# Patient Record
Sex: Female | Born: 1955 | Race: White | Hispanic: No | Smoking: Current some day smoker
Health system: Southern US, Community
[De-identification: ages and names within clinical notes are randomized; demographics above are authoritative.]

## PROBLEM LIST (undated history)

## (undated) DIAGNOSIS — D649 Anemia, unspecified: Secondary | ICD-10-CM

## (undated) DIAGNOSIS — M1990 Unspecified osteoarthritis, unspecified site: Secondary | ICD-10-CM

## (undated) DIAGNOSIS — K219 Gastro-esophageal reflux disease without esophagitis: Secondary | ICD-10-CM

## (undated) DIAGNOSIS — E039 Hypothyroidism, unspecified: Secondary | ICD-10-CM

## (undated) DIAGNOSIS — F418 Other specified anxiety disorders: Secondary | ICD-10-CM

## (undated) DIAGNOSIS — I10 Essential (primary) hypertension: Secondary | ICD-10-CM

## (undated) DIAGNOSIS — E785 Hyperlipidemia, unspecified: Secondary | ICD-10-CM

## (undated) DIAGNOSIS — D126 Benign neoplasm of colon, unspecified: Secondary | ICD-10-CM

## (undated) DIAGNOSIS — F32A Depression, unspecified: Secondary | ICD-10-CM

## (undated) DIAGNOSIS — I739 Peripheral vascular disease, unspecified: Secondary | ICD-10-CM

## (undated) DIAGNOSIS — N189 Chronic kidney disease, unspecified: Secondary | ICD-10-CM

## (undated) DIAGNOSIS — E079 Disorder of thyroid, unspecified: Secondary | ICD-10-CM

## (undated) DIAGNOSIS — G5600 Carpal tunnel syndrome, unspecified upper limb: Secondary | ICD-10-CM

## (undated) DIAGNOSIS — E669 Obesity, unspecified: Secondary | ICD-10-CM

## (undated) DIAGNOSIS — I2510 Atherosclerotic heart disease of native coronary artery without angina pectoris: Secondary | ICD-10-CM

## (undated) DIAGNOSIS — F329 Major depressive disorder, single episode, unspecified: Secondary | ICD-10-CM

## (undated) DIAGNOSIS — R011 Cardiac murmur, unspecified: Secondary | ICD-10-CM

## (undated) DIAGNOSIS — Z8719 Personal history of other diseases of the digestive system: Secondary | ICD-10-CM

## (undated) DIAGNOSIS — K5730 Diverticulosis of large intestine without perforation or abscess without bleeding: Secondary | ICD-10-CM

## (undated) DIAGNOSIS — I779 Disorder of arteries and arterioles, unspecified: Secondary | ICD-10-CM

## (undated) HISTORY — DX: Other specified anxiety disorders: F41.8

## (undated) HISTORY — PX: CHOLECYSTECTOMY: SHX55

## (undated) HISTORY — PX: APPENDECTOMY: SHX54

## (undated) HISTORY — DX: Carpal tunnel syndrome, unspecified upper limb: G56.00

## (undated) HISTORY — DX: Depression, unspecified: F32.A

## (undated) HISTORY — DX: Diverticulosis of large intestine without perforation or abscess without bleeding: K57.30

## (undated) HISTORY — DX: Obesity, unspecified: E66.9

## (undated) HISTORY — DX: Essential (primary) hypertension: I10

## (undated) HISTORY — DX: Benign neoplasm of colon, unspecified: D12.6

## (undated) HISTORY — DX: Atherosclerotic heart disease of native coronary artery without angina pectoris: I25.10

## (undated) HISTORY — PX: ABDOMINAL HYSTERECTOMY: SHX81

## (undated) HISTORY — DX: Peripheral vascular disease, unspecified: I73.9

## (undated) HISTORY — DX: Hyperlipidemia, unspecified: E78.5

## (undated) HISTORY — DX: Gastro-esophageal reflux disease without esophagitis: K21.9

## (undated) HISTORY — DX: Anemia, unspecified: D64.9

## (undated) HISTORY — DX: Disorder of thyroid, unspecified: E07.9

## (undated) HISTORY — DX: Chronic kidney disease, unspecified: N18.9

## (undated) HISTORY — DX: Major depressive disorder, single episode, unspecified: F32.9

---

## 1998-10-12 ENCOUNTER — Other Ambulatory Visit: Admission: RE | Admit: 1998-10-12 | Discharge: 1998-10-12 | Payer: Self-pay | Admitting: *Deleted

## 1999-10-25 ENCOUNTER — Encounter: Admission: RE | Admit: 1999-10-25 | Discharge: 1999-10-25 | Payer: Self-pay | Admitting: *Deleted

## 1999-10-25 ENCOUNTER — Encounter: Payer: Self-pay | Admitting: *Deleted

## 2000-10-26 ENCOUNTER — Encounter: Admission: RE | Admit: 2000-10-26 | Discharge: 2000-10-26 | Payer: Self-pay | Admitting: *Deleted

## 2000-10-26 ENCOUNTER — Encounter: Payer: Self-pay | Admitting: *Deleted

## 2001-01-15 ENCOUNTER — Ambulatory Visit (HOSPITAL_COMMUNITY): Admission: RE | Admit: 2001-01-15 | Discharge: 2001-01-15 | Payer: Self-pay | Admitting: Gynecology

## 2001-01-31 ENCOUNTER — Encounter: Admission: RE | Admit: 2001-01-31 | Discharge: 2001-01-31 | Payer: Self-pay | Admitting: *Deleted

## 2001-01-31 ENCOUNTER — Encounter: Payer: Self-pay | Admitting: *Deleted

## 2001-09-10 ENCOUNTER — Other Ambulatory Visit: Admission: RE | Admit: 2001-09-10 | Discharge: 2001-09-10 | Payer: Self-pay | Admitting: *Deleted

## 2001-12-21 ENCOUNTER — Encounter: Admission: RE | Admit: 2001-12-21 | Discharge: 2001-12-21 | Payer: Self-pay | Admitting: *Deleted

## 2001-12-21 ENCOUNTER — Encounter: Payer: Self-pay | Admitting: *Deleted

## 2001-12-28 ENCOUNTER — Encounter: Admission: RE | Admit: 2001-12-28 | Discharge: 2001-12-28 | Payer: Self-pay | Admitting: *Deleted

## 2001-12-28 ENCOUNTER — Encounter: Payer: Self-pay | Admitting: *Deleted

## 2002-01-08 ENCOUNTER — Encounter: Payer: Self-pay | Admitting: Orthopedic Surgery

## 2002-01-15 ENCOUNTER — Ambulatory Visit (HOSPITAL_COMMUNITY): Admission: RE | Admit: 2002-01-15 | Discharge: 2002-01-15 | Payer: Self-pay | Admitting: Orthopedic Surgery

## 2002-06-11 ENCOUNTER — Ambulatory Visit (HOSPITAL_COMMUNITY): Admission: RE | Admit: 2002-06-11 | Discharge: 2002-06-12 | Payer: Self-pay | Admitting: Cardiovascular Disease

## 2002-07-02 ENCOUNTER — Ambulatory Visit (HOSPITAL_COMMUNITY): Admission: RE | Admit: 2002-07-02 | Discharge: 2002-07-02 | Payer: Self-pay | Admitting: Cardiovascular Disease

## 2002-07-05 ENCOUNTER — Ambulatory Visit (HOSPITAL_COMMUNITY): Admission: RE | Admit: 2002-07-05 | Discharge: 2002-07-05 | Payer: Self-pay | Admitting: Cardiovascular Disease

## 2002-07-11 HISTORY — PX: CORONARY ARTERY BYPASS GRAFT: SHX141

## 2002-08-12 ENCOUNTER — Encounter: Payer: Self-pay | Admitting: *Deleted

## 2002-08-12 ENCOUNTER — Inpatient Hospital Stay (HOSPITAL_COMMUNITY): Admission: EM | Admit: 2002-08-12 | Discharge: 2002-08-26 | Payer: Self-pay | Admitting: Podiatry

## 2002-08-14 ENCOUNTER — Encounter: Payer: Self-pay | Admitting: Cardiology

## 2002-08-20 ENCOUNTER — Encounter: Payer: Self-pay | Admitting: Cardiothoracic Surgery

## 2002-08-21 ENCOUNTER — Encounter: Payer: Self-pay | Admitting: Cardiothoracic Surgery

## 2002-08-22 ENCOUNTER — Encounter: Payer: Self-pay | Admitting: Cardiothoracic Surgery

## 2002-08-23 ENCOUNTER — Encounter: Payer: Self-pay | Admitting: Cardiothoracic Surgery

## 2003-10-02 ENCOUNTER — Other Ambulatory Visit: Admission: RE | Admit: 2003-10-02 | Discharge: 2003-10-02 | Payer: Self-pay | Admitting: Family Medicine

## 2004-05-07 ENCOUNTER — Ambulatory Visit (HOSPITAL_COMMUNITY): Admission: RE | Admit: 2004-05-07 | Discharge: 2004-05-07 | Payer: Self-pay | Admitting: Cardiology

## 2004-12-17 ENCOUNTER — Other Ambulatory Visit: Admission: RE | Admit: 2004-12-17 | Discharge: 2004-12-17 | Payer: Self-pay | Admitting: Family Medicine

## 2004-12-23 ENCOUNTER — Ambulatory Visit: Payer: Self-pay | Admitting: Internal Medicine

## 2005-01-03 ENCOUNTER — Encounter: Admission: RE | Admit: 2005-01-03 | Discharge: 2005-01-03 | Payer: Self-pay | Admitting: Family Medicine

## 2005-01-05 ENCOUNTER — Ambulatory Visit: Payer: Self-pay | Admitting: Cardiology

## 2005-01-12 ENCOUNTER — Ambulatory Visit: Payer: Self-pay | Admitting: Cardiology

## 2005-02-08 ENCOUNTER — Encounter: Payer: Self-pay | Admitting: Internal Medicine

## 2005-02-08 ENCOUNTER — Encounter (INDEPENDENT_AMBULATORY_CARE_PROVIDER_SITE_OTHER): Payer: Self-pay | Admitting: Specialist

## 2005-02-08 ENCOUNTER — Ambulatory Visit: Payer: Self-pay | Admitting: Internal Medicine

## 2005-02-08 ENCOUNTER — Ambulatory Visit: Payer: Self-pay | Admitting: Cardiology

## 2005-02-14 ENCOUNTER — Ambulatory Visit: Payer: Self-pay | Admitting: Cardiology

## 2005-03-07 ENCOUNTER — Ambulatory Visit: Payer: Self-pay | Admitting: Cardiology

## 2005-04-12 ENCOUNTER — Ambulatory Visit: Payer: Self-pay | Admitting: Internal Medicine

## 2005-06-16 ENCOUNTER — Ambulatory Visit: Payer: Self-pay | Admitting: Cardiology

## 2005-10-26 ENCOUNTER — Ambulatory Visit: Payer: Self-pay | Admitting: Cardiology

## 2006-01-27 ENCOUNTER — Encounter: Admission: RE | Admit: 2006-01-27 | Discharge: 2006-01-27 | Payer: Self-pay | Admitting: Family Medicine

## 2007-02-16 ENCOUNTER — Encounter: Admission: RE | Admit: 2007-02-16 | Discharge: 2007-02-16 | Payer: Self-pay | Admitting: Family Medicine

## 2007-05-07 ENCOUNTER — Encounter: Admission: RE | Admit: 2007-05-07 | Discharge: 2007-05-07 | Payer: Self-pay | Admitting: Family Medicine

## 2007-06-26 ENCOUNTER — Ambulatory Visit: Payer: Self-pay | Admitting: Cardiology

## 2007-06-28 ENCOUNTER — Ambulatory Visit: Payer: Self-pay

## 2008-02-18 ENCOUNTER — Encounter: Admission: RE | Admit: 2008-02-18 | Discharge: 2008-02-18 | Payer: Self-pay | Admitting: Family Medicine

## 2008-03-10 DIAGNOSIS — I2510 Atherosclerotic heart disease of native coronary artery without angina pectoris: Secondary | ICD-10-CM

## 2008-03-10 DIAGNOSIS — F341 Dysthymic disorder: Secondary | ICD-10-CM

## 2008-03-10 DIAGNOSIS — I739 Peripheral vascular disease, unspecified: Secondary | ICD-10-CM | POA: Insufficient documentation

## 2008-03-10 DIAGNOSIS — D126 Benign neoplasm of colon, unspecified: Secondary | ICD-10-CM

## 2008-03-10 DIAGNOSIS — K5730 Diverticulosis of large intestine without perforation or abscess without bleeding: Secondary | ICD-10-CM

## 2008-03-10 DIAGNOSIS — E119 Type 2 diabetes mellitus without complications: Secondary | ICD-10-CM

## 2008-03-10 DIAGNOSIS — E785 Hyperlipidemia, unspecified: Secondary | ICD-10-CM | POA: Insufficient documentation

## 2008-03-11 ENCOUNTER — Ambulatory Visit: Payer: Self-pay | Admitting: Internal Medicine

## 2008-03-11 DIAGNOSIS — K219 Gastro-esophageal reflux disease without esophagitis: Secondary | ICD-10-CM | POA: Insufficient documentation

## 2008-03-25 ENCOUNTER — Encounter: Payer: Self-pay | Admitting: Internal Medicine

## 2008-03-25 ENCOUNTER — Ambulatory Visit: Payer: Self-pay | Admitting: Internal Medicine

## 2008-03-26 ENCOUNTER — Encounter: Payer: Self-pay | Admitting: Internal Medicine

## 2008-10-02 DIAGNOSIS — I10 Essential (primary) hypertension: Secondary | ICD-10-CM

## 2008-10-03 ENCOUNTER — Ambulatory Visit: Payer: Self-pay | Admitting: Cardiology

## 2008-10-08 ENCOUNTER — Ambulatory Visit: Payer: Self-pay

## 2009-03-03 ENCOUNTER — Encounter: Payer: Self-pay | Admitting: Cardiology

## 2009-03-06 ENCOUNTER — Emergency Department (HOSPITAL_COMMUNITY): Admission: EM | Admit: 2009-03-06 | Discharge: 2009-03-06 | Payer: Self-pay | Admitting: Emergency Medicine

## 2009-03-31 ENCOUNTER — Encounter: Admission: RE | Admit: 2009-03-31 | Discharge: 2009-03-31 | Payer: Self-pay | Admitting: Family Medicine

## 2009-05-07 ENCOUNTER — Encounter: Admission: RE | Admit: 2009-05-07 | Discharge: 2009-05-07 | Payer: Self-pay | Admitting: Family Medicine

## 2009-08-31 ENCOUNTER — Telehealth: Payer: Self-pay | Admitting: Cardiology

## 2009-10-08 ENCOUNTER — Encounter: Payer: Self-pay | Admitting: Cardiology

## 2009-10-08 DIAGNOSIS — I6523 Occlusion and stenosis of bilateral carotid arteries: Secondary | ICD-10-CM | POA: Insufficient documentation

## 2009-10-09 ENCOUNTER — Encounter: Payer: Self-pay | Admitting: Cardiology

## 2009-10-09 ENCOUNTER — Ambulatory Visit: Payer: Self-pay

## 2009-10-12 ENCOUNTER — Telehealth (INDEPENDENT_AMBULATORY_CARE_PROVIDER_SITE_OTHER): Payer: Self-pay | Admitting: *Deleted

## 2009-10-27 ENCOUNTER — Ambulatory Visit: Payer: Self-pay | Admitting: Cardiology

## 2009-10-27 DIAGNOSIS — N259 Disorder resulting from impaired renal tubular function, unspecified: Secondary | ICD-10-CM

## 2009-11-18 ENCOUNTER — Telehealth: Payer: Self-pay | Admitting: Cardiology

## 2010-01-13 ENCOUNTER — Telehealth (INDEPENDENT_AMBULATORY_CARE_PROVIDER_SITE_OTHER): Payer: Self-pay | Admitting: *Deleted

## 2010-05-07 ENCOUNTER — Encounter: Admission: RE | Admit: 2010-05-07 | Discharge: 2010-05-07 | Payer: Self-pay | Admitting: Family Medicine

## 2010-05-11 ENCOUNTER — Encounter: Payer: Self-pay | Admitting: Cardiology

## 2010-08-10 NOTE — Assessment & Plan Note (Signed)
Summary: 1 yr/dmp      Allergies Added: NKDA  Visit Type:  Follow-up Primary Provider:  Myna Hidalgo Mozzicchi,MD  CC:  no complaints.  History of Present Illness: [name] is a pleasant female who has a history of coronary artery status post coronary bypass and graft in 2004.  Her last Myoview was performed on June 28, 2007.  At that time, her ejection fraction was 66%.  The perfusion was normal.  Carotid Dopplers in April of 2011 showed 40-59% bilateral stenosis. Followup was recommended in one year. Patient also has renal insufficiency. A recent LDL in March of 2011 was 106. I last saw her in March of 2010. Since then the patient has dyspnea with more extreme activities but not with routine activities. It is relieved with rest. It is not associated with chest pain. There is no orthopnea, PND or pedal edema. There is no syncope or palpitations. There is no exertional chest pain.   Current Medications (verified): 1)  Lipitor 80 Mg Tabs (Atorvastatin Calcium) .Marland Kitchen.. 1 Tablet By Mouth Once Daily 2)  Benicar 40 Mg Tabs (Olmesartan Medoxomil) .Marland Kitchen.. 1 Tablet By Mouth Once Daily 3)  Fenofibrate Micronized 200 Mg Caps (Fenofibrate Micronized) .Marland Kitchen.. 1 Tablet By Mouth Once Daily 4)  Omeprazole 20 Mg Cpdr (Omeprazole) .... One Tablet By Mouth Once Daily 5)  Aspirin 81 Mg  Tabs (Aspirin) .... One Tablet By Mouth Once Daily 6)  Metoprolol Tartrate 50 Mg Tabs (Metoprolol Tartrate) .... Take One Tablet By Mouth Twice A Day  Allergies (verified): No Known Drug Allergies  Past History:  Past Medical History: Reviewed history from 10/02/2008 and no changes required. Current Problems:  HYPERTENSION (ICD-401.9) GERD (ICD-530.81) DIABETES MELLITUS-TYPE II (ICD-250.00) DIVERTICULOSIS, COLON (ICD-562.10) COLONIC POLYPS (ICD-211.3) ANXIETY DEPRESSION (ICD-300.4) PVD (ICD-443.9) DIABETES MELLITUS (ICD-250.00) HYPERLIPIDEMIA (ICD-272.4) CAD (ICD-414.00)   Carpal tunnel syndrome.  Past Surgical  History: Reviewed history from 10/02/2008 and no changes required. cholecystectomy hysterectomy CABG x 4 appendectomy knee surgery.  Social History: Reviewed history from 10/02/2008 and no changes required. Patient is a former smoker.  Alcohol Use - no Daily Caffeine Use Illicit Drug Use - no Widowed   Review of Systems       Some problems with leg cramping but no fevers or chills, productive cough, hemoptysis, dysphasia, odynophagia, melena, hematochezia, dysuria, hematuria, rash, seizure activity, orthopnea, PND, pedal edema. Remaining systems are negative.   Vital Signs:  Patient profile:   55 year old female Height:      65 inches Weight:      223 pounds BMI:     37.24 Pulse rate:   68 / minute BP sitting:   116 / 64  (left arm)  Vitals Entered By: Lubertha Basque, CNA (October 27, 2009 9:18 AM)  Physical Exam  General:  Well-developed well-nourished in no acute distress.  Skin is warm and dry.  HEENT is normal.  Neck is supple. No thyromegaly. bilateral carotid bruits Chest is clear to auscultation with normal expansion.  Cardiovascular exam is regular rate and rhythm.  Abdominal exam nontender or distended. No masses palpated. Extremities show no edema. neuro grossly intact    EKG  Procedure date:  10/27/2009  Findings:      Normal sinus rhythm at a rate of 68. Axis normal. RV conduction delay. Nonspecific ST changes.  Impression & Recommendations:  Problem # 1:  CAROTID ARTERY DISEASE (ICD-433.10) Continue aspirin and statin. Followup carotid Dopplers April 2012. Her updated medication list for this problem includes:  Aspirin 81 Mg Tabs (Aspirin) ..... One tablet by mouth once daily  Problem # 2:  HYPERTENSION (ICD-401.9) Blood pressure controlled on present medications. Will continue. Renal function and potassium monitored by primary care and nephrology. Her updated medication list for this problem includes:    Benicar 40 Mg Tabs (Olmesartan  medoxomil) .Marland Kitchen... 1 tablet by mouth once daily    Aspirin 81 Mg Tabs (Aspirin) ..... One tablet by mouth once daily    Metoprolol Tartrate 50 Mg Tabs (Metoprolol tartrate) .Marland Kitchen... Take one tablet by mouth twice a day  Problem # 3:  DIABETES MELLITUS-TYPE II (ICD-250.00) Management per primary care. Her updated medication list for this problem includes:    Benicar 40 Mg Tabs (Olmesartan medoxomil) .Marland Kitchen... 1 tablet by mouth once daily    Aspirin 81 Mg Tabs (Aspirin) ..... One tablet by mouth once daily  Problem # 4:  HYPERLIPIDEMIA (ICD-272.4) Continue present medications. Lipids and liver monitor by primary care. I discussed the importance of diet. Her updated medication list for this problem includes:    Lipitor 80 Mg Tabs (Atorvastatin calcium) .Marland Kitchen... 1 tablet by mouth once daily    Fenofibrate Micronized 200 Mg Caps (Fenofibrate micronized) .Marland Kitchen... 1 tablet by mouth once daily  Problem # 5:  CAD (ICD-414.00) Continue aspirin, beta blocker and statin. Continue risk factor modification. Followup Myoview in one year when she returns. Her updated medication list for this problem includes:    Aspirin 81 Mg Tabs (Aspirin) ..... One tablet by mouth once daily    Metoprolol Tartrate 50 Mg Tabs (Metoprolol tartrate) .Marland Kitchen... Take one tablet by mouth twice a day  Problem # 6:  PVD (ICD-443.9) Continue aspirin and statin.  Problem # 7:  RENAL INSUFFICIENCY (ICD-588.9) Followed by nephrology.  Patient Instructions: 1)  Your physician recommends that you schedule a follow-up appointment in: [place]

## 2010-08-10 NOTE — Progress Notes (Signed)
Summary: ROI request  Records request received from the fax machine. Forwarded to [facility] for processing. 

## 2010-08-10 NOTE — Progress Notes (Signed)
Summary: refill   Phone Note Refill Request   Refills Requested: Medication #1:  FENOFIBRATE MICRONIZED 200 MG CAPS 1 tablet by mouth once daily   [facility] [address]   Method Requested: Fax to [place] Initial call taken by: Darnell Level,  Nov 18, 2009 3:28 PM    Prescriptions: FENOFIBRATE MICRONIZED 200 MG CAPS (FENOFIBRATE MICRONIZED) 1 tablet by mouth once daily  #90 x 3   Entered by:   Burnett Kanaris   Authorized by:   Colin Mulders, MD, [facility]   Signed by:   Burnett Kanaris on 11/18/2009   Method used:   [facility]        [phone]       [address]       [place], [place]  [address]       Ph: [phone]       Fax: [phone]   RxID:   [id number]

## 2010-08-10 NOTE — Progress Notes (Signed)
Summary: refill meds  Medications Added METOPROLOL TARTRATE 50 MG TABS (METOPROLOL TARTRATE) Take one tablet by mouth twice a day       Phone Note Refill Request Call back at Home Phone [phone] Message from:  Patient on August 31, 2009 3:39 PM  metoprolol 50 mg twice a day, [facility] [address]    Method Requested: Fax to [place] Initial call taken by: Neil Crouch,  August 31, 2009 3:40 PM    New/Updated Medications: METOPROLOL TARTRATE 50 MG TABS (METOPROLOL TARTRATE) Take one tablet by mouth twice a day Prescriptions: METOPROLOL TARTRATE 50 MG TABS (METOPROLOL TARTRATE) Take one tablet by mouth twice a day  #60 x 12   Entered by:   Burnett Kanaris   Authorized by:   Colin Mulders, MD, [facility]   Signed by:   Burnett Kanaris on 09/01/2009   Method used:   [facility]        [facility] [place] [phone][facility] (retail[facility]       [address]       [place], [place]  [address]       Ph: [phone]       Fax: [phone]   RxID:   [id number]

## 2010-08-10 NOTE — Miscellaneous (Signed)
Summary: Orders Update  Clinical Lists Changes  Problems: Added new problem of CAROTID ARTERY DISEASE (ICD-433.10) Orders: Added new Test order of Carotid Duplex (Carotid Duplex) - Signed 

## 2010-08-10 NOTE — Progress Notes (Signed)
   Walk in Patient Form Recieved " Pt left Dept Of  Transportation papers to be completed" sent to [place]  January 13, 2010 8:47 AM

## 2010-08-10 NOTE — Letter (Signed)
Summary: [place] - Walk-In Pt Form  [place] - Walk-In Pt Form   Imported By: Marilynne Drivers 06/08/2010 15:05:27  _____________________________________________________________________  External Attachment:    Type:   Image     Comment:   External Document

## 2010-08-17 ENCOUNTER — Ambulatory Visit (INDEPENDENT_AMBULATORY_CARE_PROVIDER_SITE_OTHER): Payer: BC Managed Care – PPO | Admitting: Family Medicine

## 2010-08-17 ENCOUNTER — Encounter: Payer: Self-pay | Admitting: Family Medicine

## 2010-08-17 DIAGNOSIS — E119 Type 2 diabetes mellitus without complications: Secondary | ICD-10-CM

## 2010-08-17 DIAGNOSIS — I6529 Occlusion and stenosis of unspecified carotid artery: Secondary | ICD-10-CM

## 2010-08-17 DIAGNOSIS — J209 Acute bronchitis, unspecified: Secondary | ICD-10-CM

## 2010-08-17 DIAGNOSIS — J019 Acute sinusitis, unspecified: Secondary | ICD-10-CM

## 2010-08-17 DIAGNOSIS — N259 Disorder resulting from impaired renal tubular function, unspecified: Secondary | ICD-10-CM

## 2010-08-21 ENCOUNTER — Telehealth (INDEPENDENT_AMBULATORY_CARE_PROVIDER_SITE_OTHER): Payer: Self-pay | Admitting: *Deleted

## 2010-08-26 NOTE — Progress Notes (Signed)
  Phone Note Call from Patient   Caller: Patient Summary of Call: Patient came to the store to shop and she stopped by to see if Dr. Wynetta Emery could prescribe her something else other than the Delsym prescription he prescribed to her on August 17, 2010 when she came to see him in the office.  You would like for you to [facility] [address].  The number for the pharmacy is [phone].  You can contact the patient at [phone] Initial call taken by: Boykin Reaper,  August 21, 2010 3:08 PM  Follow-up for Phone Call        OK we will try something different this time but let the patient know that if there is no improvement in 1-2 days she will need to return for a new office visit for an evaluation or see her primary care provider for an evaluation.   Follow-up by: Irwin Brakeman MD,  August 21, 2010 5:23 PM    New/Updated Medications: BENZONATATE 100 MG CAPS (BENZONATATE) take 1 by mouth three times a day as needed for severe cough. Swallow whole, don't chew, May cause drowsiness. Prescriptions: BENZONATATE 100 MG CAPS (BENZONATATE) take 1 by mouth three times a day as needed for severe cough. Swallow whole, don't chew, May cause drowsiness.  #12 x 0   Entered and Authorized by:   Irwin Brakeman MD   Signed by:   Irwin Brakeman MD on 08/21/2010   Method used:   [facility]        [facility] [place] [id number] (retail)       [address]       [place], [place]  [address]       Ph: [phone]       Fax: [phone]   RxID:   [id number]

## 2010-08-26 NOTE — Letter (Signed)
Summary: Out of Work  [facility]   [place], [place] [address]   Phone: [phone]  Fax: [phone]    August 17, 2010   Employee:  JENCIE CUADRAS    To Whom It May Concern:   For Medical reasons, please excuse the above named employee from work for the following dates:  Start:   August 16, 2010    End:   August 23, 2010  If you need additional information, please feel free to contact our office.         Sincerely,    Irwin Brakeman MD

## 2010-08-26 NOTE — Assessment & Plan Note (Signed)
Summary: BRONCHITIS INFECTION/EVM   Vital Signs:  Patient Profile:   55 Years Old Female CC:      Cold & URI symptoms Height:     63.5 inches Weight:      230 pounds BMI:     38.41 O2 Sat:      97 % O2 treatment:    Room Air Temp:     98.1 degrees F oral Pulse rate:   70 / minute Pulse rhythm:   regular Resp:     20 per minute BP sitting:   126 / 60  (right arm)  Pt. in pain?   no  Vitals Entered By: Brent Bulla EMT-P (August 17, 2010 11:31 AM)              Is Patient Diabetic? No      Current Allergies: No known allergies History of Present Illness History from: patient Chief Complaint: Cold & URI symptoms History of Present Illness: The patient presented today because she has had some cough and congestion and wheezing for 1 week.  She is having some SOB and thick yellow, green chest and nasal congestion.  She has CAD s/p CABG, T2DM, Chronic Renal Failure, and reports that she had a flu and pneumonia vaccine.  She says that she is wheezing throughout the day, not saying worse at the night.  She is reporting that she had a blood glucose of 116 this morning.  She denies CP.  She denies changes in weight and vomiting.  No rash reported.  She says that she has taken the Zpack in the past and tolerated it well.  She has bronchitis conditions and has a home nebulizer at home but has not been using it.    REVIEW OF SYSTEMS Constitutional Symptoms      Denies fever, chills, night sweats, weight loss, weight gain, and fatigue.  Eyes       Denies change in vision, eye pain, eye discharge, glasses, contact lenses, and eye surgery. Ear/Nose/Throat/Mouth       Complains of frequent runny nose, sinus problems, and hoarseness.      Denies hearing loss/aids, change in hearing, ear pain, ear discharge, dizziness, frequent nose bleeds, sore throat, and tooth pain or bleeding.  Respiratory       Complains of productive cough, wheezing, shortness of breath, asthma, and bronchitis.       Denies dry cough and emphysema/COPD.      Comments: Colored Sputum Cardiovascular       Denies murmurs, chest pain, and tires easily with exhertion.    Gastrointestinal       Denies stomach pain, nausea/vomiting, diarrhea, constipation, blood in bowel movements, and indigestion. Genitourniary       Denies painful urination, blood or discharge from vagina, kidney stones, and loss of urinary control. Neurological       Denies paralysis, seizures, and fainting/blackouts. Musculoskeletal       Denies muscle pain, joint pain, joint stiffness, decreased range of motion, redness, swelling, muscle weakness, and gout.  Skin       Denies bruising, unusual mles/lumps or sores, and hair/skin or nail changes.  Psych       Denies mood changes, temper/anger issues, anxiety/stress, speech problems, depression, and sleep problems.  Past History:  Past Surgical History: Last updated: 10/02/2008 cholecystectomy hysterectomy CABG x 4 appendectomy knee surgery.  Family History: Last updated: 08/17/2010 Family History of Diabetes Mellitus: Mother Family History of Heart Disease: Father  Social History: Last updated: 08/17/2010  Patient is a former long-time smoker.  Alcohol Use - no Daily Caffeine Use Illicit Drug Use - no Widowed   Risk Factors: Smoking Status: quit (03/11/2008)  Past Medical History: Current Problems:  HYPERTENSION (ICD-401.9) GERD (ICD-530.81) Chronic Renal Insufficiency EGFR - 20  DIABETES MELLITUS-TYPE II (ICD-250.00), Non insulin requiring DIVERTICULOSIS, COLON (ICD-562.10) COLONIC POLYPS (ICD-211.3) ANXIETY DEPRESSION (ICD-300.4) PVD (ICD-443.9) DIABETES MELLITUS (ICD-250.00) HYPERLIPIDEMIA (ICD-272.4) CAD (ICD-414.00) s/p CABG   Carpal tunnel syndrome.  Family History: Family History of Diabetes Mellitus: Mother Family History of Heart Disease: Father  Social History: Patient is a former long-time smoker.  Alcohol Use - no Daily Caffeine  Use Illicit Drug Use - no Widowed  Physical Exam General appearance: well developed, well nourished, no acute distress Head: normocephalic, atraumatic Eyes: conjunctivae and lids normal Pupils: equal, round, reactive to light Ears: normal, no lesions or deformities Nasal: swollen nasal turbinates, mucosa pink, nonedematous, no septal deviation Oral/Pharynx: tongue normal, posterior pharynx without erythema or exudate Neck: neck supple,  trachea midline, no masses Chest/Lungs: no rales, bilateral expiratory wheezes, no rhonchi, breath sounds equal without effort Heart: regular rate and  rhythm, no murmur Abdomen: soft, non-tender without obvious organomegaly Extremities: normal extremities Neurological: grossly intact and non-focal Skin: no obvious rashes or lesions MSE: oriented to time, place, and person Assessment  Assessed DIABETES MELLITUS-TYPE II as unchanged - Irwin Brakeman MD Assessed RENAL INSUFFICIENCY as unchanged - Irwin Brakeman MD Assessed CAROTID ARTERY DISEASE as unchanged - Irwin Brakeman MD New Problems: ACUTE SINUSITIS, UNSPECIFIED (ICD-461.9) ACUTE BRONCHITIS (ICD-466.0)   Patient Education: Patient and/or caregiver instructed in the following: rest, fluids. The risks, benefits and possible side effects were clearly explained and discussed with the patient.  The patient verbalized clear understanding.  The patient was given instructions to return if symptoms don't improve, worsen or new changes develop.  If it is not during clinic hours and the patient cannot get back to this clinic then the patient was told to seek medical care at an available urgent care or emergency department.  The patient verbalized understanding.   Demonstrates willingness to comply.  Plan New Medications/Changes: DELSYM 30 MG/5ML LQCR (DEXTROMETHORPHAN POLISTIREX) take 1 teaspoon by mouth every 12 hours as needed for coughing  #50 mL x 0, 08/17/2010, Tessi Eustache  MD AZITHROMYCIN 250 MG TABS (AZITHROMYCIN) take 2 tabs by mouth on day 1, then take 1 tab by mouth daily until completed  #6 x 0, 08/17/2010, Tauheedah Bok MD DELSYM 30 MG/5ML LQCR (DEXTROMETHORPHAN POLISTIREX) take 1 teaspoon by mouth every 12 hours as needed for coughing  #50 mL x 0, 08/17/2010, Larin Depaoli MD AZITHROMYCIN 250 MG TABS (AZITHROMYCIN) take 2 tabs by mouth on day 1, then take 1 tab by mouth daily until completed  #6 x 0, 08/17/2010, Ailey Wessling MD  Follow Up: Follow up in 2-3 days if no improvement, Follow up on an as needed basis, Follow up with Primary Physician  The patient and/or caregiver has been counseled thoroughly with regard to medications prescribed including dosage, schedule, interactions, rationale for use, and possible side effects and they verbalize understanding.  Diagnoses and expected course of recovery discussed and will return if not improved as expected or if the condition worsens. Patient and/or caregiver verbalized understanding.  Prescriptions: DELSYM 30 MG/5ML LQCR (DEXTROMETHORPHAN POLISTIREX) take 1 teaspoon by mouth every 12 hours as needed for coughing  #50 mL x 0   Entered and Authorized by:   Irwin Brakeman MD   Signed by:   Tery Sanfilippo  Jaidence Geisler MD on 08/17/2010   Method used:   [facility] ...       [facility]  [address] [id number]       [address]       [place], [place]  [address]       Ph: [phone]       Fax: [phone]   RxID:   [id number] AZITHROMYCIN 250 MG TABS (AZITHROMYCIN) take 2 tabs by mouth on day 1, then take 1 tab by mouth daily until completed  #6 x 0   Entered and Authorized by:   Irwin Brakeman MD   Signed by:   Irwin Brakeman MD on 08/17/2010   Method used:   Faxed to ...       [id number]       [address]       [place], [place]  [address]       Ph: [phone]       Fax: [phone]   RxID:   [id number] DELSYM 30  MG/5ML LQCR (DEXTROMETHORPHAN POLISTIREX) take 1 teaspoon by mouth every 12 hours as needed for coughing  #50 mL x 0   Entered and Authorized by:   Irwin Brakeman MD   Signed by:   Irwin Brakeman MD on 08/17/2010   Method used:   [facility]        [facility] [place] [id number] ([facility]ail[facility]       [address]       [place], [place]  [address]       Ph: [phone]       Fax: [phone]   RxID:   [id number] AZITHROMYCIN 250 MG TABS (AZITHROMYCIN) take 2 tabs by mouth on day 1, then take 1 tab by mouth daily until completed  #6 x 0   Entered and Authorized by:   Irwin Brakeman MD   Signed by:   Irwin Brakeman MD on 08/17/2010   Method used:   [facility] to        [facility] [place] [id number]retail[facility]       [address]       [place], [place]  [address]       Ph: [phone]       Fax: [phone]   [place]:   [phone]   Patient Instructions: 1)  Go to the pharmacy and pick up your prescription (s).  It may take up to 30 mins for electronic prescriptions to be delivered to the pharmacy.  Please call if your pharmacy has not received your prescriptions after 30 minutes.   2)  The patient's prescriptions were checked for possible interactions and electronically sent to the pharmacy of choice.   3)  Return or go to the ER if no improvement or symptoms getting worse.   4)  Take your antibiotic as prescribed until ALL of it is gone, but stop if you develop a rash or swelling and contact our office as soon as possible. 5)  Acute sinusitis symptoms for less than 10 days are not helped by antibiotics.Use warm moist compresses, and over the counter decongestants ( only as directed). Call if no improvement in 5-7 days, sooner if increasing pain, fever, or new symptoms. 6)  Acute bronchitis symptoms for less than  10 days are not helped by antibiotics. take over the counter cough medications. call if no improvment in  5-7 days, sooner if  increasing cough, fever, or new symptoms( shortness of breath, chest pain). 7)  The patient was informed that there is no on-call provider or services available at this clinic during off-hours (when the clinic is closed).  If the patient developed a problem or concern that required immediate attention, the patient was advised to go the the nearest available urgent care or emergency department for medical care.  The patient verbalized understanding.     Medication Administration  Injection # 1:    Medication: Depo- Medrol 80mg     Diagnosis: Bronchitis    Route: IM    Site: RUOQ gluteus    Exp Date: 02/08/2011    Lot #: [id number]    Patient tolerated injection without complications    Given by: Brent Bulla EMT-P (August 17, 2010 12:02 PM)  Medication # 1:    Medication: Albuterol Sulfate Sol 2.5mg  unit dose    Diagnosis: Bronchitis    Dose: 1    Route: inhaled    Exp Date: 07/11/2011    Lot #: [id number]    Patient tolerated medication without complications    Given by: Brent Bulla EMT-P (August 17, 2010 12:03 PM)

## 2010-10-16 LAB — POCT I-STAT, CHEM 8
BUN: 68 mg/dL — ABNORMAL HIGH (ref 6–23)
Calcium, Ion: 1.07 mmol/L — ABNORMAL LOW (ref 1.12–1.32)
Hemoglobin: 13.3 g/dL (ref 12.0–15.0)
Sodium: 133 mEq/L — ABNORMAL LOW (ref 135–145)
TCO2: 23 mmol/L (ref 0–100)

## 2010-11-08 ENCOUNTER — Encounter: Payer: Self-pay | Admitting: Cardiology

## 2010-11-09 ENCOUNTER — Encounter: Payer: BC Managed Care – PPO | Admitting: Cardiology

## 2010-11-09 ENCOUNTER — Encounter: Payer: Self-pay | Admitting: Cardiology

## 2010-11-09 NOTE — Progress Notes (Signed)
HPI: [name] is a pleasant female who has a history of coronary artery status post coronary bypass and graft in 2004.  Her last Myoview was performed on June 28, 2007.  At that time, her ejection fraction was 66%.  The perfusion was normal.  Carotid Dopplers in April of 2011 showed 40-59% bilateral stenosis. Followup was recommended in one year. Patient also has renal insufficiency. I last saw her in April of 2011. Since then,   Current Outpatient Prescriptions  Medication Sig Dispense Refill  . aspirin 81 MG tablet Take 81 mg by mouth daily.        Marland Kitchen atorvastatin (LIPITOR) 80 MG tablet Take 80 mg by mouth daily.        . benzonatate (TESSALON) 100 MG capsule Take 100 mg by mouth 3 (three) times daily as needed.        Marland Kitchen dextromethorphan (DELSYM) 30 MG/5ML liquid Take 60 mg by mouth every 12 (twelve) hours as needed.        . fenofibrate micronized (LOFIBRA) 200 MG capsule Take 200 mg by mouth daily.        . metoprolol (LOPRESSOR) 50 MG tablet Take 50 mg by mouth 2 (two) times daily.        Marland Kitchen olmesartan (BENICAR) 40 MG tablet Take 40 mg by mouth daily.        Marland Kitchen omeprazole (PRILOSEC) 20 MG capsule Take 20 mg by mouth daily.           Past Medical History  Diagnosis Date  . Hypertension   . GERD (gastroesophageal reflux disease)   . Chronic renal insufficiency   . Diabetes mellitus     type II  . Diverticulosis of colon (without mention of hemorrhage)   . Personal history of colonic polyps   . Depression with anxiety   . PVD (peripheral vascular disease)   . Hyperlipidemia   . Coronary artery disease   . Carpal tunnel syndrome   . History of hysterectomy   . Polyp of colon     02/2005 hx poly results diverticulosis results: Adenomatous polyp    Past Surgical History  Procedure Date  . Cholecystectomy   . Coronary artery bypass graft     x4  . Appendectomy   . Knee surgery     History   Social History  . Marital Status: Widowed    Spouse Name: N/A    Number of  Children: N/A  . Years of Education: N/A   Occupational History  . Not on file.   Social History Main Topics  . Smoking status: Former Research scientist (life sciences)  . Smokeless tobacco: Not on file  . Alcohol Use: No  . Drug Use: No  . Sexually Active: Not on file   Other Topics Concern  . Not on file   Social History Narrative  . No narrative on file    ROS: no fevers or chills, productive cough, hemoptysis, dysphasia, odynophagia, melena, hematochezia, dysuria, hematuria, rash, seizure activity, orthopnea, PND, pedal edema, claudication. Remaining systems are negative.  Physical Exam: Well-developed well-nourished in no acute distress.  Skin is warm and dry.  HEENT is normal.  Neck is supple. No thyromegaly.  Chest is clear to auscultation with normal expansion.  Cardiovascular exam is regular rate and rhythm.  Abdominal exam nontender or distended. No masses palpated. Extremities show no edema. neuro grossly intact  ECG     This encounter was created in error - please disregard.

## 2010-11-14 ENCOUNTER — Other Ambulatory Visit: Payer: Self-pay | Admitting: Cardiology

## 2010-11-16 ENCOUNTER — Encounter: Payer: Self-pay | Admitting: Cardiology

## 2010-11-23 NOTE — Assessment & Plan Note (Signed)
[facility] OFFICE NOTE   NAME:[name], [name]                        MRN:          [id number]  DATE:06/26/2007                            DOB:          May 11, 1956    HISTORY:  [name] is a pleasant female who has a history of coronary  disease status post coronary artery bypass graft in February 2004.  Since I last saw her, she denies any dyspnea, chest pain, palpitations  or syncope.  She did lose her husband to colon cancer in July of this  year.  She is scheduled for eye surgery which will require general  anesthesia and we were asked to evaluate preoperatively.   MEDICATIONS:  1. Lipitor 80 mg p.o. daily.  2. Benicar 40 mg p.o. daily.  3. Lopressor 25 mg p.o. b.i.d.  4. Hydrochlorothiazide 12.5 mg p.o. daily.  5. Fenofibrate 200 mg p.o. daily.  6. Omeprazole 20 mg p.o. daily.  7. Insulin.  8. __________ .  9. Levothyroxine 88 mg p.o. daily.  10.Vitamin B-12.  11.Vitamin D.  12.Vitamin D-3.   PHYSICAL EXAMINATION:  VITAL SIGNS:  Blood pressure 156/71, pulse 79.  HEENT:  Normal.  NECK:  Supple with no bruits.  CHEST:  Clear.  CARDIOVASCULAR:  Regular rate.  ABDOMEN:  Shows no tenderness.  EXTREMITIES:  Show no edema.   DIAGNOSTICS:  Electrocardiogram shows a sinus rhythm at a rate of 69.  There are nonspecific ST changes.   DIAGNOSES:  1. Preoperative evaluation prior to eye surgery, the patient will      require general anesthesia.  It has now been almost 5 years since      her previous bypass surgery.  We will plan to brisk stratify with      adenosine Myoview.  If it shows no significant ischemia, then I      think she could proceed safely.  2. Coronary artery disease, status post coronary artery bypass graft.      She will continue on her statin, ARB, beta blocker.  She is off her      aspirin for her surgery, but will resume afterwards.  3. Hypertension, her blood pressure is elevated  today.  I have asked      her to increase her Lopressor to 50 mg p.o. b.i.d.  4. Hyperlipidemia.  She will continue on her statin and I will have      the most recent lipids and liver forwarded to [place] from Dr.      Vincente Poli office.  5. Diabetes mellitus, per her primary care physician.  6. Peripheral vascular disease, we will continue with medical therapy.   PLAN:  She will continue with her risk factor modification.  We  discussed the importance in diet and exercise.  She discontinued her  tobacco use 5 years ago.     Denice Bors Stanford Breed, MD, [facility]  Electronically Signed    BSC/MedQ  DD: 06/26/2007  DT: 06/26/2007  Job #: [id number]   cc:   Suszanne Conners, M.D.

## 2010-11-23 NOTE — Assessment & Plan Note (Signed)
[facility] [place] OFFICE NOTE   NAME:OWENSSereen, Schnebly                        MRN:          [id number]  DATE:10/03/2008                            DOB:          February 24, 1956    Ms. Maund is a pleasant female who has a history of coronary artery  status post coronary bypass and graft in 2004.  Her last Myoview was  performed on June 28, 2007.  At that time, her ejection fraction was  66%.  The perfusion was normal.  Since I last saw her, she is doing well  symptomatically.  She does have dyspnea on exertion, which she  attributes to her asthma and weight.  However, there is no orthopnea,  PND, pedal edema.  She has not had chest pain.  Note, she discontinued  her tobacco use 6 years ago.   MEDICATIONS:  1. Lipitor 80 mg p.o. daily.  2. Benicar 40 mg p.o. daily.  3. Metoprolol 50 mg p.o. b.i.d.  4. Fenofibrate 200 mg p.o. daily.  5. Omeprazole 20 mg p.o. daily.  6. Levothyroxine 88 mcg p.o. daily.  7. Aspirin 81 mg p.o. daily.   PHYSICAL EXAMINATION:  VITAL SIGNS:  Blood pressure of 100/50.  Her  pulse is 67.  HEENT:  Normal.  NECK:  Supple.  She has soft bilateral carotid bruits.  CHEST:  Clear.  CARDIOVASCULAR:  Regular rate and rhythm.  ABDOMEN:  No tenderness.  EXTREMITIES:  No edema.   Her electrocardiogram shows a sinus rhythm at a rate of 67.  The axis is  normal.  There is no RV conduction delay.  There are nonspecific ST  changes.  QT is mildly prolonged.   DIAGNOSES:  1. Coronary artery disease status post coronary bypass and graft - Ms.      Dalsing is doing well from symptomatic standpoint and her last      Myoview was low risk.  We will continue with medical therapy.  She      will continue on her aspirin, beta-blocker, ARB, and statin.  2. Hypertension - her blood pressure is controlled on her present      medications.  3. Hyperlipidemia - she will continue on her statin.  4. Diabetes mellitus -  management per primary care.  5. Peripheral vascular disease - she is having no symptoms at present.      She will continue on her aspirin and statin.  6. Carotid bruits - we will schedule carotid Dopplers.  7. History of cough with ACE inhibition.   I will have her most recent laboratories forwarded to me from Dr.  Allie Bossier office.  We will otherwise see her back in 1 year.  I have  stressed the importance of diet and exercise.     Denice Bors Stanford Breed, MD, [facility]  Electronically Signed    BSC/MedQ  DD: 10/03/2008  DT: 10/03/2008  Job #: [id number]   cc:   Esperanza Richters, MD

## 2010-11-26 NOTE — Op Note (Signed)
[facility]  Patient:    [name], [name] Visit Number: [id number] MRN: [id number]          Service Type: DSU Location: DAY Attending Physician:  Augustin Schooling. Dictated by:   Esmond Plants, M.D. Proc. Date: 01/15/02 Admit Date:  01/15/2002 Discharge Date: 01/15/2002                             Operative Report  PREOPERATIVE DIAGNOSIS:  Right knee medial meniscal tear.  POSTOPERATIVE DIAGNOSIS:  Right knee medial meniscal tear.  PROCEDURE:  Right knee arthroscopy with debridement of medial meniscal tear.  SURGEON:  Esmond Plants, M.D.  ASSISTANT:  None.  ANESTHESIA:  Local plus MAC.  ESTIMATED BLOOD LOSS:  Minimal.  TOURNIQUET TIME:  Zero.  FLUID REPLACEMENT:  800 cc crystalloid.  INSTRUMENT COUNTS:  Correct.  ANTIBIOTICS:  Preoperative antibiotics were given.  INDICATIONS FOR PROCEDURE:  The patient is a 55 year old female who presents with persistent right medial knee pain. The patient sustained an injury while on the job. After extensive physical therapy, activity modifications, rest, and anti-inflammatories, the patient continued to have medial knee pain consistent with a medial meniscal tear. The patient had provocative maneuvers positives for meniscal tear. MRI scan showed significant change in the meniscus suspicious for meniscus tear. After discussion with the patient her options for management to include continued conservative management versus surgical treatment with the arthroscope, the patient elected to proceed with surgery.  DESCRIPTION OF PROCEDURE:  After an adequate level of anesthesia was achieved, the patient was positioned supine on the operating table, a lateral post was utilized, a nonsterile tourniquet was placed on the right proximal thigh, the right leg was then prepped and draped in a standard sterile fashion. A standard medial and anterolateral portals as well as a superolateral outflow portal were created  in a similar fashion with infiltration of the skin with 0.5% Marcaine with epinephrine followed by incision with an 11 blade scalpel and introduction cannula in the joint using blunt obturators. Diagnostic arthroscopy revealed normal patellofemoral articular cartilage. There were no loose bodies noted in the knee. The medial and lateral gutters were free of loose bodies. Entering the medial compartment, there was noted to be a displaced bucket handle medial meniscal tear that had scarred in superiorly and laterally to the inferior patella retinaculum creating a band which was changing on the medial femoral condyle. There was some grade 2 chondromalacia noted on the medial femoral condyle. The remainder of the condyle and the remainder of the medial compartment were normal. The displaced bucket-handle was a wide tear and not repairable. This was debrided sharply using dividing instruments and a full radius resector. The ACL was noted to be intact, the lateral compartment was noted to be intact with no evidence of meniscal tear or lateral femoral condylar damage. The patellofemoral cartilage again was normal. Arthroscopy was concluded. The wounds were sutured using 4-0 Monocryl. Steri-Strips were applied followed by a sterile dressing. The patient tolerated the surgery well and was taken to PACU in stable condition. Dictated by:   Esmond Plants, M.D. Attending Physician:  Esmond Plants R. DD:  01/15/02 TD:  01/18/02 Job: [id number]

## 2010-11-26 NOTE — H&P (Signed)
[name], [name]                 ACCOUNT NO.:  [id number]   MEDICAL RECORD NO.:  [id number]          PATIENT TYPE:  OIB   LOCATION:  2899                         FACILITY:  [facility]   PHYSICIAN:  Ethelle Lyon, M.D. LHCDATE OF BIRTH:  [birth year]   DATE OF ADMISSION:  05/07/2004  DATE OF DISCHARGE:  05/07/2004                                HISTORY & PHYSICAL   REASON FOR ADMISSION:  Lower extremity angiography due to claudication.   HISTORY OF PRESENT ILLNESS:  [name] is a 55 year old lady with coronary  artery disease status post coronary artery bypass grafting in February 2004.  She describes a four to five year history of bilateral calf discomfort when  walking.  Discomfort is worse in her right calf than her left, but bothers  her on both sides.  She also has right greater than left buttock discomfort  when walking.  She currently walks on a treadmill at home approximately four  times per week.  She is able to walk at only 1.5 miles per hour with no  incline.  Any faster walking or incline she develops claudication.  When  walking on level ground she claudicates at well under 50 yards.  She has had  no rest pain or ulcerations.  Noninvasive evaluation demonstrated ABI on the  right of 0.5 and on the left of 0.87.  There was monophasic flow on the  right and femoral arteries suggestive of iliac disease.  There was also  severe mid SFA disease noted on the right.  She is admitted for diagnostic  angiography with consideration of revascularization.   PAST MEDICAL HISTORY:  1.  Coronary artery disease status post CABG 2004.  2.  Diabetes mellitus.  3.  Hypertension.  4.  Dyslipidemia.  5.  Carpal tunnel syndrome.  6.  Status post hysterectomy.  7.  Status post cholecystectomy.   ALLERGIES:  PLETAL has caused palpitations.  PLAVIX has caused indigestion.   CURRENT MEDICATIONS:  1.  Plavix 75 mg daily.  2.  Benicar 40 mg daily.  3.  Gemfibrozil 600 mg b.i.d.  4.   Enteric-coated aspirin 81 mg daily.  5.  Zoloft 50 mg q.h.s.  6.  Metoprolol 25 mg b.i.d.   SOCIAL HISTORY:  Patient is a school bus driver.  She is married with two  grown children.  She enjoys cooking, gardening, and sewing.  She exercises  as detailed in the HPI.  She quit smoking in February 2004.  Denies alcohol  or illicit drug use.   FAMILY HISTORY:  Father died of myocardial infarction at 4.  Mother is  alive and well at 39.  Three siblings are alive and well with ages ranging  from 82-51.   REVIEW OF SYSTEMS:  Occasional constipation, otherwise negative in detail  except as above.   PHYSICAL EXAMINATION:  GENERAL:  She is an obese woman in no distress.  VITAL SIGNS:  Heart rate 79, blood pressure 120/62, oxygen saturation 96% on  room air.  Weight is 200 pounds.  She is afebrile.  NECK:  She has no jugular  venous distention and no thyromegaly.  LUNGS:  Clear to auscultation.  CARDIAC:  She has a nondisplaced point of maximal cardiac impulse.  There is  a regular rate and rhythm without murmurs, rubs, or gallops.  ABDOMEN:  Soft, nondistended, nontender.  There is no hepatosplenomegaly.  Bowel sounds are normal.  EXTREMITIES:  Warm without clubbing, cyanosis, edema, or ulceration.  Carotid pulses are 2+ bilaterally without bruits.  Radial pulses are 2+  bilaterally.  Femoral pulses 1+ on the right and 2+ on the left without  bruit.  Popliteal pulses are absent bilaterally.  DP and PT pulses are both  trace bilaterally.   IMPRESSION/RECOMMENDATION:  A 55 year old lady with lifestyle limiting  claudication that has been refractory to conservative therapy.  She is  admitted for angiography with an eye to revascularization.      Will   WED/MEDQ  D:  06/10/2004  T:  06/10/2004  Job:  [id number]   cc:   Suszanne Conners, M.D.  [place]  [place] [address]  Fax: [phone]

## 2010-11-26 NOTE — Discharge Summary (Signed)
NAME:  [name], [name]                           ACCOUNT NO.:  [id number]   MEDICAL RECORD NO.:  [id number]                   PATIENT TYPE:  OIB   LOCATION:  6526                                 FACILITY:  [facility]   PHYSICIAN:  Quay Burow, M.D.                DATE OF BIRTH:  1956/02/22   DATE OF ADMISSION:  06/11/2002  DATE OF DISCHARGE:  06/12/2002                                 DISCHARGE SUMMARY   DISCHARGE DIAGNOSES:  1. Three-vessel coronary artery disease status post catheterization     06/11/2002 with percutaneous transluminal coronary angioplasty to the mid     circumflex.  2. Peripheral vascular disease with symptomatic claudication, survey results     pending.  3. Non-insulin-dependent diabetes mellitus.  4. Hyperlipidemia.  5. Obesity.  6. Family history of premature coronary artery disease.   HISTORY OF PRESENT ILLNESS:  The patient is a 55 year old Caucasian lady  with a prior history of diabetes mellitus, hyperlipidemia, and family  history of premature coronary artery disease. She was seen in the office on  06/05/2002 for evaluation of lower extremity claudication, and she was  referred to our office by Dr. Jaci Standard.   She also complained of exertional chest pain which sounded concerning, and  Dr. Gwenlyn Found was asked to evaluate the patient for elective cardiac  catheterization to assess the coronary artery circulation.   HOSPITAL COURSE:  The catheterization was performed on 06/11/2002 and showed  three-vessel coronary artery disease with 80% blockage of mid proximal LAD  with proximal circumflex 30 to 40% stenosis and high-grade stenosis of  circumflex around 90% and also disease of the RCA with 50% stenosis of the  proximal part, 70% mid portion of the RCA and mid to distal 50 to 60%and PDA  of 90% occlusion.   Dr. Gwenlyn Found performed angioplasty with stenting of the mid circumflex with a  reduction of stenotic lesion from 90% to 0%.  The patient tolerated the  procedure well.  She had a bolus of Angiomax and was transferred to the unit  in stable condition.   Hospital laboratories showed cardiac enzymes were negative.  BMP showed  potassium 3.9, sodium 136, chloride 104, CO2 25, BUN 13, creatinine 0.8,  glucose 180.  White blood cell count 7.8, hemoglobin 12.4, hematocrit 35.8,  and platelets 197.   The next morning post catheterization, she remained stable without  complaints of shortness of breath or chest pain.  Groin site was without  complications with no oozing or bleeding, no swelling, and the patient was  deemed stable for discharge home.   DISCHARGE MEDICATIONS:  1. Claritin 5 mg q.d.  2. Coated aspirin 81 mg q.d.  3. Glucovance 5/500 mg will be started on Friday, 06/14/2002.  4. Lipitor 40 mg q.d.  5. Levbid 600 mg q.d.  6. Lantus insulin 35 units q.h.s.   FOLLOW UP AND PLANS:  She will be  seen by Dr. Gwenlyn Found on 06/19/2002 at 12:15  in our office.  Probably during that time she will be scheduled for staged  LAD procedure.  Also, her lower extremity claudication requires further  workup which we will pursue after the first of the year .   ACTIVITY:  No driving, no heavy lifting greater than 5 pounds.  No strenuous  activity for three days.   DIET:  Low-fat, low-cholesterol diet.   SPECIAL INSTRUCTIONS:  She is allowed to shower and instructed to report any  signs of bleeding, oozing, swelling, or pain of the groin site to our  office.  The number was provided.     York Grice, P.A.                    Quay Burow, M.D.    MK/MEDQ  D:  06/12/2002  T:  06/12/2002  Job:  [id number]

## 2010-11-26 NOTE — Cardiovascular Report (Signed)
NAME:  [name], [name]                           ACCOUNT NO.:  [id number]   MEDICAL RECORD NO.:  [id number]                   PATIENT TYPE:  OIB   LOCATION:  2853                                 FACILITY:  [facility]   PHYSICIAN:  Quay Burow, M.D.                DATE OF BIRTH:  09-11-1955   DATE OF PROCEDURE:  06/11/2002  DATE OF DISCHARGE:                              CARDIAC CATHETERIZATION   PROCEDURE:  Cardiac catheterization/percutaneous coronary intervention.   INDICATION:  The patient is a 55 year old female with positive risk factors,  referred initially for evaluation of claudication.  On further questioning,  the patient did complain of exertional angina.  She presents now for  coronary arteriography with potential 2-D angiography if there are no  coronary interventions performed.   PROCEDURE DESCRIPTION:  The patient was brought to the second floor [facility] in a postabsorptive state.  She was premedicated with  p.o. Valium, IV Versed and Nubain.  The right groin was prepped and draped  in the usual sterile fashion.  Xylocaine 1% was used for local anesthesia.  A 6- upgraded to a 7-French sheath was inserted into the right femoral  artery using standard Seldinger technique.  The 6-French right and left  Judkins diagnostic catheters as well as a 6-French pigtail catheter were  used for selective coronary angiography, left ventriculography, subselective  left internal mammary artery angiography and distal abdominal aortography.  Omnipaque dye was used for the entirety of the case.  Retrograde aortic,  ventricular and pullback pressures were recorded.   HEMODYNAMIC DATA:  1. Aortic systolic pressure Q000111Q, diastolic pressure 66, left ventricular     systolic pressure 0000000 and end-diastolic pressure 17.   SELECTIVE CORONARY ANGIOGRAPHY:  1. Left main:  Normal.  2. LAD:  Fluoroscopically, the proximal third of the LAD was calcified.     There was an 80%  segmental lesion in the midportion.  3. Left circumflex:  This was a nondominant vessel with a large distal OM     branch.  There was 30-40% segmental proximal stenosis that appeared     calcified fluoroscopically.  There was a 90% segmental stenosis in the     distal circumflex.  4. Right coronary artery:  Dominant with 50% segmental proximal, 70% fairly     focal mid and 50-60% segmental distal stenosis.  5. Left internal mammary artery:  This was subselectively visualized and it     was widely patent.  It was suitable for use during coronary artery bypass     grafting.   LEFT VENTRICULOGRAPHY:  RAO left ventriculograms were performed using 20 cc  of Omnipaque dye at 10 cc/sec.  The overall LVEF was estimated at greater  than 60% without focal wall motion abnormalities.   DISTAL ABDOMINAL AORTOGRAPHY:  This was performed using 20 cc of Omnipaque  dye at 20 cc/sec.  The  renal arteries were widely patent.  The infrarenal  abdominal aorta and the iliac bifurcation appeared free of significant  atherosclerotic changes.   IMPRESSION:  The patient has three-vessel disease with preserved left  ventricular function.  Her lesions are fairly focal.  Her options are multi-  vessel percutaneous coronary intervention versus coronary artery bypass  grafting.  I believe she is a good percutaneous coronary intervention  candidate, given her age and the focality of her disease.  We will plan on  performing staged intervention with the circumflex today and the left  anterior descending at some point in the near future.  Her right will be  treated medically at this time.  We will address her peripheral vascular  occlusive disease after the first of the year.   PROCEDURE DESCRIPTION:  The existing 6-French sheath in the right femoral  artery was exchanged over a wire for a 7-French sheath.  The 6-French sheath  was then placed in the right femoral vein.  The patient received an Angiomax  bolus with an  ACT of greater than 300.  She was on aspirin and Plavix and  received an additional 150 mg of Plavix p.o.  The patient received IV Nubain  and Versed.   Using the 7-French JL-3.5 guide catheter along with an [id number] support  guidewire and a 2.5/15 Maverick, PCI was attempted, however, the Maverick  was unable to traverse the mid-circumflex.  Following this, a 2.0/15  CrossSail was used and this easily traversed the mid-circumflex and was used  to predilate the distal circumflex coronary artery.  There was an obvious  dissection and the patient did experience chest pain and ST segment  elevation with balloon inflation, which resolved promptly with balloon  deflation and administration of intracoronary nitroglycerin.  Following  this, a 2.5/20 CrossSail was used to perform prolonged low-pressure  inflation, resulting in a suboptimal angiographic result and obvious  dissection.  A 2.5/18 CYPHER stent was then used in an attempt to stent the  distal circumflex, however, this was unable to traverse the mid-circumflex  coronary artery.  The _____ [id number] support wire was then docked and exchanged  using a transit exchange catheter for a 300-mm long luge guidewire.  The  CYPHER stent was then attempted to be redeployed, however, it still got  caught up in the mid-circumflex, probably because of a calcific ridge.  Following this, a 2.5/15 S-650 stent successfully traversed the mid-  circumflex and was deployed in the distal circumflex at 12 atmospheres,  resulting in reduction of a 90% segmental distal circumflex stenosis to 0%  residual.  Patient tolerated the procedure well.  She received an additional  200 mcg of intracoronary nitroglycerin.  There was no dissection.  There was  TIMI-3 flow.   OVERALL IMPRESSION:  Successful distal circumflex percutaneous coronary  intervention and stenting with residual left anterior descending and right coronary artery disease.  The guidewire and catheters  were removed.  The  sheaths were subsequently sewn in place.  The patient left the laboratory in  stable condition.  The sheaths will be removed in two hours.  The patient  will remain in the recovery room for six hours and discharged in the  morning.  She will see me back in approximately one to two weeks and a  staged percutaneous coronary intervention of the left anterior descending  will be scheduled at that time.  The patient left the laboratory, pain-free,  in stable condition.  Dr. Jeanann Lewandowsky was notified  of these results.                                               Quay Burow, M.D.    Geralynn Rile  D:  06/11/2002  T:  06/11/2002  Job:  [id number]   cc:   Second Floor [facility]   [place] [place]   Jeanann Lewandowsky, M.D.  [address], [place] [address]  Fax: [phone]

## 2010-11-26 NOTE — H&P (Signed)
[name], [name]                             ACCOUNT NO.:  [id number]   MEDICAL RECORD NO.:  [id number]                   PATIENT TYPE:   LOCATION:                                       FACILITY:   PHYSICIAN:  Kirk Ruths, M.D. [facility]            DATE OF BIRTH:  1955-07-12   DATE OF ADMISSION:  08/12/2002  DATE OF DISCHARGE:                                HISTORY & PHYSICAL   HISTORY OF PRESENT ILLNESS:  [name] is a 55 year old female with past  medical history of coronary artery disease, diabetes mellitus,  hyperlipidemia, gastroesophageal reflux disease, who we are asked to  evaluate for chest pain. Of note, the patient has previously been cared for  by Dr. Gwenlyn Found but she would like a second opinion concerning her care. Her  cardiac history dates back to November of 2003. At that time, she developed  substernal chest pain that radiated to her back and down her upper  extremities. It was described as a burning sensation and there was  associated shortness of breath, nausea, and diaphoresis. The patient was  typically with exertion and relieved with rest. She also had claudication  symptoms. At that time, she had a cardiac catheterization on June 11, 2002 by Dr. Gwenlyn Found. She was found to have an 80% mid LAD, a 90% circumflex,  at 50% proximal followed by a 70% mid right coronary artery. She had PCI of  her circumflex at that time. Her symptoms improved for approximately one  week but then she was placed on Pletal for claudication. Of note, she did  have a peripheral arteriogram that showed a 50% left and right SFA. After  beginning the Pletal, she noticed increased heart rate as well as increased  reflux symptoms. She discontinued the medication but she had recurrent chest  pain and she states that this pain was similar to those prior to her PCI.  It was not like her previous gastroesophageal reflux disease pain. She did  have a nuclear study at [facility] that showed normal  perfusion. However,  her symptoms have persistent and she had 30-45 minutes of symptoms this  morning. She is presently pain free. We were asked to further evaluate her.   PAST MEDICAL HISTORY:  Significant for diabetes mellitus for approximately  12 years. She does have a history of hyperlipidemia. She denies any  hypertension. She has coronary artery disease as outlined above. She her  gastroesophageal reflux disease. She has had a prior cholecystectomy,  hysterectomy and appendectomy. She has had prior knee surgery.   SOCIAL HISTORY:  She does smoke but denies any alcohol use.   FAMILY HISTORY:  Positive for coronary artery disease in her father.   CURRENT MEDICATIONS:  Include insulin, Glucovance 5/500 mg twice a day,  Lopid 600 mg two at bedtime, Plavix 75 mg two each day, aspirin 81 mg two  each day, aspirin 81 mg  by mouth each day, Prevacid 30 mg two each day,  Lipitor 40 mg by mouth at bedtime.   ALLERGIES:  No known drug allergies, although she is intolerant to Pletal.   REVIEW OF SYSTEMS:  She denies any headaches, fever, or chills. No  productive cough or hemoptysis. There is no dysphagia, odynophagia, melena,  or hematochezia. There is no dysuria or hematuria. There is no seizure  activity. There is no orthopnea, PND, or pedal edema. She does have some  claudication symptoms. The remainder of her symptoms are negative.   PHYSICAL EXAMINATION:  VITAL SIGNS: Blood pressure 164/85 in the left arm  and 165/81 in the right arm. She has a pulse of 85. Respiratory rate 20.  GENERAL: She is well developed, well nourished  and somewhat obese. She is  in no acute distress.  SKIN: Warm and dry. No distal clubbing. She does not appear to be depressed.  HEENT: Unremarkable.  NECK: Supple. No bruits. No jugular venous distention or thyromegaly.  CHEST: Clear to auscultation and percussion.  CARDIAC: Regular rate and rhythm. Normal S1 and S2. No murmur, rub, or  gallop.  ABDOMEN:  Nontender. Positive bowel sounds. No hepatosplenomegaly or masses.  No abdominal bruit.  EXTREMITIES: She has 2+ femoral pulses bilaterally and no bruits. No edema  and I can palpate no cords. She has 2+ distal pulses bilaterally.  NEURO: Examination is grossly intact.   DIAGNOSTIC IMPRESSION:  EKG today shows normal sinus rhythm at a rate of 81.  The axis is normal. There are nonspecific ST changes. Chest x-ray showed no  acute disease.   LABORATORY DATA:  Hemoglobin and hematocrit 13.2 and 39.2. WBC count 8.5  with platelet count of 214,000. BUN and creatinine are 19 and 0.4. Potassium  3.9. Initial enzymes are negative.   DIAGNOSIS:  1. Chest pain.  2. History of coronary artery disease.  3. History of gastroesophageal reflux disease.  4. Moderate peripheral vascular disease.  5. Diabetes mellitus.  6. Hyperlipidemia.   PLAN:  [name] presents for evaluation of chest pain. Her symptoms are  somewhat difficult to distinguish between coronary artery disease and  reflux. However, she states that these symptoms are very similar to those  prior to her PCI. We have discussed the risks and benefits of cardiac  catheterization and plan to proceed tomorrow to re-define her anatomy and  also to exclude re-stenosis of her circumflex. I will make further  recommendations once we have that information. We will also cycle enzymes to  exclude infarct. We will continue with her aspirin. We will add Lopressor at  12.5 mg by mouth twice a day and increase as tolerated. We will also add  Altace 2.5 mg given her history of coronary artery disease and diabetes  mellitus. This will be increased also as tolerated by blood pressure. I have  discussed risk factor modification with her including discontinuing her  tobacco use. We also need to manage her lipids aggressively and she will  need tight control of her diabetes mellitus.  We will also continue with her Prevacid for her history of reflux.  Will make further recommendations once  we have her catheterization results available.                                                Kirk Ruths, M.D. [facility]  BC/MEDQ  D:  08/12/2002  T:  08/12/2002  Job:  [id number]

## 2010-11-26 NOTE — Cardiovascular Report (Signed)
NAME:  [name], [name]                           ACCOUNT NO.:  [id number]   MEDICAL RECORD NO.:  [id number]                   PATIENT TYPE:  OIB   LOCATION:  5733                                 FACILITY:  [facility]   PHYSICIAN:  Quay Burow, M.D.                DATE OF BIRTH:  [birth year]   DATE OF PROCEDURE:  DATE OF DISCHARGE:  07/05/2002                              CARDIAC CATHETERIZATION   INDICATIONS FOR PROCEDURE:  The patient is a 55 year old white female who  recently had cardiac catheterization and circumflex PCI and stenting for  angina.  Followup Cardiolite stress test was normal despite having an 80%  mid LAD.  Her other problems include noninsulin-requiring diabetes,  hyperlipidemia, and ongoing tobacco abuse.  She does have claudication.  She  presents now for angiography and potential intervention.   DESCRIPTION OF PROCEDURE:  The patient was brought to the sixth floor [facility] in the postabsorptive state.  She was premedicated with p.o. Valium.  Her right groin was prepped and  shaved in the usual sterile fashion.  Xylocaine, 1%, was used for local  anesthesia.  A #5 French sheath was inserted into the right femoral artery  using the standard Seldinger technique.  A #5 [place] tennis racquet catheter  was used for a midstream and distal abdominal aortography with bifemoral  runoff.  Omnipaque dye was used for the entirety of the case.  Retrograde  aortic pressures were monitored during the case.   ANGIOGRAPHIC RESULTS:  1. Abdominal aorta     A. Renal arteries:  Normal.     B. Infrarenal abdominal aorta:  Normal.  2. Left lower extremity:  A 50% segmental mid left SFA with two-vessel     runoff.  3. Right lower extremity     A. A 50% segmental mid right SFA stenosis with three-vessel runoff.   IMPRESSION:  The patient has mild diffuse atherosclerotic changes in her  infrainguinal vasculature, not amenable to  intervention.   PLAN:  Medical therapy including cardiac risk factor modification and  possibly Pletal.   The sheaths were removed and pressure was held on the groin to achieve  hemostasis.  The patient left the lab in stable condition.  She will be  discharged home later today as an outpatient.  We will see her back in three  weeks for followup.                                               Quay Burow, M.D.    JB/MEDQ  D:  07/05/2002  T:  07/05/2002  Job:  [id number]   cc:   Peripheral Vascular Angiographic [place], M.D.  [address],  [place] [address]  Fax: [phone]

## 2010-11-26 NOTE — Discharge Summary (Signed)
NAME:  [name], [name]                           ACCOUNT NO.:  [id number]   MEDICAL RECORD NO.:  [id number]                   PATIENT TYPE:  INP   LOCATION:  2014                                 FACILITY:  [facility]   PHYSICIAN:  Lilia Argue. Servando Snare, M.D.            DATE OF BIRTH:  1955/12/24   DATE OF ADMISSION:  08/12/2002  DATE OF DISCHARGE:  08/26/2002                                 DISCHARGE SUMMARY   ADMISSION DIAGNOSIS:  Prolonged atypical chest pain, coronary artery disease  versus reflux.   PAST MEDICAL HISTORY:  1. Coronary artery disease, status post cardiac catheterization and PCI     12/03 by Dr. Quay Burow.  2. Peripheral vascular disease, status post peripheral arteriogram revealed     50% left and right SFA.  3. Diabetes mellitus type 2 x12 years, followed by Dr. Jeanann Lewandowsky.     Admitting hemoglobin A1c 11.3.  4. Hyperlipidemia.  5. GERD.   PAST SURGICAL HISTORY:  1. Cholecystectomy.  2. Hysterectomy.  3. Appendectomy.  4. Knee surgery.   TOBACCO USE:  One half pack a day, last x33 years.   ALLERGIES:  No known drug allergies.  She is intolerant to PLETAL, causes  elevated heart rate and increased reflux symptoms.   BRIEF HISTORY:  The patient presented to the [facility] Cardiology  office 08/12/02, for a second opinion regarding her coronary artery disease.  At that time she describes several weeks of anterior chest burning that  radiated to her back and axilla, and both sides of her neck.  This also  occurs 2-3 times a day, lasting less than one hour in duration; the morning  of 08/12/02, she had a 30-45 minute episode of these symptoms.  [place] office referred her to [facility] Emergency Department for  further evaluation.   HOSPITAL COURSE:  The patient was admitted to [facility] on 08/12/02,  under the care of [facility] Cardiology [facility].  After examination  of the patient and review of any available  records, it was recommended to  her to proceed to do a cardiac catheterization, she agreed with this plan.   On 08/13/02, cardiac catheterization revealed:  1. 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  2. Progression of disease in LAD.  3. She also was noted to have a TSH at 0.008, questionable hyperthyroid.  4. Lesion not amenable to further PCI cardiac surgery consult was requested.   The patient was evaluated later in the day by Dr. Lanelle Bal.  After  examination of this patient, review of all available records, including a  catheterization film, Dr. Servando Snare agreed that coronary artery bypass  grafting would benefit the patient.  However, since she has been on Plavix,  Angiomax and Integrelin, he preferred to wait before proceeding with  coronary bypass for seven days after discontinuing the antiplatelet  medications.  In the meantime he recommended diabetes control and work up of  her low TSH.   Smoking cessation consult was requested and obtained on 08/13/02.  The patient  is noted to be very motivated to stop smoking.  She will be followed in the  outpatient setting __________.   On 08/13/02, arterial Doppler evaluation was performed.  No significant  carotid artery disease was noted, her Allen's test was within normal limits  on both hands, her AVI's were noted to be greater than 1.0 bilaterally.   On 08/13/02, Dr. Jeanann Lewandowsky was consulted regarding management of her  endocrine issues.  Thyroid function tests were repeated and repeat TSH on  08/13/02, was 1.865, T4 1.03, free T3 2.6.  Dr. Ainsley Spinner impression was that  she had no symptoms of hyperthyroidism and clinically is euthyroid.  No  treatment necessary.  He assisted with management of her diabetes throughout  her hospital stay adjusting her Lantus and NovoLog dosaging.   Over the next several days, the patient remained stable in the hospital.  She remained smoke-free as an inpatient and began participate in  cardiac  rehab phase I.   On 08/20/02, the patient underwent the following surgical procedure with Dr.  Percell Miller B. Gerhardt.  1. Coronary artery bypass grafting x4.  Grafts placed at time of procedure:     Left internal mammary arteriograph generalized anterior descending     artery, saphenous veins grafted to the sequential fashion to the first     obtuse marginal and distal circumflex arteries, saphenous veins grafted     to the distal right coronary artery.  Vein was harvested from the right     eye and lower leg via the endo vein harvesting technique.  2. Exploration of left renal artery.  This artery was not suitable for     bypass, it was very small.  The patient's tolerated this procedure well     and was transferred in stable condition to the SICU.  She remained     hemodynamically stable in the immediate postoperative period and was     extubated several hours later.  Her postoperative course has been     uneventful and she is making very good progress in her recovery.  3. On the morning of 08/25/02, the patient reports feeling very well, her     vital signs are stable with a blood pressure of 100/60, she is afebrile,     her oxygen saturation is 92% on room air.  Her _________ is 72,     yesterday's range was 71 to 130.  Her heart has remained normal sinus     rhythm, her lungs are clear.  Her bowel or bladder functions are within     normal limits for her.  She is eating small portions of each meal.  Her     incisions are healing very well, she has no lower extremity edema, she is     ambulating independently and her pain control is adequate.  If the     patient continues to progress in this manner, it is anticipated that she     will be ready for discharged to home tomorrow 08/26/02.   RECENT LABORATORY STUDIES ON 08/24/02:  CBC was WBC 11.2, hemoglobin 8.8, hematocrit 25.6, platelets 93.  Chemistries included sodium 141, potassium  5.3, BUN 30, creatinine 1.2, glucose 122.    DISCHARGE CONDITION:  Improved.   DISCHARGE MEDICATIONS:  1. Altace 2.5 mg p.o. daily.  2. Lopressor  25 mg p.o. [id number].  3. Folic acid 1 mg p.o. daily.  4. Ferrous sulfate 325 mg p.o. b.i.d. with food.  5. Colace 200 mg p.o. daily.   HOME MEDICATIONS:  She is instructed to resume her home medications:  1. Lantus insulin 70 units daily with dinner.  2. Novolog sliding scale CBG less than 200 0 units, 201-250 3 units, 251-300     7 units, 301-350 9 units, 351-450 15 units, greater than 450 19 units and     she is instructed to call Dr. Carlis Abbott.  3. Enteric-coated aspirin 325 mg p.o. daily, which is a new dose for her.  4. Prevacid 30 mg p.o. daily.  5. Gemfibrozil 600 mg p.o. b.i.d.  6. Lipitor 40 mg p.o. q. evening.  7. For pain management Ultram 50 mg 1-2 p.o. q.4-6h. p.r.n. pain.   DISCHARGE ACTIVITIES:  She has been asked to refrain from any driving or  heavy lifting, pushing or pulling.  She has also been instructed to continue  her breathing exercises and daily walking.  Diet should be a diabetic diet.   WOUND CARE:  She will shower with mild soap and water.  If wounds are red,  not, swollen, draining or temperature greater than 101 degrees Fahrenheit  she should call Dr. Everrett Coombe office.   FOLLOWUP:  1. She will be seen at the [facility] Cardiology office in     approximately two weeks.  She will have a chest x-ray taken that day.  An     appointment will be made prior to her discharge.  2. Dr. Servando Snare would like to see her in the [place] office in approximately     three weeks, the office will contact her at home with a date and time.  3. She should follow up with Dr. Carlis Abbott regarding her diabetes in     approximately 2-3 weeks.     Mardene Celeste, R.N.                  Lilia Argue. Servando Snare, M.D.    CTK/MEDQ  D:  08/25/2002  T:  08/25/2002  Job:  [id number]   cc:   Jeanann Lewandowsky, M.D.  [address], [place]  [place] [address]  Fax: [phone]    Loretha Brasil. Lia Foyer, M.D. [facility]

## 2010-11-26 NOTE — Op Note (Signed)
NAME:  [name], [name]                           ACCOUNT NO.:  [id number]   MEDICAL RECORD NO.:  [id number]                   PATIENT TYPE:  INP   LOCATION:  2014                                 FACILITY:  [facility]   PHYSICIAN:  Lilia Argue. Servando Snare, M.D.            DATE OF BIRTH:  [birth year]   DATE OF PROCEDURE:  08/20/2002  DATE OF DISCHARGE:                                 OPERATIVE REPORT   PREOPERATIVE DIAGNOSIS:  Coronary occlusive disease with unstable angina.   POSTOPERATIVE DIAGNOSIS:  Coronary occlusive disease with unstable angina.   PROCEDURE:  Coronary artery bypass grafting x4 with left internal mammary  artery to the left anterior descending coronary artery, sequential reversed  saphenous vein graft to the obtuse marginal and distal circumflex, reversed  saphenous vein graft to the right coronary artery, using right endovein  harvesting, and attempt at left radial artery harvesting.   SURGEON:  Lilia Argue. Servando Snare, M.D.   ASSISTANTS:  Revonda Standard. Roxan Hockey, M.D., and Mardene Celeste, R.N.   BRIEF HISTORY:  The patient is a 55 year old female with previous smoking  history and diabetes, who presents with unstable anginal symptoms.  She had  previously undergone angioplasty of the circumflex coronary artery and  presented now with progressive stenosis in the midportion of the LAD and  proximal LAD, 60 and 70% stenoses in the right coronary artery, a large  dominant circumflex with a mid-stenosis of a previously-placed stent, and a  smaller first obtuse marginal with 80% proximal stenosis.  Because of the  patient's symptoms and three-vessel disease, coronary artery bypass grafting  was recommended.   DESCRIPTION OF PROCEDURE:  With Swan-Ganz and arterial line monitors in  place, the patient underwent general endotracheal anesthesia without  incident.  The skin of the chest and legs was prepped with Betadine and  draped in the usual sterile manner.  Vein was harvested  endoscopically from  the right thigh.  In addition, the left arm had been prepped and we  initially started dissecting out the left radial artery.  After a short  segment was exposed, the radial artery was very small and was not felt to be  of satisfactory size to use.  The incision was closed.  Median sternotomy  was performed, and the left internal mammary artery was dissected down as a  pedicle graft.  The distal artery was divided and had good, free flow.  The  pericardium was opened and overall ventricular function appeared preserved.  The patient was systemically heparinized, the ascending aorta and the right  atrium were cannulated, and the aortic root vent cardioplegia needle was  introduced into the ascending aorta.  The patient was placed on  cardiopulmonary bypass 2.5 L/min. per sq. m, sites of anastomosis were  dissected out of the epicardium and the patient's body temperature cooled to  30 degrees, aortic crossclamp was applied, 500 mL of cold blood potassium  cardioplegia was administered with rapid diastolic arrest of the heart.  Myocardial septal temperature was monitored throughout the crossclamp  period.  Attention was turned first to the OM-1 vessel, which was small but  admitted a 1 mm probe.  Using a diamond-type side-to-side anastomosis was  carried out with a running 7-0 Prolene.  The distal extent of the same vein  was then carried a short distance to the much larger and distal portion of  the circumflex system.  This is a vessel that had an easily-identified  stent.  The artery was opened distal to the stent and a distal anastomosis  was performed.  Attention was then turned to a small distal right coronary  artery, which was opened and admitted a 1.5 mm probe.  Using a running 7-0  Prolene, distal anastomosis was performed.  Attention was turned to the left  anterior descending coronary artery.  This vessel was small and diffusely  diseased between the mid- and  distal third of the vessel.  The artery was  opened.  Using a running 8-0 Prolene, the left internal mammary artery was  anastomosed to the left anterior descending coronary artery.  With release  of the Edwards bulldog on the mammary artery, there was appropriate rise in  myocardial septal temperature and the aortic crossclamp was removed, total  crossclamp time of 53 minutes.  The patient required electrical  defibrillation and returned to a sinus rhythm.  A partial occlusion clamp  was placed on the ascending aorta.  Two punch aortotomies were performed.  Each of the two vein grafts were anastomosed to the ascending aorta.  Air  was evacuated from the grafts and the partial occlusion clamp was removed.  Sites of anastomosis were inspected and were free of bleeding.  The patient  was then ventilated and weaned from cardiopulmonary bypass without  difficulty and remained hemodynamically stable, was decannulated in the  usual fashion, protamine sulfate was administered.  With the operative field  hemostatic, two atrial and two ventricular pacing wires were applied, graft  markers applied.  A left pleural tube and two mediastinal tubes were left in  place.  Sternum was closed with #6 stainless steel wire.  The fascia closed  with interrupted 0 Vicryl, running 3-0 Vicryl in the subcutaneous tissue, 4-  0 subcuticular stitch in the skin edges.  Dry dressings were applied.  Sponge and needle count was reported as correct at the completion of the  procedure.  The patient tolerated the procedure without obvious complication  and was transferred to the surgical intensive care unit for further  postoperative care.                                               Lilia Argue Servando Snare, M.D.    Mcneil Sober  D:  08/26/2002  T:  08/26/2002  Job:  [id number]   cc:   Loretha Brasil. Lia Foyer, M.D. [facility]

## 2010-11-26 NOTE — Op Note (Signed)
[name], [name] NO.:  [id number]   MEDICAL RECORD NO.:  [id number]          PATIENT TYPE:  OIB   LOCATION:  2899                         FACILITY:  [facility]   PHYSICIAN:  Ethelle Lyon, M.D. LHCDATE OF BIRTH:  February 11, 1956   DATE OF PROCEDURE:  05/07/2004  DATE OF DISCHARGE:                                 OPERATIVE REPORT   PROCEDURE:  Abdominal aortography with bilateral lower extremity run off.   INDICATIONS FOR PROCEDURE:  [name] is a 55 year old lady with lifestyle  limiting claudication in a setting of known peripheral vascular disease.  She has left greater than right calf claudication which occurs at a fairly  variable work load.  When walking on level ground, she generally claudicates  at less than 50 yards.  She feels that this has limited her ability to  exercise in an effort to lose weight.  Noninvasive evaluation included  Duplex ultrasonography of the lower extremities performed January 26, 2004,  demonstrating ABI on the right 0.5, on the left 0.87.  There was monophasic  flow in the right common femoral suggestive of iliac disease.  There was  severe mid SFA stenosis noted on the right.  Based on these symptoms and  findings, she was referred for diagnostic angiography with consideration to  revascularization.   PROCEDURE TECHNIQUE:  Informed consent was obtained.  Under 1% lidocaine  local anesthesia, a 5 French sheath was placed in the right femoral artery  using the modified Seldinger technique.  A pigtail catheter was advanced to  the suprarenal abdominal aorta and abdominal aortography performed by power  injection.  The catheter was then pulled back to the infrarenal abdominal  aorta.  Abdominal aortography with bilateral lower extremity run off was  performed by power injection using digital subtraction and step table  technique.  Further images of the pelvis were then obtained in oblique  views.  Finally, images of the right leg  vasculature were obtained via  sheath injection.  The patient tolerated the procedure well and was  transferred to the holding room in stable condition.  The sheaths will be  removed there.   COMPLICATIONS:  None.   FINDINGS:  1.  Abdominal aorta:  Normal vessel with no evidence of atherosclerotic      plaque.  2.  Renal arteries:  Single vessels bilaterally, both are normal.  3.  Right leg:  Normal common, internal, and external iliac arteries.  The      common femoral is normal.  The profunda has an 80% stenosis in a large      medial branch.  The SFA has a fairly focal 60% stenosis in its mid      section.  There is another 40% stenosis in the proximal popliteal.      There is three vessel run off to the foot without disease in the run off      vessels.  4.  Left leg:  Normal common, internal, and external iliac arteries.  The      common femoral is normal.  The profunda has a  70% proximal stenosis.      The SFA is diffusely but mildly diseased proximally (approximately 30%      stenosis).  There is a 30% stenosis at the mid SFA.  The anterior tibial      has a 70% stenosis proximally.  There is three vessel run off to the      foot.   IMPRESSION/RECOMMENDATIONS:  [name] has moderate SFA disease bilaterally.  Recommend conservative therapy.  Will emphasize exercise.       WED/MEDQ  D:  05/07/2004  T:  05/07/2004  Job:  [id number]   cc:   Kirk Ruths, M.D. [facility]   Suszanne Conners, M.D.  [place]  [place] [address]  Fax: [phone]

## 2010-11-26 NOTE — Cardiovascular Report (Signed)
NAME:  [name], [name]                           ACCOUNT NO.:  [id number]   MEDICAL RECORD NO.:  [id number]                   PATIENT TYPE:  INP   LOCATION:  6599                                 FACILITY:  [facility]   PHYSICIAN:  Loretha Brasil. Lia Foyer, M.D. [facility]         DATE OF BIRTH:  1956/06/23   DATE OF PROCEDURE:  08/13/2002  DATE OF DISCHARGE:                              CARDIAC CATHETERIZATION   INDICATIONS:  The patient is a pleasant 55 year old female who has had prior  stenting of the circumflex coronary artery.  At that time they were unable  to deploy a Cypher stent because of the calcified anatomy.  As a result, Dr.  Gwenlyn Found placed a standard stent in the circumflex.  She had developed  recurrent unstable angina and is brought back to the catheterization lab.  In the interim she is also noted to have a TSH of 0.008, and therefore  contrast was held.  We elected to limit the contrast load.  I also called  for an endocrinology consult with Dr. Carlis Abbott.   PROCEDURE:  Selective coronary arteriography.   DESCRIPTION OF PROCEDURE:  The procedure was performed from the right  femoral artery using 6 French catheters.  She tolerated the procedure  without complication.  She was taken to the holding area in satisfactory  clinical condition.   ANGIOGRAPHIC DATA:  On plain fluoroscopy there was significant calcification  of all three coronary arteries.  1. The left main coronary artery was free of significant disease.  2. The left anterior descending artery coursed to the apex.  In the mid     vessel, there is a 95% stenosis.  The distal vessel was quite small     suggesting diffuse distal disease. This LAD stenosis has progressed from     the previous study.  3. The circumflex has about a 30-40% area of proximal narrowing followed by     heavy calcification in the bend.  Following this, there is about 40%     narrowing after the marginal.  The marginal itself has about a 50%     narrowing.   The AV circumflex leads into a large second marginal that is     stented.  The stent has diffuse in-stent re-stenosis of about 90%.  4. The right coronary artery is a dominant vessel.  There is about 70% mid     narrowing and a 50% stenosis before the crux.  There is about a 70-80%     diffuse narrowing in the second posterolateral branch and this is a     diffusely diseased vessel.   CONCLUSIONS:  1. High-grade re-stenosis of the stent placed in the circumflex artery.  2. Progressive disease in the left anterior descending artery.  3. Possible hypothyroidism.   PLAN:  1. Endocrinology consult.  2. Probable CVTS consult.  A Cypher stent cannot be placed distally, and     there  is diffuse in-stent re-stenosis beyond the stented edges in the     large circumflex vessel. In addition, this would be difficult for     brachytherapy.     The LAD itself is not suitable for use of the drug-eluting stent at the     present time given its small size.  Given the patient's diabetes and     heavily calcified coronaries, she will likely be best served by an     internal mammary placed to the LAD. The internal mammary is widely patent     as noted on the previous study by Dr. Gwenlyn Found.                                                 Loretha Brasil. Lia Foyer, M.D. [facility]    TDS/MEDQ  D:  08/13/2002  T:  08/13/2002  Job:  [id number]   cc:   Sharene Butters, M.D.  [address]  [place]  [place] [address]  Fax: [phone]   Jeanann Lewandowsky, M.D.  [address], [place] [address]  Fax: [phone]   [facility]

## 2010-12-20 ENCOUNTER — Other Ambulatory Visit: Payer: Self-pay | Admitting: Cardiology

## 2011-01-03 ENCOUNTER — Ambulatory Visit (INDEPENDENT_AMBULATORY_CARE_PROVIDER_SITE_OTHER): Payer: BC Managed Care – PPO | Admitting: Cardiology

## 2011-01-03 ENCOUNTER — Encounter: Payer: Self-pay | Admitting: Cardiology

## 2011-01-03 DIAGNOSIS — I6529 Occlusion and stenosis of unspecified carotid artery: Secondary | ICD-10-CM

## 2011-01-03 DIAGNOSIS — I739 Peripheral vascular disease, unspecified: Secondary | ICD-10-CM

## 2011-01-03 DIAGNOSIS — I10 Essential (primary) hypertension: Secondary | ICD-10-CM

## 2011-01-03 DIAGNOSIS — N259 Disorder resulting from impaired renal tubular function, unspecified: Secondary | ICD-10-CM

## 2011-01-03 DIAGNOSIS — E785 Hyperlipidemia, unspecified: Secondary | ICD-10-CM

## 2011-01-03 DIAGNOSIS — I2510 Atherosclerotic heart disease of native coronary artery without angina pectoris: Secondary | ICD-10-CM

## 2011-01-03 NOTE — Assessment & Plan Note (Signed)
Blood pressure mildly elevated but she states typically 120-130/70. Continue present medications.

## 2011-01-03 NOTE — Assessment & Plan Note (Signed)
Continue statin. Lipids and liver monitor for primary care.

## 2011-01-03 NOTE — Assessment & Plan Note (Signed)
Continued aspirin and statin. Schedule Myoview for risk stratification.

## 2011-01-03 NOTE — Assessment & Plan Note (Signed)
Continue aspirin and statin. Schedule followup carotid Dopplers. 

## 2011-01-03 NOTE — Assessment & Plan Note (Signed)
Monitored by nephrology.

## 2011-01-03 NOTE — Patient Instructions (Signed)
Your physician wants you to follow-up in: [place] will receive a reminder letter in the mail two months in advance. If you don't receive a letter, please call our office to schedule the follow-up appointment.   Your physician has requested that you have a lexiscan myoview. For further information please visit [email or web address]. Please follow instruction sheet, as given.   Your physician has requested that you have a carotid duplex. This test is an ultrasound of the carotid arteries in your neck. It looks at blood flow through these arteries that supply the brain with blood. Allow one hour for this exam. There are no restrictions or special instructions.   Your physician has requested that you have a lower extremity arterial duplex. During this test, exercise and ultrasound are used to evaluate arterial blood flow in the legs. Allow one hour for this exam. There are no restrictions or special instructions.

## 2011-01-03 NOTE — Assessment & Plan Note (Signed)
Significant claudication. Schedule ABI with dopplers.

## 2011-01-03 NOTE — Progress Notes (Signed)
HPI: [name] is a pleasant female who has a history of coronary artery status post coronary bypass and graft in 2004.  Her last Myoview was performed on June 28, 2007.  At that time, her ejection fraction was 66%.  The perfusion was normal.  Carotid Dopplers in April of 2011 showed 40-59% bilateral stenosis. Followup was recommended in one year. Patient also has renal insufficiency. Since I last saw her there is no chest pain. There is dyspnea with more extreme activities but not with routine activities. There is no orthopnea, PND or pedal edema. She does have pain in her lower extremities bilaterally left greater than right with ambulation. This occurs with walking up hills or a quicker pace.  Current Outpatient Prescriptions  Medication Sig Dispense Refill  . aspirin 81 MG tablet Take 81 mg by mouth daily.        Marland Kitchen atorvastatin (LIPITOR) 80 MG tablet Take 80 mg by mouth daily.        . fenofibrate micronized (LOFIBRA) 200 MG capsule TAKE ONE CAPSULE BY MOUTH EVERY DAY  30 capsule  0  . metoprolol (LOPRESSOR) 50 MG tablet Take 50 mg by mouth 2 (two) times daily.        Marland Kitchen olmesartan (BENICAR) 40 MG tablet Take 40 mg by mouth daily.        Marland Kitchen omeprazole (PRILOSEC) 20 MG capsule Take 20 mg by mouth daily.        Marland Kitchen DISCONTD: benzonatate (TESSALON) 100 MG capsule Take 100 mg by mouth 3 (three) times daily as needed.        Marland Kitchen DISCONTD: dextromethorphan (DELSYM) 30 MG/5ML liquid Take 60 mg by mouth every 12 (twelve) hours as needed.           Past Medical History  Diagnosis Date  . Hypertension   . GERD (gastroesophageal reflux disease)   . Chronic renal insufficiency   . Diabetes mellitus     type II  . Diverticulosis of colon (without mention of hemorrhage)   . Depression with anxiety   . PVD (peripheral vascular disease)   . Hyperlipidemia   . Coronary artery disease   . Carpal tunnel syndrome   . Polyp of colon     02/2005 hx poly results diverticulosis results: Adenomatous polyp     Past Surgical History  Procedure Date  . Cholecystectomy   . Coronary artery bypass graft     x4  . Appendectomy   . Knee surgery   . Abdominal hysterectomy     History   Social History  . Marital Status: Widowed    Spouse Name: N/A    Number of Children: N/A  . Years of Education: N/A   Occupational History  . Not on file.   Social History Main Topics  . Smoking status: Former Research scientist (life sciences)  . Smokeless tobacco: Not on file  . Alcohol Use: No  . Drug Use: No  . Sexually Active: Not on file   Other Topics Concern  . Not on file   Social History Narrative  . No narrative on file    ROS: no fevers or chills, productive cough, hemoptysis, dysphasia, odynophagia, melena, hematochezia, dysuria, hematuria, rash, seizure activity, orthopnea, PND, pedal edema, claudication. Remaining systems are negative.  Physical Exam: Well-developed obese in no acute distress.  Skin is warm and dry.  HEENT is normal. Bilateral bruits Neck is supple. No thyromegaly.  Chest is clear to auscultation with normal expansion.  Cardiovascular exam is regular rate and  rhythm.  Abdominal exam nontender or distended. No masses palpated. Extremities show no edema. Distal pulses diminished. neuro grossly intact  ECG Normal sinus rhythm at a rate of 68. Axis normal. RV conduction delay. Anterolateral T wave changes. Prolonged QT.

## 2011-01-10 ENCOUNTER — Encounter: Payer: Self-pay | Admitting: *Deleted

## 2011-01-25 ENCOUNTER — Encounter: Payer: BC Managed Care – PPO | Admitting: *Deleted

## 2011-01-25 ENCOUNTER — Encounter (INDEPENDENT_AMBULATORY_CARE_PROVIDER_SITE_OTHER): Payer: BC Managed Care – PPO | Admitting: *Deleted

## 2011-01-25 ENCOUNTER — Ambulatory Visit (HOSPITAL_COMMUNITY): Payer: BC Managed Care – PPO | Attending: Cardiology | Admitting: Radiology

## 2011-01-25 VITALS — Ht 64.0 in | Wt 231.0 lb

## 2011-01-25 DIAGNOSIS — I25810 Atherosclerosis of coronary artery bypass graft(s) without angina pectoris: Secondary | ICD-10-CM

## 2011-01-25 DIAGNOSIS — I2510 Atherosclerotic heart disease of native coronary artery without angina pectoris: Secondary | ICD-10-CM | POA: Insufficient documentation

## 2011-01-25 DIAGNOSIS — R0609 Other forms of dyspnea: Secondary | ICD-10-CM

## 2011-01-25 DIAGNOSIS — I739 Peripheral vascular disease, unspecified: Secondary | ICD-10-CM

## 2011-01-25 DIAGNOSIS — E119 Type 2 diabetes mellitus without complications: Secondary | ICD-10-CM

## 2011-01-25 DIAGNOSIS — I6529 Occlusion and stenosis of unspecified carotid artery: Secondary | ICD-10-CM

## 2011-01-25 MED ORDER — TECHNETIUM TC 99M TETROFOSMIN IV KIT
11.0000 | PACK | Freq: Once | INTRAVENOUS | Status: AC | PRN
  Administered 2011-01-25: 11 via INTRAVENOUS

## 2011-01-25 MED ORDER — TECHNETIUM TC 99M TETROFOSMIN IV KIT
33.0000 | PACK | Freq: Once | INTRAVENOUS | Status: AC | PRN
  Administered 2011-01-25: 33 via INTRAVENOUS

## 2011-01-25 MED ORDER — REGADENOSON 0.4 MG/5ML IV SOLN
0.4000 mg | Freq: Once | INTRAVENOUS | Status: AC
  Administered 2011-01-25: 0.4 mg via INTRAVENOUS

## 2011-01-25 NOTE — Progress Notes (Signed)
[place] [address] [phone]  Cardiology Nuclear Med Study  [name] is a 55 y.o. female [id number] Nov 01, 1955   Nuclear Med Background Indication for Stress Test:  Evaluation for Ischemia, Graft/Stent Patency History:  '03 Stent-CFX; '04 CABG; '08 MPS:No ischemia, EF=66% Cardiac Risk Factors: Carotid Disease, Claudication, Family History - CAD, History of Smoking, Hypertension, IDDM Type 2, Lipids, Obesity and PVD  Symptoms:  DOE and Fatigue   Nuclear Pre-Procedure Caffeine/Decaff Intake:  None NPO After: 9:00pm   Lungs:  Clear.  O2 Sat 98% on RA. IV 0.9% NS with Angio Cath:  22g  IV Site: R Forearm  IV Started by:  Irven Baltimore, RN  Chest Size (in):  46 Cup Size: B  Height: 5\' 4"  (1.626 m)  Weight:  231 lb (104.781 kg)  BMI:  Body mass index is 39.65 kg/(m^2). Tech Comments:  Lopressor held this am, per patient.     Nuclear Med Study 1 or 2 day study: 1 day  Stress Test Type:  Lexiscan  Reading MD: Darlin Coco, MD  Order Authorizing Provider:  Kirk Ruths, MD  Resting Radionuclide: Technetium 2m Tetrofosmin  Resting Radionuclide Dose: 11 mCi   Stress Radionuclide:  Technetium 15m Tetrofosmin  Stress Radionuclide Dose: 33 mCi           Stress Protocol Rest HR: 70 Stress HR: 89  Rest BP: 153/63 Stress BP: 172/54  Exercise Time (min): n/a METS: n/a   Predicted Max HR: 164 bpm % Max HR: 54.27 bpm Rate Pressure Product: 15308   Dose of Adenosine (mg):  n/a Dose of Lexiscan: n/a mg  Dose of Atropine (mg): n/a Dose of Dobutamine: n/a mcg/kg/min (at max HR)  Stress Test Technologist: Letta Moynahan, CMA-N  Nuclear Technologist:  Charlton Amor, CNMT     Rest Procedure:  Myocardial perfusion imaging was performed at rest 45 minutes following the intravenous administration of Technetium 36m Tetrofosmin.  Rest ECG: Nonspecific ST-T wave changes.  Stress Procedure:  The patient received  IV Lexiscan 0.4 mg over 15-seconds.  Technetium 42m Tetrofosmin injected at 30-seconds.  There were no significant changes with Lexiscan.  Quantitative spect images were obtained after a 45 minute delay.  Stress ECG: No significant change from baseline ECG  QPS Raw Data Images:  Normal; no motion artifact; normal heart/lung ratio. Stress Images:  Normal homogeneous uptake in all areas of the myocardium. Rest Images:  Normal homogeneous uptake in all areas of the myocardium. Subtraction (SDS):  No evidence of ischemia. Transient Ischemic Dilatation (Normal <1.22):  .94 Lung/Heart Ratio (Normal <0.45):  .39  Quantitative Gated Spect Images QGS EDV:  69 ml QGS ESV:  17 ml QGS cine images:  NL LV Function; NL Wall Motion QGS EF: 75%  Impression Exercise Capacity:  Lexiscan with no exercise. BP Response:  Normal blood pressure response. Clinical Symptoms:  No chest pain. ECG Impression:  No significant ST segment change suggestive of ischemia. Comparison with Prior Nuclear Study: No significant change from previous study of 06/28/07  Overall Impression:  Normal stress nuclear study.     Darlin Coco

## 2011-01-26 NOTE — Progress Notes (Signed)
nuc med report routed to [facility] 01/25/11 [name]

## 2011-01-27 ENCOUNTER — Encounter: Payer: Self-pay | Admitting: Cardiology

## 2011-01-27 NOTE — Progress Notes (Signed)
pt aware of results [name]  

## 2011-01-28 ENCOUNTER — Other Ambulatory Visit: Payer: Self-pay | Admitting: Cardiology

## 2011-03-08 ENCOUNTER — Other Ambulatory Visit: Payer: Self-pay | Admitting: *Deleted

## 2011-03-08 MED ORDER — FENOFIBRATE MICRONIZED 200 MG PO CAPS: 200.0000 mg | ORAL_CAPSULE | Freq: Every day | ORAL | Status: DC

## 2011-03-16 ENCOUNTER — Encounter: Payer: Self-pay | Admitting: Internal Medicine

## 2011-04-06 ENCOUNTER — Other Ambulatory Visit: Payer: Self-pay | Admitting: Cardiology

## 2011-04-11 LAB — GLUCOSE, CAPILLARY: Glucose-Capillary: 193 — ABNORMAL HIGH

## 2011-04-25 ENCOUNTER — Encounter: Payer: Self-pay | Admitting: Internal Medicine

## 2011-04-25 ENCOUNTER — Other Ambulatory Visit: Payer: Self-pay | Admitting: Family Medicine

## 2011-04-25 DIAGNOSIS — Z1231 Encounter for screening mammogram for malignant neoplasm of breast: Secondary | ICD-10-CM

## 2011-05-08 ENCOUNTER — Other Ambulatory Visit: Payer: Self-pay | Admitting: Cardiology

## 2011-05-11 ENCOUNTER — Ambulatory Visit
Admission: RE | Admit: 2011-05-11 | Discharge: 2011-05-11 | Disposition: A | Discharge status: Home / Self Care | Payer: BC Managed Care – PPO | Source: Ambulatory Visit | Attending: Family Medicine | Admitting: Family Medicine

## 2011-05-11 DIAGNOSIS — Z1231 Encounter for screening mammogram for malignant neoplasm of breast: Secondary | ICD-10-CM

## 2011-05-13 ENCOUNTER — Encounter: Payer: Self-pay | Admitting: Internal Medicine

## 2011-05-13 ENCOUNTER — Ambulatory Visit (AMBULATORY_SURGERY_CENTER): Payer: BC Managed Care – PPO | Admitting: *Deleted

## 2011-05-13 VITALS — Ht 64.0 in | Wt 235.0 lb

## 2011-05-13 DIAGNOSIS — Z1211 Encounter for screening for malignant neoplasm of colon: Secondary | ICD-10-CM

## 2011-05-13 MED ORDER — PEG-KCL-NACL-NASULF-NA ASC-C 100 G PO SOLR: ORAL | Status: DC

## 2011-05-27 ENCOUNTER — Encounter: Payer: Self-pay | Admitting: Internal Medicine

## 2011-05-27 ENCOUNTER — Ambulatory Visit (AMBULATORY_SURGERY_CENTER): Payer: BC Managed Care – PPO | Admitting: Internal Medicine

## 2011-05-27 ENCOUNTER — Other Ambulatory Visit: Payer: Self-pay | Admitting: Internal Medicine

## 2011-05-27 VITALS — BP 158/59 | HR 77 | Temp 98.0°F | Resp 14 | Ht 64.0 in | Wt 235.0 lb

## 2011-05-27 DIAGNOSIS — Z1211 Encounter for screening for malignant neoplasm of colon: Secondary | ICD-10-CM

## 2011-05-27 DIAGNOSIS — Z860100 Personal history of colon polyps, unspecified: Secondary | ICD-10-CM

## 2011-05-27 DIAGNOSIS — D126 Benign neoplasm of colon, unspecified: Secondary | ICD-10-CM

## 2011-05-27 MED ORDER — SODIUM CHLORIDE 0.9 % IV SOLN: 500.0000 mL | INTRAVENOUS | Status: DC

## 2011-05-27 NOTE — Patient Instructions (Signed)
Please refer to the blue and neon green sheets for instructions regarding diet and activity for the rest of today.  Handout on polyps given. Resume previous medications.

## 2011-05-30 ENCOUNTER — Telehealth: Payer: Self-pay

## 2011-05-30 NOTE — Telephone Encounter (Signed)
Left message on answering machine. 

## 2011-06-13 ENCOUNTER — Other Ambulatory Visit: Payer: Self-pay | Admitting: Cardiology

## 2011-07-12 HISTORY — PX: KNEE SURGERY: SHX244

## 2011-07-14 ENCOUNTER — Other Ambulatory Visit: Payer: Self-pay | Admitting: Cardiology

## 2011-08-02 ENCOUNTER — Other Ambulatory Visit: Payer: Self-pay | Admitting: Cardiology

## 2011-08-02 MED ORDER — METOPROLOL TARTRATE 50 MG PO TABS: 50.0000 mg | ORAL_TABLET | Freq: Two times a day (BID) | ORAL | Status: DC

## 2011-08-18 ENCOUNTER — Other Ambulatory Visit: Payer: Self-pay | Admitting: *Deleted

## 2011-08-18 MED ORDER — FENOFIBRATE MICRONIZED 200 MG PO CAPS: 200.0000 mg | ORAL_CAPSULE | Freq: Every day | ORAL | Status: DC

## 2011-10-14 ENCOUNTER — Encounter: Payer: Self-pay | Admitting: Internal Medicine

## 2011-10-19 ENCOUNTER — Other Ambulatory Visit: Payer: Self-pay | Admitting: Family Medicine

## 2011-10-19 ENCOUNTER — Telehealth: Payer: Self-pay | Admitting: Hematology & Oncology

## 2011-10-19 NOTE — Telephone Encounter (Signed)
Left pt messages to call for appointment

## 2011-11-10 ENCOUNTER — Ambulatory Visit (HOSPITAL_BASED_OUTPATIENT_CLINIC_OR_DEPARTMENT_OTHER): Payer: BC Managed Care – PPO | Admitting: Hematology & Oncology

## 2011-11-10 ENCOUNTER — Other Ambulatory Visit (HOSPITAL_BASED_OUTPATIENT_CLINIC_OR_DEPARTMENT_OTHER): Payer: BC Managed Care – PPO | Admitting: Lab

## 2011-11-10 ENCOUNTER — Ambulatory Visit (HOSPITAL_BASED_OUTPATIENT_CLINIC_OR_DEPARTMENT_OTHER): Payer: BC Managed Care – PPO

## 2011-11-10 ENCOUNTER — Other Ambulatory Visit: Payer: BC Managed Care – PPO

## 2011-11-10 VITALS — BP 130/56 | HR 63 | Temp 97.2°F | Ht 63.0 in | Wt 228.0 lb

## 2011-11-10 DIAGNOSIS — D631 Anemia in chronic kidney disease: Secondary | ICD-10-CM

## 2011-11-10 DIAGNOSIS — D509 Iron deficiency anemia, unspecified: Secondary | ICD-10-CM

## 2011-11-10 DIAGNOSIS — D649 Anemia, unspecified: Secondary | ICD-10-CM

## 2011-11-10 LAB — CBC WITH DIFFERENTIAL (CANCER CENTER ONLY)
BASO%: 0.4 % (ref 0.0–2.0)
EOS%: 2.1 % (ref 0.0–7.0)
HCT: 37.1 % (ref 34.8–46.6)
LYMPH%: 17.5 % (ref 14.0–48.0)
MCHC: 32.1 g/dL (ref 32.0–36.0)
MCV: 79 fL — ABNORMAL LOW (ref 81–101)
MONO%: 7.3 % (ref 0.0–13.0)
NEUT#: 5.3 10*3/uL (ref 1.5–6.5)
Platelets: 202 10*3/uL (ref 145–400)

## 2011-11-10 LAB — CHCC SATELLITE - SMEAR

## 2011-11-10 NOTE — Progress Notes (Signed)
This office note has been dictated.

## 2011-11-11 ENCOUNTER — Other Ambulatory Visit: Payer: Self-pay

## 2011-11-11 NOTE — Progress Notes (Signed)
CC:   [name], M.D. Docia Chuck. Henrene Pastor, MD  DIAGNOSES: 1. Microcytic anemia, iron deficiency. 2. Likely anemia secondary to renal insufficiency. 3. Diabetes mellitus.  HISTORY OF PRESENT ILLNESS:  [name] is a very nice 56 year old white female who is known to me.  I took care of her husband, [name].  He passed away with colon cancer a few years ago.  She is being taken care of by [name].  She has been noted to have anemia issues.  Her blood count has gone down over the past few months.  With the records that were sent over by Dr. Florina Ou, we find that she has microcytic anemia.  Back on April 4th, CBC was done which showed a white cell count 8.8, hemoglobin 8.7, hematocrit 30.1, and platelet count was 239.  Her ferritin was 10.  Iron saturation was not calculated because it was so low.  Her MCV at that time was 73.  She had normal folate and B12.  Her BUN and creatinine were up a little bit at 36 and 1.9.  She did send off a erythropoietin level, which is pending.  She also sent off a TSH which was 5.8.  She was started on iron pills.  She is having no problems taking the iron pills.  She takes these 3 times a day.  Dr. Modena Dorsey kindly referred [name] to the [facility] for an evaluation of anemia to make sure that she was not overlooking anything.  She did have some heme-positive stools last year.  She sees [name]. She underwent a colonoscopy which showed a polyp.  She apparently is going to see [name] again for an upper endoscopy and a possible capsule endoscopy.  She does chew a lot of ice.  Since starting the oral iron, this has improved.  She has not had any obvious bleeding that she has seen.  There is no hemoptysis.  She does smoke.  She is not sure when she had her last chest x-ray.  There has been no hematuria.  She does not have any bleeding from the "female area."  There has been no melena or bright red blood per rectum.   She has had no hemoptysis.  There has been no weight loss or weight gain.  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  PAST MEDICAL HISTORY:  Remarkable for: 1. Insulin-dependent diabetes. 2. Hypertension. 3. Renal insufficiency. 4. Hypothyroidism. 5. Asthma. 6. Hyperlipidemia. 7. Chronic low back pain/degenerative disk disease. 8. GERD. 9. Depression.  ALLERGIES:  Percocet.  MEDICATIONS:  Aspirin 81 mg p.o. daily, fenofibrate 200 mg p.o. daily, iron sulfate 325 mg t.i.d., Lasix 40 mg p.o. b.i.d., Lantus insulin 55 units subcu b.i.d., Synthroid 0.088 mg p.o. daily, Lipitor 80 mg p.o. daily, metoprolol 50 mg p.o. b.i.d., Prilosec 40 mg p.o. b.i.d., Ventolin inhaler 2 puffs q.4 hours p.r.n., Vicodin (10/325) one p.o. q.i.d. p.r.n., Xanax 1 mg p.o. t.i.d., Zoloft 400 mg p.o. b.i.d., vitamin D 50,000 units weekly.  SOCIAL HISTORY:  Remarkable for tobacco use.  She smokes 1 pack per day. She probably has been smoking for 25 years.  There is no history of alcohol use.  There are no occupational exposures.  FAMILY HISTORY:  Remarkable for, I think breast cancer, diabetes, hypertension, and coronary artery disease.  REVIEW OF SYSTEMS:  As stated in the history of present illness.  No additional findings are noted on a 12-system  review.  PHYSICAL EXAMINATION:  General:  This is a well-developed, well- nourished white female in no obvious distress.  Vital Signs:  Show a temperature of 97.6, pulse 63, respiratory rate 22, blood pressure 130/56, weight is 228.  Head and Neck Exam:  Shows a normocephalic, atraumatic skull.  There are no ocular or oral lesions.  There are no palpable cervical or supraclavicular lymph nodes.  Lungs:  Clear bilaterally.  Cardiac Exam:  Regular rate and rhythm with a normal S1 and S2.  There are no murmurs, rubs, or bruits.  Abdominal Exam:  Soft with good bowel sounds.  There is no palpable abdominal mass.  There is no  fluid wave.  There is no palpable hepatosplenomegaly.  Back Exam:  No tenderness over the spine, ribs, or hips.  Extremities:  Show no clubbing, cyanosis, or edema.  She has good range motion of her joints. Skin Exam:  No rashes, ecchymosis, or petechia.  Neurological Exam: Shows no focal neurological deficits.  LABORATORY STUDIES:  White cell count 7.3, hemoglobin 11.9, hematocrit 37.1, platelet count 202.  MCV is 79.  Peripheral smear shows mild anisocytosis and poikilocytosis.  There are no nucleated red blood cells.  There are no teardrop cells.  There are some hypochromic and microcytic red cells.  There are a couple of mirror cells.  White cells appear normal in morphology and maturation.  There are no hypersegmented polys.  There are no immature myeloid cells. There are no atypical lymphocytes.  Platelets are adequate in number and size.  IMPRESSION:  [name] is a very nice 56 year old white female with microcytic anemia.  This clearly is iron deficient.  She is on oral iron.  That is all that really needs to be done.  Her anemia has corrected itself really within a month.  I think the real issue is where she might be losing blood from.  I still believe that the GI tract is the source.  I totally agree with [name] doing an upper endoscopy and possible capsule endoscopy.  She had a colonoscopy last year with removal of a polyp.  I do not see that scans need to be done.  She does not need to have a bone marrow test done.  Her blood smear definitely was consistent with iron deficiency.  I do not see any evidence of myelodysplasia.  At this point in time, I do not think we need to get her back to see [place]. It was really nice seeing her again.  She is doing well.  We will see what her iron studies are.  Again, she is on oral iron.  I told her that she could probably go down to 1 pill a day of the iron.  I spent a good hour or so with [name] today.  I reviewed her lab  work with her.  She also feels that she does not need to come back since she is doing well on the oral iron.    ______________________________ Volanda Napoleon, M.D. PRE/MEDQ  D:  11/10/2011  T:  11/11/2011  Job:  [id number]

## 2011-11-14 LAB — FERRITIN: Ferritin: 32 ng/mL (ref 10–291)

## 2011-11-14 LAB — HEMOGLOBINOPATHY EVALUATION
Hemoglobin Other: 0 %
Hgb A2 Quant: 2.3 % (ref 2.2–3.2)
Hgb F Quant: 0 % (ref 0.0–2.0)
Hgb S Quant: 0 %

## 2011-11-14 LAB — ERYTHROPOIETIN: Erythropoietin: 5.9 m[IU]/mL (ref 2.6–34.0)

## 2011-11-15 ENCOUNTER — Ambulatory Visit (INDEPENDENT_AMBULATORY_CARE_PROVIDER_SITE_OTHER): Payer: BC Managed Care – PPO | Admitting: Internal Medicine

## 2011-11-15 ENCOUNTER — Encounter: Payer: Self-pay | Admitting: Internal Medicine

## 2011-11-15 ENCOUNTER — Ambulatory Visit
Admission: RE | Admit: 2011-11-15 | Discharge: 2011-11-15 | Disposition: A | Discharge status: Home / Self Care | Payer: BC Managed Care – PPO | Source: Ambulatory Visit | Attending: Family Medicine | Admitting: Family Medicine

## 2011-11-15 VITALS — BP 124/60 | HR 60 | Ht 62.0 in | Wt 225.0 lb

## 2011-11-15 DIAGNOSIS — R195 Other fecal abnormalities: Secondary | ICD-10-CM

## 2011-11-15 DIAGNOSIS — Z860100 Personal history of colon polyps, unspecified: Secondary | ICD-10-CM

## 2011-11-15 DIAGNOSIS — R110 Nausea: Secondary | ICD-10-CM

## 2011-11-15 DIAGNOSIS — D509 Iron deficiency anemia, unspecified: Secondary | ICD-10-CM

## 2011-11-15 DIAGNOSIS — E119 Type 2 diabetes mellitus without complications: Secondary | ICD-10-CM

## 2011-11-15 NOTE — Progress Notes (Signed)
HISTORY OF PRESENT ILLNESS:  [name] is a 56 y.o. female with multiple medical problems including morbid obesity, hypertension, insulin requiring diabetes mellitus, hyperlipidemia, coronary artery disease status post coronary artery bypass grafting, gastroesophageal reflux disease, anxiety/depression, and adenomatous colon polyps. She is also status post cholecystectomy, appendectomy, and hysterectomy. The patient was last seen in 05/27/2011 which he underwent surveillance colonoscopy (prior exams 2006 and 2009). She was found to have a diminutive colon polyp, but otherwise normal colonoscopy. She is sent today regarding new-onset iron deficiency anemia. The patient had been experiencing progressive fatigue as well as craving ice. On 10/13/2010 CBC revealed a hemoglobin of 8.7 with MCV 73.4. White blood cell count and platelets were normal. B12 and folate were normal. Iron and ferratin were low. She was placed on iron 3 times daily. Hematology and GI evaluations arranged. Repeat hemoglobin 11/10/2011 was 11.9. MCV 79. She was told to decrease iron to once daily. No further hematology workup planned, but GI evaluation recommended. The patient did have Hemoccult-positive stool on physical exam back in April. She denies hematochezia or melena. She has chronic problems with nausea, though this has worsened significantly since starting iron. No vomiting. She states she has lost 10 pounds over the past week because of decreased appetite. She takes PPI for her GERD. This works well. Prior upper endoscopy in 2006 revealed a hiatal hernia. She is accompanied by her daughter.  REVIEW OF SYSTEMS:  All non-GI ROS negative except for sinus an allergy trouble, anxiety, back pain, cough, fatigue, night sweats, shortness of breath, urinary leakage  Past Medical History  Diagnosis Date  . Hypertension   . GERD (gastroesophageal reflux disease)   . Chronic renal insufficiency   . Diabetes mellitus     type II  .  Diverticulosis of colon (without mention of hemorrhage)   . Depression with anxiety   . PVD (peripheral vascular disease)   . Hyperlipidemia   . Coronary artery disease   . Carpal tunnel syndrome   . Asthma   . Thyroid disease     hypo  . Adenomatous polyp of colon   . Depression   . Anemia   . Obesity     Past Surgical History  Procedure Date  . Cholecystectomy   . Coronary artery bypass graft     x4  . Appendectomy   . Knee surgery   . Abdominal hysterectomy     Social History [name]  reports that she has quit smoking. She has never used smokeless tobacco. She reports that she does not drink alcohol or use illicit drugs.  family history includes Colon polyps in her daughter; Diabetes in her mother; and Heart disease in her father.  There is no history of Colon cancer, and Esophageal cancer, and Stomach cancer, .  Allergies  Allergen Reactions  . Percocet (Oxycodone-Acetaminophen) Shortness Of Breath       PHYSICAL EXAMINATION:  Vital signs: BP 124/60  Pulse 60  Ht 5\' 2"  (1.575 m)  Wt 225 lb (102.059 kg)  BMI 41.15 kg/m2 General:Obese. Well-developed, well-nourished, no acute distress. Unhealthy appearing HEENT: Sclerae are anicteric, conjunctiva pink. Oral mucosa intact Lungs: Clear Heart: Regular Abdomen: soft,obese, nontender, nondistended, no obvious ascites, no peritoneal signs, normal bowel sounds. No organomegaly. Extremities: No edema Psychiatric: alert and oriented x3. Cooperative    ASSESSMENT:  #1. Iron deficiency anemia. Rule out occult GI mucosal lesion. Improved after iron supplementation #2. Hemoccult-positive stool #3. History of adenomatous colon polyps. Last colonoscopy November  2012 with diminutive hyperplastic polyp only #4. GERD. Asymptomatic on PPI #5. Chronic nausea. Worse recently, likely due to iron. May have an element of diabetic gastroparesis #6. Multiple significant medical problems  PLAN:  #1. Continue iron once  daily. This may need to be changed if this is felt to be the cause of nausea #2. Diagnostic upper endoscopy to evaluate iron deficiency anemia, Hemoccult-positive stool, and nausea. The patient is high-risk given her comorbidities.The nature of the procedure, as well as the risks, benefits, and alternatives were carefully and thoroughly reviewed with the patient. Ample time for discussion and questions allowed. The patient understood, was satisfied, and agreed to proceed. CRNA supervised propofol administration for sedation. Adjust diabetic medications preprocedure as instructed #3. Continue PPI #4. Consider capsule endoscopy if EGD negative #5. Antiemetics requested. They prescribed post procedure if symptoms persist after iron withdrawal #6. Ongoing followup for general medical care with Dr. Modena Morrow

## 2011-11-15 NOTE — Patient Instructions (Signed)
You have been scheduled for an endoscopy with propofol. Please follow written instructions given to you at your visit today.  

## 2011-11-16 ENCOUNTER — Encounter: Payer: Self-pay | Admitting: Internal Medicine

## 2011-11-16 ENCOUNTER — Ambulatory Visit (AMBULATORY_SURGERY_CENTER): Payer: BC Managed Care – PPO | Admitting: Internal Medicine

## 2011-11-16 VITALS — BP 145/66 | HR 71 | Temp 96.6°F | Resp 16 | Ht 62.0 in | Wt 225.0 lb

## 2011-11-16 DIAGNOSIS — D509 Iron deficiency anemia, unspecified: Secondary | ICD-10-CM

## 2011-11-16 DIAGNOSIS — D1330 Benign neoplasm of unspecified part of small intestine: Secondary | ICD-10-CM

## 2011-11-16 DIAGNOSIS — R110 Nausea: Secondary | ICD-10-CM

## 2011-11-16 DIAGNOSIS — K219 Gastro-esophageal reflux disease without esophagitis: Secondary | ICD-10-CM

## 2011-11-16 DIAGNOSIS — R195 Other fecal abnormalities: Secondary | ICD-10-CM

## 2011-11-16 DIAGNOSIS — Z86010 Personal history of colonic polyps: Secondary | ICD-10-CM

## 2011-11-16 LAB — GLUCOSE, CAPILLARY: Glucose-Capillary: 267 mg/dL — ABNORMAL HIGH (ref 70–99)

## 2011-11-16 MED ORDER — SODIUM CHLORIDE 0.9 % IV SOLN: 500.0000 mL | INTRAVENOUS | Status: DC

## 2011-11-16 MED ORDER — ONDANSETRON HCL 4 MG PO TABS: 4.0000 mg | ORAL_TABLET | Freq: Four times a day (QID) | ORAL | Status: DC | PRN

## 2011-11-16 NOTE — Patient Instructions (Signed)
HOLD IRON FOR ONE WEEK TO SEE IF NAUSEA IMPROVES  YOU HAD AN ENDOSCOPIC PROCEDURE TODAY AT [facility]: Refer to the procedure report that was given to you for any specific questions about what was found during the examination.  If the procedure report does not answer your questions, please call your gastroenterologist to clarify.  If you requested that your care partner not be given the details of your procedure findings, then the procedure report has been included in a sealed envelope for you to review at your convenience later.  YOU SHOULD EXPECT: Some feelings of bloating in the abdomen. Passage of more gas than usual.  Walking can help get rid of the air that was put into your GI tract during the procedure and reduce the bloating. If you had a lower endoscopy (such as a colonoscopy or flexible sigmoidoscopy) you may notice spotting of blood in your stool or on the toilet paper. If you underwent a bowel prep for your procedure, then you may not have a normal bowel movement for a few days.  DIET: Your first meal following the procedure should be a light meal and then it is ok to progress to your normal diet.  A half-sandwich or bowl of soup is an example of a good first meal.  Heavy or fried foods are harder to digest and may make you feel nauseous or bloated.  Likewise meals heavy in dairy and vegetables can cause extra gas to form and this can also increase the bloating.  Drink plenty of fluids but you should avoid alcoholic beverages for 24 hours.  ACTIVITY: Your care partner should take you home directly after the procedure.  You should plan to take it easy, moving slowly for the rest of the day.  You can resume normal activity the day after the procedure however you should NOT DRIVE or use heavy machinery for 24 hours (because of the sedation medicines used during the test).    SYMPTOMS TO REPORT IMMEDIATELY: A gastroenterologist can be reached at any hour.  During normal business  hours, 8:30 AM to 5:00 PM Monday through Friday, call [phone].  After hours and on weekends, please call the GI answering service at [phone] who will take a message and have the physician on call contact you.   Following upper endoscopy (EGD)  Vomiting of blood or coffee ground material  New chest pain or pain under the shoulder blades  Painful or persistently difficult swallowing  New shortness of breath  Fever of 100F or higher  Black, tarry-looking stools  FOLLOW UP: If any biopsies were taken you will be contacted by phone or by letter within the next 1-3 weeks.  Call your gastroenterologist if you have not heard about the biopsies in 3 weeks.  Our staff will call the home number listed on your records the next business day following your procedure to check on you and address any questions or concerns that you may have at that time regarding the information given to you following your procedure. This is a courtesy call and so if there is no answer at the home number and we have not heard from you through the emergency physician on call, we will assume that you have returned to your regular daily activities without incident.  SIGNATURES/CONFIDENTIALITY: You and/or your care partner have signed paperwork which will be entered into your electronic medical record.  These signatures attest to the fact that that the information above on your After  Visit Summary has been reviewed and is understood.  Full responsibility of the confidentiality of this discharge information lies with you and/or your care-partner.

## 2011-11-16 NOTE — Progress Notes (Signed)
Patient did not experience any of the following events: a burn prior to discharge; a fall within the facility; wrong site/side/patient/procedure/implant event; or a hospital transfer or hospital admission upon discharge from the facility. (G8907) Patient did not have preoperative order for IV antibiotic SSI prophylaxis. (G8918)  

## 2011-11-16 NOTE — Op Note (Signed)
[facility] [address] [place], [place]  [address]  ENDOSCOPY PROCEDURE REPORT  PATIENT:  [name], [name]  MR#:  [id number] BIRTHDATE:  [birth year], 40 yrs. old  GENDER:  female  ENDOSCOPIST:  Docia Chuck. Geri Seminole, MD Referred by:  Office  PROCEDURE DATE:  11/16/2011 PROCEDURE:  EGD with biopsy, [id number] ASA CLASS:  Class III INDICATIONS:  iron deficiency anemia, nausea, hemoccult positive stool  MEDICATIONS:   MAC sedation, administered by CRNA, propofol (Diprivan) 250 mg IV TOPICAL ANESTHETIC:  none  DESCRIPTION OF PROCEDURE:   After the risks benefits and alternatives of the procedure were thoroughly explained, informed consent was obtained.  The LB [id number] endoscope was introduced through the mouth and advanced to the third portion of the duodenum, without limitations.  The instrument was slowly withdrawn as the mucosa was fully examined. <<PROCEDUREIMAGES>>  The upper, middle, and distal third of the esophagus were carefully inspected and no abnormalities were noted. The z-line was well seen at the GEJ. The endoscope was pushed into the fundus which was normal including a retroflexed view. The antrum,gastric body, first and second/third part of the duodenum were unremarkable. Duodenal bx taken.   Retroflexed views revealed a small hiatal hernia.    The scope was then withdrawn from the patient and the procedure completed.  COMPLICATIONS:  None  ENDOSCOPIC IMPRESSION: 1) Normal EGD 2) GERD 3) Nausea  RECOMMENDATIONS: 1) Capsule endoscopy to be arranged by my office 2) Follow up biopsies 3) Hold iron for one week to see if nausea improves 4) Prescribe zofran 4mg  ; 30; one po q 4-6 hours prn nausea; 3 refills  ______________________________ Docia Chuck. Geri Seminole, MD  CC:  Florina Ou, MD;  The Patient  n. eSIGNED:   Docia Chuck. Geri Seminole at 11/16/2011 08:25 AM  Gregery Na, [id number]

## 2011-11-17 ENCOUNTER — Telehealth: Payer: Self-pay | Admitting: *Deleted

## 2011-11-17 ENCOUNTER — Telehealth: Payer: Self-pay

## 2011-11-17 NOTE — Telephone Encounter (Signed)
Pt scheduled for capsule teaching 11/22/11@2pm  and capsule endo 11/24/11@8am . Pt aware of appt dates and times.

## 2011-11-21 ENCOUNTER — Encounter: Payer: Self-pay | Admitting: Internal Medicine

## 2011-11-24 ENCOUNTER — Ambulatory Visit (INDEPENDENT_AMBULATORY_CARE_PROVIDER_SITE_OTHER): Payer: BC Managed Care – PPO | Admitting: Internal Medicine

## 2011-11-24 DIAGNOSIS — D509 Iron deficiency anemia, unspecified: Secondary | ICD-10-CM

## 2011-11-24 NOTE — Progress Notes (Signed)
Capsule endoscopy performed without difficulty. Pt reports she passed the capsule already.   Lot [id number]       25

## 2011-12-01 ENCOUNTER — Encounter: Payer: Self-pay | Admitting: Internal Medicine

## 2011-12-28 ENCOUNTER — Telehealth: Payer: Self-pay | Admitting: Internal Medicine

## 2011-12-28 DIAGNOSIS — D500 Iron deficiency anemia secondary to blood loss (chronic): Secondary | ICD-10-CM

## 2011-12-28 NOTE — Telephone Encounter (Signed)
Patient advised of the results and Dr. Blanch Media recommendations of capsule endo.  She is advised to come for labs mid July.

## 2012-01-11 ENCOUNTER — Telehealth: Payer: Self-pay

## 2012-01-11 MED ORDER — ONDANSETRON HCL 4 MG PO TABS: 4.0000 mg | ORAL_TABLET | Freq: Four times a day (QID) | ORAL | Status: DC | PRN

## 2012-01-11 NOTE — Telephone Encounter (Signed)
Per [facility], pt only allowed 12 tablets until 01/25/12

## 2012-01-13 ENCOUNTER — Other Ambulatory Visit (INDEPENDENT_AMBULATORY_CARE_PROVIDER_SITE_OTHER): Payer: BC Managed Care – PPO

## 2012-01-13 ENCOUNTER — Other Ambulatory Visit: Payer: Self-pay | Admitting: *Deleted

## 2012-01-13 DIAGNOSIS — D649 Anemia, unspecified: Secondary | ICD-10-CM

## 2012-01-13 DIAGNOSIS — D500 Iron deficiency anemia secondary to blood loss (chronic): Secondary | ICD-10-CM

## 2012-01-13 LAB — CBC WITH DIFFERENTIAL/PLATELET
Basophils Relative: 0.3 % (ref 0.0–3.0)
Eosinophils Relative: 1.9 % (ref 0.0–5.0)
HCT: 40.8 % (ref 36.0–46.0)
MCV: 85.6 fl (ref 78.0–100.0)
Monocytes Absolute: 0.6 10*3/uL (ref 0.1–1.0)
Monocytes Relative: 7.3 % (ref 3.0–12.0)
Neutrophils Relative %: 73.3 % (ref 43.0–77.0)
RBC: 4.76 Mil/uL (ref 3.87–5.11)
WBC: 8.1 10*3/uL (ref 4.5–10.5)

## 2012-02-29 ENCOUNTER — Other Ambulatory Visit: Payer: Self-pay | Admitting: *Deleted

## 2012-02-29 DIAGNOSIS — I6529 Occlusion and stenosis of unspecified carotid artery: Secondary | ICD-10-CM

## 2012-03-01 ENCOUNTER — Other Ambulatory Visit: Payer: Self-pay | Admitting: Cardiology

## 2012-04-11 ENCOUNTER — Other Ambulatory Visit: Payer: Self-pay | Admitting: Cardiology

## 2012-04-11 DIAGNOSIS — I70219 Atherosclerosis of native arteries of extremities with intermittent claudication, unspecified extremity: Secondary | ICD-10-CM

## 2012-04-13 ENCOUNTER — Encounter (INDEPENDENT_AMBULATORY_CARE_PROVIDER_SITE_OTHER): Payer: BC Managed Care – PPO

## 2012-04-13 DIAGNOSIS — I739 Peripheral vascular disease, unspecified: Secondary | ICD-10-CM

## 2012-04-13 DIAGNOSIS — I70219 Atherosclerosis of native arteries of extremities with intermittent claudication, unspecified extremity: Secondary | ICD-10-CM

## 2012-04-25 ENCOUNTER — Ambulatory Visit (INDEPENDENT_AMBULATORY_CARE_PROVIDER_SITE_OTHER): Payer: BC Managed Care – PPO | Admitting: Cardiovascular Disease

## 2012-04-25 ENCOUNTER — Encounter: Payer: Self-pay | Admitting: Cardiovascular Disease

## 2012-04-25 VITALS — BP 120/60 | HR 74 | Ht 64.5 in | Wt 233.0 lb

## 2012-04-25 DIAGNOSIS — I739 Peripheral vascular disease, unspecified: Secondary | ICD-10-CM

## 2012-04-25 DIAGNOSIS — I10 Essential (primary) hypertension: Secondary | ICD-10-CM

## 2012-04-25 MED ORDER — CILOSTAZOL 50 MG PO TABS: 50.0000 mg | ORAL_TABLET | Freq: Two times a day (BID) | ORAL | Status: DC

## 2012-04-25 NOTE — Patient Instructions (Addendum)
Your physician has recommended you make the following change in your medication: start taking Pletal 50 mg twice daily  Your physician recommends that you schedule a follow-up appointment in: 1 month

## 2012-04-26 ENCOUNTER — Other Ambulatory Visit (INDEPENDENT_AMBULATORY_CARE_PROVIDER_SITE_OTHER): Payer: BC Managed Care – PPO

## 2012-04-26 DIAGNOSIS — D649 Anemia, unspecified: Secondary | ICD-10-CM

## 2012-04-26 LAB — CBC WITH DIFFERENTIAL/PLATELET
Basophils Relative: 0.4 % (ref 0.0–3.0)
Eosinophils Relative: 2 % (ref 0.0–5.0)
HCT: 38.3 % (ref 36.0–46.0)
Hemoglobin: 12.5 g/dL (ref 12.0–15.0)
Lymphs Abs: 1.8 10*3/uL (ref 0.7–4.0)
MCV: 90.7 fl (ref 78.0–100.0)
Monocytes Absolute: 0.7 10*3/uL (ref 0.1–1.0)
Monocytes Relative: 8.3 % (ref 3.0–12.0)
Neutro Abs: 5.8 10*3/uL (ref 1.4–7.7)
Platelets: 208 10*3/uL (ref 150.0–400.0)
WBC: 8.5 10*3/uL (ref 4.5–10.5)

## 2012-04-26 LAB — FERRITIN: Ferritin: 73.4 ng/mL (ref 10.0–291.0)

## 2012-04-26 NOTE — Assessment & Plan Note (Signed)
The patient has severe lifestyle limiting claudication mostly in the left calf. There is no convincing evidence of rest pain and no ulceration. Thus, she is in Rutherford class 3. her ABI has worsened significantly over the last years. He was mildly reduced on the right side and 0.4 on the left side with evidence of inflow disease as well as 2 focal stenoses in the proximal and mid left SFA. Her previous ABI on the left side was 0.7. I discussed with the patient the natural history and treatment of peripheral arterial disease. Given the severity of her symptoms, I would have recommended proceeding with angiography directly. However, she has advanced chronic kidney disease. Thus, I will start her on Pletal 50 mg twice daily and discussed with her starting a walking exercise program. I will have her followup with me in one month to reevaluate her symptoms and make sure there is no worsening. If we decided to proceed with angiography, we'll likely perform with CO2 instead of IV contrast given that the lesions are in the iliac and femoral arteries. She is to continue current medical therapy.

## 2012-04-26 NOTE — Progress Notes (Signed)
HPI  [name] is a pleasant 56 year old female who is here today for evaluation and management of peripheral arterial disease. She has known history of  coronary artery status post coronary bypass and graft in 2004. Carotid Dopplers in April of 2011 showed 40-59% bilateral stenosis. She also has prolonged history of diabetes with advanced chronic kidney disease and possible diabetic neuropathy. 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 It's mainly in the calf bilaterally worse on the left side and currently with less than 50 feet of walking. She has numbness chest but that seems to be likely due to her neuropathy. There is no lower extremity ulceration. She cannot do much exercise due to her symptoms of claudication.  Allergies  Allergen Reactions  . Percocet (Oxycodone-Acetaminophen) Shortness Of Breath     Current Outpatient Prescriptions on File Prior to Visit  Medication Sig Dispense Refill  . albuterol (PROVENTIL HFA;VENTOLIN HFA) 108 (90 BASE) MCG/ACT inhaler Inhale 2 puffs into the lungs every 6 (six) hours as needed.        . ALPRAZolam (XANAX) 1 MG tablet 1 tablet as needed.      Marland Kitchen aspirin 81 MG tablet Take 81 mg by mouth daily.        Marland Kitchen atorvastatin (LIPITOR) 80 MG tablet Take 80 mg by mouth daily.        . fenofibrate micronized (LOFIBRA) 200 MG capsule Take 1 capsule (200 mg total) by mouth daily before breakfast.  30 capsule  2  . ferrous sulfate 325 (65 FE) MG EC tablet Take 325 mg by mouth daily with breakfast.       . furosemide (LASIX) 40 MG tablet 1 tablet 2 (two) times daily.      Marland Kitchen HUMALOG 100 UNIT/ML injection every morning.      . insulin glargine (LANTUS) 100 UNIT/ML injection Inject 55 Units into the skin 2 (two) times daily. Take ac breakfast and evening meal      . levothyroxine (SYNTHROID) 88 MCG tablet Take 88 mcg by mouth daily.      . metoprolol (LOPRESSOR) 50 MG tablet TAKE 1 TABLET (50 MG TOTAL)  BY MOUTH 2 (TWO) TIMES DAILY.  60 tablet  6  . nystatin-triamcinolone (MYCOLOG II) cream Apply 1 application topically as needed.       Marland Kitchen omeprazole (PRILOSEC) 20 MG capsule Take 40 mg by mouth 2 (two) times daily.       . ondansetron (ZOFRAN) 4 MG tablet Take 1 tablet (4 mg total) by mouth every 6 (six) hours as needed for nausea (every 4 - 6 hours PRN).  12 tablet  0  . sertraline (ZOLOFT) 100 MG tablet Take 100 mg by mouth 2 (two) times daily.          Past Medical History  Diagnosis Date  . Hypertension   . GERD (gastroesophageal reflux disease)   . Chronic renal insufficiency   . Diabetes mellitus     type II  . Diverticulosis of colon (without mention of hemorrhage)   . Depression with anxiety   . PVD (peripheral vascular disease)   . Hyperlipidemia   . Coronary artery disease   . Carpal tunnel syndrome   . Asthma   . Thyroid disease     hypo  . Adenomatous polyp of colon   . Depression   . Anemia   . Obesity      Past Surgical History  Procedure Date  .  Cholecystectomy   . Coronary artery bypass graft     x4  . Appendectomy   . Knee surgery   . Abdominal hysterectomy      Family History  Problem Relation Age of Onset  . Diabetes Mother   . Heart disease Father   . Colon cancer Neg Hx   . Esophageal cancer Neg Hx   . Stomach cancer Neg Hx   . Colon polyps Daughter      History   Social History  . Marital Status: Widowed    Spouse Name: N/A    Number of Children: 2  . Years of Education: N/A   Occupational History  . [employer] History Main Topics  . Smoking status: Former Research scientist (life sciences)  . Smokeless tobacco: Never Used  . Alcohol Use: No  . Drug Use: No  . Sexually Active: Not on file   Other Topics Concern  . Not on file   Social History Narrative   Daily Caffeine      PHYSICAL EXAM   BP 120/60  Pulse 74  Ht 5' 4.5" (1.638 m)  Wt 233 lb (105.688 kg)  BMI 39.38 kg/m2 Constitutional: She is oriented  to person, place, and time. She appears well-developed and well-nourished. No distress.  HENT: No nasal discharge.  Head: Normocephalic and atraumatic.  Eyes: Pupils are equal and round. Right eye exhibits no discharge. Left eye exhibits no discharge.  Neck: Normal range of motion. Neck supple. No JVD present. No thyromegaly present.  Cardiovascular: Normal rate, regular rhythm, normal heart sounds. Exam reveals no gallop and no friction rub. No murmur heard.  Pulmonary/Chest: Effort normal and breath sounds normal. No stridor. No respiratory distress. She has no wheezes. She has no rales. She exhibits no tenderness.  Abdominal: Soft. Bowel sounds are normal. She exhibits no distension. There is no tenderness. There is no rebound and no guarding.  Musculoskeletal: Normal range of motion. She exhibits no edema and no tenderness.  Neurological: She is alert and oriented to person, place, and time. Coordination normal.  Skin: Skin is warm and dry. No rash noted. She is not diaphoretic. No erythema. No pallor.  Psychiatric: She has a normal mood and affect. Her behavior is normal. Judgment and thought content normal.  Vascular: Femoral pulse: +1 on the right side and absent on the left side. Distal pulses are not palpable. Radial pulses are diminished bilaterally. There is obvious dependent rubor in the left feet.   EKG: Normal sinus rhythm with nonspecific T wave changes. Mildly prolonged QT interval.   ASSESSMENT AND PLAN

## 2012-05-18 ENCOUNTER — Encounter: Payer: Self-pay | Admitting: Cardiology

## 2012-05-18 ENCOUNTER — Encounter (INDEPENDENT_AMBULATORY_CARE_PROVIDER_SITE_OTHER): Payer: BC Managed Care – PPO

## 2012-05-18 ENCOUNTER — Ambulatory Visit (INDEPENDENT_AMBULATORY_CARE_PROVIDER_SITE_OTHER): Payer: BC Managed Care – PPO | Admitting: Cardiology

## 2012-05-18 VITALS — BP 148/78 | HR 78 | Wt 233.0 lb

## 2012-05-18 DIAGNOSIS — E785 Hyperlipidemia, unspecified: Secondary | ICD-10-CM

## 2012-05-18 DIAGNOSIS — I739 Peripheral vascular disease, unspecified: Secondary | ICD-10-CM

## 2012-05-18 DIAGNOSIS — I6529 Occlusion and stenosis of unspecified carotid artery: Secondary | ICD-10-CM

## 2012-05-18 DIAGNOSIS — I10 Essential (primary) hypertension: Secondary | ICD-10-CM

## 2012-05-18 DIAGNOSIS — N259 Disorder resulting from impaired renal tubular function, unspecified: Secondary | ICD-10-CM

## 2012-05-18 DIAGNOSIS — I2510 Atherosclerotic heart disease of native coronary artery without angina pectoris: Secondary | ICD-10-CM

## 2012-05-18 NOTE — Patient Instructions (Addendum)
Your physician wants you to follow-up in: ONE YEAR WITH DR CRENSHAW You will receive a reminder letter in the mail two months in advance. If you don't receive a letter, please call our office to schedule the follow-up appointment.  

## 2012-05-18 NOTE — Assessment & Plan Note (Signed)
Continue aspirin and statin. Most recent nuclear study normal.

## 2012-05-18 NOTE — Assessment & Plan Note (Signed)
Continue present blood pressure medications. She would benefit from an ARB but I do not have records on her recent renal function. She will followup with nephrology and primary care physician.

## 2012-05-18 NOTE — Assessment & Plan Note (Signed)
Continue statin. Lipids and liver monitored by primary care. 

## 2012-05-18 NOTE — Assessment & Plan Note (Signed)
I have encouraged her to followup with nephrology concerning this issue.

## 2012-05-18 NOTE — Assessment & Plan Note (Signed)
Continue aspirin and statin. Patient had followup carotid Dopplers today with results pending.

## 2012-05-18 NOTE — Progress Notes (Signed)
HPI: [name] is a pleasant female who has a history of coronary artery status post coronary bypass and graft in 2004. Her last Myoview was performed in July of 2012. At that time, her ejection fraction was 75%; perfusion normal. Carotid Dopplers in July 2012 showed 40-59% bilateral stenosis. Followup was recommended in one year. ABIs in Oct 2013 revealed mildly reduction on the right side and 0.4 on the left side with evidence of inflow disease as well as 2 focal stenoses in the proximal and mid left SFA. Patient seen by Dr. Fletcher Anon and pletal initiated. If symptoms do not improve file was given to CO2 angiogram. She does have significant renal insufficiency. Since I last saw her there is no chest pain. She has dyspnea with more extreme activities but not routine activities. No orthopnea or PND but there is occasional pedal edema. She does have claudication but this is improved since initiating pletal.    Current Outpatient Prescriptions  Medication Sig Dispense Refill  . albuterol (PROVENTIL HFA;VENTOLIN HFA) 108 (90 BASE) MCG/ACT inhaler Inhale 2 puffs into the lungs every 6 (six) hours as needed.        . ALPRAZolam (XANAX) 1 MG tablet 1 tablet as needed.      Marland Kitchen aspirin 81 MG tablet Take 81 mg by mouth daily.        Marland Kitchen atorvastatin (LIPITOR) 80 MG tablet Take 80 mg by mouth daily.        . cilostazol (PLETAL) 50 MG tablet Take 1 tablet (50 mg total) by mouth 2 (two) times daily.  60 tablet  3  . fenofibrate micronized (LOFIBRA) 200 MG capsule Take 1 capsule (200 mg total) by mouth daily before breakfast.  30 capsule  2  . ferrous sulfate 325 (65 FE) MG EC tablet Take 325 mg by mouth daily with breakfast.       . furosemide (LASIX) 40 MG tablet 1 tablet 2 (two) times daily.      Marland Kitchen HUMALOG 100 UNIT/ML injection every morning.      . insulin glargine (LANTUS) 100 UNIT/ML injection Inject 55 Units into the skin 2 (two) times daily. Take ac breakfast and evening meal      . levothyroxine (SYNTHROID)  88 MCG tablet Take 88 mcg by mouth daily.      . metoprolol (LOPRESSOR) 50 MG tablet TAKE 1 TABLET (50 MG TOTAL) BY MOUTH 2 (TWO) TIMES DAILY.  60 tablet  6  . nystatin-triamcinolone (MYCOLOG II) cream Apply 1 application topically as needed.       Marland Kitchen omeprazole (PRILOSEC) 20 MG capsule Take 40 mg by mouth 2 (two) times daily.       Marland Kitchen oxyCODONE-acetaminophen (PERCOCET) 7.5-325 MG per tablet Take 1 tablet by mouth 4 (four) times daily.       . sertraline (ZOLOFT) 100 MG tablet Take 100 mg by mouth 2 (two) times daily.          Past Medical History  Diagnosis Date  . Hypertension   . GERD (gastroesophageal reflux disease)   . Chronic renal insufficiency   . Diabetes mellitus     type II  . Diverticulosis of colon (without mention of hemorrhage)   . Depression with anxiety   . PVD (peripheral vascular disease)   . Hyperlipidemia   . Coronary artery disease   . Carpal tunnel syndrome   . Asthma   . Thyroid disease     hypo  . Adenomatous polyp of colon   .  Depression   . Anemia   . Obesity     Past Surgical History  Procedure Date  . Cholecystectomy   . Coronary artery bypass graft     x4  . Appendectomy   . Knee surgery   . Abdominal hysterectomy     History   Social History  . Marital Status: Widowed    Spouse Name: N/A    Number of Children: 2  . Years of Education: N/A   Occupational History  . [employer] History Main Topics  . Smoking status: Former Research scientist (life sciences)  . Smokeless tobacco: Never Used  . Alcohol Use: No  . Drug Use: No  . Sexually Active: Not on file   Other Topics Concern  . Not on file   Social History Narrative   Daily Caffeine    ROS: no fevers or chills, productive cough, hemoptysis, dysphasia, odynophagia, melena, hematochezia, dysuria, hematuria, rash, seizure activity, orthopnea, PND, pedal edema, claudication. Remaining systems are negative.  Physical Exam: Well-developed well-nourished in no acute  distress.  Skin is warm and dry.  HEENT is normal.  Neck is supple.  Chest is clear to auscultation with normal expansion. Previous sternotomy Cardiovascular exam is regular rate and rhythm.  Abdominal exam nontender or distended. No masses palpated. Extremities show no edema. neuro grossly intact  ECG 04/25/2012-sinus rhythm, nonspecific ST changes, prolonged QT interval.

## 2012-05-18 NOTE — Assessment & Plan Note (Signed)
Continue aspirin, statin and pletal. If symptoms progress she may need an arteriogram. She is followed by Dr. Fletcher Anon for this.

## 2012-05-24 ENCOUNTER — Other Ambulatory Visit: Payer: Self-pay | Admitting: Family Medicine

## 2012-05-24 DIAGNOSIS — R112 Nausea with vomiting, unspecified: Secondary | ICD-10-CM

## 2012-05-24 DIAGNOSIS — R1314 Dysphagia, pharyngoesophageal phase: Secondary | ICD-10-CM

## 2012-05-28 ENCOUNTER — Other Ambulatory Visit: Payer: Self-pay | Admitting: Family Medicine

## 2012-05-28 DIAGNOSIS — Z1231 Encounter for screening mammogram for malignant neoplasm of breast: Secondary | ICD-10-CM

## 2012-06-04 ENCOUNTER — Inpatient Hospital Stay: Admission: RE | Admit: 2012-06-04 | Payer: BC Managed Care – PPO | Source: Ambulatory Visit

## 2012-06-06 ENCOUNTER — Ambulatory Visit: Payer: BC Managed Care – PPO | Admitting: Cardiovascular Disease

## 2012-06-06 ENCOUNTER — Ambulatory Visit: Payer: BC Managed Care – PPO

## 2012-06-11 ENCOUNTER — Ambulatory Visit: Payer: BC Managed Care – PPO

## 2012-07-12 ENCOUNTER — Ambulatory Visit: Payer: BC Managed Care – PPO

## 2012-07-18 ENCOUNTER — Encounter: Payer: Self-pay | Admitting: Cardiovascular Disease

## 2012-07-18 ENCOUNTER — Ambulatory Visit (INDEPENDENT_AMBULATORY_CARE_PROVIDER_SITE_OTHER): Payer: BC Managed Care – PPO | Admitting: Cardiovascular Disease

## 2012-07-18 VITALS — BP 138/62 | HR 83 | Ht 64.5 in | Wt 223.0 lb

## 2012-07-18 DIAGNOSIS — I739 Peripheral vascular disease, unspecified: Secondary | ICD-10-CM

## 2012-07-18 MED ORDER — CILOSTAZOL 50 MG PO TABS: 50.0000 mg | ORAL_TABLET | Freq: Two times a day (BID) | ORAL | Status: DC

## 2012-07-18 NOTE — Progress Notes (Signed)
HPI  [name] is a pleasant 57 year old female who is here today for a followup visit regarding peripheral arterial disease. She has known history of  coronary artery status post coronary bypass graft surgery in 2004. Carotid Dopplers in April of 2011 showed 40-59% bilateral stenosis. She also has prolonged history of diabetes with advanced chronic kidney disease and possible diabetic neuropathy. 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 During initial evaluation, she reported bilateral calf claudication worse on the left side with less than 50 feet of walking.  Due to her advanced chronic kidney disease, I decided to proceed with the initial medical therapy and a walking exercise program. She was started on Pletal 50 mg twice daily. She started walking regularly after that and within 2 weeks, she noticed significant improvement in her claudication. She is able to walk more than 200 feet without having to stop. She is able to do her shopping and regular everyday activities without significant limitations at this time.  Allergies  Allergen Reactions  . Percocet (Oxycodone-Acetaminophen) Shortness Of Breath     Current Outpatient Prescriptions on File Prior to Visit  Medication Sig Dispense Refill  . albuterol (PROVENTIL HFA;VENTOLIN HFA) 108 (90 BASE) MCG/ACT inhaler Inhale 2 puffs into the lungs every 6 (six) hours as needed.        . ALPRAZolam (XANAX) 1 MG tablet 1 tablet as needed.      Marland Kitchen aspirin 81 MG tablet Take 81 mg by mouth daily.        Marland Kitchen atorvastatin (LIPITOR) 80 MG tablet Take 80 mg by mouth daily.        . fenofibrate micronized (LOFIBRA) 200 MG capsule Take 1 capsule (200 mg total) by mouth daily before breakfast.  30 capsule  2  . ferrous sulfate 325 (65 FE) MG EC tablet Take 325 mg by mouth daily with breakfast.       . furosemide (LASIX) 40 MG tablet 1 tablet 2 (two) times daily.      Marland Kitchen HUMALOG 100 UNIT/ML  injection every morning.      . insulin glargine (LANTUS) 100 UNIT/ML injection Inject 55 Units into the skin 2 (two) times daily. Take ac breakfast and evening meal      . levothyroxine (SYNTHROID) 88 MCG tablet Take 88 mcg by mouth daily.      . metoprolol (LOPRESSOR) 50 MG tablet TAKE 1 TABLET (50 MG TOTAL) BY MOUTH 2 (TWO) TIMES DAILY.  60 tablet  6  . nystatin-triamcinolone (MYCOLOG II) cream Apply 1 application topically as needed.       Marland Kitchen omeprazole (PRILOSEC) 20 MG capsule Take 40 mg by mouth 2 (two) times daily.       Marland Kitchen oxyCODONE-acetaminophen (PERCOCET) 7.5-325 MG per tablet Take 1 tablet by mouth 4 (four) times daily.       . sertraline (ZOLOFT) 100 MG tablet Take 100 mg by mouth 2 (two) times daily.          Past Medical History  Diagnosis Date  . Hypertension   . GERD (gastroesophageal reflux disease)   . Chronic renal insufficiency   . Diabetes mellitus     type II  . Diverticulosis of colon (without mention of hemorrhage)   . Depression with anxiety   . PVD (peripheral vascular disease)   . Hyperlipidemia   . Coronary artery disease   . Carpal tunnel syndrome   . Asthma   . Thyroid  disease     hypo  . Adenomatous polyp of colon   . Depression   . Anemia   . Obesity      Past Surgical History  Procedure Date  . Cholecystectomy   . Coronary artery bypass graft     x4  . Appendectomy   . Knee surgery   . Abdominal hysterectomy      Family History  Problem Relation Age of Onset  . Diabetes Mother   . Heart disease Father   . Colon cancer Neg Hx   . Esophageal cancer Neg Hx   . Stomach cancer Neg Hx   . Colon polyps Daughter      History   Social History  . Marital Status: Widowed    Spouse Name: N/A    Number of Children: 2  . Years of Education: N/A   Occupational History  . [place] History Main Topics  . Smoking status: Former Research scientist (life sciences)  . Smokeless tobacco: Never Used  . Alcohol Use: No  . Drug Use:  No  . Sexually Active: Not on file   Other Topics Concern  . Not on file   Social History Narrative   Daily Caffeine      PHYSICAL EXAM   BP 138/62  Pulse 83  Ht 5' 4.5" (1.638 m)  Wt 101.152 kg (223 lb)  BMI 37.69 kg/m2  SpO2 96% Constitutional: She is oriented to person, place, and time. She appears well-developed and well-nourished. No distress.  HENT: No nasal discharge.  Head: Normocephalic and atraumatic.  Eyes: Pupils are equal and round. Right eye exhibits no discharge. Left eye exhibits no discharge.  Neck: Normal range of motion. Neck supple. No JVD present. No thyromegaly present.  Cardiovascular: Normal rate, regular rhythm, normal heart sounds. Exam reveals no gallop and no friction rub. No murmur heard.  Pulmonary/Chest: Effort normal and breath sounds normal. No stridor. No respiratory distress. She has no wheezes. She has no rales. She exhibits no tenderness.  Abdominal: Soft. Bowel sounds are normal. She exhibits no distension. There is no tenderness. There is no rebound and no guarding.  Musculoskeletal: Normal range of motion. She exhibits no edema and no tenderness.  Neurological: She is alert and oriented to person, place, and time. Coordination normal.  Skin: Skin is warm and dry. No rash noted. She is not diaphoretic. No erythema. No pallor.  Psychiatric: She has a normal mood and affect. Her behavior is normal. Judgment and thought content normal.  Vascular:  Distal pulses are not palpable. Radial pulses are diminished bilaterally. The dependent rubor in the left feet which was noted during last visit his not evident now.     ASSESSMENT AND PLAN

## 2012-07-18 NOTE — Patient Instructions (Addendum)
Your physician wants you to follow-up in: 6 months, one week after your abi test.   You will receive a reminder letter in the mail two months in advance. If you don't receive a letter, please call our office to schedule the follow-up appointment.  Your physician has requested that you have an ankle brachial index (ABI) in 6 months.  . During this test an ultrasound and blood pressure cuff are used to evaluate the arteries that supply the arms and legs with blood. Allow thirty minutes for this exam. There are no restrictions or special instructions.

## 2012-07-18 NOTE — Assessment & Plan Note (Signed)
The patient reported severe lifestyle limiting claudication mostly in the left calf during initial evaluation. Her ABI has worsened significantly over the last few years. ABI was 0.4 on the left side with evidence of inflow disease as well as 2 focal stenoses in the proximal and mid left SFA. Her previous ABI on the left side was 0.7. She responded very well to treatment with Pletal and starting an exercise program. She reports that her claudication is now mild and she is able to perform all desired activities of daily living. Due to that, I recommend continuing medical therapy. She is on optimal medications for hyperlipidemia. She also reports improved diabetes control. I advised her to continue with a regular walking program. I will repeat ABI in 6 months and have her followup with me at that time.

## 2012-07-30 ENCOUNTER — Other Ambulatory Visit: Payer: Self-pay | Admitting: Internal Medicine

## 2012-07-30 DIAGNOSIS — D649 Anemia, unspecified: Secondary | ICD-10-CM

## 2012-08-03 ENCOUNTER — Other Ambulatory Visit (INDEPENDENT_AMBULATORY_CARE_PROVIDER_SITE_OTHER): Payer: BC Managed Care – PPO

## 2012-08-03 DIAGNOSIS — D649 Anemia, unspecified: Secondary | ICD-10-CM

## 2012-08-03 LAB — CBC WITH DIFFERENTIAL/PLATELET
Basophils Relative: 0.4 % (ref 0.0–3.0)
Eosinophils Absolute: 0.1 10*3/uL (ref 0.0–0.7)
Eosinophils Relative: 1.9 % (ref 0.0–5.0)
HCT: 37.8 % (ref 36.0–46.0)
Lymphs Abs: 1.5 10*3/uL (ref 0.7–4.0)
MCHC: 33.9 g/dL (ref 30.0–36.0)
MCV: 88.7 fl (ref 78.0–100.0)
Monocytes Absolute: 0.5 10*3/uL (ref 0.1–1.0)
Platelets: 208 10*3/uL (ref 150.0–400.0)
RBC: 4.27 Mil/uL (ref 3.87–5.11)
WBC: 7.3 10*3/uL (ref 4.5–10.5)

## 2012-08-06 ENCOUNTER — Other Ambulatory Visit: Payer: Self-pay | Admitting: Internal Medicine

## 2012-08-06 DIAGNOSIS — D649 Anemia, unspecified: Secondary | ICD-10-CM

## 2012-08-15 ENCOUNTER — Ambulatory Visit: Payer: BC Managed Care – PPO

## 2012-09-20 ENCOUNTER — Telehealth: Payer: Self-pay | Admitting: Cardiology

## 2012-09-20 NOTE — Telephone Encounter (Signed)
Walk In Pt Form " Dept Of Transportation" papers Dropped Off For  Completion by Lisabeth Pick to [facility] 09/19/12/KM

## 2012-09-21 ENCOUNTER — Other Ambulatory Visit: Payer: Self-pay | Admitting: Cardiology

## 2012-09-24 ENCOUNTER — Telehealth: Payer: Self-pay | Admitting: *Deleted

## 2012-09-24 NOTE — Telephone Encounter (Signed)
Spoke with pt, aware DMV paperwork at the front desk for pick up

## 2012-10-19 ENCOUNTER — Other Ambulatory Visit: Payer: Self-pay | Admitting: Cardiology

## 2012-12-18 ENCOUNTER — Encounter: Payer: Self-pay | Admitting: Cardiovascular Disease

## 2012-12-18 ENCOUNTER — Ambulatory Visit (INDEPENDENT_AMBULATORY_CARE_PROVIDER_SITE_OTHER): Payer: BC Managed Care – PPO | Admitting: Cardiovascular Disease

## 2012-12-18 VITALS — BP 140/48 | HR 92 | Ht 64.5 in | Wt 218.8 lb

## 2012-12-18 DIAGNOSIS — I739 Peripheral vascular disease, unspecified: Secondary | ICD-10-CM

## 2012-12-18 MED ORDER — CILOSTAZOL 100 MG PO TABS: 100.0000 mg | ORAL_TABLET | Freq: Two times a day (BID) | ORAL | Status: DC

## 2012-12-18 NOTE — Progress Notes (Signed)
HPI  [name] is a pleasant 57 year old female who is here today for a followup visit regarding peripheral arterial disease. She has known history of  coronary artery status post coronary bypass graft surgery in 2004. Carotid Dopplers in April of 2011 showed 40-59% bilateral stenosis. She also has prolonged history of diabetes with advanced chronic kidney disease (creatinine around 3)and possible diabetic neuropathy. 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 During initial evaluation, she reported bilateral calf claudication worse on the left side with less than 50 feet of walking.  Due to her advanced chronic kidney disease, I decided to proceed with the initial medical therapy and a walking exercise program. She was started on Pletal 50 mg twice daily. Her claudication improved for few months but there has gradual worsening over last month. Mild rest pain if she stays in one position. No ulcers or wounds.  Allergies  Allergen Reactions  . Percocet (Oxycodone-Acetaminophen) Shortness Of Breath     Current Outpatient Prescriptions on File Prior to Visit  Medication Sig Dispense Refill  . albuterol (PROVENTIL HFA;VENTOLIN HFA) 108 (90 BASE) MCG/ACT inhaler Inhale 2 puffs into the lungs every 6 (six) hours as needed.        . ALPRAZolam (XANAX) 1 MG tablet 1 tablet as needed.      Marland Kitchen aspirin 81 MG tablet Take 81 mg by mouth daily.        Marland Kitchen atorvastatin (LIPITOR) 80 MG tablet Take 80 mg by mouth daily.        . cilostazol (PLETAL) 50 MG tablet Take 1 tablet (50 mg total) by mouth 2 (two) times daily.  60 tablet  6  . fenofibrate micronized (LOFIBRA) 200 MG capsule Take 1 capsule (200 mg total) by mouth daily before breakfast.  30 capsule  2  . ferrous sulfate 325 (65 FE) MG EC tablet Take 325 mg by mouth daily with breakfast.       . furosemide (LASIX) 40 MG tablet 1 tablet 2 (two) times daily.      Marland Kitchen HUMALOG 100 UNIT/ML injection  every morning.      . insulin glargine (LANTUS) 100 UNIT/ML injection Inject 55 Units into the skin 2 (two) times daily. Take ac breakfast and evening meal      . levothyroxine (SYNTHROID) 88 MCG tablet Take 88 mcg by mouth daily.      . metoprolol (LOPRESSOR) 50 MG tablet TAKE 1 TABLET (50 MG TOTAL) BY MOUTH 2 (TWO) TIMES DAILY.  60 tablet  6  . metoprolol (LOPRESSOR) 50 MG tablet TAKE 1 TABLET (50 MG TOTAL) BY MOUTH 2 (TWO) TIMES DAILY.  60 tablet  6  . nystatin-triamcinolone (MYCOLOG II) cream Apply 1 application topically as needed.       Marland Kitchen omeprazole (PRILOSEC) 20 MG capsule Take 40 mg by mouth 2 (two) times daily.       Marland Kitchen oxyCODONE-acetaminophen (PERCOCET) 7.5-325 MG per tablet Take 1 tablet by mouth 4 (four) times daily.       . sertraline (ZOLOFT) 100 MG tablet Take 100 mg by mouth 2 (two) times daily.        No current facility-administered medications on file prior to visit.     Past Medical History  Diagnosis Date  . Hypertension   . GERD (gastroesophageal reflux disease)   . Chronic renal insufficiency   . Diabetes mellitus     type II  . Diverticulosis of colon (  without mention of hemorrhage)   . Depression with anxiety   . PVD (peripheral vascular disease)   . Hyperlipidemia   . Coronary artery disease   . Carpal tunnel syndrome   . Asthma   . Thyroid disease     hypo  . Adenomatous polyp of colon   . Depression   . Anemia   . Obesity      Past Surgical History  Procedure Laterality Date  . Cholecystectomy    . Coronary artery bypass graft      x4  . Appendectomy    . Knee surgery    . Abdominal hysterectomy       Family History  Problem Relation Age of Onset  . Diabetes Mother   . Heart disease Father   . Colon cancer Neg Hx   . Esophageal cancer Neg Hx   . Stomach cancer Neg Hx   . Colon polyps Daughter      History   Social History  . Marital Status: Widowed    Spouse Name: N/A    Number of Children: 2  . Years of Education: N/A    Occupational History  . [place] History Main Topics  . Smoking status: Former Research scientist (life sciences)  . Smokeless tobacco: Never Used  . Alcohol Use: No  . Drug Use: No  . Sexually Active: Not on file   Other Topics Concern  . Not on file   Social History Narrative   Daily Caffeine      PHYSICAL EXAM   BP 140/48  Pulse 92  Ht 5' 4.5" (1.638 m)  Wt 218 lb 12.8 oz (99.247 kg)  BMI 36.99 kg/m2  SpO2 96% Constitutional: She is oriented to person, place, and time. She appears well-developed and well-nourished. No distress.  HENT: No nasal discharge.  Head: Normocephalic and atraumatic.  Eyes: Pupils are equal and round. Right eye exhibits no discharge. Left eye exhibits no discharge.  Neck: Normal range of motion. Neck supple. No JVD present. No thyromegaly present.  Cardiovascular: Normal rate, regular rhythm, normal heart sounds. Exam reveals no gallop and no friction rub. No murmur heard.  Pulmonary/Chest: Effort normal and breath sounds normal. No stridor. No respiratory distress. She has no wheezes. She has no rales. She exhibits no tenderness.  Abdominal: Soft. Bowel sounds are normal. She exhibits no distension. There is no tenderness. There is no rebound and no guarding.  Musculoskeletal: Normal range of motion. She exhibits no edema and no tenderness.  Neurological: She is alert and oriented to person, place, and time. Coordination normal.  Skin: Skin is warm and dry. No rash noted. She is not diaphoretic. No erythema. No pallor.  Psychiatric: She has a normal mood and affect. Her behavior is normal. Judgment and thought content normal.  Vascular:  Distal pulses are not palpable. Radial pulses are diminished bilaterally. Femoral pulse: +1 on right and barely palpable on left.     ASSESSMENT AND PLAN

## 2012-12-18 NOTE — Assessment & Plan Note (Signed)
Most recent ABI was 0.4 on the left side with evidence of inflow disease as well as 2 focal stenoses in the proximal and mid left SFA. Her previous ABI on the left side was 0.7. ABI was mildly reduced on right side (0.79) She has severe claudication involving left calf and mild rest pain without ulceration.  She initially responded very well to treatment with Pletal and exercise program. However, she now has worsening symptoms and mild rest pain.  I will increase Pletal to 100 mg bid and reevaluate her symptoms in 3 months. The biggest concern here is advanced CKD. The use of contrast would put her at significant risk of contrast induced nephropathy and progression to ESRD. Having said that, if there is no significant improvement in few months, I will consider proceeding with CO2 angiography and at least iliac intervention to improve inflow.

## 2012-12-18 NOTE — Patient Instructions (Addendum)
Your physician recommends that you schedule a follow-up appointment in: 3 months   Your physician has recommended you make the following change in your medication:   INCREASE PLETAL TO 100 MG TWO TIMES DAILY/ 12 HOURS APART

## 2013-01-15 ENCOUNTER — Other Ambulatory Visit (INDEPENDENT_AMBULATORY_CARE_PROVIDER_SITE_OTHER): Payer: BC Managed Care – PPO

## 2013-01-15 DIAGNOSIS — D649 Anemia, unspecified: Secondary | ICD-10-CM

## 2013-01-15 LAB — CBC WITH DIFFERENTIAL/PLATELET
Basophils Absolute: 0 10*3/uL (ref 0.0–0.1)
Eosinophils Absolute: 0.2 10*3/uL (ref 0.0–0.7)
Lymphocytes Relative: 20 % (ref 12.0–46.0)
MCHC: 34.4 g/dL (ref 30.0–36.0)
MCV: 89.5 fl (ref 78.0–100.0)
Monocytes Absolute: 0.5 10*3/uL (ref 0.1–1.0)
Neutrophils Relative %: 70.9 % (ref 43.0–77.0)
Platelets: 254 10*3/uL (ref 150.0–400.0)

## 2013-01-16 ENCOUNTER — Other Ambulatory Visit: Payer: Self-pay | Admitting: Internal Medicine

## 2013-01-16 DIAGNOSIS — D649 Anemia, unspecified: Secondary | ICD-10-CM

## 2013-01-29 ENCOUNTER — Ambulatory Visit: Payer: BC Managed Care – PPO | Admitting: Cardiovascular Disease

## 2013-03-19 ENCOUNTER — Ambulatory Visit: Payer: BC Managed Care – PPO | Admitting: Cardiovascular Disease

## 2013-04-09 ENCOUNTER — Ambulatory Visit: Payer: BC Managed Care – PPO | Admitting: Cardiovascular Disease

## 2013-04-23 ENCOUNTER — Ambulatory Visit: Payer: BC Managed Care – PPO | Admitting: Cardiovascular Disease

## 2013-05-07 ENCOUNTER — Ambulatory Visit (INDEPENDENT_AMBULATORY_CARE_PROVIDER_SITE_OTHER): Payer: BC Managed Care – PPO | Admitting: Cardiovascular Disease

## 2013-05-07 ENCOUNTER — Encounter: Payer: Self-pay | Admitting: Cardiovascular Disease

## 2013-05-07 ENCOUNTER — Encounter (INDEPENDENT_AMBULATORY_CARE_PROVIDER_SITE_OTHER): Payer: Self-pay

## 2013-05-07 VITALS — BP 136/62 | HR 79 | Ht 64.5 in | Wt 202.0 lb

## 2013-05-07 DIAGNOSIS — I6529 Occlusion and stenosis of unspecified carotid artery: Secondary | ICD-10-CM

## 2013-05-07 DIAGNOSIS — I2510 Atherosclerotic heart disease of native coronary artery without angina pectoris: Secondary | ICD-10-CM

## 2013-05-07 DIAGNOSIS — I739 Peripheral vascular disease, unspecified: Secondary | ICD-10-CM

## 2013-05-07 NOTE — Assessment & Plan Note (Signed)
Most recent ABI was 0.4 on the left side with evidence of inflow disease as well as 2 focal stenoses in the proximal and mid left SFA. Her previous ABI on the left side was 0.7. ABI was mildly reduced on right side (0.79) She has severe claudication involving left calf. This improved significantly with Pletal and a walking program. No rest pain or ulceration.   Continue medical therapy.

## 2013-05-07 NOTE — Assessment & Plan Note (Signed)
Moderate bilateral disease with bruits. She is due to carotid duplex.

## 2013-05-07 NOTE — Patient Instructions (Signed)
Your physician has requested that you have a carotid duplex in NOVEMBER. This test is an ultrasound of the carotid arteries in your neck. It looks at blood flow through these arteries that supply the brain with blood. Allow one hour for this exam. There are no restrictions or special instructions.  Your physician recommends that you continue on your current medications as directed. Please refer to the Current Medication list given to you today.  Your physician wants you to follow-up in: 6 MONTHS with Dr Fletcher Anon.  You will receive a reminder letter in the mail two months in advance. If you don't receive a letter, please call our office to schedule the follow-up appointment.

## 2013-05-07 NOTE — Progress Notes (Signed)
HPI  [name] is a pleasant 57 year old female who is here today for a followup visit regarding peripheral arterial disease. She has known history of  coronary artery status post coronary bypass graft surgery in 2004. Carotid Dopplers in April of 2011 showed 40-59% bilateral stenosis which has been stable. She also has prolonged history of diabetes with advanced chronic kidney disease (creatinine around 3)and possible diabetic neuropathy. 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 During initial evaluation, she reported bilateral calf claudication worse on the left side with less than 50 feet of walking.  Due to her advanced chronic kidney disease, I decided to proceed with the initial medical therapy and a walking exercise program. She was started on Pletal 50 mg twice daily. This was increased to 100 mg bid. Since increasing the dose, she reports improvement in symptoms. No recent pain or ulceration.     No Active Allergies   Current Outpatient Prescriptions on File Prior to Visit  Medication Sig Dispense Refill  . albuterol (PROVENTIL HFA;VENTOLIN HFA) 108 (90 BASE) MCG/ACT inhaler Inhale 2 puffs into the lungs every 6 (six) hours as needed.        . ALPRAZolam (XANAX) 1 MG tablet 1 tablet as needed.      Marland Kitchen aspirin 81 MG tablet Take 81 mg by mouth daily.        Marland Kitchen atorvastatin (LIPITOR) 80 MG tablet Take 80 mg by mouth daily.        . cilostazol (PLETAL) 100 MG tablet Take 1 tablet (100 mg total) by mouth 2 (two) times daily.  60 tablet  6  . fenofibrate micronized (LOFIBRA) 200 MG capsule Take 1 capsule (200 mg total) by mouth daily before breakfast.  30 capsule  2  . ferrous sulfate 325 (65 FE) MG EC tablet Take 325 mg by mouth daily with breakfast.       . furosemide (LASIX) 40 MG tablet 1 tablet 2 (two) times daily.      Marland Kitchen HUMALOG 100 UNIT/ML injection every morning.      . insulin glargine (LANTUS) 100 UNIT/ML injection  Inject 55 Units into the skin 2 (two) times daily. Take ac breakfast and evening meal      . levothyroxine (SYNTHROID) 88 MCG tablet Take 88 mcg by mouth daily.      . metoprolol (LOPRESSOR) 50 MG tablet TAKE 1 TABLET (50 MG TOTAL) BY MOUTH 2 (TWO) TIMES DAILY.  60 tablet  6  . metoprolol (LOPRESSOR) 50 MG tablet TAKE 1 TABLET (50 MG TOTAL) BY MOUTH 2 (TWO) TIMES DAILY.  60 tablet  6  . nystatin-triamcinolone (MYCOLOG II) cream Apply 1 application topically as needed.       Marland Kitchen omeprazole (PRILOSEC) 20 MG capsule Take 40 mg by mouth 2 (two) times daily.       Marland Kitchen oxyCODONE-acetaminophen (PERCOCET) 7.5-325 MG per tablet Take 1 tablet by mouth 4 (four) times daily.       . sertraline (ZOLOFT) 100 MG tablet Take 100 mg by mouth 2 (two) times daily.        No current facility-administered medications on file prior to visit.     Past Medical History  Diagnosis Date  . Hypertension   . GERD (gastroesophageal reflux disease)   . Chronic renal insufficiency   . Diabetes mellitus     type II  . Diverticulosis of colon (without mention of hemorrhage)   . Depression with  anxiety   . PVD (peripheral vascular disease)   . Hyperlipidemia   . Coronary artery disease   . Carpal tunnel syndrome   . Asthma   . Thyroid disease     hypo  . Adenomatous polyp of colon   . Depression   . Anemia   . Obesity      Past Surgical History  Procedure Laterality Date  . Cholecystectomy    . Coronary artery bypass graft      x4  . Appendectomy    . Knee surgery    . Abdominal hysterectomy       Family History  Problem Relation Age of Onset  . Diabetes Mother   . Heart disease Father   . Colon cancer Neg Hx   . Esophageal cancer Neg Hx   . Stomach cancer Neg Hx   . Colon polyps Daughter      History   Social History  . Marital Status: Widowed    Spouse Name: N/A    Number of Children: 2  . Years of Education: N/A   Occupational History  . [place]  History Main Topics  . Smoking status: Former Research scientist (life sciences)  . Smokeless tobacco: Never Used  . Alcohol Use: No  . Drug Use: No  . Sexual Activity: Not on file   Other Topics Concern  . Not on file   Social History Narrative   Daily Caffeine      PHYSICAL EXAM   BP 136/62  Pulse 79  Ht 5' 4.5" (1.638 m)  Wt 202 lb (91.627 kg)  BMI 34.15 kg/m2 Constitutional: She is oriented to person, place, and time. She appears well-developed and well-nourished. No distress.  HENT: No nasal discharge.  Head: Normocephalic and atraumatic.  Eyes: Pupils are equal and round. Right eye exhibits no discharge. Left eye exhibits no discharge.  Neck: Normal range of motion. Neck supple. No JVD present. No thyromegaly present. Bilateral carotid bruits.  Cardiovascular: Normal rate, regular rhythm, normal heart sounds. Exam reveals no gallop and no friction rub. No murmur heard.  Pulmonary/Chest: Effort normal and breath sounds normal. No stridor. No respiratory distress. She has no wheezes. She has no rales. She exhibits no tenderness.  Abdominal: Soft. Bowel sounds are normal. She exhibits no distension. There is no tenderness. There is no rebound and no guarding.  Musculoskeletal: Normal range of motion. She exhibits no edema and no tenderness.  Neurological: She is alert and oriented to person, place, and time. Coordination normal.  Skin: Skin is warm and dry. No rash noted. She is not diaphoretic. No erythema. No pallor.  Psychiatric: She has a normal mood and affect. Her behavior is normal. Judgment and thought content normal.  Vascular:  Distal pulses are not palpable. Radial pulses are diminished bilaterally. Femoral pulse: +1 on right and barely palpable on left.   EKG: NSR with non specific STTW changes.   ASSESSMENT AND PLAN

## 2013-05-14 ENCOUNTER — Ambulatory Visit (HOSPITAL_COMMUNITY): Payer: BC Managed Care – PPO | Attending: Cardiology

## 2013-05-14 DIAGNOSIS — I739 Peripheral vascular disease, unspecified: Secondary | ICD-10-CM | POA: Insufficient documentation

## 2013-05-14 DIAGNOSIS — Z87891 Personal history of nicotine dependence: Secondary | ICD-10-CM | POA: Insufficient documentation

## 2013-05-14 DIAGNOSIS — I10 Essential (primary) hypertension: Secondary | ICD-10-CM | POA: Insufficient documentation

## 2013-05-14 DIAGNOSIS — I658 Occlusion and stenosis of other precerebral arteries: Secondary | ICD-10-CM | POA: Insufficient documentation

## 2013-05-14 DIAGNOSIS — E119 Type 2 diabetes mellitus without complications: Secondary | ICD-10-CM | POA: Insufficient documentation

## 2013-05-14 DIAGNOSIS — I2510 Atherosclerotic heart disease of native coronary artery without angina pectoris: Secondary | ICD-10-CM | POA: Insufficient documentation

## 2013-05-14 DIAGNOSIS — E785 Hyperlipidemia, unspecified: Secondary | ICD-10-CM | POA: Insufficient documentation

## 2013-05-14 DIAGNOSIS — Z951 Presence of aortocoronary bypass graft: Secondary | ICD-10-CM | POA: Insufficient documentation

## 2013-05-14 DIAGNOSIS — I6529 Occlusion and stenosis of unspecified carotid artery: Secondary | ICD-10-CM | POA: Insufficient documentation

## 2013-05-21 ENCOUNTER — Ambulatory Visit (HOSPITAL_COMMUNITY): Payer: BC Managed Care – PPO | Attending: Dermatopathology

## 2013-05-21 DIAGNOSIS — I739 Peripheral vascular disease, unspecified: Secondary | ICD-10-CM

## 2013-05-21 DIAGNOSIS — E119 Type 2 diabetes mellitus without complications: Secondary | ICD-10-CM | POA: Insufficient documentation

## 2013-05-21 DIAGNOSIS — Z87891 Personal history of nicotine dependence: Secondary | ICD-10-CM | POA: Insufficient documentation

## 2013-05-21 DIAGNOSIS — E785 Hyperlipidemia, unspecified: Secondary | ICD-10-CM | POA: Insufficient documentation

## 2013-05-21 DIAGNOSIS — Z951 Presence of aortocoronary bypass graft: Secondary | ICD-10-CM | POA: Insufficient documentation

## 2013-05-21 DIAGNOSIS — I2510 Atherosclerotic heart disease of native coronary artery without angina pectoris: Secondary | ICD-10-CM | POA: Insufficient documentation

## 2013-05-21 DIAGNOSIS — I10 Essential (primary) hypertension: Secondary | ICD-10-CM | POA: Insufficient documentation

## 2013-05-24 ENCOUNTER — Encounter: Payer: Self-pay | Admitting: Cardiovascular Disease

## 2013-05-24 NOTE — Telephone Encounter (Signed)
Follow Up  Pt returning calling about results

## 2013-05-24 NOTE — Telephone Encounter (Signed)
This encounter was created in error - please disregard.

## 2013-05-24 NOTE — Telephone Encounter (Signed)
Left message on machine for pt to contact the office.   

## 2013-05-24 NOTE — Telephone Encounter (Signed)
Will forward to [employer]

## 2013-07-05 ENCOUNTER — Other Ambulatory Visit: Payer: Self-pay | Admitting: Cardiovascular Disease

## 2013-08-09 ENCOUNTER — Other Ambulatory Visit: Payer: Self-pay

## 2013-08-09 DIAGNOSIS — Z1231 Encounter for screening mammogram for malignant neoplasm of breast: Secondary | ICD-10-CM

## 2013-08-22 ENCOUNTER — Ambulatory Visit
Admission: RE | Admit: 2013-08-22 | Discharge: 2013-08-22 | Disposition: A | Discharge status: Home / Self Care | Payer: BC Managed Care – PPO | Source: Ambulatory Visit

## 2013-08-22 DIAGNOSIS — Z1231 Encounter for screening mammogram for malignant neoplasm of breast: Secondary | ICD-10-CM

## 2013-08-28 ENCOUNTER — Ambulatory Visit: Payer: BC Managed Care – PPO

## 2013-11-08 ENCOUNTER — Other Ambulatory Visit (HOSPITAL_COMMUNITY): Payer: Self-pay | Admitting: Family Medicine

## 2013-11-08 DIAGNOSIS — K3184 Gastroparesis: Secondary | ICD-10-CM

## 2013-11-08 DIAGNOSIS — R1310 Dysphagia, unspecified: Secondary | ICD-10-CM

## 2013-11-08 DIAGNOSIS — R12 Heartburn: Secondary | ICD-10-CM

## 2013-11-08 DIAGNOSIS — R110 Nausea: Secondary | ICD-10-CM

## 2013-11-12 ENCOUNTER — Ambulatory Visit: Payer: BC Managed Care – PPO | Admitting: Cardiovascular Disease

## 2013-11-19 ENCOUNTER — Other Ambulatory Visit: Payer: Self-pay

## 2013-11-19 ENCOUNTER — Other Ambulatory Visit: Payer: Self-pay | Admitting: Cardiovascular Disease

## 2013-11-19 MED ORDER — METOPROLOL TARTRATE 50 MG PO TABS: ORAL_TABLET | ORAL | Status: DC

## 2013-12-09 ENCOUNTER — Other Ambulatory Visit: Payer: Self-pay | Admitting: Occupational Medicine

## 2013-12-09 ENCOUNTER — Ambulatory Visit: Payer: Self-pay

## 2013-12-09 DIAGNOSIS — R52 Pain, unspecified: Secondary | ICD-10-CM

## 2013-12-10 ENCOUNTER — Encounter (INDEPENDENT_AMBULATORY_CARE_PROVIDER_SITE_OTHER): Payer: Self-pay

## 2013-12-10 ENCOUNTER — Encounter: Payer: Self-pay | Admitting: Cardiovascular Disease

## 2013-12-10 ENCOUNTER — Ambulatory Visit (INDEPENDENT_AMBULATORY_CARE_PROVIDER_SITE_OTHER): Payer: BC Managed Care – PPO | Admitting: Cardiovascular Disease

## 2013-12-10 ENCOUNTER — Encounter: Payer: Self-pay | Admitting: *Deleted

## 2013-12-10 VITALS — BP 150/70 | HR 72 | Ht 64.0 in | Wt 183.8 lb

## 2013-12-10 DIAGNOSIS — I739 Peripheral vascular disease, unspecified: Secondary | ICD-10-CM

## 2013-12-10 DIAGNOSIS — I6529 Occlusion and stenosis of unspecified carotid artery: Secondary | ICD-10-CM

## 2013-12-10 LAB — CBC WITH DIFFERENTIAL/PLATELET
Basophils Absolute: 0 10*3/uL (ref 0.0–0.1)
Basophils Relative: 0.4 % (ref 0.0–3.0)
Eosinophils Absolute: 0.1 10*3/uL (ref 0.0–0.7)
Eosinophils Relative: 1.7 % (ref 0.0–5.0)
HCT: 36.7 % (ref 36.0–46.0)
Hemoglobin: 12.1 g/dL (ref 12.0–15.0)
Lymphocytes Relative: 19.1 % (ref 12.0–46.0)
Lymphs Abs: 1.3 10*3/uL (ref 0.7–4.0)
MCHC: 33 g/dL (ref 30.0–36.0)
MCV: 90.8 fl (ref 78.0–100.0)
Monocytes Absolute: 0.5 10*3/uL (ref 0.1–1.0)
Monocytes Relative: 7.4 % (ref 3.0–12.0)
Neutro Abs: 4.9 10*3/uL (ref 1.4–7.7)
Neutrophils Relative %: 71.4 % (ref 43.0–77.0)
Platelets: 256 10*3/uL (ref 150.0–400.0)
RBC: 4.04 Mil/uL (ref 3.87–5.11)
RDW: 15 % (ref 11.5–15.5)
WBC: 6.9 10*3/uL (ref 4.0–10.5)

## 2013-12-10 LAB — BASIC METABOLIC PANEL
BUN: 40 mg/dL — ABNORMAL HIGH (ref 6–23)
CO2: 30 mEq/L (ref 19–32)
Calcium: 9 mg/dL (ref 8.4–10.5)
Chloride: 97 mEq/L (ref 96–112)
Creatinine, Ser: 2.3 mg/dL — ABNORMAL HIGH (ref 0.4–1.2)
GFR: 23.3 mL/min — ABNORMAL LOW (ref >60.00)
Glucose, Bld: 337 mg/dL — ABNORMAL HIGH (ref 70–99)
Potassium: 5.1 mEq/L (ref 3.5–5.1)
Sodium: 135 mEq/L (ref 135–145)

## 2013-12-10 LAB — PROTIME-INR
INR: 0.9 ratio (ref 0.8–1.0)
Prothrombin Time: 10.1 s (ref 9.6–13.1)

## 2013-12-10 LAB — APTT: aPTT: 27.8 s (ref 21.7–28.8)

## 2013-12-10 NOTE — Patient Instructions (Signed)
Your physician recommends that you continue on your current medications as directed. Please refer to the Current Medication list given to you today.  Your physician has requested that you have a carotid duplex. DX: PAD. This test is an ultrasound of the carotid arteries in your neck. It looks at blood flow through these arteries that supply the brain with blood. Allow one hour for this exam. There are no restrictions or special instructions.  Your physician has requested that you have a peripheral vascular angiogram. This exam is performed at the hospital. During this exam IV contrast is used to look at arterial blood flow. Please review the information sheet given for details.  Your physician recommends that you have lab work in today: bmet/cbc/ptt/pt/inr

## 2013-12-10 NOTE — Assessment & Plan Note (Signed)
She is due for carotid Doppler. Most recent one showed 60-79% left ICA stenosis. She does have bilateral carotid bruits.

## 2013-12-10 NOTE — Progress Notes (Signed)
HPI  [name] is a pleasant 58 year old female who is here today for a followup visit regarding peripheral arterial disease. She has known history of  coronary artery status post coronary bypass graft surgery in 2004. Carotid Dopplers in 11 of 2014 showed 40-59% right ICA and 60-79% left ICA  stenosis. She also has prolonged history of diabetes with advanced chronic kidney disease (creatinine around 3)and possible diabetic neuropathy. She is a school bus driver. She has been treated for severe bilateral calf claudication worse on the left side after walking less than 50 feet. Due to her advanced chronic kidney disease, I decided to proceed with the initial medical therapy and a walking exercise program. Initially, she improved with Pletal. However, she now reports worsening symptoms. She is not able to do her work. The claudication is happening at very short distance. She is tearful today.    No Known Allergies   Current Outpatient Prescriptions on File Prior to Visit  Medication Sig Dispense Refill  . ACCU-CHEK AVIVA PLUS test strip       . albuterol (PROVENTIL HFA;VENTOLIN HFA) 108 (90 BASE) MCG/ACT inhaler Inhale 2 puffs into the lungs every 6 (six) hours as needed.        . ALPRAZolam (XANAX) 1 MG tablet 1 tablet as needed.      Marland Kitchen amLODipine (NORVASC) 5 MG tablet       . aspirin 81 MG tablet Take 81 mg by mouth daily.        Marland Kitchen atorvastatin (LIPITOR) 80 MG tablet Take 80 mg by mouth daily.        . cilostazol (PLETAL) 100 MG tablet TAKE 1 TABLET BY MOUTH TWICE A DAY  60 tablet  6  . fenofibrate micronized (LOFIBRA) 200 MG capsule Take 1 capsule (200 mg total) by mouth daily before breakfast.  30 capsule  2  . ferrous sulfate 325 (65 FE) MG EC tablet Take 325 mg by mouth daily with breakfast.       . furosemide (LASIX) 40 MG tablet 1 tablet 2 (two) times daily.      Marland Kitchen HUMALOG 100 UNIT/ML injection every morning.      . insulin glargine (LANTUS) 100 UNIT/ML injection Inject 55 Units into  the skin 2 (two) times daily. Take ac breakfast and evening meal      . levothyroxine (SYNTHROID) 88 MCG tablet Take 88 mcg by mouth daily.      . metoprolol (LOPRESSOR) 50 MG tablet TAKE 1 TABLET (50 MG TOTAL) BY MOUTH 2 (TWO) TIMES DAILY.  60 tablet  6  . nystatin-triamcinolone (MYCOLOG II) cream Apply 1 application topically as needed.       Marland Kitchen omeprazole (PRILOSEC) 20 MG capsule Take 40 mg by mouth 2 (two) times daily.       . ondansetron (ZOFRAN) 4 MG tablet       . oxyCODONE-acetaminophen (PERCOCET) 7.5-325 MG per tablet Take 1 tablet by mouth 4 (four) times daily.       . OXYCONTIN 10 MG T12A 12 hr tablet       . sertraline (ZOLOFT) 100 MG tablet Take 100 mg by mouth 2 (two) times daily.        No current facility-administered medications on file prior to visit.     Past Medical History  Diagnosis Date  . Hypertension   . GERD (gastroesophageal reflux disease)   . Chronic renal insufficiency   . Diabetes mellitus     type II  .  Diverticulosis of colon (without mention of hemorrhage)   . Depression with anxiety   . PVD (peripheral vascular disease)   . Hyperlipidemia   . Coronary artery disease   . Carpal tunnel syndrome   . Asthma   . Thyroid disease     hypo  . Adenomatous polyp of colon   . Depression   . Anemia   . Obesity      Past Surgical History  Procedure Laterality Date  . Cholecystectomy    . Coronary artery bypass graft      x4  . Appendectomy    . Knee surgery    . Abdominal hysterectomy       Family History  Problem Relation Age of Onset  . Diabetes Mother   . Heart disease Father   . Colon cancer Neg Hx   . Esophageal cancer Neg Hx   . Stomach cancer Neg Hx   . Colon polyps Daughter      History   Social History  . Marital Status: Widowed    Spouse Name: N/A    Number of Children: 2  . Years of Education: N/A   Occupational History  . [place] History Main Topics  . Smoking status: Former  Research scientist (life sciences)  . Smokeless tobacco: Never Used  . Alcohol Use: No  . Drug Use: No  . Sexual Activity: Not on file   Other Topics Concern  . Not on file   Social History Narrative   Daily Caffeine      PHYSICAL EXAM   BP 150/70  Pulse 72  Ht 5\' 4"  (1.626 m)  Wt 183 lb 12.8 oz (83.371 kg)  BMI 31.53 kg/m2 Constitutional: She is oriented to person, place, and time. She appears well-developed and well-nourished. No distress.  HENT: No nasal discharge.  Head: Normocephalic and atraumatic.  Eyes: Pupils are equal and round. Right eye exhibits no discharge. Left eye exhibits no discharge.  Neck: Normal range of motion. Neck supple. No JVD present. No thyromegaly present. Bilateral carotid bruits.  Cardiovascular: Normal rate, regular rhythm, normal heart sounds. Exam reveals no gallop and no friction rub. No murmur heard.  Pulmonary/Chest: Effort normal and breath sounds normal. No stridor. No respiratory distress. She has no wheezes. She has no rales. She exhibits no tenderness.  Abdominal: Soft. Bowel sounds are normal. She exhibits no distension. There is no tenderness. There is no rebound and no guarding.  Musculoskeletal: Normal range of motion. She exhibits no edema and no tenderness.  Neurological: She is alert and oriented to person, place, and time. Coordination normal.  Skin: Skin is warm and dry. No rash noted. She is not diaphoretic. No erythema. No pallor.  Psychiatric: She has a normal mood and affect. Her behavior is normal. Judgment and thought content normal.  Vascular:  Distal pulses are not palpable. Radial pulses are diminished bilaterally. Femoral pulse: +1 on right and barely palpable on left.    ASSESSMENT AND PLAN

## 2013-12-10 NOTE — Assessment & Plan Note (Signed)
She is having worsening severe claudication worse on the left side. She probably has multilevel disease with iliac as well as SFA disease. We have been avoiding angiography due to advanced chronic kidney disease. However, clearly her symptoms are worsening with inability to perform her work in spite of maximal dose Pletal. I discussed with her different management options. Given the severity of her symptoms, the best option is probably proceeding with angiography utilizing CO2 and avoiding contrast. She does understand that with this approach we might not be able to visualize the infrainguinal arteries very well. However, if she does have significant iliac disease, revascularization in this area might improve the inflow enough to relieve her disabling symptoms. She has been having difficult time performing her work which is understandable given the severity of her PAD. If revascularization is not possible, she should apply for disability. I will keep her off work until after the procedure.

## 2013-12-11 ENCOUNTER — Other Ambulatory Visit (HOSPITAL_COMMUNITY): Payer: Self-pay | Admitting: Cardiology

## 2013-12-11 DIAGNOSIS — I6529 Occlusion and stenosis of unspecified carotid artery: Secondary | ICD-10-CM

## 2013-12-13 ENCOUNTER — Ambulatory Visit (HOSPITAL_COMMUNITY): Payer: BC Managed Care – PPO | Attending: Cardiology | Admitting: Cardiology

## 2013-12-13 DIAGNOSIS — I6529 Occlusion and stenosis of unspecified carotid artery: Secondary | ICD-10-CM

## 2013-12-13 NOTE — Progress Notes (Signed)
Carotid duplex performed 

## 2013-12-16 ENCOUNTER — Encounter (HOSPITAL_COMMUNITY): Payer: Self-pay | Admitting: Pharmacy Technician

## 2013-12-18 ENCOUNTER — Ambulatory Visit (HOSPITAL_COMMUNITY)
Admission: RE | Admit: 2013-12-18 | Discharge: 2013-12-18 | Disposition: A | Discharge status: Home / Self Care | Payer: BC Managed Care – PPO | Source: Ambulatory Visit | Attending: Cardiovascular Disease | Admitting: Cardiovascular Disease

## 2013-12-18 ENCOUNTER — Encounter (HOSPITAL_COMMUNITY)
Admission: RE | Disposition: A | Discharge status: Home / Self Care | Payer: Self-pay | Source: Ambulatory Visit | Attending: Cardiovascular Disease

## 2013-12-18 DIAGNOSIS — J45909 Unspecified asthma, uncomplicated: Secondary | ICD-10-CM | POA: Insufficient documentation

## 2013-12-18 DIAGNOSIS — E669 Obesity, unspecified: Secondary | ICD-10-CM | POA: Insufficient documentation

## 2013-12-18 DIAGNOSIS — I70219 Atherosclerosis of native arteries of extremities with intermittent claudication, unspecified extremity: Secondary | ICD-10-CM

## 2013-12-18 DIAGNOSIS — Z794 Long term (current) use of insulin: Secondary | ICD-10-CM | POA: Insufficient documentation

## 2013-12-18 DIAGNOSIS — F329 Major depressive disorder, single episode, unspecified: Secondary | ICD-10-CM | POA: Insufficient documentation

## 2013-12-18 DIAGNOSIS — Z951 Presence of aortocoronary bypass graft: Secondary | ICD-10-CM | POA: Insufficient documentation

## 2013-12-18 DIAGNOSIS — I2510 Atherosclerotic heart disease of native coronary artery without angina pectoris: Secondary | ICD-10-CM | POA: Insufficient documentation

## 2013-12-18 DIAGNOSIS — Z6831 Body mass index (BMI) 31.0-31.9, adult: Secondary | ICD-10-CM | POA: Insufficient documentation

## 2013-12-18 DIAGNOSIS — F411 Generalized anxiety disorder: Secondary | ICD-10-CM | POA: Insufficient documentation

## 2013-12-18 DIAGNOSIS — D649 Anemia, unspecified: Secondary | ICD-10-CM | POA: Insufficient documentation

## 2013-12-18 DIAGNOSIS — N189 Chronic kidney disease, unspecified: Secondary | ICD-10-CM | POA: Insufficient documentation

## 2013-12-18 DIAGNOSIS — K219 Gastro-esophageal reflux disease without esophagitis: Secondary | ICD-10-CM | POA: Insufficient documentation

## 2013-12-18 DIAGNOSIS — Z87891 Personal history of nicotine dependence: Secondary | ICD-10-CM | POA: Insufficient documentation

## 2013-12-18 DIAGNOSIS — E785 Hyperlipidemia, unspecified: Secondary | ICD-10-CM | POA: Insufficient documentation

## 2013-12-18 DIAGNOSIS — I129 Hypertensive chronic kidney disease with stage 1 through stage 4 chronic kidney disease, or unspecified chronic kidney disease: Secondary | ICD-10-CM | POA: Insufficient documentation

## 2013-12-18 DIAGNOSIS — Z7982 Long term (current) use of aspirin: Secondary | ICD-10-CM | POA: Insufficient documentation

## 2013-12-18 DIAGNOSIS — E119 Type 2 diabetes mellitus without complications: Secondary | ICD-10-CM | POA: Insufficient documentation

## 2013-12-18 DIAGNOSIS — F3289 Other specified depressive episodes: Secondary | ICD-10-CM | POA: Insufficient documentation

## 2013-12-18 DIAGNOSIS — I739 Peripheral vascular disease, unspecified: Secondary | ICD-10-CM

## 2013-12-18 HISTORY — PX: ABDOMINAL AORTAGRAM: SHX5454

## 2013-12-18 LAB — GLUCOSE, CAPILLARY
GLUCOSE-CAPILLARY: 272 mg/dL — AB (ref 70–99)
Glucose-Capillary: 85 mg/dL (ref 70–99)

## 2013-12-18 SURGERY — ABDOMINAL AORTAGRAM
Anesthesia: LOCAL

## 2013-12-18 MED ORDER — LIDOCAINE HCL (PF) 1 % IJ SOLN
INTRAMUSCULAR | Status: AC
  Filled 2013-12-18: qty 30

## 2013-12-18 MED ORDER — HEPARIN (PORCINE) IN NACL 2-0.9 UNIT/ML-% IJ SOLN
INTRAMUSCULAR | Status: AC
  Filled 2013-12-18: qty 1000

## 2013-12-18 MED ORDER — SODIUM CHLORIDE 0.9 % IJ SOLN: 3.0000 mL | Freq: Two times a day (BID) | INTRAMUSCULAR | Status: DC

## 2013-12-18 MED ORDER — SODIUM CHLORIDE 0.9 % IJ SOLN: 3.0000 mL | INTRAMUSCULAR | Status: DC | PRN

## 2013-12-18 MED ORDER — ASPIRIN 81 MG PO CHEW
81.0000 mg | CHEWABLE_TABLET | ORAL | Status: AC
  Administered 2013-12-18: 81 mg via ORAL
  Filled 2013-12-18: qty 1

## 2013-12-18 MED ORDER — SODIUM CHLORIDE 0.9 % IV SOLN: 250.0000 mL | INTRAVENOUS | Status: DC | PRN

## 2013-12-18 MED ORDER — SODIUM CHLORIDE 0.9 % IV SOLN: INTRAVENOUS | Status: AC

## 2013-12-18 MED ORDER — FENTANYL CITRATE 0.05 MG/ML IJ SOLN
INTRAMUSCULAR | Status: AC
  Filled 2013-12-18: qty 2

## 2013-12-18 MED ORDER — SODIUM CHLORIDE 0.9 % IV SOLN
INTRAVENOUS | Status: DC
  Administered 2013-12-18: 07:00:00 via INTRAVENOUS

## 2013-12-18 MED ORDER — MIDAZOLAM HCL 2 MG/2ML IJ SOLN
INTRAMUSCULAR | Status: AC
  Filled 2013-12-18: qty 2

## 2013-12-18 NOTE — H&P (View-Only) (Signed)
HPI  [name] is a pleasant 58 year old female who is here today for a followup visit regarding peripheral arterial disease. She has known history of  coronary artery status post coronary bypass graft surgery in 2004. Carotid Dopplers in 11 of 2014 showed 40-59% right ICA and 60-79% left ICA  stenosis. She also has prolonged history of diabetes with advanced chronic kidney disease (creatinine around 3)and possible diabetic neuropathy. She is a school bus driver. She has been treated for severe bilateral calf claudication worse on the left side after walking less than 50 feet. Due to her advanced chronic kidney disease, I decided to proceed with the initial medical therapy and a walking exercise program. Initially, she improved with Pletal. However, she now reports worsening symptoms. She is not able to do her work. The claudication is happening at very short distance. She is tearful today.    No Known Allergies   Current Outpatient Prescriptions on File Prior to Visit  Medication Sig Dispense Refill  . ACCU-CHEK AVIVA PLUS test strip       . albuterol (PROVENTIL HFA;VENTOLIN HFA) 108 (90 BASE) MCG/ACT inhaler Inhale 2 puffs into the lungs every 6 (six) hours as needed.        . ALPRAZolam (XANAX) 1 MG tablet 1 tablet as needed.      Marland Kitchen amLODipine (NORVASC) 5 MG tablet       . aspirin 81 MG tablet Take 81 mg by mouth daily.        Marland Kitchen atorvastatin (LIPITOR) 80 MG tablet Take 80 mg by mouth daily.        . cilostazol (PLETAL) 100 MG tablet TAKE 1 TABLET BY MOUTH TWICE A DAY  60 tablet  6  . fenofibrate micronized (LOFIBRA) 200 MG capsule Take 1 capsule (200 mg total) by mouth daily before breakfast.  30 capsule  2  . ferrous sulfate 325 (65 FE) MG EC tablet Take 325 mg by mouth daily with breakfast.       . furosemide (LASIX) 40 MG tablet 1 tablet 2 (two) times daily.      Marland Kitchen HUMALOG 100 UNIT/ML injection every morning.      . insulin glargine (LANTUS) 100 UNIT/ML injection Inject 55 Units into  the skin 2 (two) times daily. Take ac breakfast and evening meal      . levothyroxine (SYNTHROID) 88 MCG tablet Take 88 mcg by mouth daily.      . metoprolol (LOPRESSOR) 50 MG tablet TAKE 1 TABLET (50 MG TOTAL) BY MOUTH 2 (TWO) TIMES DAILY.  60 tablet  6  . nystatin-triamcinolone (MYCOLOG II) cream Apply 1 application topically as needed.       Marland Kitchen omeprazole (PRILOSEC) 20 MG capsule Take 40 mg by mouth 2 (two) times daily.       . ondansetron (ZOFRAN) 4 MG tablet       . oxyCODONE-acetaminophen (PERCOCET) 7.5-325 MG per tablet Take 1 tablet by mouth 4 (four) times daily.       . OXYCONTIN 10 MG T12A 12 hr tablet       . sertraline (ZOLOFT) 100 MG tablet Take 100 mg by mouth 2 (two) times daily.        No current facility-administered medications on file prior to visit.     Past Medical History  Diagnosis Date  . Hypertension   . GERD (gastroesophageal reflux disease)   . Chronic renal insufficiency   . Diabetes mellitus     type II  .  Diverticulosis of colon (without mention of hemorrhage)   . Depression with anxiety   . PVD (peripheral vascular disease)   . Hyperlipidemia   . Coronary artery disease   . Carpal tunnel syndrome   . Asthma   . Thyroid disease     hypo  . Adenomatous polyp of colon   . Depression   . Anemia   . Obesity      Past Surgical History  Procedure Laterality Date  . Cholecystectomy    . Coronary artery bypass graft      x4  . Appendectomy    . Knee surgery    . Abdominal hysterectomy       Family History  Problem Relation Age of Onset  . Diabetes Mother   . Heart disease Father   . Colon cancer Neg Hx   . Esophageal cancer Neg Hx   . Stomach cancer Neg Hx   . Colon polyps Daughter      History   Social History  . Marital Status: Widowed    Spouse Name: N/A    Number of Children: 2  . Years of Education: N/A   Occupational History  . [employer] History Main Topics  . Smoking status: Former  Research scientist (life sciences)  . Smokeless tobacco: Never Used  . Alcohol Use: No  . Drug Use: No  . Sexual Activity: Not on file   Other Topics Concern  . Not on file   Social History Narrative   Daily Caffeine      PHYSICAL EXAM   BP 150/70  Pulse 72  Ht 5\' 4"  (1.626 m)  Wt 183 lb 12.8 oz (83.371 kg)  BMI 31.53 kg/m2 Constitutional: She is oriented to person, place, and time. She appears well-developed and well-nourished. No distress.  HENT: No nasal discharge.  Head: Normocephalic and atraumatic.  Eyes: Pupils are equal and round. Right eye exhibits no discharge. Left eye exhibits no discharge.  Neck: Normal range of motion. Neck supple. No JVD present. No thyromegaly present. Bilateral carotid bruits.  Cardiovascular: Normal rate, regular rhythm, normal heart sounds. Exam reveals no gallop and no friction rub. No murmur heard.  Pulmonary/Chest: Effort normal and breath sounds normal. No stridor. No respiratory distress. She has no wheezes. She has no rales. She exhibits no tenderness.  Abdominal: Soft. Bowel sounds are normal. She exhibits no distension. There is no tenderness. There is no rebound and no guarding.  Musculoskeletal: Normal range of motion. She exhibits no edema and no tenderness.  Neurological: She is alert and oriented to person, place, and time. Coordination normal.  Skin: Skin is warm and dry. No rash noted. She is not diaphoretic. No erythema. No pallor.  Psychiatric: She has a normal mood and affect. Her behavior is normal. Judgment and thought content normal.  Vascular:  Distal pulses are not palpable. Radial pulses are diminished bilaterally. Femoral pulse: +1 on right and barely palpable on left.    ASSESSMENT AND PLAN

## 2013-12-18 NOTE — CV Procedure (Signed)
PERIPHERAL VASCULAR PROCEDURE  NAME:  [name]   MRN: [id number] DOB:  1956-01-20   ADMIT DATE: 12/18/2013  Performing Cardiologist: [name] Primary Physician: Florina Ou, MD  Procedures Performed:  Abdominal Aortic Angiogram with Bi-Iliofemoral Runoff using CO2 angiography  Selective Left lower extremity  arterial angiography  Selective right lower extremity arterial angiography    Indication(s):   Claudication The patient has advanced chronic kidney disease and thus contrast use was minimized.   Consent: The procedure with Risks/Benefits/Alternatives and Indications was reviewed with the patient .  All questions were answered.  Medications:  Sedation:  1 mg IV Versed, 75 mcg IV Fentanyl  Contrast:  15 ML  Visipaque   Procedural details: The right groin was prepped, draped, and anesthetized with 1% lidocaine. Using modified Seldinger technique, a 4 French micropuncture sheath was introduced into the right common femoral artery. This was exchanged into a 5 [place] sheath. A 5 Fr Short Pigtail Catheter was advanced of over a  Versicore wire into the descending Aorta to a level just above the renal arteries. Manuel injection of CO2 was performed for Abdominal Aortic Angiography.  The catheter was then pulled back to a level just above the Aortic bifurcation, and a second manual injection was performed to evaluate the iliac arteries.    The pigtail catheter was changed over the Versicore wire for A crossover catheter which was then pulled back the aortic bifurcation and the wire was advanced down the contralateral common iliac artery.  The wire was then advanced to the contralateral common femoral artery, the catheter was exchanged into an end hole straight tip catheter which was advanced over the wire to the common femoral artery. Contralateral second-order lower extremity angiography was performed via Audelia Hives injection with CO2. Visualization was suboptimal. Thus, I  used 15 mL of contrast to image the left lower extremity arterial system The catheter was then removed. At this time the injector was directed to the sheath SideArm and ipsilateral artery angiography was performed via [facility] injection of CO2.  The patient tolerated the procedure well with no immediate complications.    Hemodynamics:  Central Aortic Pressure / Mean Aortic Pressure: 167/63  Findings:  Abdominal aorta: Normal in size with no evidence of aneurysm or significant atherosclerosis  Left renal artery: Normal  Right renal artery: Normal  Celiac artery: Patent  Superior mesenteric artery: Patent  Right common iliac artery: Normal in size with minor irregularities  Right internal iliac artery: Normal in size with possible ostial disease.  Right external iliac artery: 50% proximal disease  Right common femoral artery: Minor irregularities.  Right profunda femoral artery: Diffuse atherosclerosis.  Right superficial femoral artery: Calcified with diffuse 80% disease throughout its course  Right popliteal artery: Minor irregularities.  Right tibial peroneal trunk: Minor irregularities with three-vessel runoff below the knee. Below the knee arteries are not well visualized  Left common iliac artery:  Normal  Left internal iliac artery: Normal  Left external iliac artery: Normal  Left common femoral artery: Calcified with a high bifurcation and minor irregularities.  Left profunda femoral artery: Calcified proximally with 60-70% disease from the ostium to the proximal segment.  Left superficial femoral artery:  Occluded at the ostium. The vessel is heavily calcified and reconstitutes distally in the proximal popliteal artery via collaterals from the profunda  Left popliteal artery: Occluded proximally  Left tibial peroneal trunk: Minor irregularities  The tibial vessels are not well visualized due to poor flow but there seems  to be three-vessel  runoff   Conclusions: 1. No significant aortoiliac disease. 2. 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 However, the profunda has significant ostial and proximal disease which likely impair collateral flow. 3. Diffuse right SFA disease.  Recommendations:  No good options for endovascular intervention. The arteries are overall heavily calcified with long occlusion of the left SFA. Options include continued medical therapy versus left femoropopliteal bypass with endarterectomy on the left profunda.   [name], MD, [facility] 12/18/2013 1:33 PM

## 2013-12-18 NOTE — Interval H&P Note (Signed)
History and Physical Interval Note:  12/18/2013 12:32 PM  [name]  has presented today for surgery, with the diagnosis of pad  The various methods of treatment have been discussed with the patient and family. After consideration of risks, benefits and other options for treatment, the patient has consented to  Procedure(s): ABDOMINAL AORTAGRAM (N/A) as a surgical intervention .  The patient's history has been reviewed, patient examined, no change in status, stable for surgery.  I have reviewed the patient's chart and labs.  Questions were answered to the patient's satisfaction.     [name]

## 2013-12-18 NOTE — Discharge Instructions (Signed)
Angiography, Care After °Refer to this sheet in the next few weeks. These instructions provide you with information on caring for yourself after your procedure. Your health care provider may also give you more specific instructions. Your treatment has been planned according to current medical practices, but problems sometimes occur. Call your health care provider if you have any problems or questions after your procedure.  °WHAT TO EXPECT AFTER THE PROCEDURE °After your procedure, it is typical to have the following sensations: °· Minor discomfort or tenderness and a small bump at the catheter insertion site. The bump should usually decrease in size and tenderness within 1 to 2 weeks. °· Any bruising will usually fade within 2 to 4 weeks. °HOME CARE INSTRUCTIONS  °· You may need to keep taking blood thinners if they were prescribed for you. Only take over-the-counter or prescription medicines for pain, fever, or discomfort as directed by your health care provider. °· Do not apply powder or lotion to the site. °· Do not sit in a bathtub, swimming pool, or whirlpool for 5 to 7 days. °· You may shower 24 hours after the procedure. Remove the bandage (dressing) and gently wash the site with plain soap and water. Gently pat the site dry. °· Inspect the site at least twice daily. °· Limit your activity for the first 48 hours. Do not bend, squat, or lift anything over 20 lb (9 kg) or as directed by your health care provider. °· Do not drive home if you are discharged the day of the procedure. Have someone else drive you. Follow instructions about when you can drive or return to work. °SEEK MEDICAL CARE IF: °· You get lightheaded when standing up. °· You have drainage (other than a small amount of blood on the dressing). °· You have chills. °· You have a fever. °· You have redness, warmth, swelling, or pain at the insertion site. °SEEK IMMEDIATE MEDICAL CARE IF:  °· You develop chest pain or shortness of breath, feel faint,  or pass out. °· You have bleeding, swelling larger than a walnut, or drainage from the catheter insertion site. °· You develop pain, discoloration, coldness, or severe bruising in the leg or arm that held the catheter. °· You develop bleeding from any other place, such as the bowels. You may see bright red blood in your urine or stools, or your stools may appear black and tarry. °· You have heavy bleeding from the site. If this happens, hold pressure on the site. °MAKE SURE YOU: °· Understand these instructions. °· Will watch your condition. °· Will get help right away if you are not doing well or get worse. °Document Released: 01/13/2005 Document Revised: 02/27/2013 Document Reviewed: 11/19/2012 °[employer]® Patient Information ©2014 [employer]. ° °

## 2013-12-31 ENCOUNTER — Ambulatory Visit (INDEPENDENT_AMBULATORY_CARE_PROVIDER_SITE_OTHER): Payer: BC Managed Care – PPO | Admitting: Cardiovascular Disease

## 2013-12-31 ENCOUNTER — Encounter: Payer: Self-pay | Admitting: Cardiovascular Disease

## 2013-12-31 VITALS — BP 138/54 | HR 68 | Ht 64.0 in | Wt 182.8 lb

## 2013-12-31 DIAGNOSIS — I739 Peripheral vascular disease, unspecified: Secondary | ICD-10-CM

## 2013-12-31 DIAGNOSIS — I2510 Atherosclerotic heart disease of native coronary artery without angina pectoris: Secondary | ICD-10-CM

## 2013-12-31 DIAGNOSIS — N259 Disorder resulting from impaired renal tubular function, unspecified: Secondary | ICD-10-CM

## 2013-12-31 DIAGNOSIS — I6529 Occlusion and stenosis of unspecified carotid artery: Secondary | ICD-10-CM

## 2013-12-31 NOTE — Assessment & Plan Note (Signed)
Angiography was done mostly with CO2. Only 15 mL of contrast was used with pre and post hydration. She reports no change in urine output.

## 2013-12-31 NOTE — Progress Notes (Signed)
HPI  [name] is a pleasant 58 year old female who is here today for a followup visit regarding peripheral arterial disease. She has known history of  coronary artery status post coronary bypass graft surgery in 2004. Carotid Dopplers in 11 of 2014 showed 40-59% right ICA and 60-79% left ICA  stenosis. She also has prolonged history of diabetes with advanced chronic kidney disease (creatinine around 3)and possible diabetic neuropathy. She is a school bus driver. She has been treated for severe bilateral calf claudication worse on the left side after walking less than 50 feet. Due to her advanced chronic kidney disease, I decided to proceed with the initial medical therapy and a walking exercise program. Initially, she improved with Pletal. However, symptoms worsened recently. Thus, I proceeded with CO2 angiography which showed : 1. No significant aortoiliac disease.  2. 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 However, the profunda has significant ostial and proximal disease which likely impair collateral flow.  3. Diffuse 80% right SFA disease.     No Known Allergies   Current Outpatient Prescriptions on File Prior to Visit  Medication Sig Dispense Refill  . albuterol (PROVENTIL HFA;VENTOLIN HFA) 108 (90 BASE) MCG/ACT inhaler Inhale 2 puffs into the lungs every 6 (six) hours as needed for wheezing or shortness of breath.       . ALPRAZolam (XANAX) 1 MG tablet Take 1 tablet by mouth 4 (four) times daily.       Marland Kitchen amLODipine (NORVASC) 5 MG tablet Take 7.5 mg by mouth at bedtime.       Marland Kitchen aspirin EC 81 MG tablet Take 81 mg by mouth at bedtime.      Marland Kitchen atorvastatin (LIPITOR) 80 MG tablet Take 80 mg by mouth daily.        . cilostazol (PLETAL) 100 MG tablet Take 100 mg by mouth 2 (two) times daily.      . fenofibrate micronized (LOFIBRA) 200 MG capsule Take 200 mg by mouth daily before breakfast.      . ferrous  sulfate 325 (65 FE) MG EC tablet Take 325 mg by mouth 3 (three) times daily with meals.       . furosemide (LASIX) 40 MG tablet Take 40 mg by mouth 2 (two) times daily.       . insulin glargine (LANTUS) 100 UNIT/ML injection Inject 30-55 Units into the skin 2 (two) times daily as needed (per sliding scale based on blood sugar).       Marland Kitchen levothyroxine (SYNTHROID) 88 MCG tablet Take 88 mcg by mouth daily.      . metoprolol (LOPRESSOR) 50 MG tablet Take 50 mg by mouth 2 (two) times daily.      . Omega-3 Fatty Acids (FISH OIL) 1000 MG CAPS Take 1,000 mg by mouth 2 (two) times daily.      Marland Kitchen omeprazole (PRILOSEC) 40 MG capsule Take 40 mg by mouth daily.      . ondansetron (ZOFRAN) 4 MG tablet Take 4 mg by mouth every 8 (eight) hours as needed for nausea or vomiting.       Marland Kitchen oxyCODONE-acetaminophen (PERCOCET) 7.5-325 MG per tablet Take 1 tablet by mouth every 6 (six) hours as needed for pain.       . OXYCONTIN 10 MG T12A 12 hr tablet Take 10 mg by mouth every 12 (twelve) hours.       . sertraline (ZOLOFT) 100 MG tablet Take  100 mg by mouth 2 (two) times daily.        No current facility-administered medications on file prior to visit.     Past Medical History  Diagnosis Date  . Hypertension   . GERD (gastroesophageal reflux disease)   . Chronic renal insufficiency   . Diabetes mellitus     type II  . Diverticulosis of colon (without mention of hemorrhage)   . Depression with anxiety   . PVD (peripheral vascular disease)   . Hyperlipidemia   . Coronary artery disease   . Carpal tunnel syndrome   . Asthma   . Thyroid disease     hypo  . Adenomatous polyp of colon   . Depression   . Anemia   . Obesity      Past Surgical History  Procedure Laterality Date  . Cholecystectomy    . Coronary artery bypass graft      x4  . Appendectomy    . Knee surgery    . Abdominal hysterectomy       Family History  Problem Relation Age of Onset  . Diabetes Mother   . Heart disease Father   .  Colon cancer Neg Hx   . Esophageal cancer Neg Hx   . Stomach cancer Neg Hx   . Colon polyps Daughter      History   Social History  . Marital Status: Widowed    Spouse Name: N/A    Number of Children: 2  . Years of Education: N/A   Occupational History  . [place] History Main Topics  . Smoking status: Former Research scientist (life sciences)  . Smokeless tobacco: Never Used  . Alcohol Use: No  . Drug Use: No  . Sexual Activity: Not on file   Other Topics Concern  . Not on file   Social History Narrative   Daily Caffeine      PHYSICAL EXAM   There were no vitals taken for this visit. Constitutional: She is oriented to person, place, and time. She appears well-developed and well-nourished. No distress.  HENT: No nasal discharge.  Head: Normocephalic and atraumatic.  Eyes: Pupils are equal and round. Right eye exhibits no discharge. Left eye exhibits no discharge.  Neck: Normal range of motion. Neck supple. No JVD present. No thyromegaly present. Bilateral carotid bruits.  Cardiovascular: Normal rate, regular rhythm, normal heart sounds. Exam reveals no gallop and no friction rub. No murmur heard.  Pulmonary/Chest: Effort normal and breath sounds normal. No stridor. No respiratory distress. She has no wheezes. She has no rales. She exhibits no tenderness.  Abdominal: Soft. Bowel sounds are normal. She exhibits no distension. There is no tenderness. There is no rebound and no guarding.  Musculoskeletal: Normal range of motion. She exhibits no edema and no tenderness.  Neurological: She is alert and oriented to person, place, and time. Coordination normal.  Skin: Skin is warm and dry. No rash noted. She is not diaphoretic. No erythema. No pallor.  Psychiatric: She has a normal mood and affect. Her behavior is normal. Judgment and thought content normal.  Vascular:  Distal pulses are not palpable. Radial pulses are diminished bilaterally. Femoral pulse: +1 on  right and barely palpable on left.  No hematoma.   ASSESSMENT AND PLAN

## 2013-12-31 NOTE — Assessment & Plan Note (Signed)
She has no symptoms of angina. 

## 2013-12-31 NOTE — Patient Instructions (Signed)
Your physician wants you to follow-up in:  6 months. You will receive a reminder letter in the mail two months in advance. If you don't receive a letter, please call our office to schedule the follow-up appointment.   

## 2013-12-31 NOTE — Assessment & Plan Note (Signed)
Carotid Doppler this month showed 60-79% left ICA stenosis and 40-59% right ICA stenosis.   recommend a followup Doppler in 6 months.

## 2013-12-31 NOTE — Assessment & Plan Note (Addendum)
No good options for endovascular intervention especially with advanced CKD. The arteries are overall heavily calcified with long occlusion of the left SFA and diffusely diseased R SFA. Options include continued medical therapy versus left femoropopliteal bypass with endarterectomy on the left profunda. She is clearly disabled from this given her inability to walk more than 50 feet without significant discomfort. She takes pain medications on a regular basis. I signed her disability papers. Although surgical revascularization is an option, that would be associated with significant risk as well as decreased long-term patency. I suggested reserving this for critical limb ischemia. Continue medical therapy for now.

## 2014-01-06 ENCOUNTER — Other Ambulatory Visit (HOSPITAL_COMMUNITY): Payer: BC Managed Care – PPO

## 2014-02-26 ENCOUNTER — Telehealth: Payer: Self-pay | Admitting: Cardiovascular Disease

## 2014-02-26 NOTE — Telephone Encounter (Signed)
Walk in pt Form " Sealed Envelope" Dropped Off Lauren Back on Thursday Will give to Her then  8.19.15/km

## 2014-03-12 ENCOUNTER — Telehealth: Payer: Self-pay | Admitting: Cardiovascular Disease

## 2014-03-12 NOTE — Telephone Encounter (Addendum)
On 9.1.15, Dr. Fletcher Anon missed signing [name] second form: [name] left both forms with [name], asking her if I would call [phone] ( cath lab ) on 9.2.15 and take it over for signature.  This process was completed. A message was left for [name] to call the HIM dept. [name] called back and will pick up her forms on 9.3.15:djc  9.3.15, [name] arrived to pick up her paperwork:djc

## 2014-03-27 ENCOUNTER — Telehealth: Payer: Self-pay | Admitting: Cardiology

## 2014-03-27 NOTE — Telephone Encounter (Signed)
Pt wants to know if she can drop off her DMV form?

## 2014-03-27 NOTE — Telephone Encounter (Signed)
Pt. Called and informed that ,yes she could drop the forms off

## 2014-04-28 ENCOUNTER — Ambulatory Visit: Payer: Self-pay | Admitting: Cardiology

## 2014-06-18 ENCOUNTER — Other Ambulatory Visit: Payer: Self-pay | Admitting: *Deleted

## 2014-06-18 MED ORDER — METOPROLOL TARTRATE 50 MG PO TABS: 50.0000 mg | ORAL_TABLET | Freq: Two times a day (BID) | ORAL | Status: DC

## 2014-06-19 ENCOUNTER — Encounter (HOSPITAL_COMMUNITY): Payer: Self-pay | Admitting: Cardiovascular Disease

## 2014-06-24 ENCOUNTER — Encounter: Payer: Self-pay | Admitting: *Deleted

## 2014-06-24 ENCOUNTER — Ambulatory Visit (INDEPENDENT_AMBULATORY_CARE_PROVIDER_SITE_OTHER): Payer: BC Managed Care – PPO | Admitting: Cardiovascular Disease

## 2014-06-24 ENCOUNTER — Encounter: Payer: Self-pay | Admitting: Cardiovascular Disease

## 2014-06-24 VITALS — BP 126/54 | HR 66 | Ht 64.0 in | Wt 177.0 lb

## 2014-06-24 DIAGNOSIS — I6529 Occlusion and stenosis of unspecified carotid artery: Secondary | ICD-10-CM

## 2014-06-24 DIAGNOSIS — I2510 Atherosclerotic heart disease of native coronary artery without angina pectoris: Secondary | ICD-10-CM

## 2014-06-24 DIAGNOSIS — I739 Peripheral vascular disease, unspecified: Secondary | ICD-10-CM

## 2014-06-24 DIAGNOSIS — I10 Essential (primary) hypertension: Secondary | ICD-10-CM

## 2014-06-24 NOTE — Progress Notes (Signed)
HPI  [name] is a pleasant 58 year old female who is here today for a followup visit regarding peripheral arterial disease. She has known history of  coronary artery status post coronary bypass graft surgery in 2004. Carotid Dopplers in 2014 showed 40-59% right ICA and 60-79% left ICA  stenosis. She also has prolonged history of diabetes with advanced chronic kidney disease (creatinine around 3)and possible diabetic neuropathy. She is a school bus driver. She has been treated for severe bilateral calf claudication worse on the left side after walking less than 50 feet. Due to her advanced chronic kidney disease, I decided to proceed with the initial medical therapy and a walking exercise program. Initially, she improved with Pletal. However, symptoms worsened recently. Thus, I proceeded with CO2 angiography which showed : 1. No significant aortoiliac disease.  2. 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 However, the profunda has significant ostial and proximal disease which likely impair collateral flow.  3. Diffuse 80% right SFA disease. She was treated medically and filed for disability. She is doing better overall although still with severe claudication. She report he left side to be worse than the right. She has a lot of neuropathic pain as well. She is not smoking and diabetes control has been much better.     No Known Allergies   Current Outpatient Prescriptions on File Prior to Visit  Medication Sig Dispense Refill  . albuterol (PROVENTIL HFA;VENTOLIN HFA) 108 (90 BASE) MCG/ACT inhaler Inhale 2 puffs into the lungs every 6 (six) hours as needed for wheezing or shortness of breath.     . ALPRAZolam (XANAX) 1 MG tablet Take 1 tablet by mouth 4 (four) times daily.     Marland Kitchen aspirin EC 81 MG tablet Take 81 mg by mouth at bedtime.    Marland Kitchen atorvastatin (LIPITOR) 80 MG tablet Take 80 mg by mouth daily.      .  cilostazol (PLETAL) 100 MG tablet Take 100 mg by mouth 2 (two) times daily.    . fenofibrate micronized (LOFIBRA) 200 MG capsule Take 200 mg by mouth daily before breakfast.    . ferrous sulfate 325 (65 FE) MG EC tablet Take 325 mg by mouth 3 (three) times daily with meals.     . furosemide (LASIX) 40 MG tablet Take 40 mg by mouth 2 (two) times daily.     . Insulin Aspart (NOVOLOG [place]) Inject into the skin as needed (Pt takes as needed.).    Marland Kitchen insulin glargine (LANTUS) 100 UNIT/ML injection Inject 30-55 Units into the skin 2 (two) times daily as needed (per sliding scale based on blood sugar).     . metoprolol (LOPRESSOR) 50 MG tablet Take 1 tablet (50 mg total) by mouth 2 (two) times daily. 60 tablet 0  . Omega-3 Fatty Acids (FISH OIL) 1000 MG CAPS Take 1,000 mg by mouth 2 (two) times daily.    Marland Kitchen omeprazole (PRILOSEC) 40 MG capsule Take 40 mg by mouth daily.    . ondansetron (ZOFRAN) 4 MG tablet Take 4 mg by mouth every 8 (eight) hours as needed for nausea or vomiting.     Marland Kitchen oxyCODONE-acetaminophen (PERCOCET) 7.5-325 MG per tablet Take 1 tablet by mouth every 6 (six) hours as needed for pain.     . OXYCONTIN 10 MG T12A 12 hr tablet Take 10 mg by mouth every 12 (twelve) hours.     . sertraline (ZOLOFT) 100  MG tablet Take 100 mg by mouth 2 (two) times daily.      No current facility-administered medications on file prior to visit.     Past Medical History  Diagnosis Date  . Hypertension   . GERD (gastroesophageal reflux disease)   . Chronic renal insufficiency   . Diabetes mellitus     type II  . Diverticulosis of colon (without mention of hemorrhage)   . Depression with anxiety   . PVD (peripheral vascular disease)   . Hyperlipidemia   . Coronary artery disease   . Carpal tunnel syndrome   . Asthma   . Thyroid disease     hypo  . Adenomatous polyp of colon   . Depression   . Anemia   . Obesity      Past Surgical History  Procedure Laterality Date  . Cholecystectomy    .  Coronary artery bypass graft      x4  . Appendectomy    . Knee surgery    . Abdominal hysterectomy    . Abdominal aortagram N/A 12/18/2013    Procedure: ABDOMINAL [name];  Surgeon: Wellington Hampshire, MD;  Location: [facility] CATH LAB;  Service: Cardiovascular;  Laterality: N/A;     Family History  Problem Relation Age of Onset  . Diabetes Mother   . Heart disease Father   . Colon cancer Neg Hx   . Esophageal cancer Neg Hx   . Stomach cancer Neg Hx   . Colon polyps Daughter      History   Social History  . Marital Status: Widowed    Spouse Name: N/A    Number of Children: 2  . Years of Education: N/A   Occupational History  . [place] History Main Topics  . Smoking status: Former Research scientist (life sciences)  . Smokeless tobacco: Never Used  . Alcohol Use: No  . Drug Use: No  . Sexual Activity: Not on file   Other Topics Concern  . Not on file   Social History Narrative   Daily Caffeine      PHYSICAL EXAM   BP 126/54 mmHg  Pulse 66  Ht 5\' 4"  (1.626 m)  Wt 177 lb (80.287 kg)  BMI 30.37 kg/m2 Constitutional: She is oriented to person, place, and time. She appears well-developed and well-nourished. No distress.  HENT: No nasal discharge.  Head: Normocephalic and atraumatic.  Eyes: Pupils are equal and round. Right eye exhibits no discharge. Left eye exhibits no discharge.  Neck: Normal range of motion. Neck supple. No JVD present. No thyromegaly present. Bilateral carotid bruits.  Cardiovascular: Normal rate, regular rhythm, normal heart sounds. Exam reveals no gallop and no friction rub. No murmur heard.  Pulmonary/Chest: Effort normal and breath sounds normal. No stridor. No respiratory distress. She has no wheezes. She has no rales. She exhibits no tenderness.  Abdominal: Soft. Bowel sounds are normal. She exhibits no distension. There is no tenderness. There is no rebound and no guarding.  Musculoskeletal: Normal range of motion. She exhibits no  edema and no tenderness.  Neurological: She is alert and oriented to person, place, and time. Coordination normal.  Skin: Skin is warm and dry. No rash noted. She is not diaphoretic. No erythema. No pallor.  Psychiatric: She has a normal mood and affect. Her behavior is normal. Judgment and thought content normal.  Vascular:  Distal pulses are not palpable. Radial pulses are diminished bilaterally. Femoral pulse: +1 on right and barely palpable on  left.  No hematoma.   ASSESSMENT AND PLAN

## 2014-06-24 NOTE — Patient Instructions (Signed)
Your physician has requested that you have a carotid duplex. This test is an ultrasound of the carotid arteries in your neck. It looks at blood flow through these arteries that supply the brain with blood. Allow one hour for this exam. There are no restrictions or special instructions.  Your physician recommends that you continue on your current medications as directed. Please refer to the Current Medication list given to you today.  Your physician wants you to follow-up in: 6 months with Dr. Fletcher Anon. You will receive a reminder letter in the mail two months in advance. If you don't receive a letter, please call our office to schedule the follow-up appointment.

## 2014-06-25 ENCOUNTER — Encounter: Payer: Self-pay | Admitting: Cardiovascular Disease

## 2014-06-25 NOTE — Assessment & Plan Note (Signed)
Carotid Doppler this month showed 60-79% left ICA stenosis and 40-59% right ICA stenosis.   She is due for a carotid Doppler.

## 2014-06-25 NOTE — Assessment & Plan Note (Signed)
No good options for endovascular intervention especially with advanced CKD. The arteries are overall heavily calcified with long occlusion of the left SFA and diffusely diseased R SFA. Options include continued medical therapy versus left femoropopliteal bypass with endarterectomy on the left profunda. She seems actually to be doing better than before with medical therapy. I made no changes today. Surgical revascularization can be considered as a last resort.

## 2014-06-25 NOTE — Assessment & Plan Note (Signed)
Blood pressure is controlled on current medications. 

## 2014-06-25 NOTE — Assessment & Plan Note (Signed)
She reports no symptoms of angina. Continue medical therapy

## 2014-06-26 ENCOUNTER — Ambulatory Visit (HOSPITAL_COMMUNITY): Payer: BC Managed Care – PPO | Attending: Cardiology | Admitting: Cardiology

## 2014-06-26 DIAGNOSIS — R0989 Other specified symptoms and signs involving the circulatory and respiratory systems: Secondary | ICD-10-CM | POA: Insufficient documentation

## 2014-06-26 DIAGNOSIS — I10 Essential (primary) hypertension: Secondary | ICD-10-CM | POA: Diagnosis not present

## 2014-06-26 DIAGNOSIS — I739 Peripheral vascular disease, unspecified: Secondary | ICD-10-CM | POA: Diagnosis not present

## 2014-06-26 DIAGNOSIS — Z951 Presence of aortocoronary bypass graft: Secondary | ICD-10-CM | POA: Insufficient documentation

## 2014-06-26 DIAGNOSIS — I2510 Atherosclerotic heart disease of native coronary artery without angina pectoris: Secondary | ICD-10-CM | POA: Diagnosis not present

## 2014-06-26 DIAGNOSIS — I6529 Occlusion and stenosis of unspecified carotid artery: Secondary | ICD-10-CM

## 2014-06-26 DIAGNOSIS — Z87891 Personal history of nicotine dependence: Secondary | ICD-10-CM | POA: Insufficient documentation

## 2014-06-26 DIAGNOSIS — E119 Type 2 diabetes mellitus without complications: Secondary | ICD-10-CM | POA: Insufficient documentation

## 2014-06-26 DIAGNOSIS — E785 Hyperlipidemia, unspecified: Secondary | ICD-10-CM | POA: Insufficient documentation

## 2014-06-26 DIAGNOSIS — I6523 Occlusion and stenosis of bilateral carotid arteries: Secondary | ICD-10-CM | POA: Insufficient documentation

## 2014-06-26 NOTE — Progress Notes (Signed)
Carotid duplex performed 

## 2014-07-01 ENCOUNTER — Telehealth: Payer: Self-pay | Admitting: Cardiology

## 2014-07-01 NOTE — Telephone Encounter (Signed)
Spoke with pt pharmacy, refill authorization given.

## 2014-07-01 NOTE — Telephone Encounter (Signed)
Nene is calling to verify a prescription for the pt

## 2014-07-17 NOTE — Progress Notes (Signed)
HPI: FU CAD; history of coronary artery status post coronary bypass and graft in 2004. Her last Myoview was performed in July of 2012. At that time, her ejection fraction was 75%; perfusion normal. Patient is followed by Dr. Fletcher Anon for PVD. Had arteriogram 6/15 which showed -no significant aortoiliac disease. 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 However, the profunda has significant ostial and proximal disease which likely impair collateral flow. Diffuse 80% right SFA disease. Patient has been treated medically. Carotid Dopplers in Dec 2015 showed 40-59% right and 60-79 % left stenosis. Followup was recommended in six months. Since I last saw her she denies chest pain. Mild dyspnea on exertion. She continues to have claudication.  Current Outpatient Prescriptions  Medication Sig Dispense Refill  . albuterol (PROVENTIL HFA;VENTOLIN HFA) 108 (90 BASE) MCG/ACT inhaler Inhale 2 puffs into the lungs every 6 (six) hours as needed for wheezing or shortness of breath.     . ALPRAZolam (XANAX) 1 MG tablet Take 1 tablet by mouth 4 (four) times daily.     Marland Kitchen amLODipine (NORVASC) 10 MG tablet     . aspirin EC 81 MG tablet Take 81 mg by mouth at bedtime.    Marland Kitchen atorvastatin (LIPITOR) 80 MG tablet Take 80 mg by mouth daily.      . cilostazol (PLETAL) 100 MG tablet Take 100 mg by mouth 2 (two) times daily.    . fenofibrate micronized (LOFIBRA) 200 MG capsule Take 200 mg by mouth daily before breakfast.    . ferrous sulfate 325 (65 FE) MG EC tablet Take 325 mg by mouth 3 (three) times daily with meals.     . furosemide (LASIX) 40 MG tablet Take 40 mg by mouth 2 (two) times daily.     Marland Kitchen HUMALOG 100 UNIT/ML injection as needed.    . Insulin Aspart (NOVOLOG [place]) Inject into the skin as needed (Pt takes as needed.).    Marland Kitchen insulin glargine (LANTUS) 100 UNIT/ML injection Inject 30-55 Units into the skin 2 (two) times daily as needed  (per sliding scale based on blood sugar).     Marland Kitchen levothyroxine (SYNTHROID, LEVOTHROID) 100 MCG tablet     . metoprolol (LOPRESSOR) 50 MG tablet Take 1 tablet (50 mg total) by mouth 2 (two) times daily. 60 tablet 0  . Omega-3 Fatty Acids (FISH OIL) 1000 MG CAPS Take 1,000 mg by mouth 2 (two) times daily.    Marland Kitchen omeprazole (PRILOSEC) 40 MG capsule Take 40 mg by mouth daily.    . ondansetron (ZOFRAN) 4 MG tablet Take 4 mg by mouth every 8 (eight) hours as needed for nausea or vomiting.     Marland Kitchen oxyCODONE-acetaminophen (PERCOCET) 7.5-325 MG per tablet Take 1 tablet by mouth every 6 (six) hours as needed for pain.     . OXYCONTIN 10 MG T12A 12 hr tablet Take 10 mg by mouth every 12 (twelve) hours.     . sertraline (ZOLOFT) 100 MG tablet Take 100 mg by mouth 2 (two) times daily.      No current facility-administered medications for this visit.     Past Medical History  Diagnosis Date  . Hypertension   . GERD (gastroesophageal reflux disease)   . Chronic renal insufficiency   . Diabetes mellitus     type II  . Diverticulosis of colon (without mention of hemorrhage)   . Depression with anxiety   . PVD (  peripheral vascular disease)   . Hyperlipidemia   . Coronary artery disease   . Carpal tunnel syndrome   . Asthma   . Thyroid disease     hypo  . Adenomatous polyp of colon   . Depression   . Anemia   . Obesity     Past Surgical History  Procedure Laterality Date  . Cholecystectomy    . Coronary artery bypass graft      x4  . Appendectomy    . Knee surgery    . Abdominal hysterectomy    . Abdominal aortagram N/A 12/18/2013    Procedure: ABDOMINAL [name];  Surgeon: Wellington Hampshire, MD;  Location: [facility] CATH LAB;  Service: Cardiovascular;  Laterality: N/A;    History   Social History  . Marital Status: Widowed    Spouse Name: N/A    Number of Children: 2  . Years of Education: N/A   Occupational History  . [place] History Main Topics  .  Smoking status: Former Research scientist (life sciences)  . Smokeless tobacco: Never Used  . Alcohol Use: No  . Drug Use: No  . Sexual Activity: Not on file   Other Topics Concern  . Not on file   Social History Narrative   Daily Caffeine    ROS: no fevers or chills, productive cough, hemoptysis, dysphasia, odynophagia, melena, hematochezia, dysuria, hematuria, rash, seizure activity, orthopnea, PND, pedal edema, claudication. Remaining systems are negative.  Physical Exam: Well-developed well-nourished in no acute distress.  Skin is warm and dry.  HEENT is normal.  Neck is supple.  Chest is clear to auscultation with normal expansion.  Cardiovascular exam is regular rate and rhythm.  Abdominal exam nontender or distended. No masses palpated. Extremities show no edema. neuro grossly intact  ECG sinus rhythm at a rate of 74. RV conduction delay. Nonspecific ST changes.

## 2014-07-18 ENCOUNTER — Ambulatory Visit (INDEPENDENT_AMBULATORY_CARE_PROVIDER_SITE_OTHER): Payer: BC Managed Care – PPO | Admitting: Cardiology

## 2014-07-18 ENCOUNTER — Encounter: Payer: Self-pay | Admitting: Cardiology

## 2014-07-18 ENCOUNTER — Encounter: Payer: Self-pay | Admitting: *Deleted

## 2014-07-18 VITALS — BP 142/68 | HR 74 | Ht 64.0 in | Wt 175.7 lb

## 2014-07-18 DIAGNOSIS — I25810 Atherosclerosis of coronary artery bypass graft(s) without angina pectoris: Secondary | ICD-10-CM

## 2014-07-18 DIAGNOSIS — N259 Disorder resulting from impaired renal tubular function, unspecified: Secondary | ICD-10-CM

## 2014-07-18 DIAGNOSIS — I739 Peripheral vascular disease, unspecified: Secondary | ICD-10-CM

## 2014-07-18 DIAGNOSIS — I10 Essential (primary) hypertension: Secondary | ICD-10-CM

## 2014-07-18 NOTE — Assessment & Plan Note (Signed)
Continue aspirin and statin. Schedule nuclear study for risk stratification. 

## 2014-07-18 NOTE — Patient Instructions (Signed)
Your physician wants you to follow-up in: ONE YEAR WITH DR CRENSHAW You will receive a reminder letter in the mail two months in advance. If you don't receive a letter, please call our office to schedule the follow-up appointment.   Your physician has requested that you have a lexiscan myoview. For further information please visit [email or web address]. Please follow instruction sheet, as given.   

## 2014-07-18 NOTE — Assessment & Plan Note (Signed)
Continue statin. 

## 2014-07-18 NOTE — Assessment & Plan Note (Signed)
Continue aspirin and statin. Followed by Dr. Fletcher Anon

## 2014-07-18 NOTE — Assessment & Plan Note (Signed)
Followed by nephrology. 

## 2014-07-18 NOTE — Assessment & Plan Note (Signed)
Patient follows her blood pressure at home and it is difficult to control. Continue present medications.

## 2014-07-18 NOTE — Assessment & Plan Note (Signed)
Continue aspirin and statin. Follow-up carotid Dopplers June 2016. 

## 2014-08-07 ENCOUNTER — Telehealth (HOSPITAL_COMMUNITY): Payer: Self-pay

## 2014-08-07 NOTE — Telephone Encounter (Signed)
Encounter complete. 

## 2014-08-12 ENCOUNTER — Telehealth (HOSPITAL_COMMUNITY): Payer: Self-pay

## 2014-08-12 ENCOUNTER — Ambulatory Visit (HOSPITAL_COMMUNITY)
Admission: RE | Admit: 2014-08-12 | Discharge: 2014-08-12 | Disposition: A | Discharge status: Home / Self Care | Payer: BC Managed Care – PPO | Source: Ambulatory Visit | Attending: Cardiovascular Disease | Admitting: Cardiovascular Disease

## 2014-08-12 DIAGNOSIS — I2510 Atherosclerotic heart disease of native coronary artery without angina pectoris: Secondary | ICD-10-CM | POA: Diagnosis not present

## 2014-08-12 DIAGNOSIS — I25810 Atherosclerosis of coronary artery bypass graft(s) without angina pectoris: Secondary | ICD-10-CM | POA: Diagnosis not present

## 2014-08-12 DIAGNOSIS — R0609 Other forms of dyspnea: Secondary | ICD-10-CM | POA: Insufficient documentation

## 2014-08-12 MED ORDER — TECHNETIUM TC 99M SESTAMIBI GENERIC - CARDIOLITE
31.7000 | Freq: Once | INTRAVENOUS | Status: AC | PRN
  Administered 2014-08-12: 31.7 via INTRAVENOUS

## 2014-08-12 MED ORDER — TECHNETIUM TC 99M SESTAMIBI GENERIC - CARDIOLITE
10.1000 | Freq: Once | INTRAVENOUS | Status: AC | PRN
  Administered 2014-08-12: 10.1 via INTRAVENOUS

## 2014-08-12 MED ORDER — REGADENOSON 0.4 MG/5ML IV SOLN
0.4000 mg | Freq: Once | INTRAVENOUS | Status: AC
  Administered 2014-08-12: 0.4 mg via INTRAVENOUS

## 2014-08-12 NOTE — Procedures (Addendum)
 [name] [address] [place] [address] [id number]  Cardiology Nuclear Med Study  [name] is a 59 y.o. female     MRN : [id number]     DOB: [birth year]  Procedure Date: 08/12/2014  Nuclear Med Background Indication for Stress Test:  Follow up CAD History:  Asthma and %CAD;CABG X4-2004;STENT/PTCA;Last NUC MPI on 01/25/2011-normal;EF - Cardiac Risk Factors: Carotid Disease, Family History - CAD, History of Smoking, Hypertension, IDDM Type 2, Lipids, Overweight and PVD  Symptoms:  DOE, Fatigue and Nausea   Nuclear Pre-Procedure Caffeine/Decaff Intake:  9:00pm NPO After: 7:00am   IV Site: R Forearm  IV 0.9% NS with Angio Cath:  22g  Chest Size (in):  n/a IV Started by: Rolene Course, RN  Height: 5\' 4"  (1.626 m)  Cup Size: B  BMI:  Body mass index is 30.02 kg/(m^2). Weight:  175 lb (79.379 kg)   Tech Comments:  n/a    Nuclear Med Study 1 or 2 day study: 1 day  Stress Test Type:  [place] Provider:  Kirk Ruths, MD   Resting Radionuclide: Technetium 19m Sestamibi  Resting Radionuclide Dose: 10.1 mCi   Stress Radionuclide:  Technetium 80m Sestamibi  Stress Radionuclide Dose: 31.7 mCi           Stress Protocol Rest HR: 60 Stress HR: 78  Rest BP: 143/57 Stress BP:151/50  Exercise Time (min): n/a METS: n/a          Dose of Adenosine (mg):  n/a Dose of Lexiscan: 0.4 mg  Dose of Atropine (mg): n/a Dose of Dobutamine: n/a mcg/kg/min (at max HR)  Stress Test Technologist: Mellody Memos, CCT Nuclear Technologist: Imagene Riches, CNMT   Rest Procedure:  Myocardial perfusion imaging was performed at rest 45 minutes following the intravenous administration of Technetium 4m Sestamibi. Stress Procedure:  The patient received IV Lexiscan 0.4 mg over 15-seconds.  Technetium 50m Sestamibi injected IV at 30-seconds.  There were no significant changes with Lexiscan.  Quantitative spect images were  obtained after a 45 minute delay.  Transient Ischemic Dilatation (Normal <1.22):  1.33  QGS EDV:  80 ml QGS ESV:  30 ml LV Ejection Fraction: 63%       Rest ECG: NSR - Normal EKG  Stress ECG: No significant change from baseline ECG  QPS Raw Data Images:  Normal; no motion artifact; normal heart/lung ratio. Stress Images:  Normal homogeneous uptake in all areas of the myocardium. Rest Images:  Normal homogeneous uptake in all areas of the myocardium. Subtraction (SDS):  No evidence of ischemia.  Impression Exercise Capacity:  Lexiscan with no exercise. BP Response:  Normal blood pressure response. Clinical Symptoms:  No significant symptoms noted. ECG Impression:  No significant ST segment change suggestive of ischemia. Comparison with Prior Nuclear Study: No significant change from previous study  Overall Impression:  Normal stress nuclear study.  LV Wall Motion:  NL LV Function; NL Wall Motion   Lorretta Harp, MD  08/12/2014 4:53 PM

## 2014-08-13 NOTE — Telephone Encounter (Signed)
Encounter complete. 

## 2014-08-14 ENCOUNTER — Telehealth: Payer: Self-pay | Admitting: Cardiovascular Disease

## 2014-08-14 NOTE — Telephone Encounter (Signed)
New message      Need note stating pt is still out of work and her next appt is in June.  Please call pt and she will pick it up

## 2014-08-15 NOTE — Telephone Encounter (Signed)
I spoke with the pt and she needs an updated letter every couple of months in regards to being disabled. Letter completed and placed at the front desk for pick-up.

## 2014-09-14 ENCOUNTER — Other Ambulatory Visit: Payer: Self-pay | Admitting: Cardiovascular Disease

## 2014-10-23 ENCOUNTER — Other Ambulatory Visit: Payer: Self-pay | Admitting: Pain Medicine

## 2014-10-23 DIAGNOSIS — M545 Low back pain: Secondary | ICD-10-CM

## 2014-10-25 ENCOUNTER — Ambulatory Visit
Admission: RE | Admit: 2014-10-25 | Discharge: 2014-10-25 | Disposition: A | Discharge status: Home / Self Care | Payer: BC Managed Care – PPO | Source: Ambulatory Visit | Attending: Pain Medicine | Admitting: Pain Medicine

## 2014-10-25 DIAGNOSIS — M545 Low back pain: Secondary | ICD-10-CM

## 2014-12-18 ENCOUNTER — Telehealth: Payer: Self-pay | Admitting: *Deleted

## 2014-12-18 NOTE — Telephone Encounter (Signed)
need fm status, unable to reach pt.Marland KitchenMarland Kitchen

## 2014-12-23 ENCOUNTER — Encounter: Payer: Self-pay | Admitting: Cardiovascular Disease

## 2014-12-23 ENCOUNTER — Ambulatory Visit (INDEPENDENT_AMBULATORY_CARE_PROVIDER_SITE_OTHER): Payer: BC Managed Care – PPO | Admitting: Cardiovascular Disease

## 2014-12-23 VITALS — BP 118/60 | HR 71 | Ht 64.0 in | Wt 186.6 lb

## 2014-12-23 DIAGNOSIS — I6523 Occlusion and stenosis of bilateral carotid arteries: Secondary | ICD-10-CM | POA: Diagnosis not present

## 2014-12-23 DIAGNOSIS — E785 Hyperlipidemia, unspecified: Secondary | ICD-10-CM

## 2014-12-23 DIAGNOSIS — I10 Essential (primary) hypertension: Secondary | ICD-10-CM

## 2014-12-23 DIAGNOSIS — I739 Peripheral vascular disease, unspecified: Secondary | ICD-10-CM

## 2014-12-23 NOTE — Progress Notes (Signed)
HPI  [name] is a pleasant 59 year old female who is here today for a followup visit regarding peripheral arterial disease. She has known history of  coronary artery status post coronary bypass graft surgery in 2004. Carotid Dopplers in 2014 showed 40-59% right ICA and 60-79% left ICA  stenosis. She also has prolonged history of diabetes with advanced chronic kidney disease (creatinine around 3)and possible diabetic neuropathy. She is a school bus driver. She has been treated for severe bilateral calf claudication worse on the left side after walking less than 50 feet. Due to her advanced chronic kidney disease, I decided to proceed with the initial medical therapy and a walking exercise program. Initially, she improved with Pletal. However, symptoms worsened recently. Thus, I proceeded with CO2 angiography in June 2015 which showed : 1. No significant aortoiliac disease.  2. 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 However, the profunda has significant ostial and proximal disease which likely impair collateral flow.  3. Diffuse 80% right SFA disease.  There was no good options for endovascular intervention on the left side. She was treated medically. She is currently on disability and she seems to be less stressed. She has been walking regularly with slight improvement in claudication. She has known carotid stenosis with 40-59% right ICA stenosis and 60-79% left ICA stenosis.    No Known Allergies   Current Outpatient Prescriptions on File Prior to Visit  Medication Sig Dispense Refill  . albuterol (PROVENTIL HFA;VENTOLIN HFA) 108 (90 BASE) MCG/ACT inhaler Inhale 2 puffs into the lungs every 6 (six) hours as needed for wheezing or shortness of breath.     . ALPRAZolam (XANAX) 1 MG tablet Take 1 tablet by mouth 4 (four) times daily as needed for anxiety.     Marland Kitchen amLODipine (NORVASC) 10 MG tablet Take 10 mg by  mouth daily.     Marland Kitchen aspirin EC 81 MG tablet Take 81 mg by mouth at bedtime.    Marland Kitchen atorvastatin (LIPITOR) 80 MG tablet Take 80 mg by mouth daily.      . cilostazol (PLETAL) 100 MG tablet TAKE 1 TABLET BY MOUTH TWICE A DAY 60 tablet 6  . fenofibrate micronized (LOFIBRA) 200 MG capsule Take 200 mg by mouth daily before breakfast.    . furosemide (LASIX) 40 MG tablet Take 40 mg by mouth 2 (two) times daily.     Marland Kitchen HUMALOG 100 UNIT/ML injection as directed.     . Insulin Aspart (NOVOLOG [place]) Inject into the skin as directed.     . insulin glargine (LANTUS) 100 UNIT/ML injection Inject 30-55 Units into the skin 2 (two) times daily as needed (per sliding scale based on blood sugar).     . metoprolol (LOPRESSOR) 50 MG tablet Take 1 tablet (50 mg total) by mouth 2 (two) times daily. 60 tablet 0  . Omega-3 Fatty Acids (FISH OIL) 1000 MG CAPS Take 1,000 mg by mouth 2 (two) times daily.    Marland Kitchen omeprazole (PRILOSEC) 40 MG capsule Take 40 mg by mouth daily.    . ondansetron (ZOFRAN) 4 MG tablet Take 4 mg by mouth every 8 (eight) hours as needed for nausea or vomiting.     Marland Kitchen oxyCODONE-acetaminophen (PERCOCET) 7.5-325 MG per tablet Take 1 tablet by mouth 2 (two) times daily as needed (pain).     Marland Kitchen sertraline (ZOLOFT) 100 MG tablet Take 100 mg by mouth 2 (two) times daily.  No current facility-administered medications on file prior to visit.     Past Medical History  Diagnosis Date  . Hypertension   . GERD (gastroesophageal reflux disease)   . Chronic renal insufficiency   . Diabetes mellitus     type II  . Diverticulosis of colon (without mention of hemorrhage)   . Depression with anxiety   . PVD (peripheral vascular disease)   . Hyperlipidemia   . Coronary artery disease   . Carpal tunnel syndrome   . Asthma   . Thyroid disease     hypo  . Adenomatous polyp of colon   . Depression   . Anemia   . Obesity      Past Surgical History  Procedure Laterality Date  . Cholecystectomy    .  Coronary artery bypass graft      x4  . Appendectomy    . Knee surgery    . Abdominal hysterectomy    . Abdominal aortagram N/A 12/18/2013    Procedure: ABDOMINAL [name];  Surgeon: Wellington Hampshire, MD;  Location: [facility] CATH LAB;  Service: Cardiovascular;  Laterality: N/A;     Family History  Problem Relation Age of Onset  . Diabetes Mother   . Heart disease Father   . Colon cancer Neg Hx   . Esophageal cancer Neg Hx   . Stomach cancer Neg Hx   . Colon polyps Daughter      History   Social History  . Marital Status: Widowed    Spouse Name: N/A  . Number of Children: 2  . Years of Education: N/A   Occupational History  . [place] History Main Topics  . Smoking status: Former Research scientist (life sciences)  . Smokeless tobacco: Never Used  . Alcohol Use: No  . Drug Use: No  . Sexual Activity: Not on file   Other Topics Concern  . Not on file   Social History Narrative   Daily Caffeine      PHYSICAL EXAM   BP 118/60 mmHg  Pulse 71  Ht 5\' 4"  (1.626 m)  Wt 186 lb 9.6 oz (84.641 kg)  BMI 32.01 kg/m2 Constitutional: She is oriented to person, place, and time. She appears well-developed and well-nourished. No distress.  HENT: No nasal discharge.  Head: Normocephalic and atraumatic.  Eyes: Pupils are equal and round. Right eye exhibits no discharge. Left eye exhibits no discharge.  Neck: Normal range of motion. Neck supple. No JVD present. No thyromegaly present. Bilateral carotid bruits.  Cardiovascular: Normal rate, regular rhythm, normal heart sounds. Exam reveals no gallop and no friction rub. No murmur heard.  Pulmonary/Chest: Effort normal and breath sounds normal. No stridor. No respiratory distress. She has no wheezes. She has no rales. She exhibits no tenderness.  Abdominal: Soft. Bowel sounds are normal. She exhibits no distension. There is no tenderness. There is no rebound and no guarding.  Musculoskeletal: Normal range of motion. She  exhibits no edema and no tenderness.  Neurological: She is alert and oriented to person, place, and time. Coordination normal.  Skin: Skin is warm and dry. No rash noted. She is not diaphoretic. No erythema. No pallor.  Psychiatric: She has a normal mood and affect. Her behavior is normal. Judgment and thought content normal.  Vascular:  Distal pulses are not palpable. Radial pulses are diminished bilaterally. Femoral pulse: +1 on right and barely palpable on left.    ASSESSMENT AND PLAN

## 2014-12-23 NOTE — Assessment & Plan Note (Signed)
She is due for carotid Doppler which was ordered. Continue aggressive treatment of risk factors.

## 2014-12-23 NOTE — Assessment & Plan Note (Signed)
Blood pressure is well controlled on current medications. 

## 2014-12-23 NOTE — Patient Instructions (Signed)
Medication Instructions:  Your physician recommends that you continue on your current medications as directed. Please refer to the Current Medication list given to you today.   Labwork:   Testing/Procedures:  Your physician has requested that you have a carotid duplex. This test is an ultrasound of the carotid arteries in your neck. It looks at blood flow through these arteries that supply the brain with blood. Allow one hour for this exam. There are no restrictions or special instructions.   Follow-Up:  Your physician wants you to follow-up in:  [place] will receive a reminder letter in the mail two months in advance. If you don't receive a letter, please call our office to schedule the follow-up appointment.   Any Other Special Instructions Will Be Listed Below (If Applicable).

## 2014-12-23 NOTE — Assessment & Plan Note (Signed)
She is currently on atorvastatin and fenofibrate.

## 2014-12-23 NOTE — Assessment & Plan Note (Signed)
No good options for endovascular intervention especially with advanced CKD. The arteries are overall heavily calcified with long occlusion of the left SFA and diffusely diseased R SFA. Options include continued medical therapy versus left femoropopliteal bypass with endarterectomy on the left profunda. Claudication is currently not lifestyle limiting and thus I recommend continuing aggressive medical therapy.

## 2014-12-30 ENCOUNTER — Ambulatory Visit (HOSPITAL_COMMUNITY): Payer: BC Managed Care – PPO | Attending: Cardiology

## 2014-12-30 DIAGNOSIS — I6523 Occlusion and stenosis of bilateral carotid arteries: Secondary | ICD-10-CM | POA: Diagnosis present

## 2015-01-02 ENCOUNTER — Telehealth: Payer: Self-pay | Admitting: Cardiovascular Disease

## 2015-01-02 NOTE — Telephone Encounter (Signed)
New message      PT calling regarding test results. Please advise

## 2015-01-02 NOTE — Telephone Encounter (Signed)
Patient notified of Carotid Doppler study results - "Moderate bilateral carotid stenosis with mild worsening on the right. Repeat doppler in 6 months.", per Dr. Fletcher Anon.  Patient verbalized understanding and appreciation for phone call. Denies further questions or concerns. Edited notes under December "recall" visit for appt with Dr. Fletcher Anon to flag for repeat Carotid study.

## 2015-03-17 ENCOUNTER — Other Ambulatory Visit: Payer: Self-pay

## 2015-03-17 DIAGNOSIS — Z1231 Encounter for screening mammogram for malignant neoplasm of breast: Secondary | ICD-10-CM

## 2015-03-24 ENCOUNTER — Inpatient Hospital Stay: Admission: RE | Admit: 2015-03-24 | Payer: Self-pay | Source: Ambulatory Visit

## 2015-04-24 ENCOUNTER — Ambulatory Visit
Admission: RE | Admit: 2015-04-24 | Discharge: 2015-04-24 | Disposition: A | Discharge status: Home / Self Care | Payer: BC Managed Care – PPO | Source: Ambulatory Visit

## 2015-04-24 DIAGNOSIS — Z1231 Encounter for screening mammogram for malignant neoplasm of breast: Secondary | ICD-10-CM

## 2015-04-27 ENCOUNTER — Other Ambulatory Visit: Payer: Self-pay | Admitting: Cardiovascular Disease

## 2015-04-27 MED ORDER — CILOSTAZOL 100 MG PO TABS: 100.0000 mg | ORAL_TABLET | Freq: Two times a day (BID) | ORAL | Status: DC

## 2015-06-16 ENCOUNTER — Other Ambulatory Visit: Payer: Self-pay | Admitting: Cardiovascular Disease

## 2015-06-16 DIAGNOSIS — I6523 Occlusion and stenosis of bilateral carotid arteries: Secondary | ICD-10-CM

## 2015-06-23 ENCOUNTER — Ambulatory Visit (INDEPENDENT_AMBULATORY_CARE_PROVIDER_SITE_OTHER): Payer: BC Managed Care – PPO | Admitting: Cardiovascular Disease

## 2015-06-23 ENCOUNTER — Encounter: Payer: Self-pay | Admitting: Cardiovascular Disease

## 2015-06-23 VITALS — BP 153/71 | HR 82 | Ht 64.0 in | Wt 187.8 lb

## 2015-06-23 DIAGNOSIS — I10 Essential (primary) hypertension: Secondary | ICD-10-CM

## 2015-06-23 DIAGNOSIS — I739 Peripheral vascular disease, unspecified: Secondary | ICD-10-CM

## 2015-06-23 DIAGNOSIS — I6523 Occlusion and stenosis of bilateral carotid arteries: Secondary | ICD-10-CM

## 2015-06-23 DIAGNOSIS — I2510 Atherosclerotic heart disease of native coronary artery without angina pectoris: Secondary | ICD-10-CM

## 2015-06-23 NOTE — Assessment & Plan Note (Signed)
Blood pressure is elevated but she did not take her blood pressure medications today. Continue to monitor. I might consider switching metoprolol to carvedilol.

## 2015-06-23 NOTE — Patient Instructions (Signed)
Medication Instructions: No change.   Labwork: None.   Procedures/Testing: Keep follow up for carotid doppler  Follow-Up: 6 months with Dr. Fletcher Anon.   Any Additional Special Instructions Will Be Listed Below (If Applicable).

## 2015-06-23 NOTE — Assessment & Plan Note (Signed)
She has known severe bilateral calf claudication due to SFA disease. She is being treated medically and symptoms gradually improved. There is no evidence of critical limb ischemia and I recommend continued aggressive medical therapy.

## 2015-06-23 NOTE — Progress Notes (Signed)
HPI  [name] is a pleasant 59 year old female who is here today for a followup visit regarding peripheral arterial disease. She has known history of  coronary artery status post coronary bypass graft surgery in 2004. She is known to have moderate bilateral carotid stenosis.  She also has prolonged history of diabetes with advanced chronic kidney disease (creatinine around 3)and possible diabetic neuropathy. 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  She has severe bilateral calf claudication worse on the left side after walking less than 50 feet. Previous angiography in June 2015 showed : 1. No significant aortoiliac disease.  2. 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 However, the profunda has significant ostial and proximal disease which likely impair collateral flow.  3. Diffuse 80% right SFA disease.  She has been treated medically. Bilateral calf claudication is stable with no significant worsening. She is sometimes able to walk for 30 minutes if done slowly. There is no breast pain or lower extremity ulceration. No chest pain or shortness of breath.    No Known Allergies   Current Outpatient Prescriptions on File Prior to Visit  Medication Sig Dispense Refill  . albuterol (PROVENTIL HFA;VENTOLIN HFA) 108 (90 BASE) MCG/ACT inhaler Inhale 2 puffs into the lungs every 6 (six) hours as needed for wheezing or shortness of breath.     . ALPRAZolam (XANAX) 1 MG tablet Take 1 tablet by mouth 4 (four) times daily as needed for anxiety.     Marland Kitchen amLODipine (NORVASC) 10 MG tablet Take 10 mg by mouth daily.     Marland Kitchen aspirin EC 81 MG tablet Take 81 mg by mouth at bedtime.    Marland Kitchen atorvastatin (LIPITOR) 80 MG tablet Take 80 mg by mouth daily.      . cilostazol (PLETAL) 100 MG tablet Take 1 tablet (100 mg total) by mouth 2 (two) times daily. 60 tablet 6  . fenofibrate  micronized (LOFIBRA) 200 MG capsule Take 200 mg by mouth daily before breakfast.    . furosemide (LASIX) 40 MG tablet Take 40 mg by mouth 2 (two) times daily.     . Insulin Aspart (NOVOLOG [place]) Inject 30-55 Units into the skin 2 (two) times daily.     . insulin glargine (LANTUS) 100 UNIT/ML injection Inject 30-55 Units into the skin 2 (two) times daily as needed (per sliding scale based on blood sugar).     Marland Kitchen levothyroxine (SYNTHROID, LEVOTHROID) 150 MCG tablet Take 1 tablet by mouth daily before breakfast.  5  . metoprolol (LOPRESSOR) 50 MG tablet Take 1 tablet (50 mg total) by mouth 2 (two) times daily. 60 tablet 0  . Omega-3 Fatty Acids (FISH OIL) 1000 MG CAPS Take 1,000 mg by mouth 2 (two) times daily.    Marland Kitchen omeprazole (PRILOSEC) 40 MG capsule Take 40 mg by mouth daily.    . ondansetron (ZOFRAN) 4 MG tablet Take 4 mg by mouth every 8 (eight) hours as needed for nausea or vomiting.     Marland Kitchen oxyCODONE-acetaminophen (PERCOCET) 7.5-325 MG per tablet Take 1 tablet by mouth 2 (two) times daily as needed (pain).     . OXYCONTIN 30 MG T12A Take 20 mg by mouth every 12 (twelve) hours.   0  . sertraline (ZOLOFT) 100 MG tablet Take 100 mg by mouth 2 (two) times daily.     . Vitamin D, Ergocalciferol, (  DRISDOL) 50000 UNITS CAPS capsule Take 50,000 Units by mouth once a week.  1   No current facility-administered medications on file prior to visit.     Past Medical History  Diagnosis Date  . Hypertension   . GERD (gastroesophageal reflux disease)   . Chronic renal insufficiency   . Diabetes mellitus     type II  . Diverticulosis of colon (without mention of hemorrhage)   . Depression with anxiety   . PVD (peripheral vascular disease) ([place])   . Hyperlipidemia   . Coronary artery disease   . Carpal tunnel syndrome   . Asthma   . Thyroid disease     hypo  . Adenomatous polyp of colon   . Depression   . Anemia   . Obesity      Past Surgical History  Procedure Laterality Date  .  Cholecystectomy    . Coronary artery bypass graft      x4  . Appendectomy    . Knee surgery    . Abdominal hysterectomy    . Abdominal aortagram N/A 12/18/2013    Procedure: ABDOMINAL [name];  Surgeon: Wellington Hampshire, MD;  Location: [facility] CATH LAB;  Service: Cardiovascular;  Laterality: N/A;     Family History  Problem Relation Age of Onset  . Diabetes Mother   . Heart disease Father   . Colon cancer Neg Hx   . Esophageal cancer Neg Hx   . Stomach cancer Neg Hx   . Colon polyps Daughter      Social History   Social History  . Marital Status: Widowed    Spouse Name: N/A  . Number of Children: 2  . Years of Education: N/A   Occupational History  . [place] History Main Topics  . Smoking status: Former Research scientist (life sciences)  . Smokeless tobacco: Never Used  . Alcohol Use: No  . Drug Use: No  . Sexual Activity: Not on file   Other Topics Concern  . Not on file   Social History Narrative   Daily Caffeine      PHYSICAL EXAM   BP 153/71 mmHg  Pulse 82  Ht 5\' 4"  (1.626 m)  Wt 187 lb 12.8 oz (85.186 kg)  BMI 32.22 kg/m2 Constitutional: She is oriented to person, place, and time. She appears well-developed and well-nourished. No distress.  HENT: No nasal discharge.  Head: Normocephalic and atraumatic.  Eyes: Pupils are equal and round. Right eye exhibits no discharge. Left eye exhibits no discharge.  Neck: Normal range of motion. Neck supple. No JVD present. No thyromegaly present. Bilateral carotid bruits.  Cardiovascular: Normal rate, regular rhythm, normal heart sounds. Exam reveals no gallop and no friction rub. No murmur heard.  Pulmonary/Chest: Effort normal and breath sounds normal. No stridor. No respiratory distress. She has no wheezes. She has no rales. She exhibits no tenderness.  Abdominal: Soft. Bowel sounds are normal. She exhibits no distension. There is no tenderness. There is no rebound and no guarding.  Musculoskeletal:  Normal range of motion. She exhibits no edema and no tenderness.  Neurological: She is alert and oriented to person, place, and time. Coordination normal.  Skin: Skin is warm and dry. No rash noted. She is not diaphoretic. No erythema. No pallor.  Psychiatric: She has a normal mood and affect. Her behavior is normal. Judgment and thought content normal.  Vascular:  Distal pulses are not palpable. Radial pulses are diminished bilaterally. Femoral pulse: +1 on right  and barely palpable on left.   EKG: Normal sinus rhythm with possible left atrial enlargement and incomplete right bundle branch block.  ASSESSMENT AND PLAN

## 2015-06-23 NOTE — Assessment & Plan Note (Signed)
She denies anginal symptoms.

## 2015-06-23 NOTE — Assessment & Plan Note (Signed)
Most recent carotid Doppler showed 60-79% bilateral stenosis. She is going for a repeat carotid Doppler and if stable will likely repeat in one year.

## 2015-07-01 ENCOUNTER — Ambulatory Visit (HOSPITAL_COMMUNITY)
Admission: RE | Admit: 2015-07-01 | Discharge: 2015-07-01 | Disposition: A | Discharge status: Home / Self Care | Payer: BC Managed Care – PPO | Source: Ambulatory Visit | Attending: Cardiovascular Disease | Admitting: Cardiovascular Disease

## 2015-07-01 DIAGNOSIS — I6523 Occlusion and stenosis of bilateral carotid arteries: Secondary | ICD-10-CM | POA: Insufficient documentation

## 2015-07-08 ENCOUNTER — Other Ambulatory Visit: Payer: Self-pay

## 2015-07-08 DIAGNOSIS — E785 Hyperlipidemia, unspecified: Secondary | ICD-10-CM

## 2015-07-16 ENCOUNTER — Encounter: Payer: Self-pay | Admitting: Internal Medicine

## 2015-08-03 ENCOUNTER — Other Ambulatory Visit: Payer: Self-pay | Admitting: *Deleted

## 2015-08-03 MED ORDER — METOPROLOL TARTRATE 50 MG PO TABS: 50.0000 mg | ORAL_TABLET | Freq: Two times a day (BID) | ORAL | Status: DC

## 2015-08-04 ENCOUNTER — Other Ambulatory Visit: Payer: Self-pay

## 2015-08-04 NOTE — Telephone Encounter (Signed)
Approved      Disp Refills Start End    metoprolol (LOPRESSOR) 50 MG tablet 60 tablet 1 08/03/2015     Sig - Route:  Take 1 tablet (50 mg total) by mouth 2 (two) times daily. - Oral    Class:  Normal    DAW:  No    Authorizing Provider:  Lelon Perla, MD    Ordering User:  Rush Landmark, CMA      Visit Pharmacy     [facility] #[id number] - [place], [place] - [address]

## 2015-08-10 NOTE — Progress Notes (Signed)
HPI: FU CAD; history of coronary artery status post coronary bypass and graft in 2004. Patient is followed by Dr. Fletcher Anon for PVD. Had arteriogram 6/15 which showed -no significant aortoiliac disease. 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 However, the profunda has significant ostial and proximal disease which likely impair collateral flow. Diffuse 80% right SFA disease. Patient has been treated medically. Nuclear study February 2016 showed ejection fraction 63% and normal perfusion. Carotid Dopplers in Dec 2016 showed 40-59% right and 60-79 % left stenosis. Followup was recommended in 12 months. Since I last saw her the patient has dyspnea with more extreme activities but not with routine activities. It is relieved with rest. It is not associated with chest pain. There is no orthopnea, PND or pedal edema. There is no syncope or palpitations. There is no exertional chest pain.   Current Outpatient Prescriptions  Medication Sig Dispense Refill  . albuterol (PROVENTIL HFA;VENTOLIN HFA) 108 (90 BASE) MCG/ACT inhaler Inhale 2 puffs into the lungs every 6 (six) hours as needed for wheezing or shortness of breath.     . ALPRAZolam (XANAX) 1 MG tablet Take 1 tablet by mouth 4 (four) times daily as needed for anxiety.     Marland Kitchen amLODipine (NORVASC) 10 MG tablet Take 10 mg by mouth daily.     Marland Kitchen aspirin EC 81 MG tablet Take 81 mg by mouth at bedtime.    Marland Kitchen atorvastatin (LIPITOR) 80 MG tablet Take 80 mg by mouth daily.      . B-D INS SYR ULTRAFINE 1CC/31G 31G X 5/16" 1 ML MISC USE NEW NEEDLE FOR MEAL TIME 3 TIMES A DAY AND LONG ACTING INSULIN TWICE A DAY  11  . cilostazol (PLETAL) 100 MG tablet Take 1 tablet (100 mg total) by mouth 2 (two) times daily. 60 tablet 6  . fenofibrate micronized (LOFIBRA) 200 MG capsule Take 200 mg by mouth daily before breakfast.    . ferrous sulfate 325 (65 FE) MG tablet Take 325 mg by mouth 3  (three) times daily.  3  . furosemide (LASIX) 40 MG tablet Take 40 mg by mouth 2 (two) times daily.     . Insulin Aspart (NOVOLOG [place]) Inject 30-55 Units into the skin 2 (two) times daily.     . insulin glargine (LANTUS) 100 UNIT/ML injection Inject 30-55 Units into the skin 2 (two) times daily as needed (per sliding scale based on blood sugar).     Marland Kitchen levothyroxine (SYNTHROID, LEVOTHROID) 175 MCG tablet Take 1 tablet by mouth daily.    . metoprolol (LOPRESSOR) 50 MG tablet Take 1 tablet (50 mg total) by mouth 2 (two) times daily. 60 tablet 1  . Omega-3 Fatty Acids (FISH OIL) 1000 MG CAPS Take 1,000 mg by mouth 2 (two) times daily.    Marland Kitchen omeprazole (PRILOSEC) 40 MG capsule Take 40 mg by mouth daily.    . ondansetron (ZOFRAN) 4 MG tablet Take 4 mg by mouth every 8 (eight) hours as needed for nausea or vomiting.     Glory Rosebush VERIO test strip CHECK SUGAR AT HOME 4-5 TIMES A DAY  11  . oxyCODONE (OXYCONTIN) 20 mg 12 hr tablet Take 1 tablet by mouth 2 (two) times daily.    Marland Kitchen oxyCODONE-acetaminophen (PERCOCET) 7.5-325 MG per tablet Take 1 tablet by mouth 2 (two) times daily as needed (pain).     Marland Kitchen sertraline (ZOLOFT) 100 MG tablet  Take 100 mg by mouth 2 (two) times daily.     . Vitamin D, Ergocalciferol, (DRISDOL) 50000 UNITS CAPS capsule Take 50,000 Units by mouth once a week.  1   No current facility-administered medications for this visit.     Past Medical History  Diagnosis Date  . Hypertension   . GERD (gastroesophageal reflux disease)   . Chronic renal insufficiency   . Diabetes mellitus     type II  . Diverticulosis of colon (without mention of hemorrhage)   . Depression with anxiety   . PVD (peripheral vascular disease) ([place])   . Hyperlipidemia   . Coronary artery disease   . Carpal tunnel syndrome   . Asthma   . Thyroid disease     hypo  . Adenomatous polyp of colon   . Depression   . Anemia   . Obesity     Past Surgical History  Procedure Laterality Date  .  Cholecystectomy    . Coronary artery bypass graft      x4  . Appendectomy    . Knee surgery    . Abdominal hysterectomy    . Abdominal aortagram N/A 12/18/2013    Procedure: ABDOMINAL [name];  Surgeon: Wellington Hampshire, MD;  Location: [facility] CATH LAB;  Service: Cardiovascular;  Laterality: N/A;    Social History   Social History  . Marital Status: Widowed    Spouse Name: N/A  . Number of Children: 2  . Years of Education: N/A   Occupational History  . [place] History Main Topics  . Smoking status: Former Research scientist (life sciences)  . Smokeless tobacco: Never Used  . Alcohol Use: No  . Drug Use: No  . Sexual Activity: Not on file   Other Topics Concern  . Not on file   Social History Narrative   Daily Caffeine    Family History  Problem Relation Age of Onset  . Diabetes Mother   . Heart disease Father   . Colon cancer Neg Hx   . Esophageal cancer Neg Hx   . Stomach cancer Neg Hx   . Colon polyps Daughter     ROS: Claudication but no fevers or chills, productive cough, hemoptysis, dysphasia, odynophagia, melena, hematochezia, dysuria, hematuria, rash, seizure activity, orthopnea, PND, pedal edema. Remaining systems are negative.  Physical Exam: Well-developed well-nourished in no acute distress.  Skin is warm and dry.  HEENT is normal.  Neck is supple.  Chest is clear to auscultation with normal expansion.  Cardiovascular exam is regular rate and rhythm.  Abdominal exam nontender or distended. No masses palpated. Extremities show no edema. neuro grossly intact  ECG 06/23/2015-sinus rhythm, RV conduction delay, nonspecific ST changes, prolonged QT interval.

## 2015-08-13 ENCOUNTER — Encounter: Payer: Self-pay | Admitting: Cardiology

## 2015-08-13 ENCOUNTER — Ambulatory Visit (INDEPENDENT_AMBULATORY_CARE_PROVIDER_SITE_OTHER): Payer: BC Managed Care – PPO | Admitting: Cardiology

## 2015-08-13 VITALS — BP 110/50 | HR 60 | Ht 64.0 in | Wt 187.5 lb

## 2015-08-13 DIAGNOSIS — E785 Hyperlipidemia, unspecified: Secondary | ICD-10-CM | POA: Diagnosis not present

## 2015-08-13 DIAGNOSIS — I6523 Occlusion and stenosis of bilateral carotid arteries: Secondary | ICD-10-CM

## 2015-08-13 DIAGNOSIS — I10 Essential (primary) hypertension: Secondary | ICD-10-CM | POA: Diagnosis not present

## 2015-08-13 DIAGNOSIS — I2510 Atherosclerotic heart disease of native coronary artery without angina pectoris: Secondary | ICD-10-CM

## 2015-08-13 DIAGNOSIS — I739 Peripheral vascular disease, unspecified: Secondary | ICD-10-CM

## 2015-08-13 NOTE — Assessment & Plan Note (Signed)
Blood pressure controlled. Continue present medications. 

## 2015-08-13 NOTE — Assessment & Plan Note (Signed)
Continue aspirin and statin. 

## 2015-08-13 NOTE — Assessment & Plan Note (Signed)
Continue statin. Lipids and liver monitored by primary care. 

## 2015-08-13 NOTE — Assessment & Plan Note (Signed)
Continue aspirin and statin. Followed by Dr. Fletcher Anon.

## 2015-08-13 NOTE — Patient Instructions (Signed)
Your physician wants you to follow-up in: [place] will receive a reminder letter in the mail two months in advance. If you don't receive a letter, please call our office to schedule the follow-up appointment.   If you need a refill on your cardiac medications before your next appointment, please call your pharmacy.

## 2015-08-13 NOTE — Assessment & Plan Note (Signed)
Continue aspirin and statin.Follow-up carotid Dopplers December 2017.

## 2015-11-23 ENCOUNTER — Telehealth: Payer: Self-pay | Admitting: Cardiovascular Disease

## 2015-11-23 ENCOUNTER — Other Ambulatory Visit: Payer: Self-pay | Admitting: *Deleted

## 2015-11-23 MED ORDER — CILOSTAZOL 100 MG PO TABS: 100.0000 mg | ORAL_TABLET | Freq: Two times a day (BID) | ORAL | Status: DC

## 2015-11-23 NOTE — Telephone Encounter (Signed)
Ok to hold plavix 7 days prior to procedure and resume 2 days after [name]

## 2015-11-23 NOTE — Telephone Encounter (Signed)
New message     [place] from [facility] is calling concerned someone is trying tot send a fax through the phone line, she provided the correct fax number [phone]

## 2015-11-23 NOTE — Telephone Encounter (Signed)
Will fax this not to the number provided.

## 2015-11-23 NOTE — Telephone Encounter (Signed)
Spoke to [name] who said she had spoken to a person who told her to fax a form over to [phone] for clearance. Cannot find encounter in EPIC for this.  Pt is seen by Dr Stanford Breed. Will route to him to address clearance and Fredia Beets.

## 2015-11-23 NOTE — Telephone Encounter (Signed)
New message     [place] spoke with someone on Thursday  May 11 th to fax request over for the pt to be off blood thinner, (Plavix) for the procedure.  Request for surgical clearance:  What type of surgery is being performed? Lumbar DIccogram  When is this surgery scheduled? June 1st  Are there any medications that need to be held prior to surgery and how long? Plavix [place] asking how long to hold Plavix  Name of physician performing surgery? Dr. Vira Blanco   What is your office phone and fax number? [phone] not get a fax forgot

## 2015-11-23 NOTE — Telephone Encounter (Signed)
Pt clearance resent to correct fax number listed below.

## 2015-12-08 ENCOUNTER — Encounter: Payer: Self-pay | Admitting: Cardiovascular Disease

## 2015-12-08 ENCOUNTER — Ambulatory Visit (INDEPENDENT_AMBULATORY_CARE_PROVIDER_SITE_OTHER): Payer: BC Managed Care – PPO | Admitting: Cardiovascular Disease

## 2015-12-08 VITALS — BP 138/46 | HR 60 | Ht 64.0 in | Wt 185.0 lb

## 2015-12-08 DIAGNOSIS — I739 Peripheral vascular disease, unspecified: Secondary | ICD-10-CM | POA: Diagnosis not present

## 2015-12-08 NOTE — Progress Notes (Signed)
Cardiology Office Note   Date:  12/08/2015   ID:  BURDETTE BUETI, DOB May 06, 1956, MRN [id number]  PCP:  Nicholes Rough, PA-C  Cardiologist:   Dr. Stanford Breed  Chief Complaint  Patient presents with  . Follow-up  . Shortness of Breath      History of Present Illness: [name] is a 60 y.o. female who presents for a followup visit regarding peripheral arterial disease. She has known history of  coronary artery status post coronary bypass graft surgery in 2004. She is known to have moderate bilateral carotid stenosis.  She also has prolonged history of diabetes with advanced chronic kidney disease (creatinine around 3)and possible diabetic neuropathy. 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  She has severe bilateral calf claudication worse on the left side after walking less than 50 feet. Previous angiography in June 2015 showed : 1. No significant aortoiliac disease.  2. 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 However, the profunda has significant ostial and proximal disease which likely impair collateral flow.  3. Diffuse 80% right SFA disease.  She has been treated medically. Her her symptoms improved with cilostazol. She has been doing reasonably well with no chest pain. She has stable exertional dyspnea. She reports worsening claudication since cilostazol has been on hold for spinal injection. She has no rest pain or lower extremity ulceration.  Past Medical History  Diagnosis Date  . Hypertension   . GERD (gastroesophageal reflux disease)   . Chronic renal insufficiency   . Diabetes mellitus     type II  . Diverticulosis of colon (without mention of hemorrhage)   . Depression with anxiety   . PVD (peripheral vascular disease) ([place])   . Hyperlipidemia   . Coronary artery disease   . Carpal tunnel syndrome   . Asthma   . Thyroid disease    hypo  . Adenomatous polyp of colon   . Depression   . Anemia   . Obesity     Past Surgical History  Procedure Laterality Date  . Cholecystectomy    . Coronary artery bypass graft      x4  . Appendectomy    . Knee surgery    . Abdominal hysterectomy    . Abdominal aortagram N/A 12/18/2013    Procedure: ABDOMINAL [name];  Surgeon: Wellington Hampshire, MD;  Location: [facility] CATH LAB;  Service: Cardiovascular;  Laterality: N/A;     Current Outpatient Prescriptions  Medication Sig Dispense Refill  . albuterol (PROVENTIL HFA;VENTOLIN HFA) 108 (90 BASE) MCG/ACT inhaler Inhale 2 puffs into the lungs every 6 (six) hours as needed for wheezing or shortness of breath.     . ALPRAZolam (XANAX) 1 MG tablet Take 1 tablet by mouth 4 (four) times daily as needed for anxiety.     Marland Kitchen amLODipine (NORVASC) 10 MG tablet Take 10 mg by mouth daily.     Marland Kitchen aspirin EC 81 MG tablet Take 81 mg by mouth at bedtime.    Marland Kitchen atorvastatin (LIPITOR) 80 MG tablet Take 80 mg by mouth daily.      . B-D INS SYR ULTRAFINE 1CC/31G 31G X 5/16" 1 ML MISC USE NEW NEEDLE FOR MEAL TIME 3 TIMES A DAY AND LONG ACTING INSULIN TWICE A DAY  11  . cilostazol (PLETAL) 100 MG tablet Take 1 tablet (100 mg total) by mouth 2 (  two) times daily. 180 tablet 2  . fenofibrate micronized (LOFIBRA) 200 MG capsule Take 200 mg by mouth daily before breakfast.    . ferrous sulfate 325 (65 FE) MG tablet Take 325 mg by mouth 3 (three) times daily.  3  . furosemide (LASIX) 40 MG tablet Take 40 mg by mouth 2 (two) times daily.     . Insulin Aspart (NOVOLOG [place]) Inject 30-55 Units into the skin 2 (two) times daily.     . insulin glargine (LANTUS) 100 UNIT/ML injection Inject 30-55 Units into the skin 2 (two) times daily as needed (per sliding scale based on blood sugar).     Marland Kitchen levothyroxine (SYNTHROID, LEVOTHROID) 175 MCG tablet Take 1 tablet by mouth daily.    . metoprolol succinate (TOPROL-XL) 100 MG 24 hr tablet Take 100 mg by mouth 2 (two) times daily.  Take with or immediately following a meal.    . Omega-3 Fatty Acids (FISH OIL) 1000 MG CAPS Take 1,000 mg by mouth 2 (two) times daily.    Marland Kitchen omeprazole (PRILOSEC) 40 MG capsule Take 40 mg by mouth daily.    . ondansetron (ZOFRAN) 4 MG tablet Take 4 mg by mouth every 8 (eight) hours as needed for nausea or vomiting.     Glory Rosebush VERIO test strip CHECK SUGAR AT HOME 4-5 TIMES A DAY  11  . oxyCODONE (OXYCONTIN) 20 mg 12 hr tablet Take 1 tablet by mouth 2 (two) times daily.    Marland Kitchen oxyCODONE-acetaminophen (PERCOCET) 7.5-325 MG per tablet Take 1 tablet by mouth 2 (two) times daily as needed (pain).     Marland Kitchen sertraline (ZOLOFT) 100 MG tablet Take 100 mg by mouth 2 (two) times daily.     . Vitamin D, Ergocalciferol, (DRISDOL) 50000 UNITS CAPS capsule Take 50,000 Units by mouth once a week.  1   No current facility-administered medications for this visit.    Allergies:   Review of patient's allergies indicates no known allergies.    Social History:  The patient  reports that she has quit smoking. She has never used smokeless tobacco. She reports that she does not drink alcohol or use illicit drugs.   Family History:  The patient's family history includes Colon polyps in her daughter; Diabetes in her mother; Heart disease in her father. There is no history of Colon cancer, Esophageal cancer, or Stomach cancer.    ROS:  Please see the history of present illness.   Otherwise, review of systems are positive for none.   All other systems are reviewed and negative.    PHYSICAL EXAM: VS:  BP 138/46 mmHg  Pulse 60  Ht 5\' 4"  (1.626 m)  Wt 185 lb (83.915 kg)  BMI 31.74 kg/m2 , BMI Body mass index is 31.74 kg/(m^2). GEN: Well nourished, well developed, in no acute distress HEENT: normal Neck: no JVD,  or masses. Bilateral carotid bruits. Cardiac: RRR; no rubs, or gallops,no edema . 2/6 systolic ejection murmur in the aortic area. Respiratory:  clear to auscultation bilaterally, normal work of  breathing GI: soft, nontender, nondistended, + BS MS: no deformity or atrophy Skin: warm and dry, no rash Neuro:  Strength and sensation are intact Psych: euthymic mood, full affect   EKG:  EKG is not ordered today.    Recent Labs: No results found for requested labs within last 365 days.    Lipid Panel No results found for: CHOL, TRIG, HDL, CHOLHDL, VLDL, LDLCALC, LDLDIRECT    Wt Readings from Last 3  Encounters:  12/08/15 185 lb (83.915 kg)  08/13/15 187 lb 8 oz (85.049 kg)  06/23/15 187 lb 12.8 oz (85.186 kg)         ASSESSMENT AND PLAN:  1.  Peripheral arterial disease: Severe bilateral calf claudication due to known SFA disease. She reports some worsening in symptoms which could be related to holding cilostazol. I requested lower extremity arterial duplex to ensure stability of disease. Continue treatment of risk factors. I renewed her handicapped sticker.  2. Coronary artery disease: She has no chest pain. Stable exertional dyspnea.  3. Essential hypertension: Blood pressure is reasonably controlled.  4. Hyperlipidemia: Currently on atorvastatin and fenofibrate.  5. Bilateral carotid stenosis: This has been stable on Doppler.   Disposition:   FU with me in 6 months  Signed,  [name], MD  12/08/2015 8:32 AM    [facility]

## 2015-12-08 NOTE — Patient Instructions (Signed)
Medication Instructions:  Your physician recommends that you continue on your current medications as directed. Please refer to the Current Medication list given to you today.  Labwork: No new orders.   Testing/Procedures: Your physician has requested that you have a lower extremity arterial duplex. This test is an ultrasound of the arteries in the legs. It looks at arterial blood flow in the legs. Allow one hour for Lower Arterial scans. There are no restrictions or special instructions  Follow-Up: Your physician wants you to follow-up in: 6 MONTHS with Dr Fletcher Anon.  You will receive a reminder letter in the mail two months in advance. If you don't receive a letter, please call our office to schedule the follow-up appointment.   Any Other Special Instructions Will Be Listed Below (If Applicable).     If you need a refill on your cardiac medications before your next appointment, please call your pharmacy.

## 2015-12-10 ENCOUNTER — Other Ambulatory Visit: Payer: Self-pay | Admitting: Cardiovascular Disease

## 2015-12-10 DIAGNOSIS — I739 Peripheral vascular disease, unspecified: Secondary | ICD-10-CM

## 2015-12-18 ENCOUNTER — Ambulatory Visit (HOSPITAL_COMMUNITY)
Admission: RE | Admit: 2015-12-18 | Discharge: 2015-12-18 | Disposition: A | Discharge status: Home / Self Care | Payer: BC Managed Care – PPO | Source: Ambulatory Visit | Attending: Cardiology | Admitting: Cardiology

## 2015-12-18 DIAGNOSIS — I739 Peripheral vascular disease, unspecified: Secondary | ICD-10-CM | POA: Insufficient documentation

## 2015-12-18 DIAGNOSIS — E785 Hyperlipidemia, unspecified: Secondary | ICD-10-CM | POA: Insufficient documentation

## 2015-12-18 DIAGNOSIS — I708 Atherosclerosis of other arteries: Secondary | ICD-10-CM | POA: Insufficient documentation

## 2015-12-18 DIAGNOSIS — I700 Atherosclerosis of aorta: Secondary | ICD-10-CM | POA: Diagnosis not present

## 2015-12-18 DIAGNOSIS — I10 Essential (primary) hypertension: Secondary | ICD-10-CM | POA: Diagnosis not present

## 2015-12-18 DIAGNOSIS — I2510 Atherosclerotic heart disease of native coronary artery without angina pectoris: Secondary | ICD-10-CM | POA: Insufficient documentation

## 2015-12-18 DIAGNOSIS — F329 Major depressive disorder, single episode, unspecified: Secondary | ICD-10-CM | POA: Diagnosis not present

## 2015-12-18 DIAGNOSIS — E119 Type 2 diabetes mellitus without complications: Secondary | ICD-10-CM | POA: Diagnosis not present

## 2015-12-18 DIAGNOSIS — K219 Gastro-esophageal reflux disease without esophagitis: Secondary | ICD-10-CM | POA: Diagnosis not present

## 2015-12-18 DIAGNOSIS — F419 Anxiety disorder, unspecified: Secondary | ICD-10-CM | POA: Insufficient documentation

## 2015-12-22 ENCOUNTER — Ambulatory Visit: Payer: Self-pay | Admitting: Cardiovascular Disease

## 2016-02-09 ENCOUNTER — Ambulatory Visit (HOSPITAL_COMMUNITY): Payer: Self-pay | Admitting: Psychiatry

## 2016-03-17 ENCOUNTER — Ambulatory Visit (HOSPITAL_COMMUNITY): Payer: Self-pay | Admitting: Psychiatry
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
## 2016-04-04 ENCOUNTER — Encounter (HOSPITAL_COMMUNITY): Payer: Self-pay | Admitting: Psychiatry

## 2016-04-04 ENCOUNTER — Ambulatory Visit (INDEPENDENT_AMBULATORY_CARE_PROVIDER_SITE_OTHER): Payer: BC Managed Care – PPO | Admitting: Psychiatry

## 2016-04-04 ENCOUNTER — Encounter (INDEPENDENT_AMBULATORY_CARE_PROVIDER_SITE_OTHER): Payer: Self-pay

## 2016-04-04 VITALS — BP 122/66 | HR 73 | Ht 64.0 in | Wt 177.8 lb

## 2016-04-04 DIAGNOSIS — F331 Major depressive disorder, recurrent, moderate: Secondary | ICD-10-CM

## 2016-04-04 NOTE — Patient Instructions (Signed)
1. Continue current medication 2. Will make a referral to therapist 3. Return to clinic in three months

## 2016-04-06 ENCOUNTER — Telehealth: Payer: Self-pay | Admitting: Cardiovascular Disease

## 2016-04-06 NOTE — Telephone Encounter (Signed)
I left the pt a voicemail that there is a reminder in EPIC for her to schedule an appointment with Dr Fletcher Anon.  I instructed that the pt should call [phone] to schedule Dr Fletcher Anon follow-up appointment.

## 2016-04-06 NOTE — Telephone Encounter (Signed)
[name] stopped by saying she usually sees Dr. Fletcher Anon every six months. She last saw him in June but she's not scheduled for a December appointment yet. She'd like a phone call concerning this so she can be scheduled.   Pt's ph# [phone] Thank you.

## 2016-04-12 ENCOUNTER — Other Ambulatory Visit: Payer: Self-pay | Admitting: Physician Assistant

## 2016-04-12 DIAGNOSIS — Z1231 Encounter for screening mammogram for malignant neoplasm of breast: Secondary | ICD-10-CM

## 2016-04-20 ENCOUNTER — Ambulatory Visit (INDEPENDENT_AMBULATORY_CARE_PROVIDER_SITE_OTHER): Payer: BC Managed Care – PPO | Admitting: Clinical

## 2016-04-20 ENCOUNTER — Encounter: Payer: Self-pay | Admitting: Internal Medicine

## 2016-04-20 DIAGNOSIS — F331 Major depressive disorder, recurrent, moderate: Secondary | ICD-10-CM | POA: Diagnosis not present

## 2016-04-20 NOTE — Progress Notes (Signed)
This is an empty note  [name]

## 2016-04-26 ENCOUNTER — Ambulatory Visit
Admission: RE | Admit: 2016-04-26 | Discharge: 2016-04-26 | Disposition: A | Discharge status: Home / Self Care | Payer: BC Managed Care – PPO | Source: Ambulatory Visit | Attending: Physician Assistant | Admitting: Physician Assistant

## 2016-04-26 DIAGNOSIS — Z1231 Encounter for screening mammogram for malignant neoplasm of breast: Secondary | ICD-10-CM

## 2016-04-27 ENCOUNTER — Encounter (HOSPITAL_COMMUNITY): Payer: Self-pay | Admitting: Clinical
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
## 2016-05-09 ENCOUNTER — Ambulatory Visit (HOSPITAL_COMMUNITY): Payer: Self-pay | Admitting: Clinical

## 2016-05-12 ENCOUNTER — Ambulatory Visit: Payer: Self-pay | Admitting: Physician Assistant

## 2016-05-13 ENCOUNTER — Encounter: Payer: Self-pay | Admitting: Physician Assistant

## 2016-05-13 ENCOUNTER — Encounter (INDEPENDENT_AMBULATORY_CARE_PROVIDER_SITE_OTHER): Payer: Self-pay

## 2016-05-13 ENCOUNTER — Ambulatory Visit (INDEPENDENT_AMBULATORY_CARE_PROVIDER_SITE_OTHER): Payer: Medicare Other | Admitting: Physician Assistant

## 2016-05-13 ENCOUNTER — Telehealth: Payer: Self-pay | Admitting: *Deleted

## 2016-05-13 VITALS — BP 142/62 | HR 72 | Ht 63.0 in | Wt 175.4 lb

## 2016-05-13 DIAGNOSIS — Z7901 Long term (current) use of anticoagulants: Secondary | ICD-10-CM | POA: Diagnosis not present

## 2016-05-13 DIAGNOSIS — Z86010 Personal history of colonic polyps: Secondary | ICD-10-CM | POA: Diagnosis not present

## 2016-05-13 DIAGNOSIS — Z1211 Encounter for screening for malignant neoplasm of colon: Secondary | ICD-10-CM

## 2016-05-13 DIAGNOSIS — I6523 Occlusion and stenosis of bilateral carotid arteries: Secondary | ICD-10-CM | POA: Diagnosis not present

## 2016-05-13 MED ORDER — NA SULFATE-K SULFATE-MG SULF 17.5-3.13-1.6 GM/177ML PO SOLN: 1.0000 | Freq: Once | ORAL | 0 refills | Status: AC

## 2016-05-13 NOTE — Progress Notes (Signed)
Subjective:    Patient ID: [name], female    DOB: October 28, 1955, 60 y.o.   MRN: [id number]  HPI [name] is a pleasant 60 year old white female known to Dr. Scarlette Shorts. She has history of adenomatous colon polyps comes in today to discuss recall colonoscopy. Her last colonoscopy was done in November 2012 for history of adenomatous polyps. At that time she had one diminutive polyp removed from the left colon which was hyperplastic. She was recommended for 5 year interval follow-up. She was also seen here in 2013 for iron deficiency anemia and underwent EGD which was normal, and a capsule endoscopy which was negative with the exception of one small erosion. Patient also has history of coronary artery disease status post CABG in 2004, chronic kidney disease stage III, adult-onset diabetes mellitus, major depression, COPD, bilateral carotid stenosis, and peripheral vascular disease for which she is followed by Dr.Arida She's currently on a baby aspirin and Pletal daily. She does have claudication symptoms. She has no current complaints of abdominal pain or changes in bowel habits no melena or hematochezia. She does tell me that she had some occult bleeding after her last colonoscopy and is concerned because she bleeds easily.  Review of Systems Pertinent positive and negative review of systems were noted in the above HPI section.  All other review of systems was otherwise negative.  Outpatient Encounter Prescriptions as of 05/13/2016  Medication Sig  . albuterol (PROVENTIL HFA;VENTOLIN HFA) 108 (90 BASE) MCG/ACT inhaler Inhale 2 puffs into the lungs every 6 (six) hours as needed for wheezing or shortness of breath.   . ALPRAZolam (XANAX) 1 MG tablet Take 1 tablet by mouth 4 (four) times daily as needed for anxiety.   Marland Kitchen amLODipine (NORVASC) 10 MG tablet Take 10 mg by mouth daily.   Marland Kitchen aspirin EC 81 MG tablet Take 81 mg by mouth at bedtime.  Marland Kitchen atorvastatin (LIPITOR) 80 MG tablet Take 80 mg by mouth  daily.    . B-D INS SYR ULTRAFINE 1CC/31G 31G X 5/16" 1 ML MISC USE NEW NEEDLE FOR MEAL TIME 3 TIMES A DAY AND LONG ACTING INSULIN TWICE A DAY  . cilostazol (PLETAL) 100 MG tablet Take 1 tablet (100 mg total) by mouth 2 (two) times daily.  . fenofibrate micronized (LOFIBRA) 200 MG capsule Take 200 mg by mouth daily before breakfast.  . ferrous sulfate 325 (65 FE) MG tablet Take 325 mg by mouth 3 (three) times daily.  . furosemide (LASIX) 40 MG tablet Take 40 mg by mouth 2 (two) times daily.   . Insulin Aspart (NOVOLOG [place]) Inject 30-55 Units into the skin 2 (two) times daily.   . insulin glargine (LANTUS) 100 UNIT/ML injection Inject 30-55 Units into the skin 2 (two) times daily as needed (per sliding scale based on blood sugar).   Marland Kitchen levothyroxine (SYNTHROID, LEVOTHROID) 175 MCG tablet Take 1 tablet by mouth daily.  . metoprolol succinate (TOPROL-XL) 100 MG 24 hr tablet Take 100 mg by mouth 2 (two) times daily. Take with or immediately following a meal.  . Omega-3 Fatty Acids (FISH OIL) 1000 MG CAPS Take 1,000 mg by mouth 2 (two) times daily.  Marland Kitchen omeprazole (PRILOSEC) 40 MG capsule Take 40 mg by mouth daily.  . ondansetron (ZOFRAN) 4 MG tablet Take 4 mg by mouth every 8 (eight) hours as needed for nausea or vomiting.   Glory Rosebush VERIO test strip CHECK SUGAR AT HOME 4-5 TIMES A DAY  . oxyCODONE (OXYCONTIN) 20 mg  12 hr tablet Take 1 tablet by mouth 2 (two) times daily.  Marland Kitchen oxyCODONE-acetaminophen (PERCOCET) 7.5-325 MG per tablet Take 1 tablet by mouth 2 (two) times daily as needed (pain).   Marland Kitchen PARoxetine (PAXIL) 40 MG tablet Take 40 mg by mouth daily.  . Vitamin D, Ergocalciferol, (DRISDOL) 50000 UNITS CAPS capsule Take 50,000 Units by mouth once a week.  . Na Sulfate-K Sulfate-Mg Sulf 17.5-3.13-1.6 GM/180ML SOLN Take 1 kit by mouth once.   No facility-administered encounter medications on file as of 05/13/2016.    No Known Allergies Patient Active Problem List   Diagnosis Date Noted  . Major  depressive disorder, recurrent episode, moderate ([place]) 04/04/2016  . Disorder resulting from impaired renal function 10/27/2009  . Bilateral carotid artery stenosis 10/08/2009  . Essential hypertension 10/02/2008  . GERD 03/11/2008  . COLONIC POLYPS 03/10/2008  . Type II or unspecified type diabetes mellitus without mention of complication, not stated as uncontrolled 03/10/2008  . Hyperlipidemia 03/10/2008  . ANXIETY DEPRESSION 03/10/2008  . Coronary atherosclerosis 03/10/2008  . Peripheral vascular disease ([place]) 03/10/2008  . DIVERTICULOSIS, COLON 03/10/2008   Social History   Social History  . Marital status: Widowed    Spouse name: N/A  . Number of children: 2  . Years of education: N/A   Occupational History  . [employer]    retired    Social History Main Topics  . Smoking status: Former Research scientist (life sciences)  . Smokeless tobacco: Never Used  . Alcohol use No  . Drug use: No  . Sexual activity: Not on file   Other Topics Concern  . Not on file   Social History Narrative   Daily Caffeine    [name]'s family history includes Colon polyps in her daughter; Diabetes in her mother; Heart disease in her father.      Objective:    Vitals:   05/13/16 0835  BP: (!) 142/62  Pulse: 72    Physical Exam well-developed white female in no acute distress, pleasant blood pressure 142/62 pulse 72, BMI 31.0. HEENT; nontraumatic normocephalic EOMI PERRLA sclera anicteric, Cardiovascular; regular rate and rhythm with S1-S2 no murmur or gallop,, sternal incisional scar Pulmonary; clear bilaterally, Abdomen; soft, nontender nondistended bowel sounds are active there is no palpable mass or hepatosplenomegaly bowel sounds are present, Rectal; exam not done, Extremities; no clubbing cyanosis or edema skin warm and dry, Neuropsych; mood and affect appropriate       Assessment & Plan:   #23 60 year old female with prior history of adenomatous colon polyps due for follow-up  colonoscopy  Last exam November 2012 with one small hyperplastic polyp removed #2 peripheral vascular disease on aspirin and Pletal #3 coronary artery disease status post CABG 2004 #4 chronic kidney disease #5 COPD #6 para bilateral carotid stenosis #7 major depressive disorder #8 prior history of iron deficiency anemia  Plan; patient will be scheduled for colonoscopy with Dr. Henrene Pastor. Procedure was discussed in detail with patient including risks and benefits and she is agreeable to proceed. Patient prefers to stop Pletal prior to the procedure because of her concerns of bleeding. We'll communicate with her cardiologist Dr Fletcher Anon  To assure  that holding Pletal for 5 days prior to colonoscopy and continuing baby aspirin is acceptable for this patient.  Mercedes Wiltsie S Kendale Rembold PA-C 05/13/2016   Cc: Nicholes Rough, PA-C

## 2016-05-13 NOTE — Patient Instructions (Addendum)
You have been scheduled for a colonoscopy. Please follow written instructions given to you at your visit today.  Please pick up your prep supplies at the pharmacy within the next 1-3 days. [facility].  If you use inhalers (even only as needed), please bring them with you on the day of your procedure. Your physician has requested that you go to www.startemmi.com and enter the access code given to you at your visit today. This web site gives a general overview about your procedure. However, you should still follow specific instructions given to you by our office regarding your preparation for the procedure.

## 2016-05-13 NOTE — Telephone Encounter (Signed)
Pletal can be held 5 days before the procedure.  Dr. Fletcher Anon

## 2016-05-13 NOTE — Progress Notes (Signed)
Agree with initial assessment and plans 

## 2016-05-13 NOTE — Telephone Encounter (Signed)
05/13/2016   RE: [name] DOB: [birth year] MRN: [id number]   Dear Dr. [name],    We have scheduled the above patient for an endoscopic procedure. Our records show that she is on anticoagulation therapy.   Please advise as to how long the patient may come off her therapy of Pletal prior to the procedure, which is scheduled for 07-15-2016.  Please route the Pletal clearance instructions  to Marisue Humble CMA at [facility].   Sincerely,    Amy Esterwood PA-C

## 2016-05-16 NOTE — Telephone Encounter (Signed)
Made second call and was able to speak to the patient with her Pletal directions from Dr. Fletcher Anon.  She can hold the Pletal 5 days, including the procedure date of 1-5. She can resume it on 07-17-2015.  The patient verbalized understanding the instructions.

## 2016-05-16 NOTE — Telephone Encounter (Signed)
I called the patient and left a message with her Pletal instructions for the colonoscopy.  LM that she is to hold it from 07-11-2016 to and including 07-15-2016.  She can resume it on 07-16-2016.  LM for her to call me if she needs clarification.

## 2016-05-23 ENCOUNTER — Ambulatory Visit (HOSPITAL_COMMUNITY): Payer: Self-pay | Admitting: Clinical

## 2016-06-06 ENCOUNTER — Ambulatory Visit (HOSPITAL_COMMUNITY): Payer: Self-pay | Admitting: Clinical

## 2016-06-07 ENCOUNTER — Ambulatory Visit (INDEPENDENT_AMBULATORY_CARE_PROVIDER_SITE_OTHER): Payer: Medicare Other | Admitting: Cardiovascular Disease

## 2016-06-07 VITALS — BP 106/50 | HR 61 | Ht 63.0 in | Wt 177.0 lb

## 2016-06-07 DIAGNOSIS — I2510 Atherosclerotic heart disease of native coronary artery without angina pectoris: Secondary | ICD-10-CM | POA: Diagnosis not present

## 2016-06-07 DIAGNOSIS — I6523 Occlusion and stenosis of bilateral carotid arteries: Secondary | ICD-10-CM

## 2016-06-07 DIAGNOSIS — I10 Essential (primary) hypertension: Secondary | ICD-10-CM

## 2016-06-07 DIAGNOSIS — I739 Peripheral vascular disease, unspecified: Secondary | ICD-10-CM

## 2016-06-07 MED ORDER — ONDANSETRON HCL 4 MG PO TABS: 4.0000 mg | ORAL_TABLET | Freq: Three times a day (TID) | ORAL | 0 refills | Status: DC | PRN

## 2016-06-07 NOTE — Progress Notes (Signed)
Cardiology Office Note   Date:  06/07/2016   ID:  [name], DOB 06-Feb-1956, MRN [id number]  PCP:  Nicholes Rough, PA-C  Cardiologist:   Dr. Stanford Breed  Chief Complaint  Patient presents with  . Follow-up      History of Present Illness: [name] is a 60 y.o. female who presents for a followup visit regarding peripheral arterial disease. She has known history of  coronary artery status post coronary bypass graft surgery in 2004. She is known to have moderate bilateral carotid stenosis.  She also has prolonged history of diabetes with advanced chronic kidney disease (creatinine around 3)and possible diabetic neuropathy. 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  She has severe bilateral calf claudication worse on the left side after walking less than 50 feet. Previous angiography in June 2015 showed : 1. No significant aortoiliac disease.  2. 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 However, the profunda has significant ostial and proximal disease which likely impair collateral flow.  3. Diffuse 80% right SFA disease.  She has been treated medically. Her her symptoms improved with cilostazol.  She has been stable overall with no chest pain or shortness of breath. She has stable bilateral calf claudication but she is able to walk for 30 minutes at relatively slow pace. She has no lower extremity ulceration.   Past Medical History:  Diagnosis Date  . Adenomatous polyp of colon   . Anemia   . Asthma   . Carpal tunnel syndrome   . Chronic renal insufficiency   . Coronary artery disease   . Depression   . Depression with anxiety   . Diabetes mellitus    type II  . Diverticulosis of colon (without mention of hemorrhage)   . GERD (gastroesophageal reflux disease)   . Hyperlipidemia   . Hypertension   . Obesity   . PVD (peripheral vascular  disease) ([place])   . Thyroid disease    hypo    Past Surgical History:  Procedure Laterality Date  . ABDOMINAL AORTAGRAM N/A 12/18/2013   Procedure: ABDOMINAL [name];  Surgeon: Wellington Hampshire, MD;  Location: [facility];  Service: Cardiovascular;  Laterality: N/A;  . ABDOMINAL HYSTERECTOMY    . APPENDECTOMY    . CHOLECYSTECTOMY    . CORONARY ARTERY BYPASS GRAFT     x4  . KNEE SURGERY       Current Outpatient Prescriptions  Medication Sig Dispense Refill  . albuterol (PROVENTIL HFA;VENTOLIN HFA) 108 (90 BASE) MCG/ACT inhaler Inhale 2 puffs into the lungs every 6 (six) hours as needed for wheezing or shortness of breath.     . ALPRAZolam (XANAX) 1 MG tablet Take 1 tablet by mouth 4 (four) times daily as needed for anxiety.     Marland Kitchen amLODipine (NORVASC) 10 MG tablet Take 10 mg by mouth daily.     Marland Kitchen aspirin EC 81 MG tablet Take 81 mg by mouth at bedtime.    Marland Kitchen atorvastatin (LIPITOR) 80 MG tablet Take 80 mg by mouth daily.      . B-D INS SYR ULTRAFINE 1CC/31G 31G X 5/16" 1 ML MISC USE NEW NEEDLE FOR MEAL TIME 3 TIMES A DAY AND LONG ACTING INSULIN TWICE A DAY  11  . cilostazol (PLETAL) 100 MG tablet Take 1 tablet (100 mg total) by mouth 2 (two) times daily. [place]  tablet 2  . fenofibrate micronized (LOFIBRA) 200 MG capsule Take 200 mg by mouth daily before breakfast.    . ferrous sulfate 325 (65 FE) MG tablet Take 325 mg by mouth 3 (three) times daily.  3  . furosemide (LASIX) 40 MG tablet Take 40 mg by mouth 2 (two) times daily.     . Insulin Aspart (NOVOLOG [place]) Inject 30-55 Units into the skin 2 (two) times daily.     . insulin glargine (LANTUS) 100 UNIT/ML injection Inject 30-55 Units into the skin 2 (two) times daily as needed (per sliding scale based on blood sugar).     Marland Kitchen levothyroxine (SYNTHROID, LEVOTHROID) 175 MCG tablet Take 1 tablet by mouth daily.    . metoprolol succinate (TOPROL-XL) 100 MG 24 hr tablet Take 100 mg by mouth 2 (two) times daily. Take with or immediately following a  meal.    . Omega-3 Fatty Acids (FISH OIL) 1000 MG CAPS Take 1,000 mg by mouth 2 (two) times daily.    Marland Kitchen omeprazole (PRILOSEC) 40 MG capsule Take 40 mg by mouth daily.    . ondansetron (ZOFRAN) 4 MG tablet Take 4 mg by mouth every 8 (eight) hours as needed for nausea or vomiting.     Glory Rosebush VERIO test strip CHECK SUGAR AT HOME 4-5 TIMES A DAY  11  . oxyCODONE (OXYCONTIN) 20 mg 12 hr tablet Take 1 tablet by mouth 2 (two) times daily.    Marland Kitchen oxyCODONE-acetaminophen (PERCOCET) 7.5-325 MG per tablet Take 1 tablet by mouth 2 (two) times daily as needed (pain).     Marland Kitchen PARoxetine (PAXIL) 40 MG tablet Take 40 mg by mouth daily.    . Vitamin D, Ergocalciferol, (DRISDOL) 50000 UNITS CAPS capsule Take 50,000 Units by mouth once a week.  1   No current facility-administered medications for this visit.     Allergies:   Patient has no known allergies.    Social History:  The patient  reports that she has quit smoking. She has never used smokeless tobacco. She reports that she does not drink alcohol or use drugs.   Family History:  The patient's family history includes Colon polyps in her daughter; Diabetes in her mother; Heart disease in her father.    ROS:  Please see the history of present illness.   Otherwise, review of systems are positive for none.   All other systems are reviewed and negative.    PHYSICAL EXAM: VS:  BP (!) 106/50   Pulse 61   Ht 5\' 3"  (1.6 m)   Wt 177 lb (80.3 kg)   BMI 31.35 kg/m  , BMI Body mass index is 31.35 kg/m. GEN: Well nourished, well developed, in no acute distress HEENT: normal Neck: no JVD,  or masses. Bilateral carotid bruits. Cardiac: RRR; no rubs, or gallops,no edema . 2/6 systolic ejection murmur in the aortic area. Respiratory:  clear to auscultation bilaterally, normal work of breathing GI: soft, nontender, nondistended, + BS MS: no deformity or atrophy Skin: warm and dry, no rash Neuro:  Strength and sensation are intact Psych: euthymic mood, full  affect   EKG:  EKG is ordered today. EKG showed normal sinus rhythm with nonspecific ST and T wave changes.   Recent Labs: No results found for requested labs within last 8760 hours.    Lipid Panel No results found for: CHOL, TRIG, HDL, CHOLHDL, VLDL, LDLCALC, LDLDIRECT    Wt Readings from Last 3 Encounters:  06/07/16 177 lb (80.3 kg)  05/13/16 175 lb 6 oz (79.5 kg)  12/08/15 185 lb (83.9 kg)         ASSESSMENT AND PLAN:  1.  Peripheral arterial disease: Severe bilateral calf claudication due to known SFA disease.  Her symptoms are overall stable and currently she is able to perform activities of daily living. Continue medical therapy and reserve surgical revascularization for worsening symptoms.  2. Coronary artery disease: She has no chest pain. Stable exertional dyspnea.  3. Essential hypertension: Blood pressure is well controlled on current medications. I reviewed her labs in April which showed a creatinine of 1.98.  4. Hyperlipidemia: Currently on atorvastatin and fenofibrate. I reviewed her lipid profile in April which showed a total cholesterol of 127, triglyceride of 178 and an LDL of 64.  5. Bilateral carotid stenosis: Moderate bilateral disease. I requested a follow-up carotid Doppler.   Disposition:   FU with me in 6 months  Signed,  [name], MD  06/07/2016 8:19 AM    [place]

## 2016-06-07 NOTE — Patient Instructions (Addendum)
Medication Instructions:  Your physician recommends that you continue on your current medications as directed. Please refer to the Current Medication list given to you today.  One time refill for zofran given to the patient, #90 with no refills.    Labwork: No new orders.   Testing/Procedures: Your physician has requested that you have a carotid duplex. This test is an ultrasound of the carotid arteries in your neck. It looks at blood flow through these arteries that supply the brain with blood. Allow one hour for this exam. There are no restrictions or special instructions.  Follow-Up: Your physician wants you to follow-up in: 6 MONTHS with Dr Fletcher Anon. You will receive a reminder letter in the mail two months in advance. If you don't receive a letter, please call our office to schedule the follow-up appointment.   Any Other Special Instructions Will Be Listed Below (If Applicable).     If you need a refill on your cardiac medications before your next appointment, please call your pharmacy.

## 2016-06-20 ENCOUNTER — Ambulatory Visit (HOSPITAL_COMMUNITY): Payer: Self-pay | Admitting: Clinical

## 2016-06-23 ENCOUNTER — Other Ambulatory Visit: Payer: Self-pay | Admitting: Cardiovascular Disease

## 2016-06-23 DIAGNOSIS — I6523 Occlusion and stenosis of bilateral carotid arteries: Secondary | ICD-10-CM

## 2016-07-01 ENCOUNTER — Ambulatory Visit (HOSPITAL_COMMUNITY)
Admission: RE | Admit: 2016-07-01 | Discharge: 2016-07-01 | Disposition: A | Discharge status: Home / Self Care | Payer: Medicare Other | Source: Ambulatory Visit | Attending: Cardiology | Admitting: Cardiology

## 2016-07-01 DIAGNOSIS — E1151 Type 2 diabetes mellitus with diabetic peripheral angiopathy without gangrene: Secondary | ICD-10-CM | POA: Diagnosis not present

## 2016-07-01 DIAGNOSIS — Z87891 Personal history of nicotine dependence: Secondary | ICD-10-CM | POA: Insufficient documentation

## 2016-07-01 DIAGNOSIS — I2510 Atherosclerotic heart disease of native coronary artery without angina pectoris: Secondary | ICD-10-CM | POA: Insufficient documentation

## 2016-07-01 DIAGNOSIS — I10 Essential (primary) hypertension: Secondary | ICD-10-CM | POA: Insufficient documentation

## 2016-07-01 DIAGNOSIS — I6523 Occlusion and stenosis of bilateral carotid arteries: Secondary | ICD-10-CM | POA: Diagnosis not present

## 2016-07-01 DIAGNOSIS — Z951 Presence of aortocoronary bypass graft: Secondary | ICD-10-CM | POA: Diagnosis not present

## 2016-07-01 DIAGNOSIS — E785 Hyperlipidemia, unspecified: Secondary | ICD-10-CM | POA: Insufficient documentation

## 2016-07-14 ENCOUNTER — Ambulatory Visit (HOSPITAL_COMMUNITY): Payer: Self-pay | Admitting: Psychiatry

## 2016-07-15 ENCOUNTER — Encounter: Payer: Self-pay | Admitting: Internal Medicine

## 2016-07-15 ENCOUNTER — Ambulatory Visit (AMBULATORY_SURGERY_CENTER): Payer: Medicare Other | Admitting: Internal Medicine

## 2016-07-15 VITALS — BP 148/57 | HR 58 | Temp 97.8°F | Resp 14 | Ht 63.0 in | Wt 175.0 lb

## 2016-07-15 DIAGNOSIS — K635 Polyp of colon: Secondary | ICD-10-CM | POA: Diagnosis not present

## 2016-07-15 DIAGNOSIS — Z86010 Personal history of colonic polyps: Secondary | ICD-10-CM

## 2016-07-15 DIAGNOSIS — D123 Benign neoplasm of transverse colon: Secondary | ICD-10-CM | POA: Diagnosis not present

## 2016-07-15 DIAGNOSIS — K634 Enteroptosis: Secondary | ICD-10-CM | POA: Diagnosis not present

## 2016-07-15 DIAGNOSIS — D125 Benign neoplasm of sigmoid colon: Secondary | ICD-10-CM

## 2016-07-15 LAB — GLUCOSE, CAPILLARY
Glucose-Capillary: 147 mg/dL — ABNORMAL HIGH (ref 65–99)
Glucose-Capillary: 138 mg/dL — ABNORMAL HIGH (ref 65–99)

## 2016-07-15 MED ORDER — SODIUM CHLORIDE 0.9 % IV SOLN: 500.0000 mL | INTRAVENOUS | Status: DC

## 2016-07-15 NOTE — Progress Notes (Signed)
Called to room to assist during endoscopic procedure.  Patient ID and intended procedure confirmed with present staff. Received instructions for my participation in the procedure from the performing physician.  

## 2016-07-15 NOTE — Op Note (Signed)
[facility] Patient Name: Shameeka Silliman Procedure Date: 07/15/2016 8:52 AM MRN: [id number] Endoscopist: Docia Chuck. Henrene Pastor , MD Age: 61 Referring MD:  Date of Birth: Jun 05, 1956 Gender: Female Account #: [id number] Procedure:                Colonoscopy, with cold snare polypectomy 3 Indications:              High risk colon cancer surveillance: Personal                            history of multiple (3 or more) adenomas. Previous                            examinations 2006, 2009, 2012. Medicines:                Monitored Anesthesia Care Procedure:                Pre-Anesthesia Assessment:                           - Prior to the procedure, a History and Physical                            was performed, and patient medications and                            allergies were reviewed. The patient's tolerance of                            previous anesthesia was also reviewed. The risks                            and benefits of the procedure and the sedation                            options and risks were discussed with the patient.                            All questions were answered, and informed consent                            was obtained. Prior Anticoagulants: The patient has                            taken no previous anticoagulant or antiplatelet                            agents. ASA Grade Assessment: II - A patient with                            mild systemic disease. After reviewing the risks                            and benefits, the patient was deemed in  satisfactory condition to undergo the procedure.                           After obtaining informed consent, the colonoscope                            was passed under direct vision. Throughout the                            procedure, the patient's blood pressure, pulse, and                            oxygen saturations were monitored continuously. The                            Model  PCF-H190L [id number]) scope was introduced                            through the anus and advanced to the the cecum,                            identified by appendiceal orifice and ileocecal                            valve. The ileocecal valve, appendiceal orifice,                            and rectum were photographed. The quality of the                            bowel preparation was excellent. The colonoscopy                            was performed without difficulty. The patient                            tolerated the procedure well. The bowel preparation                            used was SUPREP. Scope In: 9:09:47 AM Scope Out: 9:23:25 AM Scope Withdrawal Time: 0 hours 9 minutes 11 seconds  Total Procedure Duration: 0 hours 13 minutes 38 seconds  Findings:                 Three polyps were found in the sigmoid colon and                            transverse colon. The polyps were 4 to 6 mm in                            size. These polyps were removed with a cold snare.                            Resection and retrieval were complete.  The exam was otherwise without abnormality on                            direct and retroflexion views. Complications:            No immediate complications. Estimated blood loss:                            None. Estimated Blood Loss:     Estimated blood loss: none. Impression:               - Three 4 to 6 mm polyps in the sigmoid colon and                            in the transverse colon, removed with a cold snare.                            Resected and retrieved.                           - The examination was otherwise normal on direct                            and retroflexion views. Recommendation:           - Repeat colonoscopy in 3 - 5 years for                            surveillance, pending pathology.                           - Patient has a contact number available for                             emergencies. The signs and symptoms of potential                            delayed complications were discussed with the                            patient. Return to normal activities tomorrow.                            Written discharge instructions were provided to the                            patient.                           - Stop smoking.                           - Continue present medications. Resume Pletal today.                           - Await pathology results. Docia Chuck. Henrene Pastor, MD 07/15/2016 9:28:26 AM This report  has been signed electronically.

## 2016-07-15 NOTE — Patient Instructions (Signed)
YOU HAD AN ENDOSCOPIC PROCEDURE TODAY AT THE [facility]:   Refer to the procedure report that was given to you for any specific questions about what was found during the examination.  If the procedure report does not answer your questions, please call your gastroenterologist to clarify.  If you requested that your care partner not be given the details of your procedure findings, then the procedure report has been included in a sealed envelope for you to review at your convenience later.  YOU SHOULD EXPECT: Some feelings of bloating in the abdomen. Passage of more gas than usual.  Walking can help get rid of the air that was put into your GI tract during the procedure and reduce the bloating. If you had a lower endoscopy (such as a colonoscopy or flexible sigmoidoscopy) you may notice spotting of blood in your stool or on the toilet paper. If you underwent a bowel prep for your procedure, you may not have a normal bowel movement for a few days.  Please Note:  You might notice some irritation and congestion in your nose or some drainage.  This is from the oxygen used during your procedure.  There is no need for concern and it should clear up in a day or so.  SYMPTOMS TO REPORT IMMEDIATELY:   Following lower endoscopy (colonoscopy or flexible sigmoidoscopy):  Excessive amounts of blood in the stool  Significant tenderness or worsening of abdominal pains  Swelling of the abdomen that is new, acute  Fever of 100F or higher    For urgent or emergent issues, a gastroenterologist can be reached at any hour by calling [phone].   DIET:  We do recommend a small meal at first, but then you may proceed to your regular diet.  Drink plenty of fluids but you should avoid alcoholic beverages for 24 hours.  ACTIVITY:  You should plan to take it easy for the rest of today and you should NOT DRIVE or use heavy machinery until tomorrow (because of the sedation medicines used during the test).     FOLLOW UP: Our staff will call the number listed on your records the next business day following your procedure to check on you and address any questions or concerns that you may have regarding the information given to you following your procedure. If we do not reach you, we will leave a message.  However, if you are feeling well and you are not experiencing any problems, there is no need to return our call.  We will assume that you have returned to your regular daily activities without incident.  If any biopsies were taken you will be contacted by phone or by letter within the next 1-3 weeks.  Please call us at [phone] if you have not heard about the biopsies in 3 weeks.    SIGNATURES/CONFIDENTIALITY: You and/or your care partner have signed paperwork which will be entered into your electronic medical record.  These signatures attest to the fact that that the information above on your After Visit Summary has been reviewed and is understood.  Full responsibility of the confidentiality of this discharge information lies with you and/or your care-partner.   Resume medications. Information given on polyps. 

## 2016-07-15 NOTE — Progress Notes (Signed)
A/ox3 pleased with MAC, report to [facility]

## 2016-07-18 ENCOUNTER — Telehealth: Payer: Self-pay | Admitting: *Deleted

## 2016-07-18 ENCOUNTER — Telehealth: Payer: Self-pay

## 2016-07-18 NOTE — Telephone Encounter (Signed)
  Follow up Call-  Call back number 07/15/2016  Post procedure Call Back phone  # [phone]  Permission to leave phone message Yes  Some recent data might be hidden     Patient questions:  Do you have a fever, pain , or abdominal swelling? No. Pain Score  0 *  Have you tolerated food without any problems? Yes.    Have you been able to return to your normal activities? Yes.    Do you have any questions about your discharge instructions: Diet   No. Medications  No. Follow up visit  No.  Do you have questions or concerns about your Care? No.  Actions: * If pain score is 4 or above: No action needed, pain <4.

## 2016-07-18 NOTE — Telephone Encounter (Signed)
Message left

## 2016-07-20 ENCOUNTER — Encounter: Payer: Self-pay | Admitting: Internal Medicine

## 2016-08-04 NOTE — Progress Notes (Signed)
HPI: FU CAD; history of coronary artery status post coronary bypass and graft in 2004. Patient is followed by Dr. Fletcher Anon for PVD. Nuclear study February 2016 showed ejection fraction 63% and normal perfusion. Carotid Dopplers in Dec 2017 showed 40-59% right and 60-79 % left stenosis. Followup was recommended in 12 months. Since I last saw her she denies dyspnea, chest pain, palpitations or syncope. She does continue to have bilateral lower extremity claudication.  Current Outpatient Prescriptions  Medication Sig Dispense Refill  . albuterol (PROVENTIL HFA;VENTOLIN HFA) 108 (90 BASE) MCG/ACT inhaler Inhale 2 puffs into the lungs every 6 (six) hours as needed for wheezing or shortness of breath.     . ALPRAZolam (XANAX) 1 MG tablet Take 1 tablet by mouth 4 (four) times daily as needed for anxiety.     Marland Kitchen amLODipine (NORVASC) 10 MG tablet Take 10 mg by mouth daily.     Marland Kitchen aspirin EC 81 MG tablet Take 81 mg by mouth at bedtime.    Marland Kitchen atorvastatin (LIPITOR) 80 MG tablet Take 80 mg by mouth daily.      . B-D INS SYR ULTRAFINE 1CC/31G 31G X 5/16" 1 ML MISC USE NEW NEEDLE FOR MEAL TIME 3 TIMES A DAY AND LONG ACTING INSULIN TWICE A DAY  11  . cilostazol (PLETAL) 100 MG tablet Take 1 tablet (100 mg total) by mouth 2 (two) times daily. 180 tablet 2  . fenofibrate micronized (LOFIBRA) 200 MG capsule Take 200 mg by mouth daily before breakfast.    . ferrous sulfate 325 (65 FE) MG tablet Take 325 mg by mouth 3 (three) times daily.  3  . furosemide (LASIX) 40 MG tablet Take 40 mg by mouth 2 (two) times daily.     . Insulin Aspart (NOVOLOG [place]) Inject 30-55 Units into the skin 2 (two) times daily.     . insulin glargine (LANTUS) 100 UNIT/ML injection Inject 30-55 Units into the skin 2 (two) times daily as needed (per sliding scale based on blood sugar).     Marland Kitchen levothyroxine (SYNTHROID, LEVOTHROID) 175 MCG tablet Take 1 tablet by mouth daily.    . metoprolol succinate (TOPROL-XL) 100 MG 24 hr tablet Take 100 mg  by mouth 2 (two) times daily. Take with or immediately following a meal.    . Omega-3 Fatty Acids (FISH OIL) 1000 MG CAPS Take 1,000 mg by mouth 2 (two) times daily.    Marland Kitchen omeprazole (PRILOSEC) 40 MG capsule Take 40 mg by mouth daily.    . ondansetron (ZOFRAN) 4 MG tablet Take 1 tablet (4 mg total) by mouth every 8 (eight) hours as needed for nausea or vomiting. 90 tablet 0  . ONETOUCH VERIO test strip CHECK SUGAR AT HOME 4-5 TIMES A DAY  11  . oxyCODONE (OXYCONTIN) 20 mg 12 hr tablet Take 1 tablet by mouth 2 (two) times daily.    Marland Kitchen oxyCODONE-acetaminophen (PERCOCET) 7.5-325 MG per tablet Take 1 tablet by mouth 2 (two) times daily as needed (pain).     Marland Kitchen PARoxetine (PAXIL) 40 MG tablet Take 40 mg by mouth daily.    . promethazine (PHENERGAN) 25 MG tablet Take 1 tablet by mouth as needed.  6  . Vitamin D, Ergocalciferol, (DRISDOL) 50000 UNITS CAPS capsule Take 50,000 Units by mouth once a week.  1   Current Facility-Administered Medications  Medication Dose Route Frequency Provider Last Rate Last Dose  . 0.9 %  sodium chloride infusion  500 mL Intravenous Continuous Jenny Reichmann  Delice Lesch, MD         Past Medical History:  Diagnosis Date  . Adenomatous polyp of colon   . Anemia   . Asthma   . Carpal tunnel syndrome   . Chronic renal insufficiency   . Coronary artery disease   . Depression   . Depression with anxiety   . Diabetes mellitus    type II  . Diverticulosis of colon (without mention of hemorrhage)   . GERD (gastroesophageal reflux disease)   . Hyperlipidemia   . Hypertension   . Obesity   . PVD (peripheral vascular disease) ([place])   . Thyroid disease    hypo    Past Surgical History:  Procedure Laterality Date  . ABDOMINAL AORTAGRAM N/A 12/18/2013   Procedure: ABDOMINAL [name];  Surgeon: Wellington Hampshire, MD;  Location: [facility];  Service: Cardiovascular;  Laterality: N/A;  . ABDOMINAL HYSTERECTOMY    . APPENDECTOMY    . CHOLECYSTECTOMY    . CORONARY ARTERY BYPASS GRAFT      x4  . KNEE SURGERY      Social History   Social History  . Marital status: Widowed    Spouse name: N/A  . Number of children: 2  . Years of education: N/A   Occupational History  . [place]    retired    Social History Main Topics  . Smoking status: Former Smoker    Quit date: 2004  . Smokeless tobacco: Never Used  . Alcohol use No  . Drug use: No  . Sexual activity: Not on file   Other Topics Concern  . Not on file   Social History Narrative   Daily Caffeine    Family History  Problem Relation Age of Onset  . Diabetes Mother   . Heart disease Father   . Colon polyps Daughter   . Colon cancer Neg Hx   . Esophageal cancer Neg Hx   . Stomach cancer Neg Hx   . Rectal cancer Neg Hx     ROS: Bilateral lower extremity claudication but no fevers or chills, productive cough, hemoptysis, dysphasia, odynophagia, melena, hematochezia, dysuria, hematuria, rash, seizure activity, orthopnea, PND, pedal edema. Remaining systems are negative.  Physical Exam: Well-developed well-nourished in no acute distress.  Skin is warm and dry.  HEENT is normal.  Neck is supple.  Chest is clear to auscultation with normal expansion.  Cardiovascular exam is regular rate and rhythm. 2/6 systolic ejection murmur. Abdominal exam nontender or distended. No masses palpated. Extremities show no edema. neuro grossly intact  A/P  1 Coronary artery disease-continue aspirin and statin. No recent chest pain.  2 carotid artery disease-plan follow-up carotid Dopplers December 2018.  3 hypertension-blood pressure controlled. Continue present medications.  4 hyperlipidemia-continue statin. Lipids and liver monitored by primary care.  5 peripheral vascular disease-continue aspirin and statin. Followed by Dr. Fletcher Anon.  Kirk Ruths, MD

## 2016-08-12 ENCOUNTER — Encounter: Payer: Self-pay | Admitting: Cardiology

## 2016-08-12 ENCOUNTER — Ambulatory Visit (INDEPENDENT_AMBULATORY_CARE_PROVIDER_SITE_OTHER): Payer: Medicare Other | Admitting: Cardiology

## 2016-08-12 VITALS — BP 127/57 | HR 60 | Ht 64.0 in | Wt 169.2 lb

## 2016-08-12 DIAGNOSIS — E7800 Pure hypercholesterolemia, unspecified: Secondary | ICD-10-CM | POA: Diagnosis not present

## 2016-08-12 DIAGNOSIS — I2510 Atherosclerotic heart disease of native coronary artery without angina pectoris: Secondary | ICD-10-CM

## 2016-08-12 DIAGNOSIS — I10 Essential (primary) hypertension: Secondary | ICD-10-CM

## 2016-08-12 NOTE — Patient Instructions (Signed)
Your physician wants you to follow-up in: ONE YEAR WITH DR CRENSHAW You will receive a reminder letter in the mail two months in advance. If you don't receive a letter, please call our office to schedule the follow-up appointment.   If you need a refill on your cardiac medications before your next appointment, please call your pharmacy.  

## 2016-09-23 ENCOUNTER — Other Ambulatory Visit: Payer: Self-pay

## 2016-09-23 MED ORDER — CILOSTAZOL 100 MG PO TABS: 100.0000 mg | ORAL_TABLET | Freq: Two times a day (BID) | ORAL | 2 refills | Status: DC

## 2016-11-15 ENCOUNTER — Ambulatory Visit (INDEPENDENT_AMBULATORY_CARE_PROVIDER_SITE_OTHER): Payer: Medicare Other | Admitting: Cardiovascular Disease

## 2016-11-15 ENCOUNTER — Encounter: Payer: Self-pay | Admitting: Cardiovascular Disease

## 2016-11-15 VITALS — BP 110/56 | HR 65 | Ht 63.0 in | Wt 174.6 lb

## 2016-11-15 DIAGNOSIS — I2510 Atherosclerotic heart disease of native coronary artery without angina pectoris: Secondary | ICD-10-CM | POA: Diagnosis not present

## 2016-11-15 DIAGNOSIS — E7800 Pure hypercholesterolemia, unspecified: Secondary | ICD-10-CM | POA: Diagnosis not present

## 2016-11-15 DIAGNOSIS — I10 Essential (primary) hypertension: Secondary | ICD-10-CM | POA: Diagnosis not present

## 2016-11-15 DIAGNOSIS — I739 Peripheral vascular disease, unspecified: Secondary | ICD-10-CM

## 2016-11-15 NOTE — Progress Notes (Signed)
Cardiology Office Note   Date:  11/15/2016   ID:  [name], DOB [birth year], MRN [id number]  PCP:  Nicholes Rough, PA-C  Cardiologist:   Dr. Stanford Breed  Chief Complaint  Patient presents with  . Follow-up    PVD/non healing ulcer on left foot       History of Present Illness: [name] is a 61 y.o. female who presents for a followup visit regarding peripheral arterial disease. She has known history of  coronary artery status post coronary bypass graft surgery in 2004. She is known to have moderate bilateral carotid stenosis.  She also has prolonged history of diabetes with advanced chronic kidney disease (creatinine around 3) and  diabetic neuropathy. 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  She has severe bilateral calf claudication worse on the left side after walking less than 50 feet. Previous angiography in June 2015 showed : 1. No significant aortoiliac disease.  2. 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 However, the profunda has significant ostial and proximal disease which likely impair collateral flow.  3. Diffuse 80% right SFA disease.  She was treated medically for claudication but she developed ulceration on the left heel in December which started as a fissure and has been expanding since then. She would develop similar ulceration on the right foot which is smaller. She has severe rest pain bilaterally. She was seen at the wound clinic at the wound at [facility] by Dr. Phylis Bougie. An ABI was done and it was less than 0.2 bilaterally.   Past Medical History:  Diagnosis Date  . Adenomatous polyp of colon   . Anemia   . Asthma   . Carpal tunnel syndrome   . Chronic renal insufficiency   . Coronary artery disease   . Depression   . Depression with anxiety   . Diabetes mellitus    type II  . Diverticulosis of colon (without mention of  hemorrhage)   . GERD (gastroesophageal reflux disease)   . Hyperlipidemia   . Hypertension   . Obesity   . PVD (peripheral vascular disease) ([place])   . Thyroid disease    hypo    Past Surgical History:  Procedure Laterality Date  . ABDOMINAL AORTAGRAM N/A 12/18/2013   Procedure: ABDOMINAL [name];  Surgeon: Wellington Hampshire, MD;  Location: [facility];  Service: Cardiovascular;  Laterality: N/A;  . ABDOMINAL HYSTERECTOMY    . APPENDECTOMY    . CHOLECYSTECTOMY    . CORONARY ARTERY BYPASS GRAFT     x4  . KNEE SURGERY       Current Outpatient Prescriptions  Medication Sig Dispense Refill  . albuterol (PROVENTIL HFA;VENTOLIN HFA) 108 (90 BASE) MCG/ACT inhaler Inhale 2 puffs into the lungs every 6 (six) hours as needed for wheezing or shortness of breath.     . ALPRAZolam (XANAX) 1 MG tablet Take 1 mg by mouth daily as needed for anxiety.     Marland Kitchen amLODipine (NORVASC) 10 MG tablet Take 10 mg by mouth at bedtime.     Marland Kitchen aspirin EC 81 MG tablet Take 81 mg by mouth at bedtime.    Marland Kitchen atorvastatin (LIPITOR) 80 MG tablet Take 80 mg by mouth daily.      . B-D INS SYR ULTRAFINE 1CC/31G 31G X 5/16" 1 ML MISC USE NEW NEEDLE FOR MEAL TIME 3 TIMES A  DAY AND LONG ACTING INSULIN TWICE A DAY  11  . fenofibrate micronized (LOFIBRA) 200 MG capsule Take 200 mg by mouth at bedtime.     . ferrous sulfate 325 (65 FE) MG tablet Take 325 mg by mouth 3 (three) times daily.  3  . furosemide (LASIX) 40 MG tablet Take 40 mg by mouth 2 (two) times daily.     . insulin glargine (LANTUS) 100 UNIT/ML injection Inject 50-55 Units into the skin 2 (two) times daily as needed (per sliding scale based on blood sugar).     Marland Kitchen levothyroxine (SYNTHROID, LEVOTHROID) 175 MCG tablet Take 175 mcg by mouth daily before breakfast.     . Omega-3 Fatty Acids (FISH OIL) 1000 MG CAPS Take 1,000 mg by mouth 2 (two) times daily.    Marland Kitchen omeprazole (PRILOSEC) 40 MG capsule Take 40 mg by mouth daily.    Glory Rosebush VERIO test strip CHECK SUGAR  AT HOME 4-5 TIMES A DAY  11  . PARoxetine (PAXIL) 40 MG tablet Take 40 mg by mouth at bedtime.     . promethazine (PHENERGAN) 25 MG tablet Take 25 mg by mouth daily as needed for nausea or vomiting.   6  . Vitamin D, Ergocalciferol, (DRISDOL) 50000 UNITS CAPS capsule Take 50,000 Units by mouth once a week.  1  . cilostazol (PLETAL) 100 MG tablet Take 100 mg by mouth 2 (two) times daily.  2  . insulin aspart (NOVOLOG) 100 UNIT/ML injection Inject 5-15 Units into the skin 3 (three) times daily as needed for high blood sugar.    . metoprolol (LOPRESSOR) 100 MG tablet Take 150 mg by mouth 2 (two) times daily.    Marland Kitchen oxyCODONE (OXYCONTIN) 15 mg 12 hr tablet Take 15 mg by mouth every 12 (twelve) hours.    Marland Kitchen oxyCODONE-acetaminophen (PERCOCET) 10-325 MG tablet Take 1 tablet by mouth 3 (three) times daily as needed for pain.     Current Facility-Administered Medications  Medication Dose Route Frequency Provider Last Rate Last Dose  . 0.9 %  sodium chloride infusion  500 mL Intravenous Continuous Irene Shipper, MD        Allergies:   Patient has no known allergies.    Social History:  The patient  reports that she quit smoking about 14 years ago. She has never used smokeless tobacco. She reports that she does not drink alcohol or use drugs.   Family History:  The patient's family history includes Colon polyps in her daughter; Diabetes in her mother; Heart disease in her father.    ROS:  Please see the history of present illness.   Otherwise, review of systems are positive for none.   All other systems are reviewed and negative.    PHYSICAL EXAM: VS:  BP (!) 110/56   Pulse 65   Ht 5\' 3"  (1.6 m)   Wt 174 lb 9.6 oz (79.2 kg)   BMI 30.93 kg/m  , BMI Body mass index is 30.93 kg/m. GEN: Well nourished, well developed, in no acute distress  HEENT: normal  Neck: no JVD,  or masses. Bilateral carotid bruits. Cardiac: RRR; no rubs, or gallops,no edema . 2/6 systolic ejection murmur in the aortic  area. Respiratory:  clear to auscultation bilaterally, normal work of breathing GI: soft, nontender, nondistended, + BS MS: no deformity or atrophy  Skin: warm and dry, no rash Neuro:  Strength and sensation are intact Psych: euthymic mood, full affect Vascular: Femoral pulses are mildly diminished bilaterally. Distal  pulses are not palpable. There is a deep ulceration affecting the left heel and a small fissure affecting the right posterior foot.  EKG:  EKG is ordered today. EKG showed normal sinus rhythm with nonspecific ST and T wave changes.   Recent Labs: No results found for requested labs within last 8760 hours.    Lipid Panel No results found for: CHOL, TRIG, HDL, CHOLHDL, VLDL, LDLCALC, LDLDIRECT    Wt Readings from Last 3 Encounters:  11/15/16 174 lb 9.6 oz (79.2 kg)  08/12/16 169 lb 3.2 oz (76.7 kg)  07/15/16 175 lb (79.4 kg)         ASSESSMENT AND PLAN:  1.  Peripheral arterial disease with critical limb ischemia manifested by severe rest pain and ulceration worse on the left side. She is at high risk for limb loss and amputation with critically low ABI bilaterally. I recommend proceeding with urgent angiography and possible endovascular intervention. Given the long occlusion of the SFA on the left, she might require surgical revascularization. I discontinued cilostazol. I discussed the procedure in details as well as risk and benefits. Unfortunately, she has advanced chronic kidney disease and she is at high risk for contrast-induced nephropathy and the need for dialysis. This was discussed with her in details. I will plan on using CO2 initially and will hydrate her before and after.  2. Coronary artery disease: She has no chest pain. Stable exertional dyspnea.  3. Essential hypertension: Blood pressure is well controlled on current medications. I  4. Hyperlipidemia: Currently on atorvastatin and fenofibrate.  5. Bilateral carotid stenosis: Moderate bilateral  disease.   6. Cardiac murmur suggestive of aortic stenosis. If she requires surgical revascularization, she will require at least an echocardiogram.   Disposition:   FU with me in 1 months  Signed,  [name], MD  11/15/2016 5:36 PM    [place]

## 2016-11-15 NOTE — Patient Instructions (Addendum)
Medication Instructions:  Your physician has recommended you make the following change in your medication:  1. STOP Pletal (Cilostazol)  Labwork: No new orders.   Testing/Procedures: Your physician has requested that you have a peripheral vascular angiogram. This exam is performed at the hospital. During this exam IV contrast is used to look at arterial blood flow. Please review the information sheet given for details.    [facility] [address] [place] [address] Dept: [phone] Loc: [place]  11/15/2016  You are scheduled for a Peripheral Angiogram on Wednesday, May 9 with Dr. [name].  1. Please arrive at the [facility] (Main Entrance A) at [facility]: [place], [place] [address] at 9:00 AM (two hours before your procedure to ensure your preparation). Free valet parking service is available.   Special note: Every effort is made to have your procedure done on time. Please understand that emergencies sometimes delay scheduled procedures.  2. Diet: Do not eat or drink anything after midnight prior to your procedure except sips of water to take medications.  3. Labs: Your labs will be performed at the hospital after you arrive for your procedure.  4. Medication instructions in preparation for your procedure:  Do not take Furosemide the morning of procedure.  Take only half your normal dosage/ units of insulin the night before your procedure. Do not take any insulin on the day of the procedure.  On the morning of your procedure, take your Aspirin and any morning medicines NOT listed above.  You may use sips of water.  5. Plan for one night stay--bring personal belongings.  6. Bring a current list of your medications and current insurance cards.  7. You MUST have a responsible person to drive you home.  8. Someone MUST be with you the  first 24 hours after you arrive home or your discharge will be delayed.  9. Please wear clothes that are easy to get on and off and wear slip-on shoes.  Thank you for allowing [place] to care for you!   -- [facility]   Follow-Up: Your physician recommends that you schedule a follow-up appointment in: 1 MONTH with Dr Fletcher Anon   Any Other Special Instructions Will Be Listed Below (If Applicable).     If you need a refill on your cardiac medications before your next appointment, please call your pharmacy.

## 2016-11-16 ENCOUNTER — Other Ambulatory Visit: Payer: Self-pay

## 2016-11-16 ENCOUNTER — Encounter (HOSPITAL_COMMUNITY)
Admission: RE | Disposition: A | Discharge status: Home / Self Care | Payer: Self-pay | Source: Ambulatory Visit | Attending: Cardiovascular Disease

## 2016-11-16 ENCOUNTER — Encounter (HOSPITAL_COMMUNITY): Payer: Self-pay | Admitting: *Deleted

## 2016-11-16 ENCOUNTER — Ambulatory Visit (HOSPITAL_COMMUNITY)
Admission: RE | Admit: 2016-11-16 | Discharge: 2016-11-16 | Disposition: A | Discharge status: Home / Self Care | Payer: Medicare Other | Source: Ambulatory Visit | Attending: Cardiovascular Disease | Admitting: Cardiovascular Disease

## 2016-11-16 DIAGNOSIS — N183 Chronic kidney disease, stage 3 (moderate): Secondary | ICD-10-CM | POA: Insufficient documentation

## 2016-11-16 DIAGNOSIS — Z951 Presence of aortocoronary bypass graft: Secondary | ICD-10-CM | POA: Diagnosis not present

## 2016-11-16 DIAGNOSIS — L97429 Non-pressure chronic ulcer of left heel and midfoot with unspecified severity: Secondary | ICD-10-CM | POA: Insufficient documentation

## 2016-11-16 DIAGNOSIS — I739 Peripheral vascular disease, unspecified: Secondary | ICD-10-CM | POA: Diagnosis present

## 2016-11-16 DIAGNOSIS — J45909 Unspecified asthma, uncomplicated: Secondary | ICD-10-CM | POA: Diagnosis not present

## 2016-11-16 DIAGNOSIS — Z7982 Long term (current) use of aspirin: Secondary | ICD-10-CM | POA: Diagnosis not present

## 2016-11-16 DIAGNOSIS — E079 Disorder of thyroid, unspecified: Secondary | ICD-10-CM | POA: Diagnosis not present

## 2016-11-16 DIAGNOSIS — K219 Gastro-esophageal reflux disease without esophagitis: Secondary | ICD-10-CM | POA: Insufficient documentation

## 2016-11-16 DIAGNOSIS — I2510 Atherosclerotic heart disease of native coronary artery without angina pectoris: Secondary | ICD-10-CM | POA: Insufficient documentation

## 2016-11-16 DIAGNOSIS — I70229 Atherosclerosis of native arteries of extremities with rest pain, unspecified extremity: Secondary | ICD-10-CM

## 2016-11-16 DIAGNOSIS — I998 Other disorder of circulatory system: Secondary | ICD-10-CM

## 2016-11-16 DIAGNOSIS — Z6830 Body mass index (BMI) 30.0-30.9, adult: Secondary | ICD-10-CM | POA: Insufficient documentation

## 2016-11-16 DIAGNOSIS — I70244 Atherosclerosis of native arteries of left leg with ulceration of heel and midfoot: Secondary | ICD-10-CM

## 2016-11-16 DIAGNOSIS — I70221 Atherosclerosis of native arteries of extremities with rest pain, right leg: Secondary | ICD-10-CM | POA: Insufficient documentation

## 2016-11-16 DIAGNOSIS — R011 Cardiac murmur, unspecified: Secondary | ICD-10-CM

## 2016-11-16 DIAGNOSIS — E1151 Type 2 diabetes mellitus with diabetic peripheral angiopathy without gangrene: Secondary | ICD-10-CM | POA: Diagnosis not present

## 2016-11-16 DIAGNOSIS — F418 Other specified anxiety disorders: Secondary | ICD-10-CM | POA: Insufficient documentation

## 2016-11-16 DIAGNOSIS — E1122 Type 2 diabetes mellitus with diabetic chronic kidney disease: Secondary | ICD-10-CM | POA: Insufficient documentation

## 2016-11-16 DIAGNOSIS — Z87891 Personal history of nicotine dependence: Secondary | ICD-10-CM | POA: Insufficient documentation

## 2016-11-16 DIAGNOSIS — I129 Hypertensive chronic kidney disease with stage 1 through stage 4 chronic kidney disease, or unspecified chronic kidney disease: Secondary | ICD-10-CM | POA: Insufficient documentation

## 2016-11-16 DIAGNOSIS — E1140 Type 2 diabetes mellitus with diabetic neuropathy, unspecified: Secondary | ICD-10-CM | POA: Insufficient documentation

## 2016-11-16 DIAGNOSIS — Z794 Long term (current) use of insulin: Secondary | ICD-10-CM | POA: Diagnosis not present

## 2016-11-16 DIAGNOSIS — E11621 Type 2 diabetes mellitus with foot ulcer: Secondary | ICD-10-CM | POA: Diagnosis not present

## 2016-11-16 DIAGNOSIS — E785 Hyperlipidemia, unspecified: Secondary | ICD-10-CM | POA: Insufficient documentation

## 2016-11-16 DIAGNOSIS — I6523 Occlusion and stenosis of bilateral carotid arteries: Secondary | ICD-10-CM | POA: Diagnosis not present

## 2016-11-16 DIAGNOSIS — I70212 Atherosclerosis of native arteries of extremities with intermittent claudication, left leg: Secondary | ICD-10-CM

## 2016-11-16 DIAGNOSIS — E669 Obesity, unspecified: Secondary | ICD-10-CM | POA: Insufficient documentation

## 2016-11-16 HISTORY — PX: ABDOMINAL AORTOGRAM W/LOWER EXTREMITY: CATH118223

## 2016-11-16 LAB — CBC
HEMATOCRIT: 26.7 % — AB (ref 36.0–46.0)
HEMOGLOBIN: 8.9 g/dL — AB (ref 12.0–15.0)
MCH: 30.2 pg (ref 26.0–34.0)
MCHC: 33.3 g/dL (ref 30.0–36.0)
MCV: 90.5 fL (ref 78.0–100.0)
Platelets: 168 10*3/uL (ref 150–400)
RBC: 2.95 MIL/uL — ABNORMAL LOW (ref 3.87–5.11)
RDW: 14 % (ref 11.5–15.5)
WBC: 8 10*3/uL (ref 4.0–10.5)

## 2016-11-16 LAB — BASIC METABOLIC PANEL
ANION GAP: 9 (ref 5–15)
BUN: 42 mg/dL — AB (ref 6–20)
CHLORIDE: 104 mmol/L (ref 101–111)
CO2: 20 mmol/L — ABNORMAL LOW (ref 22–32)
Calcium: 8.3 mg/dL — ABNORMAL LOW (ref 8.9–10.3)
Creatinine, Ser: 1.8 mg/dL — ABNORMAL HIGH (ref 0.44–1.00)
GFR calc Af Amer: 34 mL/min — ABNORMAL LOW (ref >60)
GFR, EST NON AFRICAN AMERICAN: 29 mL/min — AB (ref >60)
GLUCOSE: 242 mg/dL — AB (ref 65–99)
POTASSIUM: 4.4 mmol/L (ref 3.5–5.1)
SODIUM: 133 mmol/L — AB (ref 135–145)

## 2016-11-16 LAB — GLUCOSE, CAPILLARY
GLUCOSE-CAPILLARY: 182 mg/dL — AB (ref 65–99)
GLUCOSE-CAPILLARY: 297 mg/dL — AB (ref 65–99)
Glucose-Capillary: 230 mg/dL — ABNORMAL HIGH (ref 65–99)
Glucose-Capillary: 214 mg/dL — ABNORMAL HIGH (ref 65–99)

## 2016-11-16 LAB — PROTIME-INR
INR: 1.05
Prothrombin Time: 13.8 seconds (ref 11.4–15.2)

## 2016-11-16 SURGERY — ABDOMINAL AORTOGRAM W/LOWER EXTREMITY
Anesthesia: LOCAL

## 2016-11-16 MED ORDER — SODIUM CHLORIDE 0.9% FLUSH
3.0000 mL | Freq: Two times a day (BID) | INTRAVENOUS | Status: DC
Start: 2016-11-16 — End: 2016-11-16

## 2016-11-16 MED ORDER — SODIUM CHLORIDE 0.9% FLUSH: 3.0000 mL | INTRAVENOUS | Status: DC | PRN

## 2016-11-16 MED ORDER — LIDOCAINE HCL 1 % IJ SOLN
INTRAMUSCULAR | Status: AC
  Filled 2016-11-16: qty 20

## 2016-11-16 MED ORDER — SODIUM CHLORIDE 0.9 % IV SOLN
INTRAVENOUS | Status: DC
  Administered 2016-11-16: 10:00:00 via INTRAVENOUS

## 2016-11-16 MED ORDER — LIDOCAINE HCL (PF) 1 % IJ SOLN
INTRAMUSCULAR | Status: DC | PRN
  Administered 2016-11-16: 15 mL via SUBCUTANEOUS

## 2016-11-16 MED ORDER — SODIUM CHLORIDE 0.9 % IV SOLN: INTRAVENOUS | Status: DC

## 2016-11-16 MED ORDER — SODIUM CHLORIDE 0.9% FLUSH: 3.0000 mL | Freq: Two times a day (BID) | INTRAVENOUS | Status: DC

## 2016-11-16 MED ORDER — HEPARIN (PORCINE) IN NACL 2-0.9 UNIT/ML-% IJ SOLN
INTRAMUSCULAR | Status: AC
  Filled 2016-11-16: qty 1000

## 2016-11-16 MED ORDER — MORPHINE SULFATE (PF) 4 MG/ML IV SOLN
INTRAVENOUS | Status: AC
  Filled 2016-11-16: qty 1

## 2016-11-16 MED ORDER — ASPIRIN 81 MG PO CHEW
81.0000 mg | CHEWABLE_TABLET | ORAL | Status: AC
  Administered 2016-11-16: 81 mg via ORAL

## 2016-11-16 MED ORDER — FENTANYL CITRATE (PF) 100 MCG/2ML IJ SOLN
INTRAMUSCULAR | Status: AC
  Filled 2016-11-16: qty 2

## 2016-11-16 MED ORDER — FENTANYL CITRATE (PF) 100 MCG/2ML IJ SOLN
INTRAMUSCULAR | Status: DC | PRN
  Administered 2016-11-16 (×2): 25 ug via INTRAVENOUS

## 2016-11-16 MED ORDER — MIDAZOLAM HCL 2 MG/2ML IJ SOLN
INTRAMUSCULAR | Status: DC | PRN
  Administered 2016-11-16: 1 mg via INTRAVENOUS

## 2016-11-16 MED ORDER — MIDAZOLAM HCL 2 MG/2ML IJ SOLN
INTRAMUSCULAR | Status: AC
  Filled 2016-11-16: qty 2

## 2016-11-16 MED ORDER — SODIUM CHLORIDE 0.9 % IV SOLN: 250.0000 mL | INTRAVENOUS | Status: DC | PRN

## 2016-11-16 MED ORDER — MORPHINE SULFATE (PF) 10 MG/ML IV SOLN
2.0000 mg | INTRAVENOUS | Status: DC | PRN
  Administered 2016-11-16: 2 mg via INTRAVENOUS

## 2016-11-16 MED ORDER — IODIXANOL 320 MG/ML IV SOLN
INTRAVENOUS | Status: DC | PRN
  Administered 2016-11-16: 35 mL via INTRA_ARTERIAL

## 2016-11-16 MED ORDER — ASPIRIN 81 MG PO CHEW
CHEWABLE_TABLET | ORAL | Status: AC
  Filled 2016-11-16: qty 1

## 2016-11-16 MED ORDER — HEPARIN (PORCINE) IN NACL 2-0.9 UNIT/ML-% IJ SOLN
INTRAMUSCULAR | Status: DC | PRN
  Administered 2016-11-16: 1000 mL

## 2016-11-16 SURGICAL SUPPLY — 16 items
CATH ANGIO 5F PIGTAIL 65CM (CATHETERS) ×2 IMPLANT
CATH CROSS OVER TEMPO 5F (CATHETERS) ×2 IMPLANT
CATH STRAIGHT 5FR 65CM (CATHETERS) ×2 IMPLANT
FILTER CO2 0.2 MICRON (VASCULAR PRODUCTS) ×2 IMPLANT
GUIDEWIRE ANGLED .035X150CM (WIRE) ×2 IMPLANT
KIT MICROINTRODUCER STIFF 5F (SHEATH) ×2 IMPLANT
KIT PV (KITS) ×2 IMPLANT
RESERVOIR CO2 (VASCULAR PRODUCTS) ×2 IMPLANT
SET FLUSH CO2 (MISCELLANEOUS) ×2 IMPLANT
SHEATH PINNACLE 5F 10CM (SHEATH) ×2 IMPLANT
STOPCOCK MORSE 400PSI 3WAY (MISCELLANEOUS) ×2 IMPLANT
SYRINGE MEDRAD AVANTA MACH 7 (SYRINGE) ×2 IMPLANT
TRANSDUCER W/STOPCOCK (MISCELLANEOUS) ×2 IMPLANT
TRAY PV CATH (CUSTOM PROCEDURE TRAY) ×2 IMPLANT
TUBING CIL FLEX 10 FLL-RA (TUBING) ×2 IMPLANT
WIRE HITORQ VERSACORE ST 145CM (WIRE) ×2 IMPLANT

## 2016-11-16 NOTE — Interval H&P Note (Signed)
History and Physical Interval Note:  11/16/2016 1:13 PM  [name]  has presented today for surgery, with the diagnosis of pad  The various methods of treatment have been discussed with the patient and family. After consideration of risks, benefits and other options for treatment, the patient has consented to  Procedure(s): Abdominal Aortogram w/Lower Extremity (N/A) Lower Extremity Angiography (N/A) as a surgical intervention .  The patient's history has been reviewed, patient examined, no change in status, stable for surgery.  I have reviewed the patient's chart and labs.  Questions were answered to the patient's satisfaction.     [name]

## 2016-11-16 NOTE — Consult Note (Signed)
[facility] NOTE   MRN : [id number]  Reason for Consult: Left heel ulcer Referring Physician: Dr. Fletcher Anon  History of Present Illness: 61 y/o female with left heel ulcer since Nov. 2017.  She has been going to the wound center with out success.  She also reports symptoms of claudication at 20 feet for over a year.   She has also reported rest pain that wakes her up 3-4 times a week.   She underwent agram today by Dr Fletcher Anon which showed left common femoral stenosis and left SFA above knee pop occlusion with 3 vessel runoff but primary left PT.  She has similar findings in right leg but no open wound. ABI was done 6/17 and it was less than 0.4 bilaterally.  REcent ABI at [facility] wound center 0.2 bilat.   Past medical history: CAD s/p by pass right leg vein harvest, PAD,carotid stenosis followed by Cardiologist, CKD Cr 1.8, HTN and hyperlipidemia.  She is medically managed with a statin, beta blocker, aspirin and pletal daily.        Current Facility-Administered Medications  Medication Dose Route Frequency Provider Last Rate Last Dose  . 0.9 %  sodium chloride infusion  250 mL Intravenous PRN [name] A, MD      . 0.9 %  sodium chloride infusion   Intravenous Continuous Wellington Hampshire, MD 100 mL/hr at 11/16/16 0950    . 0.9 %  sodium chloride infusion   Intravenous Continuous [name] A, MD 100 mL/hr at 11/16/16 1412 600 mL at 11/16/16 1412  . aspirin 81 MG chewable tablet           . Morphine Sulfate (PF) SOLN 2 mg  2 mg Intravenous Q2H PRN [name] A, MD      . sodium chloride flush (NS) 0.9 % injection 3 mL  3 mL Intravenous Q12H Arida, Muhammad A, MD      . sodium chloride flush (NS) 0.9 % injection 3 mL  3 mL Intravenous PRN Wellington Hampshire, MD        Pt meds include: Statin :Yes Betablocker: Yes ASA: Yes Other anticoagulants/antiplatelets: none  Past Medical History:  Diagnosis Date  . Adenomatous polyp of colon    . Anemia   . Asthma   . Carpal tunnel syndrome   . Chronic renal insufficiency   . Coronary artery disease   . Depression   . Depression with anxiety   . Diabetes mellitus    type II  . Diverticulosis of colon (without mention of hemorrhage)   . GERD (gastroesophageal reflux disease)   . Hyperlipidemia   . Hypertension   . Obesity   . PVD (peripheral vascular disease) ([place])   . Thyroid disease    hypo    Past Surgical History:  Procedure Laterality Date  . ABDOMINAL AORTAGRAM N/A 12/18/2013   Procedure: ABDOMINAL [name];  Surgeon: Wellington Hampshire, MD;  Location: [facility];  Service: Cardiovascular;  Laterality: N/A;  . ABDOMINAL HYSTERECTOMY    . APPENDECTOMY    . CHOLECYSTECTOMY    . CORONARY ARTERY BYPASS GRAFT     x4  . KNEE SURGERY      Social History Social History  Substance Use Topics  . Smoking status: Former Smoker    Quit date: 2004  . Smokeless tobacco: Never Used  . Alcohol use No    Family History Family History  Problem Relation Age of Onset  . Diabetes Mother   .  Heart disease Father   . Colon polyps Daughter   . Colon cancer Neg Hx   . Esophageal cancer Neg Hx   . Stomach cancer Neg Hx   . Rectal cancer Neg Hx     No Known Allergies   REVIEW OF SYSTEMS  General: [ ]  Weight loss, [ ]  Fever, [ ]  chills Neurologic: [ ]  Dizziness, [ ]  Blackouts, [ ]  Seizure [ ]  Stroke, [ ]  "Mini stroke", [ ]  Slurred speech, [ ]  Temporary blindness; [ ]  weakness in arms or legs, [ ]  Hoarseness [ ]  Dysphagia Cardiac: [ ]  Chest pain/pressure, [ ]  Shortness of breath at rest [ ]  Shortness of breath with exertion, [ ]  Atrial fibrillation or irregular heartbeat  Vascular: [x ] Pain in legs with walking, [x ] Pain in legs at rest, [x ] Pain in legs at night,  [x ] Non-healing ulcer, [ ]  Blood clot in vein/DVT,   Pulmonary: [ ]  Home oxygen, [ ]  Productive cough, [ ]  Coughing up blood, [x ] Asthma,  [ ]  Wheezing [ ]  COPD Musculoskeletal:  [ ]  Arthritis, [ ]   Low back pain, [ ]  Joint pain Hematologic: [ ]  Easy Bruising, [x ] Anemia; [ ]  Hepatitis Gastrointestinal: [ ]  Blood in stool, [ ]  Gastroesophageal Reflux/heartburn, Urinary: [x ] chronic Kidney disease, [ ]  on HD - [ ]  MWF or [ ]  TTHS, [ ]  Burning with urination, [ ]  Difficulty urinating Skin: [ ]  Rashes, [x ] Wounds Psychological: [ ]  Anxiety, [x ] Depression  Physical Examination Vitals:   11/16/16 1351 11/16/16 1351 11/16/16 1356 11/16/16 1400  BP:  (!) 183/62 (!) 154/54 (!) 151/51  Pulse: 71 74 73   Resp: 13 (!) 9 13   Temp:      TempSrc:      SpO2: 94% 95% 92%   Weight:      Height:       Body mass index is 30.11 kg/m.  General:  WDWN in NAD HENT: WNL Eyes: Pupils equal Pulmonary: normal non-labored breathing , without Rales, rhonchi,  wheezing Cardiac: RRR, without  Murmurs, rubs or gallops; No carotid bruits Abdomen: soft, NT, no masses Skin: no rashes, 1 x 1 cm dark eschar wound left heel;  no Gangrene , no cellulitis; no open wounds;   Vascular Exam/Pulses:Radial pulses intact B, right femoral, weak left femoral, right popliteal palpable, Doppler right and left high AT signal weak   Musculoskeletal: no muscle wasting or atrophy; no edema  Neurologic: A&O X 3; Appropriate Affect ;  SENSATION: normal; MOTOR FUNCTION: grossly intact Speech is fluent/normal   Significant Diagnostic Studies: CBC Lab Results  Component Value Date   WBC 8.0 11/16/2016   HGB 8.9 (L) 11/16/2016   HCT 26.7 (L) 11/16/2016   MCV 90.5 11/16/2016   PLT 168 11/16/2016    BMET    Component Value Date/Time   NA 133 (L) 11/16/2016 0932   K 4.4 11/16/2016 0932   CL 104 11/16/2016 0932   CO2 20 (L) 11/16/2016 0932   GLUCOSE 242 (H) 11/16/2016 0932   BUN 42 (H) 11/16/2016 0932   CREATININE 1.80 (H) 11/16/2016 0932   CALCIUM 8.3 (L) 11/16/2016 0932   GFRNONAA 29 (L) 11/16/2016 0932   GFRAA 34 (L) 11/16/2016 0932   Estimated Creatinine Clearance: 32.3 mL/min (A) (by C-G formula  based on SCr of 1.8 mg/dL (H)).  COAG Lab Results  Component Value Date   INR 1.05 11/16/2016   INR 0.9 12/10/2013  Non-Invasive Vascular Imaging:   ASSESSMENT/PLAN:  PAD with chronic left heel wound. Left femoral endarterectomy and left femoral to popliteal artery bypass.   Laurence Slate [facility] 11/16/2016 2:27 PM  Left heel wound not healing with conservative measures.  Severe PAD on agram. Agree with above.  Will plan for left femoral endarterectomy with left fem below knee pop bypass next Wednesday 11/23/16.  Risks benefits possible complications discussed including but not limited to bleeding infection myocardial events graft thrombosis possible limb loss.  She agrees to proceed.  Ruta Hinds, MD [facility] [place] Office: [phone] Pager: [phone]

## 2016-11-16 NOTE — Progress Notes (Addendum)
Site area: RFA Site Prior to Removal:  Level  1-small hematoma above site Pressure Applied For:20 min Manual:   yes Patient Status During Pull:  stable Post Pull Site:  Level 0 Post Pull Instructions Given:  yes Post Pull Pulses Present: doppler Dressing Applied: tegaderm  Bedrest begins @ 1400 till 1800 Comments: removed by Suella Broad

## 2016-11-16 NOTE — H&P (View-Only) (Signed)
Cardiology Office Note   Date:  11/15/2016   ID:  [name], DOB [birth year], MRN [id number]  PCP:  Nicholes Rough, PA-C  Cardiologist:   Dr. Stanford Breed  Chief Complaint  Patient presents with  . Follow-up    PVD/non healing ulcer on left foot       History of Present Illness: [name] is a 61 y.o. female who presents for a followup visit regarding peripheral arterial disease. She has known history of  coronary artery status post coronary bypass graft surgery in 2004. She is known to have moderate bilateral carotid stenosis.  She also has prolonged history of diabetes with advanced chronic kidney disease (creatinine around 3) and  diabetic neuropathy. 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  She has severe bilateral calf claudication worse on the left side after walking less than 50 feet. Previous angiography in June 2015 showed : 1. No significant aortoiliac disease.  2. 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 However, the profunda has significant ostial and proximal disease which likely impair collateral flow.  3. Diffuse 80% right SFA disease.  She was treated medically for claudication but she developed ulceration on the left heel in December which started as a fissure and has been expanding since then. She would develop similar ulceration on the right foot which is smaller. She has severe rest pain bilaterally. She was seen at the wound clinic at the wound at [facility] by Dr. Phylis Bougie. An ABI was done and it was less than 0.2 bilaterally.   Past Medical History:  Diagnosis Date  . Adenomatous polyp of colon   . Anemia   . Asthma   . Carpal tunnel syndrome   . Chronic renal insufficiency   . Coronary artery disease   . Depression   . Depression with anxiety   . Diabetes mellitus    type II  . Diverticulosis of colon (without mention of  hemorrhage)   . GERD (gastroesophageal reflux disease)   . Hyperlipidemia   . Hypertension   . Obesity   . PVD (peripheral vascular disease) ([place])   . Thyroid disease    hypo    Past Surgical History:  Procedure Laterality Date  . ABDOMINAL AORTAGRAM N/A 12/18/2013   Procedure: ABDOMINAL [name];  Surgeon: Wellington Hampshire, MD;  Location: [facility] CATH [facility];  Service: Cardiovascular;  Laterality: N/A;  . ABDOMINAL HYSTERECTOMY    . APPENDECTOMY    . CHOLECYSTECTOMY    . CORONARY ARTERY BYPASS GRAFT     x4  . KNEE SURGERY       Current Outpatient Prescriptions  Medication Sig Dispense Refill  . albuterol (PROVENTIL HFA;VENTOLIN HFA) 108 (90 BASE) MCG/ACT inhaler Inhale 2 puffs into the lungs every 6 (six) hours as needed for wheezing or shortness of breath.     . ALPRAZolam (XANAX) 1 MG tablet Take 1 mg by mouth daily as needed for anxiety.     Marland Kitchen amLODipine (NORVASC) 10 MG tablet Take 10 mg by mouth at bedtime.     Marland Kitchen aspirin EC 81 MG tablet Take 81 mg by mouth at bedtime.    Marland Kitchen atorvastatin (LIPITOR) 80 MG tablet Take 80 mg by mouth daily.      . B-D INS SYR ULTRAFINE 1CC/31G 31G X 5/16" 1 ML MISC USE NEW NEEDLE FOR MEAL TIME 3 TIMES A  DAY AND LONG ACTING INSULIN TWICE A DAY  11  . fenofibrate micronized (LOFIBRA) 200 MG capsule Take 200 mg by mouth at bedtime.     . ferrous sulfate 325 (65 FE) MG tablet Take 325 mg by mouth 3 (three) times daily.  3  . furosemide (LASIX) 40 MG tablet Take 40 mg by mouth 2 (two) times daily.     . insulin glargine (LANTUS) 100 UNIT/ML injection Inject 50-55 Units into the skin 2 (two) times daily as needed (per sliding scale based on blood sugar).     Marland Kitchen levothyroxine (SYNTHROID, LEVOTHROID) 175 MCG tablet Take 175 mcg by mouth daily before breakfast.     . Omega-3 Fatty Acids (FISH OIL) 1000 MG CAPS Take 1,000 mg by mouth 2 (two) times daily.    Marland Kitchen omeprazole (PRILOSEC) 40 MG capsule Take 40 mg by mouth daily.    Glory Rosebush VERIO test strip CHECK SUGAR  AT HOME 4-5 TIMES A DAY  11  . PARoxetine (PAXIL) 40 MG tablet Take 40 mg by mouth at bedtime.     . promethazine (PHENERGAN) 25 MG tablet Take 25 mg by mouth daily as needed for nausea or vomiting.   6  . Vitamin D, Ergocalciferol, (DRISDOL) 50000 UNITS CAPS capsule Take 50,000 Units by mouth once a week.  1  . cilostazol (PLETAL) 100 MG tablet Take 100 mg by mouth 2 (two) times daily.  2  . insulin aspart (NOVOLOG) 100 UNIT/ML injection Inject 5-15 Units into the skin 3 (three) times daily as needed for high blood sugar.    . metoprolol (LOPRESSOR) 100 MG tablet Take 150 mg by mouth 2 (two) times daily.    Marland Kitchen oxyCODONE (OXYCONTIN) 15 mg 12 hr tablet Take 15 mg by mouth every 12 (twelve) hours.    Marland Kitchen oxyCODONE-acetaminophen (PERCOCET) 10-325 MG tablet Take 1 tablet by mouth 3 (three) times daily as needed for pain.     Current Facility-Administered Medications  Medication Dose Route Frequency Provider Last Rate Last Dose  . 0.9 %  sodium chloride infusion  500 mL Intravenous Continuous Irene Shipper, MD        Allergies:   Patient has no known allergies.    Social History:  The patient  reports that she quit smoking about 14 years ago. She has never used smokeless tobacco. She reports that she does not drink alcohol or use drugs.   Family History:  The patient's family history includes Colon polyps in her daughter; Diabetes in her mother; Heart disease in her father.    ROS:  Please see the history of present illness.   Otherwise, review of systems are positive for none.   All other systems are reviewed and negative.    PHYSICAL EXAM: VS:  BP (!) 110/56   Pulse 65   Ht 5\' 3"  (1.6 m)   Wt 174 lb 9.6 oz (79.2 kg)   BMI 30.93 kg/m  , BMI Body mass index is 30.93 kg/m. GEN: Well nourished, well developed, in no acute distress  HEENT: normal  Neck: no JVD,  or masses. Bilateral carotid bruits. Cardiac: RRR; no rubs, or gallops,no edema . 2/6 systolic ejection murmur in the aortic  area. Respiratory:  clear to auscultation bilaterally, normal work of breathing GI: soft, nontender, nondistended, + BS MS: no deformity or atrophy  Skin: warm and dry, no rash Neuro:  Strength and sensation are intact Psych: euthymic mood, full affect Vascular: Femoral pulses are mildly diminished bilaterally. Distal  pulses are not palpable. There is a deep ulceration affecting the left heel and a small fissure affecting the right posterior foot.  EKG:  EKG is ordered today. EKG showed normal sinus rhythm with nonspecific ST and T wave changes.   Recent Labs: No results found for requested labs within last 8760 hours.    Lipid Panel No results found for: CHOL, TRIG, HDL, CHOLHDL, VLDL, LDLCALC, LDLDIRECT    Wt Readings from Last 3 Encounters:  11/15/16 174 lb 9.6 oz (79.2 kg)  08/12/16 169 lb 3.2 oz (76.7 kg)  07/15/16 175 lb (79.4 kg)         ASSESSMENT AND PLAN:  1.  Peripheral arterial disease with critical limb ischemia manifested by severe rest pain and ulceration worse on the left side. She is at high risk for limb loss and amputation with critically low ABI bilaterally. I recommend proceeding with urgent angiography and possible endovascular intervention. Given the long occlusion of the SFA on the left, she might require surgical revascularization. I discontinued cilostazol. I discussed the procedure in details as well as risk and benefits. Unfortunately, she has advanced chronic kidney disease and she is at high risk for contrast-induced nephropathy and the need for dialysis. This was discussed with her in details. I will plan on using CO2 initially and will hydrate her before and after.  2. Coronary artery disease: She has no chest pain. Stable exertional dyspnea.  3. Essential hypertension: Blood pressure is well controlled on current medications. I  4. Hyperlipidemia: Currently on atorvastatin and fenofibrate.  5. Bilateral carotid stenosis: Moderate bilateral  disease.   6. Cardiac murmur suggestive of aortic stenosis. If she requires surgical revascularization, she will require at least an echocardiogram.   Disposition:   FU with me in 1 months  Signed,  [name], MD  11/15/2016 5:36 PM    [place]

## 2016-11-16 NOTE — Discharge Instructions (Signed)
Angiogram, Care After °This sheet gives you information about how to care for yourself after your procedure. Your health care provider may also give you more specific instructions. If you have problems or questions, contact your health care provider. °What can I expect after the procedure? °After the procedure, it is common to have bruising and tenderness at the catheter insertion area. °Follow these instructions at home: °Insertion site care  °· Follow instructions from your health care provider about how to take care of your insertion site. Make sure you: °¨ Wash your hands with soap and water before you change your bandage (dressing). If soap and water are not available, use hand sanitizer. °¨ Change your dressing as told by your health care provider. °¨ Leave stitches (sutures), skin glue, or adhesive strips in place. These skin closures may need to stay in place for 2 weeks or longer. If adhesive strip edges start to loosen and curl up, you may trim the loose edges. Do not remove adhesive strips completely unless your health care provider tells you to do that. °· Do not take baths, swim, or use a hot tub until your health care provider approves. °· You may shower 24-48 hours after the procedure or as told by your health care provider. °¨ Gently wash the site with plain soap and water. °¨ Pat the area dry with a clean towel. °¨ Do not rub the site. This may cause bleeding. °· Do not apply powder or lotion to the site. Keep the site clean and dry. °· Check your insertion site every day for signs of infection. Check for: °¨ Redness, swelling, or pain. °¨ Fluid or blood. °¨ Warmth. °¨ Pus or a bad smell. °Activity  °· Rest as told by your health care provider, usually for 1-2 days. °· Do not lift anything that is heavier than 10 lbs. (4.5 kg) or as told by your health care provider. °· Do not drive for 24 hours if you were given a medicine to help you relax (sedative). °· Do not drive or use heavy machinery while  taking prescription pain medicine. °General instructions  °· Return to your normal activities as told by your health care provider, usually in about a week. Ask your health care provider what activities are safe for you. °· If the catheter site starts bleeding, lie flat and put pressure on the site. If the bleeding does not stop, get help right away. This is a medical emergency. °· Drink enough fluid to keep your urine clear or pale yellow. This helps flush the contrast dye from your body. °· Take over-the-counter and prescription medicines only as told by your health care provider. °· Keep all follow-up visits as told by your health care provider. This is important. °Contact a health care provider if: °· You have a fever or chills. °· You have redness, swelling, or pain around your insertion site. °· You have fluid or blood coming from your insertion site. °· The insertion site feels warm to the touch. °· You have pus or a bad smell coming from your insertion site. °· You have bruising around the insertion site. °· You notice blood collecting in the tissue around the catheter site (hematoma). The hematoma may be painful to the touch. °Get help right away if: °· You have severe pain at the catheter insertion area. °· The catheter insertion area swells very fast. °· The catheter insertion area is bleeding, and the bleeding does not stop when you hold steady pressure on   the area. °· The area near or just beyond the catheter insertion site becomes pale, cool, tingly, or numb. °These symptoms may represent a serious problem that is an emergency. Do not wait to see if the symptoms will go away. Get medical help right away. Call your local emergency services (911 in the [place] Do not drive yourself to the hospital. °Summary °· After the procedure, it is common to have bruising and tenderness at the catheter insertion area. °· After the procedure, it is important to rest and drink plenty of fluids. °· Do not take baths,  swim, or use a hot tub until your health care provider says it is okay to do so. You may shower 24-48 hours after the procedure or as told by your health care provider. °· If the catheter site starts bleeding, lie flat and put pressure on the site. If the bleeding does not stop, get help right away. This is a medical emergency. °This information is not intended to replace advice given to you by your health care provider. Make sure you discuss any questions you have with your health care provider. °Document Released: 01/13/2005 Document Revised: 06/01/2016 Document Reviewed: 06/01/2016 °Elsevier Interactive Patient Education © 2017 [employer] ° °

## 2016-11-17 ENCOUNTER — Encounter (HOSPITAL_COMMUNITY): Payer: Self-pay | Admitting: Cardiovascular Disease

## 2016-11-17 ENCOUNTER — Other Ambulatory Visit: Payer: Self-pay

## 2016-11-17 MED FILL — Morphine Sulfate Inj 4 MG/ML: INTRAMUSCULAR | Qty: 0.5 | Status: AC

## 2016-11-17 MED FILL — Morphine Sulfate Inj 4 MG/ML: INTRAMUSCULAR | Qty: 1 | Status: CN

## 2016-11-18 ENCOUNTER — Other Ambulatory Visit: Payer: Medicare Other | Admitting: *Deleted

## 2016-11-18 ENCOUNTER — Other Ambulatory Visit: Payer: Self-pay

## 2016-11-18 DIAGNOSIS — I70229 Atherosclerosis of native arteries of extremities with rest pain, unspecified extremity: Secondary | ICD-10-CM

## 2016-11-18 DIAGNOSIS — I998 Other disorder of circulatory system: Secondary | ICD-10-CM

## 2016-11-18 LAB — BASIC METABOLIC PANEL
BUN / CREAT RATIO: 24 (ref 12–28)
BUN: 38 mg/dL — AB (ref 8–27)
CALCIUM: 8.3 mg/dL — AB (ref 8.7–10.3)
CHLORIDE: 102 mmol/L (ref 96–106)
CO2: 18 mmol/L (ref 18–29)
Creatinine, Ser: 1.6 mg/dL — ABNORMAL HIGH (ref 0.57–1.00)
GFR calc non Af Amer: 35 mL/min/{1.73_m2} — ABNORMAL LOW (ref >59)
GFR, EST AFRICAN AMERICAN: 40 mL/min/{1.73_m2} — AB (ref >59)
GLUCOSE: 272 mg/dL — AB (ref 65–99)
POTASSIUM: 5.4 mmol/L — AB (ref 3.5–5.2)
Sodium: 135 mmol/L (ref 134–144)

## 2016-11-21 ENCOUNTER — Ambulatory Visit (HOSPITAL_COMMUNITY): Payer: Medicare Other | Attending: Cardiology

## 2016-11-21 ENCOUNTER — Other Ambulatory Visit: Payer: Self-pay

## 2016-11-21 VITALS — BP 157/102

## 2016-11-21 DIAGNOSIS — R011 Cardiac murmur, unspecified: Secondary | ICD-10-CM | POA: Diagnosis not present

## 2016-11-21 LAB — ECHOCARDIOGRAM COMPLETE

## 2016-11-21 MED ORDER — PERFLUTREN LIPID MICROSPHERE
1.0000 mL | INTRAVENOUS | Status: AC | PRN
  Administered 2016-11-21: 1 mL via INTRAVENOUS

## 2016-11-21 NOTE — Pre-Procedure Instructions (Signed)
[name]  11/21/2016      [id number] [place], [place] - [address] [facility] [place] [address] [place] [address] Phone: [phone] Fax: [phone]    Your procedure is scheduled on May 16.  Report to [place] at [phone].M.  Call this number if you have problems the morning of surgery:  [phone]   Remember:  Do not eat food or drink liquids after midnight.  Take these medicines the morning of surgery with A SIP OF WATER albuterol inhaler if needed- bring your inhalers with you on the day of surgery, xanax if needed,  Levothyroxine (Synthroid), Metoprolol (lopressor), Omeprazole (Prilosec), Oxycodone (oxycontin), oxycodone (percocet)     How to Manage Your Diabetes Before and After Surgery  Why is it important to control my blood sugar before and after surgery? . Improving blood sugar levels before and after surgery helps healing and can limit problems. . A way of improving blood sugar control is eating a healthy diet by: o  Eating less sugar and carbohydrates o  Increasing activity/exercise o  Talking with your doctor about reaching your blood sugar goals . High blood sugars (greater than 180 mg/dL) can raise your risk of infections and slow your recovery, so you will need to focus on controlling your diabetes during the weeks before surgery. . Make sure that the doctor who takes care of your diabetes knows about your planned surgery including the date and location.  How do I manage my blood sugar before surgery? . Check your blood sugar at least 4 times a day, starting 2 days before surgery, to make sure that the level is not too high or low. o Check your blood sugar the morning of your surgery when you wake up and every 2 hours until you get to the Short Stay unit. . If your blood sugar is less than 70 mg/dL, you will need to treat for low blood sugar: o Do not take insulin. o Treat a low blood sugar  (less than 70 mg/dL) with  cup of clear juice (cranberry or apple), 4 glucose tablets, OR glucose gel. o Recheck blood sugar in 15 minutes after treatment (to make sure it is greater than 70 mg/dL). If your blood sugar is not greater than 70 mg/dL on recheck, call [phone] for further instructions. . Report your blood sugar to the short stay nurse when you get to Short Stay.  . If you are admitted to the hospital after surgery: o Your blood sugar will be checked by the staff and you will probably be given insulin after surgery (instead of oral diabetes medicines) to make sure you have good blood sugar levels. o The goal for blood sugar control after surgery is 80-180 mg/dL.     WHAT DO I DO ABOUT MY DIABETES MEDICATION?   . THE NIGHT BEFORE SURGERY, take ___________ units of ___________insulin.       Marland Kitchen HE MORNING OF SURGERY, take _____________ units of __________insulin.  . The day of surgery, do not take other diabetes injectables, including Byetta (exenatide), Bydureon (exenatide ER), Victoza (liraglutide), or Trulicity (dulaglutide).  . If your CBG is greater than 220 mg/dL, you may take  of your sliding scale (correction) dose of insulin.  Other Instructions:          Patient Signature:  Date:   Nurse Signature:  Date:   Reviewed and Endorsed by [facility] Patient Education Committee, August  2015  Do not wear jewelry, make-up or nail polish.  Do not wear lotions, powders, or perfumes, or deoderant.  Do not shave 48 hours prior to surgery.  Men may shave face and neck.  Do not bring valuables to the hospital.  [facility] is not responsible for any belongings or valuables.  Contacts, dentures or bridgework may not be worn into surgery.  Leave your suitcase in the car.  After surgery it may be brought to your room.  For patients admitted to the hospital, discharge time will be determined by your treatment team.  Patients discharged the day of surgery will not be  allowed to drive home.    Special instructions:  [place] - Preparing for Surgery  Before surgery, you can play an important role.  Because skin is not sterile, your skin needs to be as free of germs as possible.  You can reduce the number of germs on you skin by washing with CHG (chlorahexidine gluconate) soap before surgery.  CHG is an antiseptic cleaner which kills germs and bonds with the skin to continue killing germs even after washing.  Please DO NOT use if you have an allergy to CHG or antibacterial soaps.  If your skin becomes reddened/irritated stop using the CHG and inform your nurse when you arrive at Short Stay.  Do not shave (including legs and underarms) for at least 48 hours prior to the first CHG shower.  You may shave your face.  Please follow these instructions carefully:   1.  Shower with CHG Soap the night before surgery and the    morning of Surgery.  2.  If you choose to wash your hair, wash your hair first as usual with your   normal shampoo.  3.  After you shampoo, rinse your hair and body thoroughly to remove the  Shampoo.  4.  Use CHG as you would any other liquid soap.  You can apply chg directly  to the skin and wash gently with scrungie or a clean washcloth.  5.  Apply the CHG Soap to your body ONLY FROM THE NECK DOWN.  Do not use on open wounds or open sores.  Avoid contact with your eyes,   ears, mouth and genitals (private parts).  Wash genitals (private parts)  with your normal soap.  6.  Wash thoroughly, paying special attention to the area where your surgery will be performed.  7.  Thoroughly rinse your body with warm water from the neck down.  8.  DO NOT shower/wash with your normal soap after using and rinsing off  the CHG Soap.  9.  Pat yourself dry with a clean towel.            10.  Wear clean pajamas.            11.  Place clean sheets on your bed the night of your first shower and do not sleep with pets.  Day of Surgery  Do not apply any  lotions/deoderants the morning of surgery.  Please wear clean clothes to the hospital/surgery center.     Please read over the following fact sheets that you were given. Pain Booklet, Coughing and Deep Breathing, MRSA Information and Surgical Site Infection Prevention

## 2016-11-22 ENCOUNTER — Ambulatory Visit: Payer: Medicare Other | Admitting: Cardiovascular Disease

## 2016-11-22 ENCOUNTER — Encounter (HOSPITAL_COMMUNITY)
Admission: RE | Admit: 2016-11-22 | Discharge: 2016-11-22 | Disposition: A | Discharge status: Home / Self Care | Payer: Medicare Other | Source: Ambulatory Visit | Attending: Vascular Surgery | Admitting: Vascular Surgery

## 2016-11-22 ENCOUNTER — Encounter (HOSPITAL_COMMUNITY): Payer: Self-pay

## 2016-11-22 HISTORY — DX: Personal history of other diseases of the digestive system: Z87.19

## 2016-11-22 HISTORY — DX: Hypothyroidism, unspecified: E03.9

## 2016-11-22 HISTORY — DX: Unspecified osteoarthritis, unspecified site: M19.90

## 2016-11-22 HISTORY — DX: Cardiac murmur, unspecified: R01.1

## 2016-11-22 LAB — COMPREHENSIVE METABOLIC PANEL
ALBUMIN: 2.9 g/dL — AB (ref 3.5–5.0)
ALK PHOS: 224 U/L — AB (ref 38–126)
ALT: 44 U/L (ref 14–54)
ANION GAP: 11 (ref 5–15)
AST: 52 U/L — ABNORMAL HIGH (ref 15–41)
BILIRUBIN TOTAL: 0.6 mg/dL (ref 0.3–1.2)
BUN: 34 mg/dL — AB (ref 6–20)
CALCIUM: 8.9 mg/dL (ref 8.9–10.3)
CO2: 21 mmol/L — ABNORMAL LOW (ref 22–32)
Chloride: 104 mmol/L (ref 101–111)
Creatinine, Ser: 1.95 mg/dL — ABNORMAL HIGH (ref 0.44–1.00)
GFR calc Af Amer: 31 mL/min — ABNORMAL LOW (ref >60)
GFR calc non Af Amer: 27 mL/min — ABNORMAL LOW (ref >60)
Glucose, Bld: 138 mg/dL — ABNORMAL HIGH (ref 65–99)
Potassium: 4.7 mmol/L (ref 3.5–5.1)
Sodium: 136 mmol/L (ref 135–145)
TOTAL PROTEIN: 6.4 g/dL — AB (ref 6.5–8.1)

## 2016-11-22 LAB — CBC
HEMATOCRIT: 31.3 % — AB (ref 36.0–46.0)
HEMOGLOBIN: 9.9 g/dL — AB (ref 12.0–15.0)
MCH: 29.6 pg (ref 26.0–34.0)
MCHC: 31.6 g/dL (ref 30.0–36.0)
MCV: 93.7 fL (ref 78.0–100.0)
Platelets: 297 10*3/uL (ref 150–400)
RBC: 3.34 MIL/uL — ABNORMAL LOW (ref 3.87–5.11)
RDW: 14.2 % (ref 11.5–15.5)
WBC: 11.5 10*3/uL — ABNORMAL HIGH (ref 4.0–10.5)

## 2016-11-22 LAB — URINALYSIS, ROUTINE W REFLEX MICROSCOPIC
Bacteria, UA: NONE SEEN
Bilirubin Urine: NEGATIVE
Glucose, UA: NEGATIVE mg/dL
Ketones, ur: NEGATIVE mg/dL
Leukocytes, UA: NEGATIVE
Nitrite: NEGATIVE
PROTEIN: 30 mg/dL — AB
SPECIFIC GRAVITY, URINE: 1.005 (ref 1.005–1.030)
pH: 5 (ref 5.0–8.0)

## 2016-11-22 LAB — PREPARE RBC (CROSSMATCH)

## 2016-11-22 LAB — PROTIME-INR
INR: 1.11
Prothrombin Time: 14.3 seconds (ref 11.4–15.2)

## 2016-11-22 LAB — GLUCOSE, CAPILLARY
GLUCOSE-CAPILLARY: 63 mg/dL — AB (ref 65–99)
GLUCOSE-CAPILLARY: 128 mg/dL — AB (ref 65–99)

## 2016-11-22 LAB — SURGICAL PCR SCREEN
MRSA, PCR: NEGATIVE
Staphylococcus aureus: NEGATIVE

## 2016-11-22 LAB — ABO/RH: ABO/RH(D): A POS

## 2016-11-22 LAB — APTT: APTT: 34 s (ref 24–36)

## 2016-11-22 NOTE — Pre-Procedure Instructions (Addendum)
[name]  11/22/2016      [id number] [place],  - [address] [facility] [place] [address] [place] [address] Phone: [phone] Fax: [phone]    Your procedure is scheduled on May 16.  Report to [place] at [phone].M.  Call this number if you have problems the morning of surgery:  [phone]   Remember:  Do not eat food or drink liquids after midnight.  Take these medicines the morning of surgery with A SIP OF WATER albuterol inhaler if needed- bring your inhalers with you on the day of surgery, xanax if needed,  Levothyroxine (Synthroid), Metoprolol (lopressor), Omeprazole (Prilosec), Oxycodone (oxycontin), oxycodone (percocet)  STOP all herbel meds, nsaids (aleve,naproxen,advil,ibuprofen)   prior to surgery starting today including all vitamins/supplements  Aspirin ,pletal per dr     How to Manage Your Diabetes Before and After Surgery  Why is it important to control my blood sugar before and after surgery? . Improving blood sugar levels before and after surgery helps healing and can limit problems. . A way of improving blood sugar control is eating a healthy diet by: o  Eating less sugar and carbohydrates o  Increasing activity/exercise o  Talking with your doctor about reaching your blood sugar goals . High blood sugars (greater than 180 mg/dL) can raise your risk of infections and slow your recovery, so you will need to focus on controlling your diabetes during the weeks before surgery. . Make sure that the doctor who takes care of your diabetes knows about your planned surgery including the date and location.  How do I manage my blood sugar before surgery? . Check your blood sugar at least 4 times a day, starting 2 days before surgery, to make sure that the level is not too high or low. o Check your blood sugar the morning of your surgery when you wake up and every 2 hours until you get to  the Short Stay unit. . If your blood sugar is less than 70 mg/dL, you will need to treat for low blood sugar: o Do not take insulin. o Treat a low blood sugar (less than 70 mg/dL) with  cup of clear juice (cranberry or apple), 4 glucose tablets, OR glucose gel. o Recheck blood sugar in 15 minutes after treatment (to make sure it is greater than 70 mg/dL). If your blood sugar is not greater than 70 mg/dL on recheck, call [phone] for further instructions. . Report your blood sugar to the short stay nurse when you get to Short Stay.  . If you are admitted to the hospital after surgery: o Your blood sugar will be checked by the staff and you will probably be given insulin after surgery (instead of oral diabetes medicines) to make sure you have good blood sugar levels. o The goal for blood sugar control after surgery is 80-180 mg/dL.     WHAT DO I DO ABOUT MY DIABETES MEDICATION?   . THE NIGHT BEFORE SURGERY, take 1/2 of reg dose of lantus insulin. If needed       . THE MORNING OF SURGERY, take 1/2 of reg dose of lantus insulin. If needed   Do not wear jewelry, make-up or nail polish.  Do not wear lotions, powders, or perfumes, or deoderant.  Do not shave 48 hours prior to surgery.  Men may shave face and neck.  Do not bring valuables to the hospital.  [facility]  is not responsible for any belongings or valuables.  Contacts, dentures or bridgework may not be worn into surgery.  Leave your suitcase in the car.  After surgery it may be brought to your room.  For patients admitted to the hospital, discharge time will be determined by your treatment team.  Patients discharged the day of surgery will not be allowed to drive home.    Special instructions:  [name] - Preparing for Surgery  Before surgery, you can play an important role.  Because skin is not sterile, your skin needs to be as free of germs as possible.  You can reduce the number of germs on you skin by washing with  CHG (chlorahexidine gluconate) soap before surgery.  CHG is an antiseptic cleaner which kills germs and bonds with the skin to continue killing germs even after washing.  Please DO NOT use if you have an allergy to CHG or antibacterial soaps.  If your skin becomes reddened/irritated stop using the CHG and inform your nurse when you arrive at Short Stay.  Do not shave (including legs and underarms) for at least 48 hours prior to the first CHG shower.  You may shave your face.  Please follow these instructions carefully:   1.  Shower with CHG Soap the night before surgery and the    morning of Surgery.  2.  If you choose to wash your hair, wash your hair first as usual with your   normal shampoo.  3.  After you shampoo, rinse your hair and body thoroughly to remove the  Shampoo.  4.  Use CHG as you would any other liquid soap.  You can apply chg directly  to the skin and wash gently with scrungie or a clean washcloth.  5.  Apply the CHG Soap to your body ONLY FROM THE NECK DOWN.  Do not use on open wounds or open sores.  Avoid contact with your eyes,   ears, mouth and genitals (private parts).  Wash genitals (private parts)  with your normal soap.  6.  Wash thoroughly, paying special attention to the area where your surgery will be performed.  7.  Thoroughly rinse your body with warm water from the neck down.  8.  DO NOT shower/wash with your normal soap after using and rinsing off  the CHG Soap.  9.  Pat yourself dry with a clean towel.            10.  Wear clean pajamas.            11.  Place clean sheets on your bed the night of your first shower and do not sleep with pets.  Day of Surgery  Do not apply any lotions/deoderants the morning of surgery.  Please wear clean clothes to the hospital/surgery center.  Please read over the  fact sheets that you were given.

## 2016-11-22 NOTE — Progress Notes (Signed)
Anesthesia Chart Review:  Pt is a 61 year old female scheduled for L femoral endarterectomy, L femoral-below knee popliteal bypass graft on 11/23/2016 with Ruta Hinds, MD  - PCP is Nicholes Rough, PA (notes in care everywhere) - Cardiologist is Kirk Ruths, MD.   - Cardiologist [name], MD follows pt's PVD and cleared pt for surgery.   PMH includes:  CAD (s/p CABG 2004), HTN, DM, hyperlipidemia, PVD, asthma, anemia, chronic renal insufficiency (stage 4), hypothyroidism, GERD. Former smoker. BMI 30  Medications include: albuterol, amlodipine, ASA 81mg , lipitor, pletal, fenofibrate, iron, lasix, lantus, levothyroxine, metoprolol, prilosec. Last dose pletal 11/16/16.   Preoperative labs reviewed.   - Cr 1.95, BUN 34.  Prior Cr results over last year range 1.51-1.97.  - glucose 138. HbA1c pending, was 9.4 on 10/03/16 (care everywhere) - H/H 9.9/31.3. T&S in process and Dr. Oneida Alar has ordered prepare RBC.   EKG 06/07/16: NSR. Nonspecific ST and T wave abnormality  Echo 11/21/16:  - Left ventricle: The cavity size was normal. Wall thickness was normal. Systolic function was normal. The estimated ejection fraction was in the range of 55% to 60%. Wall motion was normal; there were no regional wall motion abnormalities. Doppler parameters are consistent with both elevated ventricular end-diastolic filling pressure and elevated left atrial filling pressure. - Mitral valve: Calcified annulus. - Left atrium: The atrium was mildly dilated. - Atrial septum: No defect or patent foramen ovale was identified. - Pulmonary arteries: PA peak pressure: 46 mm Hg (S).  Carotid duplex 07/01/16:  - Heterogeneous plaque, bilaterally. - Stable 40-59% RICA stenosis. - Stable 23-55% LICA stenosis. - >50% bilateral ECA stenosis. - Normal subclavian arteries, bilaterally. - Patent vertebral arteries with antegrade flow.  Nuclear stress test 08/12/14: Normal stress nuclear study. LV Wall Motion:  NL LV  Function; NL Wall Motion  If no changes, I anticipate pt can proceed with surgery as scheduled.   Willeen Cass, FNP-BC [facility] Stay Surgical Center/Anesthesiology Phone: [phone] 11/22/2016 12:34 PM

## 2016-11-22 NOTE — Anesthesia Preprocedure Evaluation (Addendum)
Anesthesia Evaluation  Patient identified by MRN, date of birth, ID band Patient awake    Reviewed: Allergy & Precautions, H&P , NPO status , Patient's Chart, lab work & pertinent test results  Airway Mallampati: II  TM Distance: >3 FB Neck ROM: Full    Dental no notable dental hx. (+) Edentulous Upper, Edentulous Lower, Dental Advisory Given   Pulmonary asthma , former smoker,    Pulmonary exam normal breath sounds clear to auscultation       Cardiovascular Exercise Tolerance: Good hypertension, Pt. on medications and Pt. on home beta blockers + CAD, + CABG and + Peripheral Vascular Disease   Rhythm:Regular Rate:Normal     Neuro/Psych Depression negative neurological ROS     GI/Hepatic Neg liver ROS, GERD  Medicated and Controlled,  Endo/Other  diabetes, Insulin DependentHypothyroidism   Renal/GU Renal InsufficiencyRenal disease  negative genitourinary   Musculoskeletal  (+) Arthritis , Osteoarthritis,    Abdominal   Peds  Hematology negative hematology ROS (+)   Anesthesia Other Findings   Reproductive/Obstetrics negative OB ROS                            Anesthesia Physical Anesthesia Plan  ASA: III  Anesthesia Plan: General   Post-op Pain Management:    Induction: Intravenous  Airway Management Planned: Oral ETT  Additional Equipment: Arterial line  Intra-op Plan:   Post-operative Plan: Extubation in OR  Informed Consent: I have reviewed the patients History and Physical, chart, labs and discussed the procedure including the risks, benefits and alternatives for the proposed anesthesia with the patient or authorized representative who has indicated his/her understanding and acceptance.   Dental advisory given  Plan Discussed with: CRNA  Anesthesia Plan Comments:         Anesthesia Quick Evaluation

## 2016-11-23 ENCOUNTER — Encounter (HOSPITAL_COMMUNITY): Payer: Self-pay

## 2016-11-23 ENCOUNTER — Encounter (HOSPITAL_COMMUNITY)
Admission: RE | Disposition: A | Discharge status: Home Health | Payer: Self-pay | Source: Ambulatory Visit | Attending: Vascular Surgery

## 2016-11-23 ENCOUNTER — Inpatient Hospital Stay (HOSPITAL_COMMUNITY)
Admission: RE | Admit: 2016-11-23 | Discharge: 2016-11-28 | DRG: 271 | Disposition: A | Discharge status: Home Health | Payer: Medicare Other | Source: Ambulatory Visit | Attending: Vascular Surgery | Admitting: Vascular Surgery

## 2016-11-23 ENCOUNTER — Inpatient Hospital Stay (HOSPITAL_COMMUNITY): Payer: Medicare Other | Admitting: Anesthesiology

## 2016-11-23 ENCOUNTER — Inpatient Hospital Stay (HOSPITAL_COMMUNITY): Payer: Medicare Other | Admitting: Emergency Medicine

## 2016-11-23 DIAGNOSIS — Z7982 Long term (current) use of aspirin: Secondary | ICD-10-CM | POA: Diagnosis not present

## 2016-11-23 DIAGNOSIS — E785 Hyperlipidemia, unspecified: Secondary | ICD-10-CM | POA: Diagnosis present

## 2016-11-23 DIAGNOSIS — Z794 Long term (current) use of insulin: Secondary | ICD-10-CM | POA: Diagnosis not present

## 2016-11-23 DIAGNOSIS — I70221 Atherosclerosis of native arteries of extremities with rest pain, right leg: Secondary | ICD-10-CM | POA: Diagnosis present

## 2016-11-23 DIAGNOSIS — Y92230 Patient room in hospital as the place of occurrence of the external cause: Secondary | ICD-10-CM | POA: Diagnosis not present

## 2016-11-23 DIAGNOSIS — E1151 Type 2 diabetes mellitus with diabetic peripheral angiopathy without gangrene: Secondary | ICD-10-CM | POA: Diagnosis present

## 2016-11-23 DIAGNOSIS — N184 Chronic kidney disease, stage 4 (severe): Secondary | ICD-10-CM | POA: Diagnosis present

## 2016-11-23 DIAGNOSIS — Z95828 Presence of other vascular implants and grafts: Secondary | ICD-10-CM | POA: Diagnosis not present

## 2016-11-23 DIAGNOSIS — E11621 Type 2 diabetes mellitus with foot ulcer: Secondary | ICD-10-CM | POA: Diagnosis present

## 2016-11-23 DIAGNOSIS — I739 Peripheral vascular disease, unspecified: Secondary | ICD-10-CM | POA: Diagnosis present

## 2016-11-23 DIAGNOSIS — I6529 Occlusion and stenosis of unspecified carotid artery: Secondary | ICD-10-CM | POA: Diagnosis present

## 2016-11-23 DIAGNOSIS — D62 Acute posthemorrhagic anemia: Secondary | ICD-10-CM | POA: Diagnosis not present

## 2016-11-23 DIAGNOSIS — F329 Major depressive disorder, single episode, unspecified: Secondary | ICD-10-CM | POA: Diagnosis present

## 2016-11-23 DIAGNOSIS — T8131XA Disruption of external operation (surgical) wound, not elsewhere classified, initial encounter: Secondary | ICD-10-CM | POA: Diagnosis not present

## 2016-11-23 DIAGNOSIS — Y832 Surgical operation with anastomosis, bypass or graft as the cause of abnormal reaction of the patient, or of later complication, without mention of misadventure at the time of the procedure: Secondary | ICD-10-CM | POA: Diagnosis not present

## 2016-11-23 DIAGNOSIS — I70244 Atherosclerosis of native arteries of left leg with ulceration of heel and midfoot: Principal | ICD-10-CM | POA: Diagnosis present

## 2016-11-23 DIAGNOSIS — Z951 Presence of aortocoronary bypass graft: Secondary | ICD-10-CM | POA: Diagnosis not present

## 2016-11-23 DIAGNOSIS — I2510 Atherosclerotic heart disease of native coronary artery without angina pectoris: Secondary | ICD-10-CM | POA: Diagnosis present

## 2016-11-23 DIAGNOSIS — E1122 Type 2 diabetes mellitus with diabetic chronic kidney disease: Secondary | ICD-10-CM | POA: Diagnosis present

## 2016-11-23 DIAGNOSIS — L97429 Non-pressure chronic ulcer of left heel and midfoot with unspecified severity: Secondary | ICD-10-CM | POA: Diagnosis present

## 2016-11-23 DIAGNOSIS — E11649 Type 2 diabetes mellitus with hypoglycemia without coma: Secondary | ICD-10-CM | POA: Diagnosis not present

## 2016-11-23 DIAGNOSIS — I129 Hypertensive chronic kidney disease with stage 1 through stage 4 chronic kidney disease, or unspecified chronic kidney disease: Secondary | ICD-10-CM | POA: Diagnosis present

## 2016-11-23 DIAGNOSIS — I7092 Chronic total occlusion of artery of the extremities: Secondary | ICD-10-CM | POA: Diagnosis present

## 2016-11-23 DIAGNOSIS — J45909 Unspecified asthma, uncomplicated: Secondary | ICD-10-CM | POA: Diagnosis present

## 2016-11-23 DIAGNOSIS — L8990 Pressure ulcer of unspecified site, unspecified stage: Secondary | ICD-10-CM | POA: Insufficient documentation

## 2016-11-23 DIAGNOSIS — E039 Hypothyroidism, unspecified: Secondary | ICD-10-CM | POA: Diagnosis present

## 2016-11-23 DIAGNOSIS — Z87891 Personal history of nicotine dependence: Secondary | ICD-10-CM | POA: Diagnosis not present

## 2016-11-23 DIAGNOSIS — K219 Gastro-esophageal reflux disease without esophagitis: Secondary | ICD-10-CM | POA: Diagnosis present

## 2016-11-23 HISTORY — PX: FEMORAL-POPLITEAL BYPASS GRAFT: SHX937

## 2016-11-23 HISTORY — PX: ENDARTERECTOMY FEMORAL: SHX5804

## 2016-11-23 LAB — GLUCOSE, CAPILLARY
GLUCOSE-CAPILLARY: 96 mg/dL (ref 65–99)
GLUCOSE-CAPILLARY: 115 mg/dL — AB (ref 65–99)
Glucose-Capillary: 114 mg/dL — ABNORMAL HIGH (ref 65–99)

## 2016-11-23 LAB — CREATININE, SERUM
Creatinine, Ser: 1.93 mg/dL — ABNORMAL HIGH (ref 0.44–1.00)
GFR, EST AFRICAN AMERICAN: 31 mL/min — AB (ref >60)
GFR, EST NON AFRICAN AMERICAN: 27 mL/min — AB (ref >60)

## 2016-11-23 LAB — CBC
HCT: 23.5 % — ABNORMAL LOW (ref 36.0–46.0)
Hemoglobin: 7.7 g/dL — ABNORMAL LOW (ref 12.0–15.0)
MCH: 30.9 pg (ref 26.0–34.0)
MCHC: 32.8 g/dL (ref 30.0–36.0)
MCV: 94.4 fL (ref 78.0–100.0)
PLATELETS: 204 10*3/uL (ref 150–400)
RBC: 2.49 MIL/uL — AB (ref 3.87–5.11)
RDW: 14.7 % (ref 11.5–15.5)
WBC: 14.8 10*3/uL — AB (ref 4.0–10.5)

## 2016-11-23 LAB — HEMOGLOBIN A1C
HEMOGLOBIN A1C: 7.2 % — AB (ref 4.8–5.6)
Mean Plasma Glucose: 160 mg/dL

## 2016-11-23 SURGERY — ENDARTERECTOMY, FEMORAL
Anesthesia: General | Site: Leg Lower | Laterality: Left

## 2016-11-23 MED ORDER — HEPARIN SODIUM (PORCINE) 1000 UNIT/ML IJ SOLN
INTRAMUSCULAR | Status: DC | PRN
  Administered 2016-11-23 (×2): 8000 [IU] via INTRAVENOUS
  Administered 2016-11-23: 5000 [IU] via INTRAVENOUS

## 2016-11-23 MED ORDER — ALBUTEROL SULFATE (2.5 MG/3ML) 0.083% IN NEBU
2.5000 mg | INHALATION_SOLUTION | Freq: Once | RESPIRATORY_TRACT | Status: AC
  Administered 2016-11-23: 2.5 mg via RESPIRATORY_TRACT

## 2016-11-23 MED ORDER — PHENOL 1.4 % MT LIQD: 1.0000 | OROMUCOSAL | Status: DC | PRN

## 2016-11-23 MED ORDER — FENTANYL CITRATE (PF) 250 MCG/5ML IJ SOLN
INTRAMUSCULAR | Status: AC
  Filled 2016-11-23: qty 5

## 2016-11-23 MED ORDER — PROPOFOL 10 MG/ML IV BOLUS
INTRAVENOUS | Status: DC | PRN
  Administered 2016-11-23: 110 mg via INTRAVENOUS

## 2016-11-23 MED ORDER — METOPROLOL TARTRATE 50 MG PO TABS
50.0000 mg | ORAL_TABLET | Freq: Two times a day (BID) | ORAL | Status: DC
  Administered 2016-11-23 – 2016-11-28 (×10): 50 mg via ORAL
  Filled 2016-11-23 (×11): qty 1

## 2016-11-23 MED ORDER — NOREPINEPHRINE BITARTRATE 1 MG/ML IV SOLN
0.0000 ug/min | INTRAVENOUS | Status: DC
  Filled 2016-11-23: qty 16

## 2016-11-23 MED ORDER — CILOSTAZOL 100 MG PO TABS
100.0000 mg | ORAL_TABLET | Freq: Two times a day (BID) | ORAL | Status: DC
  Administered 2016-11-23 – 2016-11-28 (×9): 100 mg via ORAL
  Filled 2016-11-23 (×11): qty 1

## 2016-11-23 MED ORDER — ONDANSETRON HCL 4 MG/2ML IJ SOLN: 4.0000 mg | Freq: Four times a day (QID) | INTRAMUSCULAR | Status: DC | PRN

## 2016-11-23 MED ORDER — LABETALOL HCL 5 MG/ML IV SOLN: 10.0000 mg | INTRAVENOUS | Status: DC | PRN

## 2016-11-23 MED ORDER — BISACODYL 10 MG RE SUPP: 10.0000 mg | Freq: Every day | RECTAL | Status: DC | PRN

## 2016-11-23 MED ORDER — PROPOFOL 10 MG/ML IV BOLUS
INTRAVENOUS | Status: AC
  Filled 2016-11-23: qty 20

## 2016-11-23 MED ORDER — CHLORHEXIDINE GLUCONATE 4 % EX LIQD: 60.0000 mL | Freq: Once | CUTANEOUS | Status: DC

## 2016-11-23 MED ORDER — GUAIFENESIN-DM 100-10 MG/5ML PO SYRP: 15.0000 mL | ORAL_SOLUTION | ORAL | Status: DC | PRN

## 2016-11-23 MED ORDER — METOPROLOL TARTRATE 5 MG/5ML IV SOLN: 2.0000 mg | INTRAVENOUS | Status: DC | PRN

## 2016-11-23 MED ORDER — HEPARIN SODIUM (PORCINE) 5000 UNIT/ML IJ SOLN
INTRAMUSCULAR | Status: DC | PRN
  Administered 2016-11-23: 10:00:00

## 2016-11-23 MED ORDER — FUROSEMIDE 40 MG PO TABS
40.0000 mg | ORAL_TABLET | Freq: Two times a day (BID) | ORAL | Status: DC
  Administered 2016-11-23 – 2016-11-28 (×10): 40 mg via ORAL
  Filled 2016-11-23 (×10): qty 1

## 2016-11-23 MED ORDER — HYDROMORPHONE HCL 1 MG/ML IJ SOLN
INTRAMUSCULAR | Status: AC
  Filled 2016-11-23: qty 0.5

## 2016-11-23 MED ORDER — SUGAMMADEX SODIUM 200 MG/2ML IV SOLN
INTRAVENOUS | Status: AC
  Filled 2016-11-23: qty 2

## 2016-11-23 MED ORDER — LEVOTHYROXINE SODIUM 75 MCG PO TABS
175.0000 ug | ORAL_TABLET | Freq: Every day | ORAL | Status: DC
  Administered 2016-11-24 – 2016-11-28 (×5): 175 ug via ORAL
  Filled 2016-11-23 (×5): qty 1

## 2016-11-23 MED ORDER — MORPHINE SULFATE (PF) 4 MG/ML IV SOLN
2.0000 mg | INTRAVENOUS | Status: DC | PRN
  Administered 2016-11-24 – 2016-11-26 (×10): 2 mg via INTRAVENOUS
  Filled 2016-11-23 (×10): qty 1

## 2016-11-23 MED ORDER — ACETAMINOPHEN 650 MG RE SUPP: 325.0000 mg | Freq: Four times a day (QID) | RECTAL | Status: DC | PRN

## 2016-11-23 MED ORDER — INSULIN ASPART 100 UNIT/ML ~~LOC~~ SOLN
0.0000 [IU] | Freq: Three times a day (TID) | SUBCUTANEOUS | Status: DC
  Administered 2016-11-24: 11 [IU] via SUBCUTANEOUS
  Administered 2016-11-25 (×2): 5 [IU] via SUBCUTANEOUS
  Administered 2016-11-25 – 2016-11-28 (×3): 3 [IU] via SUBCUTANEOUS

## 2016-11-23 MED ORDER — HEMOSTATIC AGENTS (NO CHARGE) OPTIME
TOPICAL | Status: DC | PRN
  Administered 2016-11-23: 1 via TOPICAL

## 2016-11-23 MED ORDER — ALBUTEROL SULFATE HFA 108 (90 BASE) MCG/ACT IN AERS
INHALATION_SPRAY | RESPIRATORY_TRACT | Status: DC | PRN
  Administered 2016-11-23: 2 via RESPIRATORY_TRACT
  Administered 2016-11-23: 4 via RESPIRATORY_TRACT

## 2016-11-23 MED ORDER — LIDOCAINE HCL (CARDIAC) 20 MG/ML IV SOLN
INTRAVENOUS | Status: DC | PRN
  Administered 2016-11-23: 60 mg via INTRATRACHEAL

## 2016-11-23 MED ORDER — ATORVASTATIN CALCIUM 80 MG PO TABS
80.0000 mg | ORAL_TABLET | Freq: Every day | ORAL | Status: DC
  Administered 2016-11-23 – 2016-11-27 (×5): 80 mg via ORAL
  Filled 2016-11-23 (×5): qty 1

## 2016-11-23 MED ORDER — ALPRAZOLAM 0.5 MG PO TABS: 1.0000 mg | ORAL_TABLET | Freq: Two times a day (BID) | ORAL | Status: DC | PRN

## 2016-11-23 MED ORDER — ALBUTEROL SULFATE (2.5 MG/3ML) 0.083% IN NEBU
INHALATION_SOLUTION | RESPIRATORY_TRACT | Status: AC
  Filled 2016-11-23: qty 3

## 2016-11-23 MED ORDER — PAPAVERINE HCL 30 MG/ML IJ SOLN
INTRAMUSCULAR | Status: AC
  Filled 2016-11-23: qty 2

## 2016-11-23 MED ORDER — OXYCODONE HCL 5 MG PO TABS
5.0000 mg | ORAL_TABLET | Freq: Three times a day (TID) | ORAL | Status: DC
  Administered 2016-11-23 – 2016-11-26 (×10): 5 mg via ORAL
  Filled 2016-11-23 (×10): qty 1

## 2016-11-23 MED ORDER — LACTATED RINGERS IV SOLN
INTRAVENOUS | Status: DC | PRN
  Administered 2016-11-23 (×2): via INTRAVENOUS

## 2016-11-23 MED ORDER — DOCUSATE SODIUM 100 MG PO CAPS
100.0000 mg | ORAL_CAPSULE | Freq: Every day | ORAL | Status: DC
  Administered 2016-11-24 – 2016-11-27 (×4): 100 mg via ORAL
  Filled 2016-11-23 (×5): qty 1

## 2016-11-23 MED ORDER — HEPARIN SODIUM (PORCINE) 1000 UNIT/ML IJ SOLN
INTRAMUSCULAR | Status: AC
  Filled 2016-11-23: qty 1

## 2016-11-23 MED ORDER — FENOFIBRATE 160 MG PO TABS
160.0000 mg | ORAL_TABLET | Freq: Every day | ORAL | Status: DC
  Administered 2016-11-24 – 2016-11-28 (×5): 160 mg via ORAL
  Filled 2016-11-23 (×5): qty 1

## 2016-11-23 MED ORDER — HEPARIN SODIUM (PORCINE) 5000 UNIT/ML IJ SOLN
5000.0000 [IU] | Freq: Three times a day (TID) | INTRAMUSCULAR | Status: DC
  Administered 2016-11-24 – 2016-11-28 (×10): 5000 [IU] via SUBCUTANEOUS
  Filled 2016-11-23 (×10): qty 1

## 2016-11-23 MED ORDER — DEXTROSE 5 % IV SOLN
1.5000 g | INTRAVENOUS | Status: AC
  Administered 2016-11-23: 1.5 g via INTRAVENOUS
  Filled 2016-11-23 (×2): qty 1.5

## 2016-11-23 MED ORDER — ALBUTEROL SULFATE (2.5 MG/3ML) 0.083% IN NEBU: 2.5000 mg | INHALATION_SOLUTION | Freq: Four times a day (QID) | RESPIRATORY_TRACT | Status: DC | PRN

## 2016-11-23 MED ORDER — ACETAMINOPHEN 325 MG PO TABS: 325.0000 mg | ORAL_TABLET | Freq: Four times a day (QID) | ORAL | Status: DC | PRN

## 2016-11-23 MED ORDER — IOPAMIDOL (ISOVUE-300) INJECTION 61%
INTRAVENOUS | Status: AC
  Filled 2016-11-23: qty 50

## 2016-11-23 MED ORDER — OXYCODONE-ACETAMINOPHEN 5-325 MG PO TABS
1.0000 | ORAL_TABLET | Freq: Three times a day (TID) | ORAL | Status: DC
  Administered 2016-11-23 – 2016-11-26 (×10): 1 via ORAL
  Filled 2016-11-23 (×10): qty 1

## 2016-11-23 MED ORDER — PROMETHAZINE HCL 25 MG PO TABS: 25.0000 mg | ORAL_TABLET | Freq: Every day | ORAL | Status: DC | PRN

## 2016-11-23 MED ORDER — MIDAZOLAM HCL 5 MG/5ML IJ SOLN
INTRAMUSCULAR | Status: DC | PRN
  Administered 2016-11-23: 1 mg via INTRAVENOUS

## 2016-11-23 MED ORDER — ROCURONIUM BROMIDE 100 MG/10ML IV SOLN
INTRAVENOUS | Status: DC | PRN
  Administered 2016-11-23: 50 mg via INTRAVENOUS
  Administered 2016-11-23: 20 mg via INTRAVENOUS

## 2016-11-23 MED ORDER — ALUM & MAG HYDROXIDE-SIMETH 200-200-20 MG/5ML PO SUSP
15.0000 mL | ORAL | Status: DC | PRN
  Administered 2016-11-25: 30 mL via ORAL
  Filled 2016-11-23: qty 30

## 2016-11-23 MED ORDER — SUGAMMADEX SODIUM 200 MG/2ML IV SOLN
INTRAVENOUS | Status: DC | PRN
  Administered 2016-11-23: 150 mg via INTRAVENOUS

## 2016-11-23 MED ORDER — PROTAMINE SULFATE 10 MG/ML IV SOLN
INTRAVENOUS | Status: DC | PRN
  Administered 2016-11-23 (×5): 10 mg via INTRAVENOUS

## 2016-11-23 MED ORDER — EPHEDRINE SULFATE 50 MG/ML IJ SOLN
INTRAMUSCULAR | Status: DC | PRN
  Administered 2016-11-23 (×5): 5 mg via INTRAVENOUS

## 2016-11-23 MED ORDER — MIDAZOLAM HCL 2 MG/2ML IJ SOLN
INTRAMUSCULAR | Status: AC
  Filled 2016-11-23: qty 2

## 2016-11-23 MED ORDER — METOPROLOL TARTRATE 100 MG PO TABS
100.0000 mg | ORAL_TABLET | Freq: Two times a day (BID) | ORAL | Status: DC
  Administered 2016-11-23 – 2016-11-28 (×10): 100 mg via ORAL
  Filled 2016-11-23 (×12): qty 1

## 2016-11-23 MED ORDER — POLYETHYLENE GLYCOL 3350 17 G PO PACK: 17.0000 g | PACK | Freq: Every day | ORAL | Status: DC | PRN

## 2016-11-23 MED ORDER — 0.9 % SODIUM CHLORIDE (POUR BTL) OPTIME
TOPICAL | Status: DC | PRN
  Administered 2016-11-23 (×2): 1000 mL

## 2016-11-23 MED ORDER — ALBUMIN HUMAN 5 % IV SOLN
INTRAVENOUS | Status: DC | PRN
  Administered 2016-11-23: 14:00:00 via INTRAVENOUS

## 2016-11-23 MED ORDER — OXYCODONE HCL ER 15 MG PO T12A
15.0000 mg | EXTENDED_RELEASE_TABLET | Freq: Two times a day (BID) | ORAL | Status: DC
  Administered 2016-11-23 – 2016-11-28 (×10): 15 mg via ORAL
  Filled 2016-11-23 (×10): qty 1

## 2016-11-23 MED ORDER — PANTOPRAZOLE SODIUM 40 MG PO TBEC: 40.0000 mg | DELAYED_RELEASE_TABLET | Freq: Every day | ORAL | Status: DC

## 2016-11-23 MED ORDER — FERROUS SULFATE 325 (65 FE) MG PO TABS
325.0000 mg | ORAL_TABLET | Freq: Three times a day (TID) | ORAL | Status: DC
  Administered 2016-11-23 – 2016-11-28 (×14): 325 mg via ORAL
  Filled 2016-11-23 (×14): qty 1

## 2016-11-23 MED ORDER — HYDROMORPHONE HCL 1 MG/ML IJ SOLN
0.2500 mg | INTRAMUSCULAR | Status: DC | PRN
  Administered 2016-11-23: 0.5 mg via INTRAVENOUS
  Administered 2016-11-23 (×4): 0.25 mg via INTRAVENOUS

## 2016-11-23 MED ORDER — ASPIRIN EC 81 MG PO TBEC
81.0000 mg | DELAYED_RELEASE_TABLET | Freq: Every day | ORAL | Status: DC
  Administered 2016-11-24 – 2016-11-27 (×4): 81 mg via ORAL
  Filled 2016-11-23 (×4): qty 1

## 2016-11-23 MED ORDER — VITAMIN D (ERGOCALCIFEROL) 1.25 MG (50000 UNIT) PO CAPS: 50000.0000 [IU] | ORAL_CAPSULE | ORAL | Status: DC

## 2016-11-23 MED ORDER — PAROXETINE HCL 20 MG PO TABS
40.0000 mg | ORAL_TABLET | Freq: Every day | ORAL | Status: DC
  Administered 2016-11-23 – 2016-11-27 (×5): 40 mg via ORAL
  Filled 2016-11-23 (×6): qty 2

## 2016-11-23 MED ORDER — MAGNESIUM SULFATE 2 GM/50ML IV SOLN
2.0000 g | Freq: Every day | INTRAVENOUS | Status: DC | PRN
  Filled 2016-11-23: qty 50

## 2016-11-23 MED ORDER — ONDANSETRON HCL 4 MG/2ML IJ SOLN
INTRAMUSCULAR | Status: DC | PRN
  Administered 2016-11-23: 4 mg via INTRAVENOUS

## 2016-11-23 MED ORDER — PROTAMINE SULFATE 10 MG/ML IV SOLN
INTRAVENOUS | Status: AC
  Filled 2016-11-23: qty 5

## 2016-11-23 MED ORDER — ONDANSETRON HCL 4 MG/2ML IJ SOLN
INTRAMUSCULAR | Status: AC
  Filled 2016-11-23: qty 2

## 2016-11-23 MED ORDER — PANTOPRAZOLE SODIUM 40 MG PO TBEC
40.0000 mg | DELAYED_RELEASE_TABLET | Freq: Every day | ORAL | Status: DC
  Administered 2016-11-24 – 2016-11-28 (×5): 40 mg via ORAL
  Filled 2016-11-23 (×6): qty 1

## 2016-11-23 MED ORDER — SODIUM CHLORIDE 0.9 % IV SOLN
INTRAVENOUS | Status: DC
  Administered 2016-11-23: 14:00:00 via INTRAVENOUS

## 2016-11-23 MED ORDER — SODIUM CHLORIDE 0.9 % IV SOLN: 500.0000 mL | Freq: Once | INTRAVENOUS | Status: DC | PRN

## 2016-11-23 MED ORDER — POTASSIUM CHLORIDE CRYS ER 20 MEQ PO TBCR: 20.0000 meq | EXTENDED_RELEASE_TABLET | Freq: Every day | ORAL | Status: DC | PRN

## 2016-11-23 MED ORDER — OXYCODONE-ACETAMINOPHEN 10-325 MG PO TABS: 1.0000 | ORAL_TABLET | Freq: Three times a day (TID) | ORAL | Status: DC

## 2016-11-23 MED ORDER — AMLODIPINE BESYLATE 10 MG PO TABS
10.0000 mg | ORAL_TABLET | Freq: Every day | ORAL | Status: DC
  Administered 2016-11-23 – 2016-11-27 (×5): 10 mg via ORAL
  Filled 2016-11-23 (×5): qty 1

## 2016-11-23 MED ORDER — SODIUM CHLORIDE 0.9 % IV SOLN
INTRAVENOUS | Status: DC
  Administered 2016-11-23: 100 mL/h via INTRAVENOUS
  Administered 2016-11-24: 05:00:00 via INTRAVENOUS

## 2016-11-23 MED ORDER — ALBUTEROL SULFATE (2.5 MG/3ML) 0.083% IN NEBU
3.0000 mL | INHALATION_SOLUTION | Freq: Four times a day (QID) | RESPIRATORY_TRACT | Status: DC | PRN
  Administered 2016-11-23: 3 mL via RESPIRATORY_TRACT
  Filled 2016-11-23: qty 3

## 2016-11-23 MED ORDER — FENTANYL CITRATE (PF) 250 MCG/5ML IJ SOLN
INTRAMUSCULAR | Status: DC | PRN
  Administered 2016-11-23 (×4): 50 ug via INTRAVENOUS
  Administered 2016-11-23: 25 ug via INTRAVENOUS
  Administered 2016-11-23 (×3): 50 ug via INTRAVENOUS

## 2016-11-23 MED ORDER — DEXTROSE 5 % IV SOLN
1.5000 g | Freq: Two times a day (BID) | INTRAVENOUS | Status: AC
  Administered 2016-11-23 – 2016-11-24 (×2): 1.5 g via INTRAVENOUS
  Filled 2016-11-23 (×2): qty 1.5

## 2016-11-23 MED ORDER — HYDRALAZINE HCL 20 MG/ML IJ SOLN: 5.0000 mg | INTRAMUSCULAR | Status: DC | PRN

## 2016-11-23 MED ORDER — PHENYLEPHRINE HCL 10 MG/ML IJ SOLN
INTRAVENOUS | Status: DC | PRN
  Administered 2016-11-23: 25 ug/min via INTRAVENOUS

## 2016-11-23 MED ORDER — GLYCOPYRROLATE 0.2 MG/ML IJ SOLN
INTRAMUSCULAR | Status: DC | PRN
  Administered 2016-11-23: .2 mg via INTRAVENOUS

## 2016-11-23 SURGICAL SUPPLY — 68 items
BANDAGE ESMARK 6X9 LF (GAUZE/BANDAGES/DRESSINGS) IMPLANT
BNDG ESMARK 6X9 LF (GAUZE/BANDAGES/DRESSINGS)
CANISTER SUCT 3000ML PPV (MISCELLANEOUS) ×3 IMPLANT
CANNULA VESSEL 3MM 2 BLNT TIP (CANNULA) ×6 IMPLANT
CLIP TI MEDIUM 24 (CLIP) ×3 IMPLANT
CLIP TI WIDE RED SMALL 24 (CLIP) ×9 IMPLANT
CUFF TOURNIQUET SINGLE 24IN (TOURNIQUET CUFF) IMPLANT
CUFF TOURNIQUET SINGLE 34IN LL (TOURNIQUET CUFF) IMPLANT
CUFF TOURNIQUET SINGLE 44IN (TOURNIQUET CUFF) IMPLANT
DERMABOND ADVANCED (GAUZE/BANDAGES/DRESSINGS) ×2
DERMABOND ADVANCED .7 DNX12 (GAUZE/BANDAGES/DRESSINGS) ×4 IMPLANT
DRAIN HEMOVAC 1/8 X 5 (WOUND CARE) ×3 IMPLANT
DRAIN SNY WOU (WOUND CARE) IMPLANT
DRAPE HALF SHEET 40X57 (DRAPES) ×9 IMPLANT
DRAPE X-RAY CASS 24X20 (DRAPES) IMPLANT
DRESSING ALLEVYN LIFE SACRUM (GAUZE/BANDAGES/DRESSINGS) ×3 IMPLANT
ELECT REM PT RETURN 9FT ADLT (ELECTROSURGICAL) ×3
ELECTRODE REM PT RTRN 9FT ADLT (ELECTROSURGICAL) ×2 IMPLANT
EVACUATOR SILICONE 100CC (DRAIN) ×3 IMPLANT
GAUZE SPONGE 4X4 16PLY XRAY LF (GAUZE/BANDAGES/DRESSINGS) ×6 IMPLANT
GLOVE BIO SURGEON STRL SZ 6.5 (GLOVE) ×12 IMPLANT
GLOVE BIO SURGEON STRL SZ7.5 (GLOVE) ×3 IMPLANT
GLOVE BIOGEL PI IND STRL 6.5 (GLOVE) ×14 IMPLANT
GLOVE BIOGEL PI IND STRL 7.5 (GLOVE) ×4 IMPLANT
GLOVE BIOGEL PI INDICATOR 6.5 (GLOVE) ×7
GLOVE BIOGEL PI INDICATOR 7.5 (GLOVE) ×2
GLOVE ECLIPSE 7.0 STRL STRAW (GLOVE) ×3 IMPLANT
GLOVE ECLIPSE 7.5 STRL STRAW (GLOVE) ×3 IMPLANT
GLOVE SURG SS PI 6.0 STRL IVOR (GLOVE) ×6 IMPLANT
GLOVE SURG SS PI 6.5 STRL IVOR (GLOVE) ×6 IMPLANT
GOWN STRL REUS W/ TWL LRG LVL3 (GOWN DISPOSABLE) ×14 IMPLANT
GOWN STRL REUS W/TWL LRG LVL3 (GOWN DISPOSABLE) ×7
HEMOSTAT SPONGE AVITENE ULTRA (HEMOSTASIS) ×3 IMPLANT
KIT BASIN OR (CUSTOM PROCEDURE TRAY) ×3 IMPLANT
KIT ROOM TURNOVER OR (KITS) ×3 IMPLANT
LOOP VESSEL MINI RED (MISCELLANEOUS) ×3 IMPLANT
MARKER SKIN DUAL TIP RULER LAB (MISCELLANEOUS) ×3 IMPLANT
NS IRRIG 1000ML POUR BTL (IV SOLUTION) ×6 IMPLANT
PACK PERIPHERAL VASCULAR (CUSTOM PROCEDURE TRAY) ×3 IMPLANT
PAD ARMBOARD 7.5X6 YLW CONV (MISCELLANEOUS) ×6 IMPLANT
SET COLLECT BLD 21X3/4 12 (NEEDLE) IMPLANT
SPONGE LAP 18X18 X RAY DECT (DISPOSABLE) ×6 IMPLANT
STAPLER VISISTAT 35W (STAPLE) IMPLANT
STOPCOCK 4 WAY LG BORE MALE ST (IV SETS) IMPLANT
SUT ETHILON 3 0 PS 1 (SUTURE) ×3 IMPLANT
SUT PROLENE 5 0 C 1 24 (SUTURE) ×6 IMPLANT
SUT PROLENE 6 0 CC (SUTURE) ×27 IMPLANT
SUT PROLENE 7 0 BV 1 (SUTURE) ×18 IMPLANT
SUT PROLENE 7 0 BV1 MDA (SUTURE) IMPLANT
SUT SILK 0 TIES 10X30 (SUTURE) ×3 IMPLANT
SUT SILK 2 0 (SUTURE) ×1
SUT SILK 2 0 SH (SUTURE) ×3 IMPLANT
SUT SILK 2-0 18XBRD TIE 12 (SUTURE) ×2 IMPLANT
SUT SILK 3 0 (SUTURE) ×2
SUT SILK 3-0 18XBRD TIE 12 (SUTURE) ×4 IMPLANT
SUT VIC AB 2-0 SH 27 (SUTURE) ×2
SUT VIC AB 2-0 SH 27XBRD (SUTURE) ×4 IMPLANT
SUT VIC AB 3-0 SH 27 (SUTURE) ×7
SUT VIC AB 3-0 SH 27X BRD (SUTURE) ×14 IMPLANT
SUT VIC AB 4-0 PS2 27 (SUTURE) ×9 IMPLANT
SUT VICRYL 4-0 PS2 18IN ABS (SUTURE) ×12 IMPLANT
SYR BULB IRRIGATION 50ML (SYRINGE) ×3 IMPLANT
TAPE UMBILICAL COTTON 1/8X30 (MISCELLANEOUS) IMPLANT
TRAY FOLEY W/METER SILVER 16FR (SET/KITS/TRAYS/PACK) ×3 IMPLANT
TROCAR XCEL UNIV SLVE 11M 100M (ENDOMECHANICALS) ×3 IMPLANT
TUBING EXTENTION W/L.L. (IV SETS) IMPLANT
UNDERPAD 30X30 (UNDERPADS AND DIAPERS) ×3 IMPLANT
WATER STERILE IRR 1000ML POUR (IV SOLUTION) ×3 IMPLANT

## 2016-11-23 NOTE — Anesthesia Procedure Notes (Signed)
Procedure Name: Intubation Date/Time: 11/23/2016 8:44 AM Performed by: Mariea Clonts Pre-anesthesia Checklist: Patient identified, Emergency Drugs available, Suction available and Patient being monitored Patient Re-evaluated:Patient Re-evaluated prior to inductionOxygen Delivery Method: Circle System Utilized Preoxygenation: Pre-oxygenation with 100% oxygen Intubation Type: IV induction Ventilation: Mask ventilation without difficulty Laryngoscope Size: Miller and 2 Grade View: Grade I Tube type: Subglottic suction tube Tube size: 7.0 mm Number of attempts: 1 Airway Equipment and Method: Stylet and Oral airway Placement Confirmation: ETT inserted through vocal cords under direct vision,  positive ETCO2 and breath sounds checked- equal and bilateral Tube secured with: Tape Dental Injury: Teeth and Oropharynx as per pre-operative assessment

## 2016-11-23 NOTE — Anesthesia Postprocedure Evaluation (Signed)
Anesthesia Post Note  Patient: PRESCILLA MONGER  Procedure(s) Performed: Procedure(s) (LRB): ENDARTERECTOMY OF LEFT EXTERNAL COMMON FEMORAL ARTERY WITH EXTENDED LEFT PROFUNDOPLASTY (Left) LEFT FEMORAL-BELOW KNEE POPLITEAL ARTERY BYPASS (Left)  Patient location during evaluation: PACU Anesthesia Type: General Level of consciousness: awake and alert Pain management: pain level controlled Vital Signs Assessment: post-procedure vital signs reviewed and stable Respiratory status: spontaneous breathing, nonlabored ventilation and respiratory function stable Cardiovascular status: blood pressure returned to baseline and stable Postop Assessment: no signs of nausea or vomiting Anesthetic complications: no       Last Vitals:  Vitals:   11/23/16 1756 11/23/16 1815  BP: (!) 114/47   Pulse: (!) 57   Resp: 13   Temp:  36.9 C    Last Pain:  Vitals:   11/23/16 0717  PainSc: 0-No pain                 Myisha Pickerel,W. EDMOND

## 2016-11-23 NOTE — H&P (View-Only) (Signed)
[name] NOTE   MRN : [id number]  Reason for Consult: Left heel ulcer Referring Physician: Dr. Fletcher Anon  History of Present Illness: 61 y/o female with left heel ulcer since Nov. 2017.  She has been going to the wound center with out success.  She also reports symptoms of claudication at 20 feet for over a year.   She has also reported rest pain that wakes her up 3-4 times a week.   She underwent agram today by Dr Fletcher Anon which showed left common femoral stenosis and left SFA above knee pop occlusion with 3 vessel runoff but primary left PT.  She has similar findings in right leg but no open wound. ABI was done 6/17 and it was less than 0.4 bilaterally.  REcent ABI at [facility] wound center 0.2 bilat.   Past medical history: CAD s/p by pass right leg vein harvest, PAD,carotid stenosis followed by Cardiologist, CKD Cr 1.8, HTN and hyperlipidemia.  She is medically managed with a statin, beta blocker, aspirin and pletal daily.        Current Facility-Administered Medications  Medication Dose Route Frequency Provider Last Rate Last Dose  . 0.9 %  sodium chloride infusion  250 mL Intravenous PRN [name] A, MD      . 0.9 %  sodium chloride infusion   Intravenous Continuous Wellington Hampshire, MD 100 mL/hr at 11/16/16 0950    . 0.9 %  sodium chloride infusion   Intravenous Continuous [name] A, MD 100 mL/hr at 11/16/16 1412 600 mL at 11/16/16 1412  . aspirin 81 MG chewable tablet           . Morphine Sulfate (PF) SOLN 2 mg  2 mg Intravenous Q2H PRN [name] A, MD      . sodium chloride flush (NS) 0.9 % injection 3 mL  3 mL Intravenous Q12H Arida, Muhammad A, MD      . sodium chloride flush (NS) 0.9 % injection 3 mL  3 mL Intravenous PRN Wellington Hampshire, MD        Pt meds include: Statin :Yes Betablocker: Yes ASA: Yes Other anticoagulants/antiplatelets: none  Past Medical History:  Diagnosis Date  . Adenomatous polyp of colon    . Anemia   . Asthma   . Carpal tunnel syndrome   . Chronic renal insufficiency   . Coronary artery disease   . Depression   . Depression with anxiety   . Diabetes mellitus    type II  . Diverticulosis of colon (without mention of hemorrhage)   . GERD (gastroesophageal reflux disease)   . Hyperlipidemia   . Hypertension   . Obesity   . PVD (peripheral vascular disease) ([place])   . Thyroid disease    hypo    Past Surgical History:  Procedure Laterality Date  . ABDOMINAL AORTAGRAM N/A 12/18/2013   Procedure: ABDOMINAL [name];  Surgeon: Wellington Hampshire, MD;  Location: [facility] CATH [facility];  Service: Cardiovascular;  Laterality: N/A;  . ABDOMINAL HYSTERECTOMY    . APPENDECTOMY    . CHOLECYSTECTOMY    . CORONARY ARTERY BYPASS GRAFT     x4  . KNEE SURGERY      Social History Social History  Substance Use Topics  . Smoking status: Former Smoker    Quit date: 2004  . Smokeless tobacco: Never Used  . Alcohol use No    Family History Family History  Problem Relation Age of Onset  . Diabetes Mother   .  Heart disease Father   . Colon polyps Daughter   . Colon cancer Neg Hx   . Esophageal cancer Neg Hx   . Stomach cancer Neg Hx   . Rectal cancer Neg Hx     No Known Allergies   REVIEW OF SYSTEMS  General: [ ]  Weight loss, [ ]  Fever, [ ]  chills Neurologic: [ ]  Dizziness, [ ]  Blackouts, [ ]  Seizure [ ]  Stroke, [ ]  "Mini stroke", [ ]  Slurred speech, [ ]  Temporary blindness; [ ]  weakness in arms or legs, [ ]  Hoarseness [ ]  Dysphagia Cardiac: [ ]  Chest pain/pressure, [ ]  Shortness of breath at rest [ ]  Shortness of breath with exertion, [ ]  Atrial fibrillation or irregular heartbeat  Vascular: [x ] Pain in legs with walking, [x ] Pain in legs at rest, [x ] Pain in legs at night,  [x ] Non-healing ulcer, [ ]  Blood clot in vein/DVT,   Pulmonary: [ ]  Home oxygen, [ ]  Productive cough, [ ]  Coughing up blood, [x ] Asthma,  [ ]  Wheezing [ ]  COPD Musculoskeletal:  [ ]  Arthritis, [ ]   Low back pain, [ ]  Joint pain Hematologic: [ ]  Easy Bruising, [x ] Anemia; [ ]  Hepatitis Gastrointestinal: [ ]  Blood in stool, [ ]  Gastroesophageal Reflux/heartburn, Urinary: [x ] chronic Kidney disease, [ ]  on HD - [ ]  MWF or [ ]  TTHS, [ ]  Burning with urination, [ ]  Difficulty urinating Skin: [ ]  Rashes, [x ] Wounds Psychological: [ ]  Anxiety, [x ] Depression  Physical Examination Vitals:   11/16/16 1351 11/16/16 1351 11/16/16 1356 11/16/16 1400  BP:  (!) 183/62 (!) 154/54 (!) 151/51  Pulse: 71 74 73   Resp: 13 (!) 9 13   Temp:      TempSrc:      SpO2: 94% 95% 92%   Weight:      Height:       Body mass index is 30.11 kg/m.  General:  WDWN in NAD HENT: WNL Eyes: Pupils equal Pulmonary: normal non-labored breathing , without Rales, rhonchi,  wheezing Cardiac: RRR, without  Murmurs, rubs or gallops; No carotid bruits Abdomen: soft, NT, no masses Skin: no rashes, 1 x 1 cm dark eschar wound left heel;  no Gangrene , no cellulitis; no open wounds;   Vascular Exam/Pulses:Radial pulses intact B, right femoral, weak left femoral, right popliteal palpable, Doppler right and left high AT signal weak   Musculoskeletal: no muscle wasting or atrophy; no edema  Neurologic: A&O X 3; Appropriate Affect ;  SENSATION: normal; MOTOR FUNCTION: grossly intact Speech is fluent/normal   Significant Diagnostic Studies: CBC Lab Results  Component Value Date   WBC 8.0 11/16/2016   HGB 8.9 (L) 11/16/2016   HCT 26.7 (L) 11/16/2016   MCV 90.5 11/16/2016   PLT 168 11/16/2016    BMET    Component Value Date/Time   NA 133 (L) 11/16/2016 0932   K 4.4 11/16/2016 0932   CL 104 11/16/2016 0932   CO2 20 (L) 11/16/2016 0932   GLUCOSE 242 (H) 11/16/2016 0932   BUN 42 (H) 11/16/2016 0932   CREATININE 1.80 (H) 11/16/2016 0932   CALCIUM 8.3 (L) 11/16/2016 0932   GFRNONAA 29 (L) 11/16/2016 0932   GFRAA 34 (L) 11/16/2016 0932   Estimated Creatinine Clearance: 32.3 mL/min (A) (by C-G formula  based on SCr of 1.8 mg/dL (H)).  COAG Lab Results  Component Value Date   INR 1.05 11/16/2016   INR 0.9 12/10/2013  Non-Invasive Vascular Imaging:   ASSESSMENT/PLAN:  PAD with chronic left heel wound. Left femoral endarterectomy and left femoral to popliteal artery bypass.   Laurence Slate [facility] 11/16/2016 2:27 PM  Left heel wound not healing with conservative measures.  Severe PAD on agram. Agree with above.  Will plan for left femoral endarterectomy with left fem below knee pop bypass next Wednesday 11/23/16.  Risks benefits possible complications discussed including but not limited to bleeding infection myocardial events graft thrombosis possible limb loss.  She agrees to proceed.  Ruta Hinds, MD [facility] [place] Office: [phone] Pager: [phone]

## 2016-11-23 NOTE — Anesthesia Procedure Notes (Signed)
Arterial Line Insertion Start/End5/16/2018 8:50 AM, 11/23/2016 9:00 AM Performed by: Roderic Palau, anesthesiologist  Patient location: OR. Preanesthetic checklist: patient identified, IV checked, site marked, risks and benefits discussed, surgical consent, monitors and equipment checked, pre-op evaluation and timeout performed Left, brachial was placed Catheter size: 20 G Hand hygiene performed , maximum sterile barriers used  and Seldinger technique used  Attempts: 1 Procedure performed using ultrasound guided technique. Ultrasound Notes:anatomy identified, needle tip was noted to be adjacent to the nerve/plexus identified, no ultrasound evidence of intravascular and/or intraneural injection and image(s) printed for medical record Following insertion, line sutured, dressing applied and Biopatch. Post procedure assessment: normal  Patient tolerated the procedure well with no immediate complications.

## 2016-11-23 NOTE — Transfer of Care (Signed)
Immediate Anesthesia Transfer of Care Note  Patient: [name]  Procedure(s) Performed: Procedure(s): ENDARTERECTOMY OF LEFT EXTERNAL COMMON FEMORAL ARTERY WITH EXTENDED LEFT PROFUNDOPLASTY (Left) LEFT FEMORAL-BELOW KNEE POPLITEAL ARTERY BYPASS (Left)  Patient Location: PACU  Anesthesia Type:General  Level of Consciousness: awake, alert  and oriented  Airway & Oxygen Therapy: Patient Spontanous Breathing and Patient connected to face mask oxygen  Post-op Assessment: Report given to RN, Post -op Vital signs reviewed and stable and Patient moving all extremities X 4  Post vital signs: Reviewed and stable  Last Vitals:  Vitals:   11/23/16 0649  BP: 102/61  Pulse: (!) 51  Resp: 18  Temp: 36.8 C    Last Pain:  Vitals:   11/23/16 0717  PainSc: 0-No pain      Patients Stated Pain Goal: 5 (99/24/26 8341)  Complications: No apparent anesthesia complications

## 2016-11-23 NOTE — Interval H&P Note (Signed)
History and Physical Interval Note:  11/23/2016 8:03 AM  [name]  has presented today for surgery, with the diagnosis of Peripheral Vascular Disease with nohealing ulcer left foot  I70.244  The various methods of treatment have been discussed with the patient and family. After consideration of risks, benefits and other options for treatment, the patient has consented to  Procedure(s): ENDARTERECTOMY FEMORAL- LEFT (Left) BYPASS GRAFT LEFT FEMORAL-BELOW KNEE POPLITEAL ARTERY (Left) as a surgical intervention .  The patient's history has been reviewed, patient examined, no change in status, stable for surgery.  I have reviewed the patient's chart and labs.  Questions were answered to the patient's satisfaction.     Ruta Hinds

## 2016-11-23 NOTE — Op Note (Signed)
Procedure: Left external iliac common femoral and profunda endarterectomy with extended profundoplasty, ligation of left superficial femoral artery, left femoral to below-knee popliteal artery bypass with non-reversed greater saphenous vein  Preoperative diagnosis: Nonhealing wound left foot  Postoperative diagnosis: Same  Anesthesia: Gen.  Assistant: Gerri Lins PA-C, Leontine Locket PA-C  Findings: Severe calcific arterial occlusive disease. Greater saphenous vein 2-1/2-3 mm diameter.  Operative details: After informed consent, the patient taken the operating room. The patient was supine position on the operating table. After induction of general anesthesia and endotracheal intubation, the patient's entire left lower extremity was prepped and draped in usual sterile fashion. A scimitar-shaped incision was made in the left groin and carried down through the subcutaneoustissues down to the level left common femoral artery. This was heavily calcified circumferentially. I dissected out the external iliac artery underneath the inguinal ligament several centimeters above the circumflex iliac branches to get to the area of soft artery that was clamp mobile. Dissection was then carried down the profunda for several centimeters. Preoperative Angio had shown severe disease in this vessel. The superficial femoral artery was chronically occluded. This was also dissected free circumferentially and vessel loop placed around this. Dissection of the profunda was taken down to around the tertiary branches to get to a reasonable section of profunda but was still diseased and calcified at this level. Next the greater saphenous vein was dissected free circumferentially at the level of the groin side branches were ligated and divided between silk ties or clips. Several skip incisions were made down the thigh in order to harvest the vein all the way to the below-knee segment. A tenolysis harvested several centimeters  into the mid calf to be used as a vein patch femoral endarterectomy. The vein was a reasonable quality was fairly small but uniform in diameter about 2-1/2-3 mm in diameter. The below-knee incision was deepened into the below-knee popliteal space and carried down to the level of the popliteal artery. The popliteal artery was heavily calcified at the level of the knee joint became softer in segments with some posterior plaque distally. A take down a portion of the soleus muscle and dissected be anterior tibial and tibial peroneal trunk origins out and placed vessel loops around these. The vein was ligated in the mid calf with 2 silk tie and transected. It was disconnected from the saphenofemoral junction and this oversewn with a running 5-0 Prolene suture. The vein was gently distended with heparinized saline and several additional silk ties were placed to make sure that all side branches had been fully ligated. The vein was set aside and a tunnel was created between the heads of the gastrocnemius muscle subsartorial up to the level of the groin. The patient was given 8000 units of heparin. The patient was given 2 additional heparin boluses during the case. At this point the mid external iliac artery was clamped with a Cooley clamp and the profunda controlled distally with vessel loops. I then opened the common femoral artery which was heavily calcified and did a femoral endarterectomy extending all the way up to the distal portion of the external iliac artery to obtain a good endpoint. This was then carried down approximately 4-5 cm into the profunda were also obtain a reasonable endpoint. The distal piece of vein was opened longitudinally and seminal as a patch angioplasty using a running 6-0 Prolene suture. At completion of the anastomosis it was forebled backbled and thoroughly flushed. Anastomosis was secured clamps released. Repair sutures were placed.  There was good flow in the profunda at this point. The  superficial femoral artery was ligated and divided. Next the proximal profunda was controlled proximally and distally with Vesseloops. A longitudinal opening was made in the vein patch the vein was placed in a non-reversed configuration and sewn end of vein to side of patch using a running 6-0 Prolene suture. At completion anastomosis and then passed the valvulotome up through the vein to lyse all valves completely. There was good pulsatile flow through the vein graft. There is marked for orientation and brought through the subsartorial tunnel into the below-knee popliteal space. The vein was controlled proximally with a fine bulldog clamp. The tibia. Trunk and anterior tibial arteries were controlled with vessel loops and the proximal popliteal artery controlled with a fine bulldog clamp. Longitudinal opening was made in the below-knee popliteal artery the graft was cut to length and spatulated and sewn end of vein to side of artery running 6-0 Prolene suture. Prior completion of the anastomosis it was forebled backbled and thoroughly flushed. Anastomosis was completed there was good Doppler flow in the distal anastomosis and the vein graft and at the posterior tibial artery at the level of the foot which was her dominant runoff vessel. An intraoperative arteriogram was not performed due to the patient's renal insufficiency. Hemostasis was obtained with the assistance of 51 g of protamine and Avitene. A 10 flat Jackson-Pratt drain was brought out through separate stab incision in the left groin and sutured the skin with a nylon stitch. Everything was hemostatic at this point. We then proceeded to close all the incisions with multiple layers of running 30 and 4-0 Vicryl subcuticular stitch. Dermabond was then applied all skin incisions. The patient tolerated procedure well and there were no complications. The ansa and sponge and needle count was correct at the end of the case. The patient was taken to the recovery  room in stable condition.  Ruta Hinds, MD [facility] [place] Office: [phone] Pager: [phone]

## 2016-11-24 ENCOUNTER — Encounter (HOSPITAL_COMMUNITY): Payer: Self-pay | Admitting: Vascular Surgery

## 2016-11-24 LAB — CBC
HCT: 21.4 % — ABNORMAL LOW (ref 36.0–46.0)
HEMOGLOBIN: 7 g/dL — AB (ref 12.0–15.0)
MCH: 30.1 pg (ref 26.0–34.0)
MCHC: 31.8 g/dL (ref 30.0–36.0)
MCV: 94.7 fL (ref 78.0–100.0)
PLATELETS: 193 10*3/uL (ref 150–400)
RBC: 2.26 MIL/uL — ABNORMAL LOW (ref 3.87–5.11)
RDW: 14.4 % (ref 11.5–15.5)
WBC: 13 10*3/uL — ABNORMAL HIGH (ref 4.0–10.5)

## 2016-11-24 LAB — PREPARE RBC (CROSSMATCH)

## 2016-11-24 LAB — GLUCOSE, CAPILLARY
GLUCOSE-CAPILLARY: 100 mg/dL — AB (ref 65–99)
GLUCOSE-CAPILLARY: 103 mg/dL — AB (ref 65–99)
GLUCOSE-CAPILLARY: 312 mg/dL — AB (ref 65–99)
GLUCOSE-CAPILLARY: 94 mg/dL (ref 65–99)
Glucose-Capillary: 160 mg/dL — ABNORMAL HIGH (ref 65–99)
Glucose-Capillary: 265 mg/dL — ABNORMAL HIGH (ref 65–99)

## 2016-11-24 LAB — BASIC METABOLIC PANEL
Anion gap: 8 (ref 5–15)
BUN: 36 mg/dL — ABNORMAL HIGH (ref 6–20)
CALCIUM: 7.5 mg/dL — AB (ref 8.9–10.3)
CO2: 20 mmol/L — ABNORMAL LOW (ref 22–32)
Chloride: 107 mmol/L (ref 101–111)
Creatinine, Ser: 1.99 mg/dL — ABNORMAL HIGH (ref 0.44–1.00)
GFR calc Af Amer: 30 mL/min — ABNORMAL LOW (ref >60)
GFR, EST NON AFRICAN AMERICAN: 26 mL/min — AB (ref >60)
GLUCOSE: 98 mg/dL (ref 65–99)
Potassium: 4.6 mmol/L (ref 3.5–5.1)
Sodium: 135 mmol/L (ref 135–145)

## 2016-11-24 LAB — HEMOGLOBIN AND HEMATOCRIT, BLOOD
HCT: 28.7 % — ABNORMAL LOW (ref 36.0–46.0)
Hemoglobin: 9.4 g/dL — ABNORMAL LOW (ref 12.0–15.0)

## 2016-11-24 MED ORDER — SODIUM CHLORIDE 0.9 % IV SOLN
INTRAVENOUS | Status: DC
  Administered 2016-11-24 – 2016-11-27 (×5): via INTRAVENOUS

## 2016-11-24 MED ORDER — SODIUM CHLORIDE 0.9 % IV SOLN
Freq: Once | INTRAVENOUS | Status: AC
  Administered 2016-11-24: 12:00:00 via INTRAVENOUS

## 2016-11-24 NOTE — Progress Notes (Signed)
Inpatient Diabetes Program Recommendations  AACE/ADA: New Consensus Statement on Inpatient Glycemic Control (2015)  Target Ranges:  Prepandial:   less than 140 mg/dL      Peak postprandial:   less than 180 mg/dL (1-2 hours)      Critically ill patients:  140 - 180 mg/dL   Results for KALEEAH, [name] (MRN [id number]) as of 11/24/2016 10:05  Ref. Range 11/23/2016 06:53 11/23/2016 16:52 11/23/2016 20:23 11/23/2016 23:59 11/24/2016 04:02 11/24/2016 08:06  Glucose-Capillary Latest Ref Range: 65 - 99 mg/dL 114 (H) 96 115 (H) 103 (H) 94 100 (H)    Admit with: Non healing Foot wound  History: DM  Home DM Meds: Lantus 50-55 units BID  Current Insulin Orders: Novolog Moderate Correction Scale/ SSI (0-15 units) TID AC       MD- Received referral for assistance for post-op DM/Glucose management assistance.  Patient last received Lantus on PM of 05/15 prior to surgery (took 40 units Lantus at home PM of 05/15).  No Lantus given in hospital so far.  CBGs very well controlled so far (likely b/c patient has not been eating- just started PO diet this AM).  Will follow CBGs and call with recommendations as needed.    --Will follow patient during hospitalization--  Wyn Quaker RN, MSN, CDE Diabetes Coordinator Inpatient Glycemic Control Team Team Pager: [phone] (8a-5p)

## 2016-11-24 NOTE — Care Management Note (Addendum)
Case Management Note  Patient Details  Name: [name] MRN: [id number] Date of Birth: 1955-12-04  Subjective/Objective:   From home alone, pta indep, her daughter [name] [phone] will be staying with her at discharge.  Per pt /ot eval rec HHPT/HHOT.  Patient chose [facility] from [employer] list.  Referral given to Butch Penny with [facility].  Soc will begin 24 48 hrs post dc.      PCP listed is Nicholes Rough              Action/Plan: NCM will follow for dc needsl  Expected Discharge Date:                  Expected Discharge Plan:  [place]  In-House Referral:     Discharge planning Services  CM Consult  Post Acute Care Choice:    Choice offered to:     DME Arranged:    DME Agency:     HH Arranged:  PT, OT HH Agency:  [place]  Status of Service:  Completed, signed off  If discussed at [place] of Stay Meetings, dates discussed:    Additional Comments:  Zenon Mayo, RN 11/24/2016, 4:18 PM

## 2016-11-24 NOTE — Progress Notes (Addendum)
[facility] [place]  Subjective  - Doing well over all.  Pain issues at incision sites, but other wise OK.   Objective (!) 102/55 64 98.5 F (36.9 C) (Oral) 12 99%  Intake/Output Summary (Last 24 hours) at 11/24/16 0748 Last data filed at 11/24/16 0645  Gross per 24 hour  Intake          4363.33 ml  Output             1575 ml  Net          2788.33 ml    Left LE incision healing well, left groin with JP drain in place 60 cc out put since surgery with 10 cc in bulb currently. Lungs non labored breathing  Heart RRR   Assessment/Planning: POD # 1 Procedure: Left external iliac common femoral and profunda endarterectomy with extended profundoplasty, ligation of left superficial femoral artery, left femoral to below-knee popliteal artery bypass with non-reversed greater saphenous vein  Foley D/C'd this am Tolerating liquids Will maintain fluids HGB 7.0 this am likely Dilutional will re check H/H later today PT/OT for mobility Cr rising now 1.99, urine out put stable 150-350 per hour    COLLINS, EMMA [facility] 11/24/2016 7:48 AM -- Agree with above. Brisk PT DP doppler Keep hydrated for renal function Transfuse 2 Units PRBC for acute blood loss anemia Transfer 2w  Ruta Hinds, MD [facility] [place]: [phone] Pager: [phone]  Laboratory Lab Results:  Recent Labs  11/23/16 2044 11/24/16 0333  WBC 14.8* 13.0*  HGB 7.7* 7.0*  HCT 23.5* 21.4*  PLT 204 193   BMET  Recent Labs  11/22/16 0837 11/23/16 2044 11/24/16 0333  NA 136  --  135  K 4.7  --  4.6  CL 104  --  107  CO2 21*  --  20*  GLUCOSE 138*  --  98  BUN 34*  --  36*  CREATININE 1.95* 1.93* 1.99*  CALCIUM 8.9  --  7.5*    COAG Lab Results  Component Value Date   INR 1.11 11/22/2016   INR 1.05 11/16/2016   INR 0.9 12/10/2013   No results found for: PTT

## 2016-11-24 NOTE — Evaluation (Signed)
Physical Therapy Evaluation Patient Details Name: [name] MRN: [id number] DOB: 1955/07/28 Today's Date: 11/24/2016   History of Present Illness  Patient is a 62 y/o female s/p left femoral endarterectomy and left femoral to below-knee popliteal artery bypass with non-reversed greater saphenous vein. PMH includes CAD, depression, DM, HTN, HLD, PAD.  Clinical Impression  Patient presents with pain, post surgical deficits LLE, soft BP, and impaired mobility s/p above. Tolerated transfers and gait training with Min A for balance/safety. Pt tearful during session due to pain. Education re: positioning and stretching of left calf/hamstrings to prevent contractures. Pt Mod I PTA and will have assist from daughter at d/c for a short period. Will follow acutely to maximize independence and mobility prior to return home.    Follow Up Recommendations Home health PT;Supervision for mobility/OOB;Supervision/Assistance - 24 hour    Equipment Recommendations  Rolling walker with 5" wheels    Recommendations for Other Services       Precautions / Restrictions Precautions Precautions: Fall Precaution Comments: JP drain Restrictions Weight Bearing Restrictions: No      Mobility  Bed Mobility Overal bed mobility: Needs Assistance Bed Mobility: Rolling;Sidelying to Sit Rolling: Min guard Sidelying to sit: HOB elevated;Mod assist       General bed mobility comments: Cues for step by step techinque and assist to bring hips to EOB. Use of rail. Assist to elevate trunk.  Transfers Overall transfer level: Needs assistance Equipment used: Rolling walker (2 wheeled) Transfers: Sit to/from [facility] Sit to Stand: Min assist Stand pivot transfers: Min assist       General transfer comment: Assist to power to standing with cues for hand placement/techinque. Stood from [employer], from chair x1, from [facility] x1, transferred to chair post ambulation.  Ambulation/Gait Ambulation/Gait  assistance: Min assist Ambulation Distance (Feet): 15 Feet (+ 10') Assistive device: Rolling walker (2 wheeled) Gait Pattern/deviations: Decreased stance time - left;Step-through pattern;Decreased step length - right;Step-to pattern;Trunk flexed;Decreased dorsiflexion - left Gait velocity: decreased Gait velocity interpretation: Below normal speed for age/gender General Gait Details: Slow. guarded unsteady gait with decreased stance time LLE with pt placing most of weight through toe; decreased DF on left due to pain/pulling.  Stairs            Wheelchair Mobility    Modified Rankin (Stroke Patients Only)       Balance Overall balance assessment: Needs assistance Sitting-balance support: Feet supported;No upper extremity supported Sitting balance-Leahy Scale: Fair     Standing balance support: During functional activity Standing balance-Leahy Scale: Poor Standing balance comment: Reliant on BUEs for support in standing.                             Pertinent Vitals/Pain Pain Assessment: 0-10 Pain Score: 9  Pain Location: LLE Pain Descriptors / Indicators: Sore;Operative site guarding;Aching;Grimacing;Guarding Pain Intervention(s): Monitored during session;Repositioned;Patient requesting pain meds-RN notified;Limited activity within patient's tolerance    Home Living Family/patient expects to be discharged to:: Private residence Living Arrangements: Alone Available Help at Discharge: Family;Available 24 hours/day Type of Home: Mobile home Home Access: Stairs to enter Entrance Stairs-Rails: Right Entrance Stairs-Number of Steps: 3 Home Layout: One level Home Equipment: Walker - 4 wheels;Cane - single point;Shower seat      Prior Function Level of Independence: Independent with assistive device(s)         Comments: Uses rollator PTA. Has difficulty going longer distances. Drives.  Hand Dominance        Extremity/Trunk Assessment   Upper  Extremity Assessment Upper Extremity Assessment: Defer to OT evaluation    Lower Extremity Assessment Lower Extremity Assessment: Generalized weakness LLE Deficits / Details: Pt with difficulty extending LE due to pain, post surgical deficits limiting AROM. LLE: Unable to fully assess due to pain       Communication   Communication: No difficulties  Cognition Arousal/Alertness: Awake/alert Behavior During Therapy: WFL for tasks assessed/performed Overall Cognitive Status: Within Functional Limits for tasks assessed                                        General Comments General comments (skin integrity, edema, etc.): Sp02 remained >90% on RA. Soft BP 107/77 post session, 107/39 post tx in chair. RN notified. Pt with low hemoglobin.    Exercises     Assessment/Plan    PT Assessment Patient needs continued PT services  PT Problem List Decreased strength;Decreased mobility;Decreased activity tolerance;Pain;Decreased balance;Decreased skin integrity;Decreased range of motion       PT Treatment Interventions Therapeutic activities;Gait training;Therapeutic exercise;Patient/family education;Balance training;Functional mobility training;Stair training    PT Goals (Current goals can be found in the Care Plan section)  Acute Rehab PT Goals Patient Stated Goal: to be able to walk PT Goal Formulation: With patient Time For Goal Achievement: 12/08/16 Potential to Achieve Goals: Good    Frequency Min 3X/week   Barriers to discharge        Co-evaluation               AM-PAC PT "6 Clicks" Daily Activity  Outcome Measure Difficulty turning over in bed (including adjusting bedclothes, sheets and blankets)?: None Difficulty moving from lying on back to sitting on the side of the bed? : Total Difficulty sitting down on and standing up from a chair with arms (e.g., wheelchair, bedside commode, etc,.)?: Total Help needed moving to and from a bed to chair  (including a wheelchair)?: A Little Help needed walking in hospital room?: A Little Help needed climbing 3-5 steps with a railing? : A Lot 6 Click Score: 14    End of Session Equipment Utilized During Treatment: Gait belt Activity Tolerance: Patient limited by pain Patient left: in chair;with call bell/phone within reach;with chair alarm set Nurse Communication: Mobility status PT Visit Diagnosis: Unsteadiness on feet (R26.81);Pain Pain - Right/Left: Left Pain - part of body: Leg    Time: 0821-0852 PT Time Calculation (min) (ACUTE ONLY): 31 min   Charges:   PT Evaluation $PT Eval Moderate Complexity: 1 Procedure PT Treatments $Gait Training: 8-22 mins   PT G Codes:        Wray Kearns, PT, DPT [phone]    Marguarite Arbour A Phineas Mcenroe 11/24/2016, 9:05 AM

## 2016-11-24 NOTE — Evaluation (Signed)
Occupational Therapy Evaluation Patient Details Name: [name] MRN: [id number] DOB: [birth year] Today's Date: 11/24/2016    History of Present Illness Patient is a 61 y/o female s/p left femoral endarterectomy and left femoral to below-knee popliteal artery bypass with non-reversed greater saphenous vein. PMH includes CAD, depression, DM, HTN, HLD, PAD.   Clinical Impression   Pt reports she was independent with ADL PTA and mod I for functional mobility. Currently pt requires min guard for stand pivot transfers and max assist for LB ADL; pt limited this session by LLE pain. Pt planning to d/c home alone with intermittent supervision from family. Recommending HHOT for follow up to maximize independence and safety with ADL and functional mobility upon return home. Pt would benefit from continued skilled OT to address established goals.    Follow Up Recommendations  Home health OT;Supervision/Assistance - 24 hour (initially)   Equipment Recommendations  3 in 1 bedside commode    Recommendations for Other Services       Precautions / Restrictions Precautions Precautions: Fall Precaution Comments: JP drain Restrictions Weight Bearing Restrictions: No      Mobility Bed Mobility Overal bed mobility: Needs Assistance Bed Mobility: Sit to Supine    Sit to supine: Min assist   General bed mobility comments: Light min assist for LLE back to bed. Pt able to scoot self up to [facility] with use of bed rails  Transfers Overall transfer level: Needs assistance Equipment used: Rolling walker (2 wheeled) Transfers: Sit to/from [facility] Sit to Stand: Min guard Stand pivot transfers: Min guard       General transfer comment: Min guard for safety. Cues for hand placement and technique with RW.    Balance Overall balance assessment: Needs assistance Sitting-balance support: Feet supported;Bilateral upper extremity supported Sitting balance-Leahy Scale: Fair      Standing balance support: Bilateral upper extremity supported Standing balance-Leahy Scale: Poor Standing balance comment: Reliant on BUEs for support in standing.                           ADL either performed or assessed with clinical judgement   ADL Overall ADL's : Needs assistance/impaired Eating/Feeding: Set up;Sitting   Grooming: Set up;Supervision/safety;Sitting   Upper Body Bathing: Minimal assistance;Sitting   Lower Body Bathing: Maximal assistance;Sit to/from stand   Upper Body Dressing : Set up;Supervision/safety;Sitting   Lower Body Dressing: Maximal assistance;Sit to/from stand   Toilet Transfer: Scientist, product/process development Details (indicate cue type and reason): Simulated by stand pivot from chair to EOB         Functional mobility during ADLs: Min guard;Rolling walker (for stand pivot only)       Vision         Perception     Praxis      Pertinent Vitals/Pain Pain Assessment: 0-10 Pain Score: 9  Pain Location: LLE Pain Descriptors / Indicators: Sore;Operative site guarding;Aching;Grimacing;Guarding Pain Intervention(s): Monitored during session;Limited activity within patient's tolerance;Repositioned     Hand Dominance     Extremity/Trunk Assessment Upper Extremity Assessment Upper Extremity Assessment: Overall WFL for tasks assessed   Lower Extremity Assessment Lower Extremity Assessment: Defer to PT evaluation        Communication Communication Communication: No difficulties   Cognition Arousal/Alertness: Awake/alert Behavior During Therapy: WFL for tasks assessed/performed Overall Cognitive Status: Within Functional Limits for tasks assessed  General Comments      Exercises     Shoulder Instructions      Home Living Family/patient expects to be discharged to:: Private residence Living Arrangements: Alone Available Help at Discharge:  Family;Available PRN/intermittently Type of Home: Mobile home    Home Layout: One level     Bathroom Shower/Tub: Occupational psychologist: Standard     Home Equipment: Environmental consultant - 4 wheels;Cane - single point;Grab bars - tub/shower   Additional Comments: Inconsistent report of home set up compared to PT eval      Prior Functioning/Environment Level of Independence: Independent with assistive device(s)        Comments: Uses rollator PTA. Has difficulty going longer distances. Drives.         OT Problem List: Decreased strength;Decreased activity tolerance;Impaired balance (sitting and/or standing);Decreased knowledge of use of DME or AE;Pain      OT Treatment/Interventions: Self-care/ADL training;Energy conservation;DME and/or AE instruction;Therapeutic activities;Patient/family education;Balance training    OT Goals(Current goals can be found in the care plan section) Acute Rehab OT Goals Patient Stated Goal: decrease pain OT Goal Formulation: With patient Time For Goal Achievement: 12/08/16 Potential to Achieve Goals: Good ADL Goals Pt Will Perform Lower Body Bathing: with supervision;sit to/from stand Pt Will Perform Lower Body Dressing: with supervision;sit to/from stand Pt Will Transfer to Toilet: with supervision;ambulating;bedside commode (over toilet) Pt Will Perform Toileting - Clothing Manipulation and hygiene: with supervision;sit to/from stand Pt Will Perform Tub/Shower Transfer: Shower transfer;with supervision;ambulating;shower seat;rolling walker  OT Frequency: Min 2X/week   Barriers to D/C: Decreased caregiver support  lives alone       Co-evaluation              AM-PAC PT "6 Clicks" Daily Activity     Outcome Measure Help from another person eating meals?: None Help from another person taking care of personal grooming?: A Little Help from another person toileting, which includes using toliet, bedpan, or urinal?: A Little Help from  another person bathing (including washing, rinsing, drying)?: A Lot Help from another person to put on and taking off regular upper body clothing?: A Little Help from another person to put on and taking off regular lower body clothing?: A Lot 6 Click Score: 17   End of Session Equipment Utilized During Treatment: Rolling walker  Activity Tolerance: Patient limited by pain Patient left: in bed;with call bell/phone within reach  OT Visit Diagnosis: Unsteadiness on feet (R26.81);Other abnormalities of gait and mobility (R26.89);Pain Pain - Right/Left: Left Pain - part of body: Leg                Time: 3559-7416 OT Time Calculation (min): 12 min Charges:  OT General Charges $OT Visit: 1 Procedure OT Evaluation $OT Eval Moderate Complexity: 1 Procedure G-Codes:     Zineb Glade A. Ulice Brilliant, M.S., OTR/L Pager: [employer] 11/24/2016, 9:35 AM

## 2016-11-24 NOTE — Progress Notes (Signed)
Please disregard transfer note timed 650 am

## 2016-11-24 NOTE — Progress Notes (Signed)
Unable to transfuse blood per order yet. Patient reports she hasn't talked to a provider about getting a blood transfusion. Paged Dr. Oneida Alar without a response, paged Rudy PA and Maudie Mercury [place]. All PAs currently in the operating room and report someone will be able to come up and talk with patient around noon.

## 2016-11-24 NOTE — Progress Notes (Signed)
Patient transferred from 4E to room 2w16. Arrived via bed, accompanied by nurse and transportation technician. Patient is a/o x4, no symptoms of distress. Placed on telemetry. Daughter at bedside

## 2016-11-24 NOTE — Progress Notes (Signed)
Patient receiving second unit of blood transfusion. Blood pressure in the 90s/30-40s. Patient asymptomatic at this time. Kim, PA made aware of patient's blood pressure. PA gave okay to go through with patient's transfer to telemetry.

## 2016-11-24 NOTE — Progress Notes (Signed)
Patient transferred from 4 E to room 2w16. Arrived via bed. Placed on telemetry,called to remote tele. Patient is a/ox4, no symptoms of distress

## 2016-11-25 ENCOUNTER — Inpatient Hospital Stay (HOSPITAL_COMMUNITY): Payer: Medicare Other

## 2016-11-25 ENCOUNTER — Other Ambulatory Visit (HOSPITAL_COMMUNITY): Payer: Self-pay

## 2016-11-25 DIAGNOSIS — L8990 Pressure ulcer of unspecified site, unspecified stage: Secondary | ICD-10-CM | POA: Insufficient documentation

## 2016-11-25 DIAGNOSIS — Z95828 Presence of other vascular implants and grafts: Secondary | ICD-10-CM

## 2016-11-25 LAB — BPAM RBC
BLOOD PRODUCT EXPIRATION DATE: 201806012359
BLOOD PRODUCT EXPIRATION DATE: 201806012359
ISSUE DATE / TIME: 201805171233
ISSUE DATE / TIME: 201805171531
Unit Type and Rh: 6200
Unit Type and Rh: 6200

## 2016-11-25 LAB — TYPE AND SCREEN
ABO/RH(D): A POS
ANTIBODY SCREEN: NEGATIVE
Unit division: 0
Unit division: 0

## 2016-11-25 LAB — GLUCOSE, CAPILLARY
GLUCOSE-CAPILLARY: 213 mg/dL — AB (ref 65–99)
GLUCOSE-CAPILLARY: 138 mg/dL — AB (ref 65–99)
Glucose-Capillary: 239 mg/dL — ABNORMAL HIGH (ref 65–99)
Glucose-Capillary: 194 mg/dL — ABNORMAL HIGH (ref 65–99)

## 2016-11-25 LAB — BASIC METABOLIC PANEL
Anion gap: 9 (ref 5–15)
BUN: 34 mg/dL — AB (ref 6–20)
CO2: 20 mmol/L — AB (ref 22–32)
CREATININE: 1.85 mg/dL — AB (ref 0.44–1.00)
Calcium: 7.8 mg/dL — ABNORMAL LOW (ref 8.9–10.3)
Chloride: 103 mmol/L (ref 101–111)
GFR calc Af Amer: 33 mL/min — ABNORMAL LOW (ref >60)
GFR calc non Af Amer: 28 mL/min — ABNORMAL LOW (ref >60)
Glucose, Bld: 274 mg/dL — ABNORMAL HIGH (ref 65–99)
POTASSIUM: 4.6 mmol/L (ref 3.5–5.1)
Sodium: 132 mmol/L — ABNORMAL LOW (ref 135–145)

## 2016-11-25 LAB — CBC
HEMATOCRIT: 28.8 % — AB (ref 36.0–46.0)
HEMOGLOBIN: 9.5 g/dL — AB (ref 12.0–15.0)
MCH: 30.2 pg (ref 26.0–34.0)
MCHC: 33 g/dL (ref 30.0–36.0)
MCV: 91.4 fL (ref 78.0–100.0)
Platelets: 147 10*3/uL — ABNORMAL LOW (ref 150–400)
RBC: 3.15 MIL/uL — AB (ref 3.87–5.11)
RDW: 14.4 % (ref 11.5–15.5)
WBC: 10.2 10*3/uL (ref 4.0–10.5)

## 2016-11-25 MED ORDER — INSULIN GLARGINE 100 UNIT/ML ~~LOC~~ SOLN
35.0000 [IU] | Freq: Two times a day (BID) | SUBCUTANEOUS | Status: DC
  Administered 2016-11-25 (×2): 35 [IU] via SUBCUTANEOUS
  Administered 2016-11-26: 17 [IU] via SUBCUTANEOUS
  Administered 2016-11-26: 35 [IU] via SUBCUTANEOUS
  Filled 2016-11-25 (×5): qty 0.35

## 2016-11-25 NOTE — Progress Notes (Addendum)
[facility]  Subjective  - Doing well today.   Objective 119/84 68 98.4 F (36.9 C) (Oral) 18 91%  Intake/Output Summary (Last 24 hours) at 11/25/16 0755 Last data filed at 11/25/16 0725  Gross per 24 hour  Intake          1462.33 ml  Output               80 ml  Net          1382.33 ml    Incision healing well Drain in place left groin out put 50 cc last 24 hours DP/PT doppler signals on Left LE   Assessment/Planning: POD # 2 Procedure: Left external iliac common femoral and profunda endarterectomy with extended profundoplasty, ligation of left superficial femoral artery, left femoral to below-knee popliteal artery bypass with non-reversed greater saphenous vein  2 units PRBC transfused.  HGB 9.5 today CR decreased now 1.85, continue IV hydration  Drin 50 cc output last 24 hours    Past ABI ABI was done 6/17 and it was less than 0.4 bilaterally.  REcent ABI at [facility] 0.2 bilat.  New ABI's pending  Laurence Slate [facility] 11/25/2016 7:55 AM --  Laboratory Lab Results:  Recent Labs  11/24/16 0333 11/24/16 1904 11/25/16 0352  WBC 13.0*  --  10.2  HGB 7.0* 9.4* 9.5*  HCT 21.4* 28.7* 28.8*  PLT 193  --  147*   BMET  Recent Labs  11/24/16 0333 11/25/16 0352  NA 135 132*  K 4.6 4.6  CL 107 103  CO2 20* 20*  GLUCOSE 98 274*  BUN 36* 34*  CREATININE 1.99* 1.85*  CALCIUM 7.5* 7.8*    COAG Lab Results  Component Value Date   INR 1.11 11/22/2016   INR 1.05 11/16/2016   INR 0.9 12/10/2013   No results found for: PTT    Addendum  I have independently interviewed and examined the patient, and I agree with the physician assistant's findings.  Despite R ABI, pt has no evidence of threatened limb.  Will likely need revascularization of R leg once recovered from L leg operation.   [name], MD, FACS [facility] Office: [phone] Pager: [phone]  11/25/2016,  4:40 PM

## 2016-11-25 NOTE — Progress Notes (Signed)
Occupational Therapy Treatment Patient Details Name: [name] MRN: [id number] DOB: August 23, 1955 Today's Date: 11/25/2016    History of present illness Patient is a 61 y/o female s/p left femoral endarterectomy and left femoral to below-knee popliteal artery bypass with non-reversed greater saphenous vein. PMH includes CAD, depression, DM, HTN, HLD, PAD.   OT comments  Pain much improved. Pt demonstrated ability to perform toileting and grooming with min guard assist. Educated in multiple uses of 3 in 1 and in availability of AE for LB bathing and dressing. Instructed in compensatory techniques for LB dressing and safe footwear. Pt is eager to go home  Follow Up Recommendations  Home health OT;Supervision/Assistance - 24 hour    Equipment Recommendations  3 in 1 bedside commode    Recommendations for Other Services      Precautions / Restrictions Precautions Precautions: Fall Precaution Comments: JP drain Restrictions Weight Bearing Restrictions: No       Mobility Bed Mobility Overal bed mobility: Needs Assistance Bed Mobility: Supine to Sit     Supine to sit: Supervision     General bed mobility comments: no physical assist, HOB up  Transfers Overall transfer level: Needs assistance Equipment used: Rolling walker (2 wheeled) Transfers: Sit to/from Stand Sit to Stand: Min guard         General transfer comment: cues for hand placement, practiced from bed, chair and toilet    Balance Overall balance assessment: Needs assistance   Sitting balance-Leahy Scale: Good       Standing balance-Leahy Scale: Poor Standing balance comment: can release grasp of walker in standing, requires walker for ambulation                           ADL either performed or assessed with clinical judgement   ADL Overall ADL's : Needs assistance/impaired     Grooming: Min guard;Standing;Wash/dry hands           Upper Body Dressing : Set  up;Supervision/safety;Sitting       Toilet Transfer: Min guard;Ambulation;RW;Comfort height toilet;Grab bars   Toileting- Clothing Manipulation and Hygiene: Min guard;Sit to/from stand       Functional mobility during ADLs: Min guard;Rolling walker General ADL Comments: educated in use of AE for LB bathing and dressing and in multiple uses of 3 in 1, pt will rely on her daughter for assist     Vision       Perception     Praxis      Cognition Arousal/Alertness: Awake/alert Behavior During Therapy: WFL for tasks assessed/performed Overall Cognitive Status: Within Functional Limits for tasks assessed                                          Exercises     Shoulder Instructions       General Comments      Pertinent Vitals/ Pain       Pain Assessment: 0-10 Pain Score: 4  Pain Location: LLE Pain Descriptors / Indicators: Sore;Operative site guarding;Aching;Grimacing;Guarding Pain Intervention(s): Repositioned;Monitored during session;Limited activity within patient's tolerance  Home Living Family/patient expects to be discharged to:: Private residence                                        Prior  Functioning/Environment              Frequency  Min 2X/week        Progress Toward Goals  OT Goals(current goals can now be found in the care plan section)  Progress towards OT goals: Progressing toward goals  Acute Rehab OT Goals Patient Stated Goal: go home and be active OT Goal Formulation: With patient Time For Goal Achievement: 12/08/16 Potential to Achieve Goals: Good  Plan Discharge plan remains appropriate    Co-evaluation                 AM-PAC PT "6 Clicks" Daily Activity     Outcome Measure   Help from another person eating meals?: None Help from another person taking care of personal grooming?: A Little Help from another person toileting, which includes using toliet, bedpan, or urinal?: A  Little Help from another person bathing (including washing, rinsing, drying)?: A Lot Help from another person to put on and taking off regular upper body clothing?: A Little Help from another person to put on and taking off regular lower body clothing?: A Lot 6 Click Score: 17    End of Session Equipment Utilized During Treatment: Gait belt;Rolling walker  OT Visit Diagnosis: Unsteadiness on feet (R26.81);Other abnormalities of gait and mobility (R26.89);Pain Pain - Right/Left: Left Pain - part of body: Leg   Activity Tolerance Patient limited by fatigue   Patient Left in chair;with call bell/phone within reach   Nurse Communication          Time: 6754-4920 OT Time Calculation (min): 24 min  Charges: OT General Charges $OT Visit: 1 Procedure OT Treatments $Self Care/Home Management : 23-37 mins    Malka So 11/25/2016, 2:02 PM  [phone]

## 2016-11-25 NOTE — Progress Notes (Addendum)
VASCULAR LAB PRELIMINARY  ARTERIAL  ABI completed:    RIGHT    LEFT    PRESSURE WAVEFORM  PRESSURE WAVEFORM  BRACHIAL 136 Triphasic BRACHIAL Unable to obtain due to bandaging Triphasic  DP  Absent DP 61 Monophasic  AT   AT    PT  Absent PT 64 Monophasic  PER   PER    GREAT TOE  Absent GREAT TOE 22 NA    RIGHT LEFT  ABI 0 0.47   There are no discernable waveforms in the right pedal arteries. The left ABI is suggestive of severe arterial insufficiency. The left TBI is abnormal.  Preliminary results discussed with Gerri Lins, PA-C.  11/25/2016 3:02 PM Maudry Mayhew, BS, RVT, RDCS, RDMS

## 2016-11-25 NOTE — Progress Notes (Signed)
PT Cancellation Note  Patient Details Name: [name] MRN: [id number] DOB: 1955-12-17   Cancelled Treatment:    Reason Eval/Treat Not Completed: Other (comment).  Pt reports she has "walked a lot today" and wants to rest right now.  I do know she worked with OT earlier in the day and did well with them.  PT will check back on Monday (11/28/16) if she is still here.   Thanks,    Barbarann Ehlers. [place], [place], DPT [phone]   11/25/2016, 4:55 PM

## 2016-11-26 LAB — CBC
HCT: 29.6 % — ABNORMAL LOW (ref 36.0–46.0)
HEMOGLOBIN: 9.5 g/dL — AB (ref 12.0–15.0)
MCH: 30 pg (ref 26.0–34.0)
MCHC: 32.1 g/dL (ref 30.0–36.0)
MCV: 93.4 fL (ref 78.0–100.0)
Platelets: 170 10*3/uL (ref 150–400)
RBC: 3.17 MIL/uL — AB (ref 3.87–5.11)
RDW: 14.8 % (ref 11.5–15.5)
WBC: 10.2 10*3/uL (ref 4.0–10.5)

## 2016-11-26 LAB — GLUCOSE, CAPILLARY
GLUCOSE-CAPILLARY: 47 mg/dL — AB (ref 65–99)
GLUCOSE-CAPILLARY: 54 mg/dL — AB (ref 65–99)
GLUCOSE-CAPILLARY: 102 mg/dL — AB (ref 65–99)
GLUCOSE-CAPILLARY: 84 mg/dL (ref 65–99)
Glucose-Capillary: 77 mg/dL (ref 65–99)
Glucose-Capillary: 46 mg/dL — ABNORMAL LOW (ref 65–99)
Glucose-Capillary: 47 mg/dL — ABNORMAL LOW (ref 65–99)
Glucose-Capillary: 110 mg/dL — ABNORMAL HIGH (ref 65–99)

## 2016-11-26 LAB — BASIC METABOLIC PANEL
ANION GAP: 7 (ref 5–15)
BUN: 31 mg/dL — AB (ref 6–20)
CHLORIDE: 110 mmol/L (ref 101–111)
CO2: 20 mmol/L — AB (ref 22–32)
Calcium: 8.1 mg/dL — ABNORMAL LOW (ref 8.9–10.3)
Creatinine, Ser: 1.56 mg/dL — ABNORMAL HIGH (ref 0.44–1.00)
GFR calc Af Amer: 40 mL/min — ABNORMAL LOW (ref >60)
GFR calc non Af Amer: 35 mL/min — ABNORMAL LOW (ref >60)
GLUCOSE: 62 mg/dL — AB (ref 65–99)
POTASSIUM: 4.5 mmol/L (ref 3.5–5.1)
SODIUM: 137 mmol/L (ref 135–145)

## 2016-11-26 NOTE — Progress Notes (Addendum)
[facility] [place]  Subjective  - Wants to go home.   Objective (!) 127/51 70 98.5 F (36.9 C) (Oral) 18 95%  Intake/Output Summary (Last 24 hours) at 11/26/16 0828 Last data filed at 11/26/16 0037  Gross per 24 hour  Intake              720 ml  Output             1155 ml  Net             -435 ml    Feet B with active range of motion and sensation grossly intact Left groin soft will d/c drain today 105 cc recorded clear out put, no bloody drainage. DP/PT doppler signals Heart RRR Lungs non labored breathing  ABI completed:    RIGHT    LEFT    PRESSURE WAVEFORM  PRESSURE WAVEFORM  BRACHIAL 136 Triphasic BRACHIAL Unable to obtain due to bandaging Triphasic  DP  Absent DP 61 Monophasic  AT   AT    PT  Absent PT 64 Monophasic  PER   PER    GREAT TOE  Absent GREAT TOE 22 NA    RIGHT LEFT  ABI 0 0.47   There are no discernable waveforms in the right pedal arteries. The left ABI is suggestive of severe arterial insufficiency. The left TBI is abnormal.    Assessment/Planning: POD # 3 Procedure: Left external iliac common femoral and profunda endarterectomy with extended profundoplasty, ligation of left superficial femoral artery, left femoral to below-knee popliteal artery bypass with non-reversed greater saphenous vein   Despite the ABI's she has no sign of right LE ischemia and sensation with active range of motion is intact on the right LE. She will discuss future plans for right LE by pass at her next f/u visit with Dr. Oneida Alar in 2-3 weeks. Ambulation encouraged Pending labs today to f/u on Cr and HGB   [name], [name] 11/26/2016 8:28 AM    Addendum  I have independently interviewed and examined the patient, and I agree with the physician assistant's findings.  Hx of amb given by patient different from what is documented.  D/C drain today.  Amb today.  If no hematoma in L groin by tomorrow, ok to D/C  tomorrow   [name], MD, FACS [facility] [place] Office: [phone] Pager: [phone]  11/26/2016, 9:14 AM   --  Laboratory Lab Results:  Recent Labs  11/24/16 0333 11/24/16 1904 11/25/16 0352  WBC 13.0*  --  10.2  HGB 7.0* 9.4* 9.5*  HCT 21.4* 28.7* 28.8*  PLT 193  --  147*   BMET  Recent Labs  11/24/16 0333 11/25/16 0352  NA 135 132*  K 4.6 4.6  CL 107 103  CO2 20* 20*  GLUCOSE 98 274*  BUN 36* 34*  CREATININE 1.99* 1.85*  CALCIUM 7.5* 7.8*    COAG Lab Results  Component Value Date   INR 1.11 11/22/2016   INR 1.05 11/16/2016   INR 0.9 12/10/2013   No results found for: PTT

## 2016-11-26 NOTE — Progress Notes (Signed)
Patient given two orange juice containers for low blood sugar.

## 2016-11-27 LAB — GLUCOSE, CAPILLARY
GLUCOSE-CAPILLARY: 119 mg/dL — AB (ref 65–99)
GLUCOSE-CAPILLARY: 159 mg/dL — AB (ref 65–99)
Glucose-Capillary: 37 mg/dL — CL (ref 65–99)
Glucose-Capillary: 160 mg/dL — ABNORMAL HIGH (ref 65–99)
Glucose-Capillary: 40 mg/dL — CL (ref 65–99)
Glucose-Capillary: 26 mg/dL — CL (ref 65–99)
Glucose-Capillary: 169 mg/dL — ABNORMAL HIGH (ref 65–99)

## 2016-11-27 MED ORDER — OXYCODONE HCL 5 MG PO TABS
5.0000 mg | ORAL_TABLET | Freq: Four times a day (QID) | ORAL | Status: DC | PRN
  Administered 2016-11-27 – 2016-11-28 (×2): 5 mg via ORAL
  Filled 2016-11-27 (×2): qty 1

## 2016-11-27 MED ORDER — OXYCODONE-ACETAMINOPHEN 5-325 MG PO TABS
1.0000 | ORAL_TABLET | Freq: Four times a day (QID) | ORAL | Status: DC | PRN
  Administered 2016-11-27 – 2016-11-28 (×3): 1 via ORAL
  Filled 2016-11-27 (×3): qty 1

## 2016-11-27 MED ORDER — INSULIN GLARGINE 100 UNIT/ML ~~LOC~~ SOLN: 25.0000 [IU] | Freq: Two times a day (BID) | SUBCUTANEOUS | 11 refills | Status: DC | PRN

## 2016-11-27 MED ORDER — DEXTROSE 50 % IV SOLN
INTRAVENOUS | Status: AC
  Administered 2016-11-27: 25 mL
  Filled 2016-11-27: qty 50

## 2016-11-27 MED ORDER — INSULIN GLARGINE 100 UNIT/ML ~~LOC~~ SOLN
25.0000 [IU] | Freq: Two times a day (BID) | SUBCUTANEOUS | Status: DC
  Filled 2016-11-27 (×2): qty 0.25

## 2016-11-27 MED ORDER — DEXTROSE 50 % IV SOLN
INTRAVENOUS | Status: AC
  Administered 2016-11-27: 50 mL
  Filled 2016-11-27: qty 50

## 2016-11-27 MED ORDER — DEXTROSE-NACL 5-0.45 % IV SOLN
INTRAVENOUS | Status: DC
  Administered 2016-11-27 – 2016-11-28 (×2): via INTRAVENOUS

## 2016-11-27 NOTE — Progress Notes (Signed)
Low cbg treated with dextrose 50% solution, cbg rechecked, now 160.

## 2016-11-27 NOTE — Progress Notes (Signed)
   Daily Progress Note  Reported last BG low again.  Family would prefer to keep pt another day.  - Hold Lantus - D5 1/2 NS @ 75 cc/hr - SSI - Home tomorrow if medically stable  [name], MD, FACS [facility] [place] Office: [phone] Pager: [phone]  11/27/2016, 6:06 PM

## 2016-11-27 NOTE — Progress Notes (Signed)
Spoke with Dr Bridgett Larsson concerning some separation of the edges of pt's thigh incision.  Dr Bridgett Larsson stated to cleanse the area with saline and then place steri strips to the incision.  The incision was cleansed with saline and then steri strips placed to help reinforce the incision.

## 2016-11-27 NOTE — Progress Notes (Addendum)
[facility] [place]  Subjective  - Very tired.  Episode of hypoglycemia yesterday, low PO intake.   Objective (!) 134/45 68 98 F (36.7 C) (Oral) 18 97%  Intake/Output Summary (Last 24 hours) at 11/27/16 0741 Last data filed at 11/26/16 1800  Gross per 24 hour  Intake              720 ml  Output                0 ml  Net              720 ml    DP/PT doppler signals Heart RRR Lungs non labored breathing Feet B with active range of motion and sensation grossly intact Left LE incision healing well, groin soft   Feet B with active range of motion and sensation grossly intact Left groin soft will d/c drain today 105 cc recorded clear out put, no bloody drainage. DP/PT doppler signals Heart RRR Lungs non labored breathing  ABI completed:    RIGHT    LEFT    PRESSURE WAVEFORM  PRESSURE WAVEFORM  BRACHIAL 136 Triphasic BRACHIAL Unable to obtain due to bandaging Triphasic  DP  Absent DP 61 Monophasic  AT   AT    PT  Absent PT 64 Monophasic  PER   PER    GREAT TOE  Absent GREAT TOE 22 NA    RIGHT LEFT  ABI 0 0.47   There are no discernable waveforms in the right pedal arteries. The left ABI is suggestive of severe arterial insufficiency. The left TBI is abnormal.    Assessment/Planning: POD # 4 Procedure: Left external iliac common femoral and profunda endarterectomy with extended profundoplasty, ligation of left superficial femoral artery, left femoral to below-knee popliteal artery bypass with non-reversed greater saphenous vein   Despite the ABI's she has no sign of right LE ischemia and sensation with active range of motion is intact on the right LE. She will discuss future plans for right LE by pass at her next f/u visit with Dr. Oneida Alar in 2-3 weeks. Ambulation encouraged Cr declining, now 1.56 will KVO fluids encourage PO intake HGB stable last 48 hours 9.5 D/C morphine  Patient was on oral pain  medication prior to admission.   Will decrease Lantus to 25 units secondary to low PO intake and hypoglycemia event. Rolling walker ordered for home. Maybe ready for discharge today.       Laurence Slate [facility] 11/27/2016 7:41 AM --  Laboratory Lab Results:  Recent Labs  11/25/16 0352 11/26/16 0835  WBC 10.2 10.2  HGB 9.5* 9.5*  HCT 28.8* 29.6*  PLT 147* 170   BMET  Recent Labs  11/25/16 0352 11/26/16 0835  NA 132* 137  K 4.6 4.5  CL 103 110  CO2 20* 20*  GLUCOSE 274* 62*  BUN 34* 31*  CREATININE 1.85* 1.56*  CALCIUM 7.8* 8.1*    COAG Lab Results  Component Value Date   INR 1.11 11/22/2016   INR 1.05 11/16/2016   INR 0.9 12/10/2013   No results found for: PTT  Addendum  I have independently interviewed and examined the patient, and I agree with the physician assistant's findings.  Pt with stable clinical status.  Suspect her ambulation is going to be limited by R leg's critical limb ischemia.  At this point, I think this patient's care would be better served at home rather than here.  - D/C all breakthrough narcotics - Reduce  DM regimen, given hypoglycemia this AM - D/C home   [name], MD, [facility] [place] Office: [phone] Pager: [phone]  11/27/2016, 9:41 AM

## 2016-11-27 NOTE — Progress Notes (Signed)
CRITICAL VALUE ALERT  Critical value received: CBG 37  Date of notification:  11/27/16 Time of notification:  06:25  Critical value read back:yes  Nurse who received alert:  Aaryan Essman MD notified (1st page):  Time of first page:    MD notified (2nd page):  Time of second page:  Responding MD:   Time MD responded:

## 2016-11-28 LAB — GLUCOSE, CAPILLARY
GLUCOSE-CAPILLARY: 30 mg/dL — AB (ref 65–99)
GLUCOSE-CAPILLARY: 70 mg/dL (ref 65–99)
GLUCOSE-CAPILLARY: 78 mg/dL (ref 65–99)
Glucose-Capillary: 170 mg/dL — ABNORMAL HIGH (ref 65–99)

## 2016-11-28 NOTE — Care Management Important Message (Signed)
Important Message  Patient Details  Name: [name] MRN: [id number] Date of Birth: 1955-10-19   Medicare Important Message Given:  Yes    Nathen May 11/28/2016, 12:59 PM

## 2016-11-28 NOTE — Progress Notes (Addendum)
[facility] [place]  Subjective  - Doing OK this am.  Hypoglycemic events and left upper thigh vein harvest site dehisce.   Objective (!) 130/45 61 98.3 F (36.8 C) (Oral) 17 96%  Intake/Output Summary (Last 24 hours) at 11/28/16 0724 Last data filed at 11/28/16 0634  Gross per 24 hour  Intake             1510 ml  Output             2300 ml  Net             -790 ml    Left LE warm to touch, active range of motion and sensation grossly intact. Groin incision healing well, dry guaze placed in left groin. Wet to dry placed over upper thigh incision, good beefy red base. Heart RRR Lungs non labored breathing  Assessment/Planning: POD # 5 Procedure: Left external iliac common femoral and profunda endarterectomy with extended profundoplasty, ligation of left superficial femoral artery, left femoral to below-knee popliteal artery bypass with non-reversed greater saphenous vein  Despite the ABI's she has no sign of right LE ischemia and sensation with active range of motion is intact on the right LE. She will discuss future plans for right LE by pass at her next f/u visit with Dr. Oneida Alar in 2-3 weeks. Good UO Cr declining  Hypoglycemic events will hold Lantus and she will check her glucose levels at home and re adjust her medication. Will order [facility] for dressing changes and glucose checks.   Laurence Slate [facility] 11/28/2016 7:24 AM --  Laboratory Lab Results:  Recent Labs  11/26/16 0835  WBC 10.2  HGB 9.5*  HCT 29.6*  PLT 170   BMET  Recent Labs  11/26/16 0835  NA 137  K 4.5  CL 110  CO2 20*  GLUCOSE 62*  BUN 31*  CREATININE 1.56*  CALCIUM 8.1*   I have interviewed the patient and examined the patient. I agree with the findings by the PA. I think the wound dehiscence in the proximal left thigh would best be addressed with dressing changes rather than reclosing the wound. The deep layers are intact. Okay for discharge today of home health  dressing changes can be arranged.  Gae Gallop, MD [phone]

## 2016-11-28 NOTE — Progress Notes (Signed)
Physical Therapy Treatment Patient Details Name: [name] MRN: [id number] DOB: 03-15-1956 Today's Date: 11/28/2016    History of Present Illness Patient is a 61 y/o female s/p left femoral endarterectomy and left femoral to below-knee popliteal artery bypass with non-reversed greater saphenous vein. PMH includes CAD, depression, DM, HTN, HLD, PAD.    PT Comments    Pt very pleasant and demonstrating increased activity tolerance and strength. Pt with progressive increased heel strike LLE with gait and educated for HEP, gait and progression with pt able to perform stairs sideways.      Follow Up Recommendations  Home health PT;Supervision for mobility/OOB;Supervision/Assistance - 24 hour     Equipment Recommendations  Rolling walker with 5" wheels    Recommendations for Other Services       Precautions / Restrictions Precautions Precautions: Fall Restrictions Weight Bearing Restrictions: No Other Position/Activity Restrictions: cues to avoid walking on L toe    Mobility  Bed Mobility Overal bed mobility: Needs Assistance Bed Mobility: Supine to Sit     Supine to sit: Min guard     General bed mobility comments: with assist of rail able to transfer to EOB with HOB flat  Transfers Overall transfer level: Needs assistance Equipment used: Rolling walker (2 wheeled) Transfers: Sit to/from Stand Sit to Stand: Supervision         General transfer comment: cues for hand placement, practiced from bed and BSC  Ambulation/Gait Ambulation/Gait assistance: Min guard Ambulation Distance (Feet): 150 Feet Assistive device: Rolling walker (2 wheeled) Gait Pattern/deviations: Step-through pattern;Decreased stride length;Decreased dorsiflexion - left   Gait velocity interpretation: Below normal speed for age/gender General Gait Details: cues for heel strike on LLE, cues for posture and position in RW. pt walked 150' then 15'   Stairs Stairs: Yes   Stair Management:  Step to pattern;Sideways;One rail Left Number of Stairs: 3 General stair comments: cues for sequence with pt able to perform without assist  Wheelchair Mobility    Modified Rankin (Stroke Patients Only)       Balance Overall balance assessment: Needs assistance   Sitting balance-Leahy Scale: Good       Standing balance-Leahy Scale: Fair                              Cognition Arousal/Alertness: Awake/alert Behavior During Therapy: WFL for tasks assessed/performed Overall Cognitive Status: Within Functional Limits for tasks assessed                                        Exercises General Exercises - Lower Extremity Long Arc Quad: AROM;Left;Seated;15 reps Hip Flexion/Marching: AROM;15 reps;Left;Seated Toe Raises: AROM;15 reps;Left;Seated    General Comments        Pertinent Vitals/Pain Pain Assessment: Faces Pain Score: 4  Faces Pain Scale: Hurts a little bit Pain Location: LLE Pain Descriptors / Indicators: Sore Pain Intervention(s): Limited activity within patient's tolerance;Repositioned    Home Living                      Prior Function            PT Goals (current goals can now be found in the care plan section) Acute Rehab PT Goals Patient Stated Goal: go home and be active Progress towards PT goals: Progressing toward goals    Frequency  PT Plan Current plan remains appropriate    Co-evaluation              AM-PAC PT "6 Clicks" Daily Activity  Outcome Measure  Difficulty turning over in bed (including adjusting bedclothes, sheets and blankets)?: A Little Difficulty moving from lying on back to sitting on the side of the bed? : Total Difficulty sitting down on and standing up from a chair with arms (e.g., wheelchair, bedside commode, etc,.)?: A Little Help needed moving to and from a bed to chair (including a wheelchair)?: A Little Help needed walking in hospital room?: A Little Help  needed climbing 3-5 steps with a railing? : A Little 6 Click Score: 16    End of Session Equipment Utilized During Treatment: Gait belt Activity Tolerance: Patient tolerated treatment well Patient left: in chair;with call bell/phone within reach Nurse Communication: Mobility status       Time: 0950-1016 PT Time Calculation (min) (ACUTE ONLY): 26 min  Charges:  $Gait Training: 8-22 mins $Therapeutic Exercise: 8-22 mins                    G Codes:       Elwyn Reach, [place]   [place] 11/28/2016, 11:46 AM

## 2016-11-28 NOTE — Care Management Note (Signed)
Case Management Note Previous CM note initiated by Zenon Mayo, RN--11/24/2016, 4:18 PM   Patient Details  Name: [name] MRN: [id number] Date of Birth: [birth year]  Subjective/Objective:   From home alone, pta indep, her daughter Thermon Leyland [phone] will be staying with her at discharge.  Per pt /ot eval rec HHPT/HHOT.  Patient chose [facility] from [facility] [place] list.  Referral given to Butch Penny with [facility].  Soc will begin 24 48 hrs post dc.      PCP listed is Nicholes Rough              Action/Plan: NCM will follow for dc needsl  Expected Discharge Date:  11/28/16               Expected Discharge Plan:  [place]  In-House Referral:     Discharge planning Services  CM Consult  Post Acute Care Choice:  Home Health Choice offered to:  Patient  DME Arranged:  3-N-1, Walker rolling DME Agency:  Manuel Garcia:  PT, OT, RN [facility] Agency:  [place]  Status of Service:  Completed, signed off  If discussed at [place] of Stay Meetings, dates discussed:    Discharge Disposition: home with home health   Additional Comments:  11/28/16- 1000- Marlee Armenteros RN, CM- pt for d/c home today- [facility] referral already made to [facility]- have added HHRN to previous orders for wound care/drsg changes- have notified Santiago Glad with [facility] regarding this addition. DME has already been delivered to room for discharge.   Dahlia Client Crary, RN 11/28/2016, 10:00 AM [phone]

## 2016-11-28 NOTE — Progress Notes (Signed)
Occupational Therapy Treatment Patient Details Name: [name] MRN: [id number] DOB: 11-07-1955 Today's Date: 11/28/2016    History of present illness Patient is a 61 y/o female s/p left femoral endarterectomy and left femoral to below-knee popliteal artery bypass with non-reversed greater saphenous vein. PMH includes CAD, depression, DM, HTN, HLD, PAD.   OT comments  Pt sleepy after having been awake and in chair since 6 am. Pt requiring supervision for mobility, toileting and standing grooming and moderate assist for LB dressing. She has assistance of her family at home. Pt is eager to go home.  Follow Up Recommendations  Home health OT;Supervision/Assistance - 24 hour    Equipment Recommendations  3 in 1 bedside commode    Recommendations for Other Services      Precautions / Restrictions Precautions Precautions: Fall       Mobility Bed Mobility Overal bed mobility: Needs Assistance Bed Mobility: Sit to Supine     Supine to sit: Supervision        Transfers Overall transfer level: Needs assistance Equipment used: Rolling walker (2 wheeled) Transfers: Sit to/from Stand Sit to Stand: Supervision              Balance                                           ADL either performed or assessed with clinical judgement   ADL Overall ADL's : Needs assistance/impaired     Grooming: Supervision/safety;Standing;Wash/dry hands           Upper Body Dressing : Set up;Supervision/safety;Sitting   Lower Body Dressing: Moderate assistance;Sit to/from stand   Toilet Transfer: Supervision/safety;RW;Ambulation;Comfort height toilet   Toileting- Clothing Manipulation and Hygiene: Supervision/safety;Sit to/from stand       Functional mobility during ADLs: Supervision/safety;Rolling walker       Vision       Perception     Praxis      Cognition Arousal/Alertness: Awake/alert Behavior During Therapy: WFL for tasks  assessed/performed Overall Cognitive Status: Within Functional Limits for tasks assessed                                          Exercises     Shoulder Instructions       General Comments      Pertinent Vitals/ Pain       Pain Assessment: Faces Faces Pain Scale: Hurts a little bit Pain Location: L foot Pain Descriptors / Indicators: Sore Pain Intervention(s): Repositioned  Home Living                                          Prior Functioning/Environment              Frequency  Min 2X/week        Progress Toward Goals  OT Goals(current goals can now be found in the care plan section)  Progress towards OT goals: Progressing toward goals  Acute Rehab OT Goals Patient Stated Goal: go home and be active OT Goal Formulation: With patient Time For Goal Achievement: 12/08/16 Potential to Achieve Goals: Good  Plan Discharge plan remains appropriate    Co-evaluation  AM-PAC PT "6 Clicks" Daily Activity     Outcome Measure   Help from another person eating meals?: None Help from another person taking care of personal grooming?: A Little Help from another person toileting, which includes using toliet, bedpan, or urinal?: A Little Help from another person bathing (including washing, rinsing, drying)?: A Lot Help from another person to put on and taking off regular upper body clothing?: None Help from another person to put on and taking off regular lower body clothing?: Total 6 Click Score: 17    End of Session Equipment Utilized During Treatment: Gait belt;Rolling walker  OT Visit Diagnosis: Unsteadiness on feet (R26.81);Other abnormalities of gait and mobility (R26.89);Pain Pain - Right/Left: Left Pain - part of body: Ankle and joints of foot   Activity Tolerance Patient tolerated treatment well   Patient Left in bed;with call bell/phone within reach   Nurse Communication          Time:  0920-0932 OT Time Calculation (min): 12 min  Charges: OT General Charges $OT Visit: 1 Procedure OT Treatments $Self Care/Home Management : 8-22 mins   Malka So 11/28/2016, 9:56 AM  [phone]

## 2016-11-28 NOTE — Progress Notes (Signed)
Pt discharged home with daughter via wheelchair. BSC and walker given to patient upon discharge. HHPT, OT and RN set up by case management. Pt and daughter aware that she will need to obtain pain scripts from the pain clinic that patient goes to. Follow up appointment and AVS discussed with and given to pt and daughter with understanding verbalized.

## 2016-11-29 ENCOUNTER — Telehealth: Payer: Self-pay | Admitting: *Deleted

## 2016-11-29 ENCOUNTER — Telehealth: Payer: Self-pay | Admitting: Cardiovascular Disease

## 2016-11-29 ENCOUNTER — Encounter: Payer: Self-pay | Admitting: Family

## 2016-11-29 NOTE — Telephone Encounter (Signed)
Received call from [facility], nurse with [place] stating that patient had an upper left groin incision to open.  Denies fever, warmth; however, clear  drainage is noted.  [name] is placing a 2x2 moistened with saline over area.  An appointment was scheduled for tomorrow, Nov 30, 2016 at 4:00 to see the NP.

## 2016-11-29 NOTE — Telephone Encounter (Signed)
I spoke with [name] and she contacted our office to discuss concerns about the pt's vascular surgery site.  I made her aware that she will need to contact Dr Nona Dell office to address these issues.  [name] agreed and will call [facility].

## 2016-11-29 NOTE — Telephone Encounter (Signed)
New message    [name] is calling because pt had procedure on 5/16 and one of the wounds if open a little bit. She said it is draining some. She is calling for orders.

## 2016-11-30 ENCOUNTER — Ambulatory Visit: Payer: Medicare Other | Admitting: Family

## 2016-12-02 NOTE — Discharge Summary (Signed)
[facility] Discharge Summary   Patient ID:  [name] MRN: [id number] DOB/AGE: December 17, 1955 61 y.o.  Admit date: 11/23/2016 Discharge date: 11/28/2016 Date of Surgery: 11/23/2016 Surgeon: Surgeon(s): Elam Dutch, MD  Admission Diagnosis: Peripheral Vascular Disease with nohealing ulcer left foot  I70.244  Discharge Diagnoses:  Peripheral Vascular Disease with nohealing ulcer left foot  I70.244  Secondary Diagnoses: Past Medical History:  Diagnosis Date  . Adenomatous polyp of colon   . Anemia   . Arthritis   . Asthma   . Carpal tunnel syndrome   . Chronic renal insufficiency   . Coronary artery disease   . Depression   . Depression with anxiety   . Diabetes mellitus    type II  . Diverticulosis of colon (without mention of hemorrhage)   . GERD (gastroesophageal reflux disease)   . Heart murmur   . History of hiatal hernia   . Hyperlipidemia   . Hypertension   . Hypothyroidism   . Obesity   . PVD (peripheral vascular disease) ([place])   . Thyroid disease    hypo    Procedure(s): ENDARTERECTOMY OF LEFT EXTERNAL COMMON FEMORAL ARTERY WITH EXTENDED LEFT PROFUNDOPLASTY LEFT FEMORAL-BELOW KNEE POPLITEAL ARTERY BYPASS  Discharged Condition: good  HPI: History of Present Illness: 61 y/o female with left heel ulcer since Nov. 2017.  She has been going to the wound center with out success.  She also reports symptoms of claudication at 20 feet for over a year.   She has also reported rest pain that wakes her up 3-4 times a week.   She underwent agram today by Dr Fletcher Anon which showed left common femoral stenosis and left SFA above knee pop occlusion with 3 vessel runoff but primary left PT.  She has similar findings in right leg but no open wound. ABI was done 6/17 and it was less than 0.4 bilaterally.  REcent ABI at [facility] [facility] center 0.2 bilat.              Past medical history: CAD s/p by pass right leg vein harvest, PAD,carotid stenosis followed by  Cardiologist, CKD Cr 1.8, HTN and hyperlipidemia.  She is medically managed with a statin, beta blocker, aspirin and pletal daily.  Left heel wound not healing with conservative measures.  Severe PAD on agram. Agree with above.  Will plan for left femoral endarterectomy with left fem below knee pop bypass next Wednesday 11/23/16.  Hospital Course:  [name] is a 61 y.o. female is S/P   Procedure(s): ENDARTERECTOMY OF LEFT EXTERNAL COMMON FEMORAL ARTERY WITH EXTENDED LEFT PROFUNDOPLASTY LEFT FEMORAL-BELOW KNEE POPLITEAL ARTERY BYPASS 2 units PRBC transfused.  HGB 9.5 today CR decreased now 1.85, continue IV hydration  Drin 50 cc output last 24 hours    Past ABI ABI was done 6/17and it was less than 0.4bilaterally. REcent ABI at [facility] [facility] center 0.2 bilat.          PRESSURE WAVEFORM  PRESSURE WAVEFORM  BRACHIAL 136 Triphasic BRACHIAL Unable to obtain due to bandaging Triphasic  DP  Absent DP 61 Monophasic  AT   AT    PT  Absent PT 64 Monophasic  PER   PER    GREAT TOE  Absent GREAT TOE 22 NA    RIGHT LEFT  ABI 0 0.47     Despite R ABI, pt has no evidence of threatened limb.  Will likely need revascularization of R leg once recovered from L leg operation. Several hypoglycemic  events CBG 60's.  Lantus was discontinued.  She will follow her blood glucose levels at home and re adjust her medication.  Her Cr decreased to 1.56 and she was tolerating PO's well.  Good urine output, ambulating with rolling walker.  POD# 5 her most proximal vein harvest site dehisced superficially.  We will order [facility] RN for wound checks and glucose checks.  Wound care hydro gel wet to dry daily.  F/U with Dr. Oneida Alar in 2 weeks.  Significant Diagnostic Studies: CBC Lab Results  Component Value Date   WBC 10.2 11/26/2016   HGB 9.5 (L) 11/26/2016   HCT 29.6 (L) 11/26/2016   MCV 93.4 11/26/2016   PLT 170 11/26/2016    BMET    Component Value Date/Time   NA 137 11/26/2016  0835   NA 135 11/18/2016 1219   K 4.5 11/26/2016 0835   CL 110 11/26/2016 0835   CO2 20 (L) 11/26/2016 0835   GLUCOSE 62 (L) 11/26/2016 0835   BUN 31 (H) 11/26/2016 0835   BUN 38 (H) 11/18/2016 1219   CREATININE 1.56 (H) 11/26/2016 0835   CALCIUM 8.1 (L) 11/26/2016 0835   GFRNONAA 35 (L) 11/26/2016 0835   GFRAA 40 (L) 11/26/2016 0835   COAG Lab Results  Component Value Date   INR 1.11 11/22/2016   INR 1.05 11/16/2016   INR 0.9 12/10/2013     Disposition:  Discharge to :Home Discharge Instructions    Call MD for:  redness, tenderness, or signs of infection (pain, swelling, bleeding, redness, odor or green/yellow discharge around incision site)    Complete by:  As directed    Call MD for:  severe or increased pain, loss or decreased feeling  in affected limb(s)    Complete by:  As directed    Call MD for:  temperature >100.5    Complete by:  As directed    Discharge instructions    Complete by:  As directed    You may shower daily.  Check glucose regularrly.  Decrease insulin Lantus to 25 units twice a day until your PO intake of food increases.  Adjust Lantus as needed.   Driving Restrictions    Complete by:  As directed    No driving for 1 week   Lifting restrictions    Complete by:  As directed    No heavy  lifting for 3 weeks   Resume previous diet    Complete by:  As directed      Allergies as of 11/28/2016   No Known Allergies     Medication List    STOP taking these medications   insulin glargine 100 UNIT/ML injection Commonly known as:  LANTUS     TAKE these medications   albuterol 108 (90 Base) MCG/ACT inhaler Commonly known as:  PROVENTIL HFA;VENTOLIN HFA Inhale 2 puffs into the lungs every 6 (six) hours as needed for wheezing or shortness of breath.   ALPRAZolam 1 MG tablet Commonly known as:  XANAX Take 1 mg by mouth 2 (two) times daily as needed for anxiety.   amLODipine 10 MG tablet Commonly known as:  NORVASC Take 10 mg by mouth at  bedtime.   aspirin EC 81 MG tablet Take 81 mg by mouth at bedtime.   atorvastatin 80 MG tablet Commonly known as:  LIPITOR Take 80 mg by mouth daily.   B-D INS SYR ULTRAFINE 1CC/31G 31G X 5/16" 1 ML Misc Generic drug:  Insulin Syringe-Needle U-100 USE NEW NEEDLE FOR MEAL  TIME 3 TIMES A DAY AND LONG ACTING INSULIN TWICE A DAY   cilostazol 100 MG tablet Commonly known as:  PLETAL Take 100 mg by mouth 2 (two) times daily.   fenofibrate micronized 200 MG capsule Commonly known as:  LOFIBRA Take 200 mg by mouth at bedtime.   ferrous sulfate 325 (65 FE) MG tablet Take 325 mg by mouth 3 (three) times daily.   Fish Oil 1000 MG Caps Take 1,000 mg by mouth 2 (two) times daily.   furosemide 40 MG tablet Commonly known as:  LASIX Take 40 mg by mouth 2 (two) times daily.   levothyroxine 175 MCG tablet Commonly known as:  SYNTHROID, LEVOTHROID Take 175 mcg by mouth daily before breakfast.   metoprolol tartrate 100 MG tablet Commonly known as:  LOPRESSOR Take 100 mg by mouth 2 (two) times daily. Takes 100 mg and 50 mg to equal 150 mg twice daily   metoprolol tartrate 50 MG tablet Commonly known as:  LOPRESSOR Take 50 mg by mouth 2 (two) times daily. Takes 100 mg and a 50 mg to equal 150 mg twice daily   omeprazole 40 MG capsule Commonly known as:  PRILOSEC Take 40 mg by mouth daily.   ONETOUCH VERIO test strip Generic drug:  glucose blood CHECK SUGAR AT HOME 4-5 TIMES A DAY   oxyCODONE 15 mg 12 hr tablet Commonly known as:  OXYCONTIN Take 15 mg by mouth every 12 (twelve) hours.   oxyCODONE-acetaminophen 10-325 MG tablet Commonly known as:  PERCOCET Take 1 tablet by mouth 3 (three) times daily.   PARoxetine 40 MG tablet Commonly known as:  PAXIL Take 40 mg by mouth at bedtime.   promethazine 25 MG tablet Commonly known as:  PHENERGAN Take 25 mg by mouth daily as needed for nausea or vomiting.   Vitamin D (Ergocalciferol) 50000 units Caps capsule Commonly known as:   DRISDOL Take 50,000 Units by mouth once a week. mondays      Verbal and written Discharge instructions given to the patient. Wound care per Discharge AVS Follow-up Information    [facility] Follow up.   Why:  HHPT/OT/RN ,  3n1 and rolling walker  Contact information: [place] [address] [phone]           Signed: [facility]rence [facility] 12/02/2016, 1:32 PM - For [facility] use --- Instructions: Press F2 to tab through selections.  Delete question if not applicable.   Post-op:  Wound infection: No  Graft infection: No  Transfusion: Yes  If yes, 2 units given New Arrhythmia: No Ipsilateral amputation: [x ] no, [ ]  Minor, [ ]  BKA, [ ]  AKA Discharge patency: [x ] Primary, [ ]  Primary assisted, [ ]  Secondary, [ ]  Occluded Patency judged by: [x ] Dopper only, [ ]  Palpable graft pulse, [ ]  Palpable distal pulse, [ ]  ABI inc. > 0.15, [ ]  Duplex Discharge ABI: R 0, L 0.47 Discharge TBI: R 0, L 22 D/C Ambulatory Status: Ambulatory with Assistance  Complications: MI: [x ] No, [ ]  Troponin only, [ ]  EKG or Clinical CHF: No Resp failure: [x ] none, [ ]  Pneumonia, [ ]  Ventilator Chg in renal function: [ ]  none, [x ] Inc. Cr > 0.5, [ ]  Temp. Dialysis, [ ]  Permanent dialysis Stroke: [x ] None, [ ]  Minor, [ ]  Major Return to OR: No  Reason for return to OR: [ ]  Bleeding, [ ]  Infection, [ ]  Thrombosis, [ ]  Revision  Discharge medications: Statin use:  Yes ASA use:  Yes Plavix use:  No  for medical reason   Beta blocker use: Yes Coumadin use: No  for medical reason

## 2016-12-09 ENCOUNTER — Encounter: Payer: Self-pay | Admitting: Family

## 2016-12-09 ENCOUNTER — Ambulatory Visit (INDEPENDENT_AMBULATORY_CARE_PROVIDER_SITE_OTHER): Payer: Self-pay | Admitting: Family

## 2016-12-09 VITALS — BP 148/71 | HR 55 | Resp 18 | Ht 63.0 in | Wt 167.3 lb

## 2016-12-09 DIAGNOSIS — I771 Stricture of artery: Secondary | ICD-10-CM

## 2016-12-09 DIAGNOSIS — I779 Disorder of arteries and arterioles, unspecified: Secondary | ICD-10-CM

## 2016-12-09 DIAGNOSIS — Z87891 Personal history of nicotine dependence: Secondary | ICD-10-CM

## 2016-12-09 DIAGNOSIS — Z95828 Presence of other vascular implants and grafts: Secondary | ICD-10-CM

## 2016-12-09 NOTE — Patient Instructions (Signed)
To reduce swelling in your left foot and lower leg: Elevate feet above slightly flexed knees, feet above heart, overnight and 3-4x/day for 20 minutes.    Peripheral Vascular Disease Peripheral vascular disease (PVD) is a disease of the blood vessels that are not part of your heart and brain. A simple term for PVD is poor circulation. In most cases, PVD narrows the blood vessels that carry blood from your heart to the rest of your body. This can result in a decreased supply of blood to your arms, legs, and internal organs, like your stomach or kidneys. However, it most often affects a person's lower legs and feet. There are two types of PVD.  Organic PVD. This is the more common type. It is caused by damage to the structure of blood vessels.  Functional PVD. This is caused by conditions that make blood vessels contract and tighten (spasm).  Without treatment, PVD tends to get worse over time. PVD can also lead to acute ischemic limb. This is when an arm or limb suddenly has trouble getting enough blood. This is a medical emergency. Follow these instructions at home:  Take medicines only as told by your doctor.  Do not use any tobacco products, including cigarettes, chewing tobacco, or electronic cigarettes. If you need help quitting, ask your doctor.  Lose weight if you are overweight, and maintain a healthy weight as told by your doctor.  Eat a diet that is low in fat and cholesterol. If you need help, ask your doctor.  Exercise regularly. Ask your doctor for some good activities for you.  Take good care of your feet. ? Wear comfortable shoes that fit well. ? Check your feet often for any cuts or sores. Contact a doctor if:  You have cramps in your legs while walking.  You have leg pain when you are at rest.  You have coldness in a leg or foot.  Your skin changes.  You are unable to get or have an erection (erectile dysfunction).  You have cuts or sores on your feet that are  not healing. Get help right away if:  Your arm or leg turns cold and blue.  Your arms or legs become red, warm, swollen, painful, or numb.  You have chest pain or trouble breathing.  You suddenly have weakness in your face, arm, or leg.  You become very confused or you cannot speak.  You suddenly have a very bad headache.  You suddenly cannot see. This information is not intended to replace advice given to you by your health care provider. Make sure you discuss any questions you have with your health care provider. Document Released: 09/21/2009 Document Revised: 12/03/2015 Document Reviewed: 12/05/2013 Elsevier Interactive Patient Education  2017 [employer].

## 2016-12-09 NOTE — Progress Notes (Signed)
Postoperative Visit   History of Present Illness  [name] is a 61 y.o. year old female who is s/p Left external iliac common femoral and profunda endarterectomy with extended profundoplasty, ligation of left superficial femoral artery, left femoral to below-knee popliteal artery bypass with non-reversed greater saphenous vein on 11-23-16 by Dr. Oneida Alar for nonhealing wound left foot.  Findings: Severe calcific arterial occlusive disease. Greater saphenous vein 2-1/2-3 mm diameter.  Pt returns today after received call from [facility], nurse with [place] on 11-29-16 stating that patient had an upper left groin incision to open.  Denies fever, warmth; however, clear  drainage is noted.  [name] is placing a 2x2 moistened with saline over area.  An appointment was scheduled for tomorrow, Nov 30, 2016, but pt missed that appointment and presents today.  She denies fever or chills. She states [place] advised her to do daily wet to dry packing and dressing changes to open incision left upper medial thigh, and betadine daily dressing change with padding to left heal granulating wound, and states she has been doing this.    She has DM, last A1C result on file was 7.2 on 11-22-16.  She is a former smoker, quit in 2004.  She has no scheduled follow up with Dr. Oneida Alar yet. She takes Lantus basal insulin, 55 units bid. She takes no rapid acting meal coverage insulin, no oral agents for her DM.   Pt states that her left calf and foot claudication have totally resolved, the night time rest pain has also resolved.    The patient is able to complete their activities of daily living.    For VQI Use Only  PRE-ADM LIVING: Home  AMB STATUS: Ambulatory   Past Medical History:  Diagnosis Date  . Adenomatous polyp of colon   . Anemia   . Arthritis   . Asthma   . Carpal tunnel syndrome   . Chronic renal insufficiency   . Coronary artery disease   . Depression   . Depression with anxiety     . Diabetes mellitus    type II  . Diverticulosis of colon (without mention of hemorrhage)   . GERD (gastroesophageal reflux disease)   . Heart murmur   . History of hiatal hernia   . Hyperlipidemia   . Hypertension   . Hypothyroidism   . Obesity   . PVD (peripheral vascular disease) ([place])   . Thyroid disease    hypo    Past Surgical History:  Procedure Laterality Date  . ABDOMINAL AORTAGRAM N/A 12/18/2013   Procedure: ABDOMINAL [name];  Surgeon: Wellington Hampshire, MD;  Location: [facility] CATH LAB;  Service: Cardiovascular;  Laterality: N/A;  . ABDOMINAL AORTOGRAM W/LOWER EXTREMITY N/A 11/16/2016   Procedure: Abdominal Aortogram w/Lower Extremity;  Surgeon: Wellington Hampshire, MD;  Location: [facility] CV LAB;  Service: Cardiovascular;  Laterality: N/A;  . ABDOMINAL HYSTERECTOMY    . APPENDECTOMY    . CHOLECYSTECTOMY    . CORONARY ARTERY BYPASS GRAFT  2004   x4  . ENDARTERECTOMY FEMORAL Left 11/23/2016   Procedure: ENDARTERECTOMY OF LEFT EXTERNAL COMMON FEMORAL ARTERY WITH EXTENDED LEFT PROFUNDOPLASTY;  Surgeon: Elam Dutch, MD;  Location: [facility];  Service: Vascular;  Laterality: Left;  . FEMORAL-POPLITEAL BYPASS GRAFT Left 11/23/2016   Procedure: LEFT FEMORAL-BELOW KNEE POPLITEAL ARTERY BYPASS;  Surgeon: Elam Dutch, MD;  Location: [facility];  Service: Vascular;  Laterality: Left;  . KNEE SURGERY Right 2013   arthroscopy  Social History   Social History  . Marital status: Widowed    Spouse name: N/A  . Number of children: 2  . Years of education: N/A   Occupational History  . [employer]    retired    Social History Main Topics  . Smoking status: Former Smoker    Quit date: 2004  . Smokeless tobacco: Never Used  . Alcohol use No  . Drug use: No  . Sexual activity: Not on file   Other Topics Concern  . Not on file   Social History Narrative   Daily Caffeine    No Known Allergies  Current Outpatient Prescriptions on File Prior to  Visit  Medication Sig Dispense Refill  . albuterol (PROVENTIL HFA;VENTOLIN HFA) 108 (90 BASE) MCG/ACT inhaler Inhale 2 puffs into the lungs every 6 (six) hours as needed for wheezing or shortness of breath.     . ALPRAZolam (XANAX) 1 MG tablet Take 1 mg by mouth 2 (two) times daily as needed for anxiety.     Marland Kitchen amLODipine (NORVASC) 10 MG tablet Take 10 mg by mouth at bedtime.     Marland Kitchen aspirin EC 81 MG tablet Take 81 mg by mouth at bedtime.    Marland Kitchen atorvastatin (LIPITOR) 80 MG tablet Take 80 mg by mouth daily.      . B-D INS SYR ULTRAFINE 1CC/31G 31G X 5/16" 1 ML MISC USE NEW NEEDLE FOR MEAL TIME 3 TIMES A DAY AND LONG ACTING INSULIN TWICE A DAY  11  . cilostazol (PLETAL) 100 MG tablet Take 100 mg by mouth 2 (two) times daily.    . fenofibrate micronized (LOFIBRA) 200 MG capsule Take 200 mg by mouth at bedtime.     . ferrous sulfate 325 (65 FE) MG tablet Take 325 mg by mouth 3 (three) times daily.  3  . furosemide (LASIX) 40 MG tablet Take 40 mg by mouth 2 (two) times daily.     Marland Kitchen levothyroxine (SYNTHROID, LEVOTHROID) 175 MCG tablet Take 175 mcg by mouth daily before breakfast.     . metoprolol (LOPRESSOR) 100 MG tablet Take 100 mg by mouth 2 (two) times daily. Takes 100 mg and 50 mg to equal 150 mg twice daily    . metoprolol (LOPRESSOR) 50 MG tablet Take 50 mg by mouth 2 (two) times daily. Takes 100 mg and a 50 mg to equal 150 mg twice daily  6  . Omega-3 Fatty Acids (FISH OIL) 1000 MG CAPS Take 1,000 mg by mouth 2 (two) times daily.    Marland Kitchen omeprazole (PRILOSEC) 40 MG capsule Take 40 mg by mouth daily.    Glory Rosebush VERIO test strip CHECK SUGAR AT HOME 4-5 TIMES A DAY  11  . oxyCODONE (OXYCONTIN) 15 mg 12 hr tablet Take 15 mg by mouth every 12 (twelve) hours.    Marland Kitchen oxyCODONE-acetaminophen (PERCOCET) 10-325 MG tablet Take 1 tablet by mouth 3 (three) times daily.     Marland Kitchen PARoxetine (PAXIL) 40 MG tablet Take 40 mg by mouth at bedtime.     . promethazine (PHENERGAN) 25 MG tablet Take 25 mg by mouth daily as  needed for nausea or vomiting.   6  . Vitamin D, Ergocalciferol, (DRISDOL) 50000 UNITS CAPS capsule Take 50,000 Units by mouth once a week. mondays  1   No current facility-administered medications on file prior to visit.      Physical Examination  Vitals:   12/09/16 0948 12/09/16 0952  BP: (!) 167/62 Marland Kitchen)  148/71  Pulse: 61 (!) 55  Resp: 18   SpO2: 98%   Weight: 167 lb 5 oz (75.9 kg)   Height: 5\' 3"  (1.6 m)    Body mass index is 29.64 kg/m.  Left pedal pulses: 1-2+ palpable DP with brisk Doppler signal, +brisk Doppler signal left PT.  Left heal wound is granulating well and contracting. Left upper medial thigh incision separation is 5 cm in length, 0.5 cm at the widest separation, and 0.2 cm in depth; base of incision separation with beefy red healthy granulating tissue. This is draining thin watery material. No signs of infection.  Left medial calf incision is healing well, incision edges well proximated, no signs of infection. Left heel ulcer is in the final stages of granulation, edges of ulcer with evidence of contraction.  Left foot and lower leg with 1+ non pitting edema.   Medical Decision Making  ELLAH OTTE is a 61 y.o. year old female who presents s/p Left external iliac common femoral and profunda endarterectomy with extended profundoplasty, ligation of left superficial femoral artery, left femoral to below-knee popliteal artery bypass with non-reversed greater saphenous vein on 11-23-16 by Dr. Oneida Alar for nonhealing wound left foot.    Dr. Donzetta Matters spoke with and examined pt   The patient's bypass incisions are healing appropriately with resolution of pre-operative symptoms.  She was advised that she may shower daily.   Continue daily dressing changes to granulating left upper medial thigh open incision and granulating left heal wound.   No follow up with Dr. Oneida Alar has been scheduled yet: follow up in 3-4 weeks with ABI's and left LE arterial duplex, see Dr. Oneida Alar  afterward.   I discussed in depth with the patient the nature of atherosclerosis, and emphasized the importance of maximal medical management including strict control of blood pressure, blood glucose, and lipid levels, obtaining regular exercise, and cessation of smoking.  The patient is aware that without maximal medical management the underlying atherosclerotic disease process will progress, limiting the benefit of any interventions. I emphasized the importance of routine surveillance of the patient's bypass, as the vascular surgery literature emphasize the improved patency possible with assisted primary patency procedures versus secondary patency procedures. The patient agrees to participate in their maximal medical care and routine surveillance.  Thank you for allowing [place] to participate in this patient's care.  NICKEL, Sharmon Leyden, RN, MSN, FNP-C [facility] [place] Office: [phone]  12/09/2016, 10:17 AM  Clinic MD: Chen/Cain

## 2016-12-20 ENCOUNTER — Ambulatory Visit (INDEPENDENT_AMBULATORY_CARE_PROVIDER_SITE_OTHER): Payer: Medicare Other | Admitting: Cardiovascular Disease

## 2016-12-20 VITALS — BP 98/50 | HR 59 | Ht 63.0 in | Wt 164.4 lb

## 2016-12-20 DIAGNOSIS — I10 Essential (primary) hypertension: Secondary | ICD-10-CM | POA: Diagnosis not present

## 2016-12-20 DIAGNOSIS — I2510 Atherosclerotic heart disease of native coronary artery without angina pectoris: Secondary | ICD-10-CM

## 2016-12-20 DIAGNOSIS — I739 Peripheral vascular disease, unspecified: Secondary | ICD-10-CM

## 2016-12-20 DIAGNOSIS — I779 Disorder of arteries and arterioles, unspecified: Secondary | ICD-10-CM | POA: Diagnosis not present

## 2016-12-20 NOTE — Progress Notes (Signed)
Cardiology Office Note   Date:  12/20/2016   ID:  MURRAY GUZZETTA, DOB September 16, 1955, MRN [id number]  PCP:  Nicholes Rough, PA-C  Cardiologist:   Dr. Stanford Breed  Chief Complaint  Patient presents with  . Follow-up      History of Present Illness: [name] is a 61 y.o. female who presents for a followup visit regarding peripheral arterial disease. She has known history of  coronary artery status post coronary bypass graft surgery in 2004. She is known to have moderate bilateral carotid stenosis.  She also has prolonged history of diabetes with  chronic kidney disease and  diabetic neuropathy. 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    she was seen recently for large ulceration on the left heel with severe rest pain. ABI was less than 0.2 bilaterally.  I proceeded with angiography which showed no significant aortoiliac disease , flush occlusion of the left SFA with reconstitution in the popliteal artery.  The profunda Had 90% ostial stenosis with three-vessel runoff below the knee. She underwent successful left external iliac/common femoral and profunda endarterectomy with extended profundoplasty, ligation of the left superficial femoral artery and femoral to below the knee popliteal artery bypass with a vein graft. She recovered nicely and reports complete resolution of left leg claudication and rest pain. The ulceration is healing nicely. She continues to have severe right calf claudication and small ulceration affecting the right heel. Renal function did not worsen with angiography and surgery.  Past Medical History:  Diagnosis Date  . Adenomatous polyp of colon   . Anemia   . Arthritis   . Asthma   . Carpal tunnel syndrome   . Chronic renal insufficiency   . Coronary artery disease   . Depression   . Depression with anxiety   . Diabetes mellitus    type II  . Diverticulosis of colon (without mention of hemorrhage)   . GERD  (gastroesophageal reflux disease)   . Heart murmur   . History of hiatal hernia   . Hyperlipidemia   . Hypertension   . Hypothyroidism   . Obesity   . PVD (peripheral vascular disease) ([place])   . Thyroid disease    hypo    Past Surgical History:  Procedure Laterality Date  . ABDOMINAL AORTAGRAM N/A 12/18/2013   Procedure: ABDOMINAL [name];  Surgeon: Wellington Hampshire, MD;  Location: [facility];  Service: Cardiovascular;  Laterality: N/A;  . ABDOMINAL AORTOGRAM W/LOWER EXTREMITY N/A 11/16/2016   Procedure: Abdominal Aortogram w/Lower Extremity;  Surgeon: Wellington Hampshire, MD;  Location: [facility];  Service: Cardiovascular;  Laterality: N/A;  . ABDOMINAL HYSTERECTOMY    . APPENDECTOMY    . CHOLECYSTECTOMY    . CORONARY ARTERY BYPASS GRAFT  2004   x4  . ENDARTERECTOMY FEMORAL Left 11/23/2016   Procedure: ENDARTERECTOMY OF LEFT EXTERNAL COMMON FEMORAL ARTERY WITH EXTENDED LEFT PROFUNDOPLASTY;  Surgeon: Elam Dutch, MD;  Location: [facility];  Service: Vascular;  Laterality: Left;  . FEMORAL-POPLITEAL BYPASS GRAFT Left 11/23/2016   Procedure: LEFT FEMORAL-BELOW KNEE POPLITEAL ARTERY BYPASS;  Surgeon: Elam Dutch, MD;  Location: [facility];  Service: Vascular;  Laterality: Left;  . KNEE SURGERY Right 2013   arthroscopy     Current Outpatient Prescriptions  Medication Sig Dispense Refill  . albuterol (PROVENTIL HFA;VENTOLIN HFA) 108 (90 BASE) MCG/ACT inhaler Inhale 2 puffs into the lungs every 6 (six) hours as  needed for wheezing or shortness of breath.     . ALPRAZolam (XANAX) 1 MG tablet Take 1 mg by mouth 2 (two) times daily as needed for anxiety.     Marland Kitchen amLODipine (NORVASC) 10 MG tablet Take 10 mg by mouth at bedtime.     Marland Kitchen aspirin EC 81 MG tablet Take 81 mg by mouth at bedtime.    Marland Kitchen atorvastatin (LIPITOR) 80 MG tablet Take 80 mg by mouth daily.      . B-D INS SYR ULTRAFINE 1CC/31G 31G X 5/16" 1 ML MISC USE NEW NEEDLE FOR MEAL TIME 3 TIMES A DAY AND LONG ACTING INSULIN  TWICE A DAY  11  . cilostazol (PLETAL) 100 MG tablet Take 100 mg by mouth 2 (two) times daily.    . fenofibrate micronized (LOFIBRA) 200 MG capsule Take 200 mg by mouth at bedtime.     . ferrous sulfate 325 (65 FE) MG tablet Take 325 mg by mouth 3 (three) times daily.  3  . furosemide (LASIX) 40 MG tablet Take 40 mg by mouth 2 (two) times daily.     Marland Kitchen levothyroxine (SYNTHROID, LEVOTHROID) 175 MCG tablet Take 175 mcg by mouth daily before breakfast.     . metoprolol (LOPRESSOR) 100 MG tablet Take 100 mg by mouth 2 (two) times daily. Takes 100 mg and 50 mg to equal 150 mg twice daily    . metoprolol (LOPRESSOR) 50 MG tablet Take 50 mg by mouth 2 (two) times daily. Takes 100 mg and a 50 mg to equal 150 mg twice daily  6  . Omega-3 Fatty Acids (FISH OIL) 1000 MG CAPS Take 1,000 mg by mouth 2 (two) times daily.    Marland Kitchen omeprazole (PRILOSEC) 40 MG capsule Take 40 mg by mouth daily.    Glory Rosebush VERIO test strip CHECK SUGAR AT HOME 4-5 TIMES A DAY  11  . oxyCODONE (OXYCONTIN) 15 mg 12 hr tablet Take 15 mg by mouth every 12 (twelve) hours.    Marland Kitchen oxyCODONE-acetaminophen (PERCOCET) 10-325 MG tablet Take 1 tablet by mouth 3 (three) times daily.     Marland Kitchen PARoxetine (PAXIL) 40 MG tablet Take 40 mg by mouth at bedtime.     . promethazine (PHENERGAN) 25 MG tablet Take 25 mg by mouth daily as needed for nausea or vomiting.   6  . Vitamin D, Ergocalciferol, (DRISDOL) 50000 UNITS CAPS capsule Take 50,000 Units by mouth once a week. mondays  1   No current facility-administered medications for this visit.     Allergies:   Patient has no known allergies.    Social History:  The patient  reports that she quit smoking about 14 years ago. She has never used smokeless tobacco. She reports that she does not drink alcohol or use drugs.   Family History:  The patient's family history includes Colon polyps in her daughter; Diabetes in her mother; Heart disease in her father.    ROS:  Please see the history of present  illness.   Otherwise, review of systems are positive for none.   All other systems are reviewed and negative.    PHYSICAL EXAM: VS:  BP (!) 98/50 (BP Location: Right Arm, Patient Position: Sitting, Cuff Size: Normal)   Pulse (!) 59   Ht 5\' 3"  (1.6 m)   Wt 164 lb 6.4 oz (74.6 kg)   SpO2 97%   BMI 29.12 kg/m  , BMI Body mass index is 29.12 kg/m. GEN: Well nourished, well developed, in no  acute distress  HEENT: normal  Neck: no JVD,  or masses. Bilateral carotid bruits. Cardiac: RRR; no rubs, or gallops,no edema . 2/6 systolic ejection murmur in the aortic area. Respiratory:  clear to auscultation bilaterally, normal work of breathing GI: soft, nontender, nondistended, + BS MS: no deformity or atrophy  Skin: warm and dry, no rash Neuro:  Strength and sensation are intact Psych: euthymic mood, full affect  EKG:  EKG is not ordered today.    Recent Labs: 11/22/2016: ALT 44 11/26/2016: BUN 31; Creatinine, Ser 1.56; Hemoglobin 9.5; Platelets 170; Potassium 4.5; Sodium 137    Lipid Panel No results found for: CHOL, TRIG, HDL, CHOLHDL, VLDL, LDLCALC, LDLDIRECT    Wt Readings from Last 3 Encounters:  12/20/16 164 lb 6.4 oz (74.6 kg)  12/09/16 167 lb 5 oz (75.9 kg)  11/23/16 176 lb 9.4 oz (80.1 kg)         ASSESSMENT AND PLAN:  1. Peripheral arterial disease Status post recent successful surgical revascularization of the left lower extremities with excellent results. Resolution of claudication and rest pain and significant improvement and ulceration.  She has significant disease affecting the right lower extremity with severe claudication and small ulceration affecting the right heel. She will require angiography of the right lower extremity once she is healed from recent surgery.  2. Coronary artery disease: She has no chest pain. Stable exertional dyspnea.  3. Essential hypertension: Blood pressure is well controlled on current medications. I  4. Hyperlipidemia: Currently  on atorvastatin and fenofibrate.  5. Bilateral carotid stenosis: Moderate bilateral disease.   Disposition:   FU with me in 1 months  Signed,  [name], MD  12/20/2016 11:17 AM    [place]

## 2016-12-20 NOTE — Patient Instructions (Signed)
Medication Instructions:  Your physician recommends that you continue on your current medications as directed. Please refer to the Current Medication list given to you today.  Follow-Up: Your physician recommends that you schedule a follow-up appointment in: 1 MONTH with Dr. Fletcher Anon.   Any Other Special Instructions Will Be Listed Below (If Applicable).     If you need a refill on your cardiac medications before your next appointment, please call your pharmacy.

## 2016-12-23 ENCOUNTER — Other Ambulatory Visit: Payer: Self-pay

## 2016-12-23 ENCOUNTER — Encounter: Payer: Self-pay | Admitting: Vascular Surgery

## 2016-12-23 NOTE — Addendum Note (Signed)
Addended by: Lianne Cure A on: 12/23/2016 10:39 AM   Modules accepted: Orders

## 2016-12-30 ENCOUNTER — Ambulatory Visit (HOSPITAL_COMMUNITY)
Admission: RE | Admit: 2016-12-30 | Discharge: 2016-12-30 | Disposition: A | Discharge status: Home / Self Care | Payer: Medicare Other | Source: Ambulatory Visit | Attending: Surgery | Admitting: Surgery

## 2016-12-30 ENCOUNTER — Ambulatory Visit (INDEPENDENT_AMBULATORY_CARE_PROVIDER_SITE_OTHER)
Admission: RE | Admit: 2016-12-30 | Discharge: 2016-12-30 | Disposition: A | Discharge status: Home / Self Care | Payer: Medicare Other | Source: Ambulatory Visit | Attending: Surgery | Admitting: Surgery

## 2016-12-30 DIAGNOSIS — I779 Disorder of arteries and arterioles, unspecified: Secondary | ICD-10-CM

## 2016-12-30 DIAGNOSIS — I771 Stricture of artery: Secondary | ICD-10-CM

## 2016-12-30 DIAGNOSIS — R9439 Abnormal result of other cardiovascular function study: Secondary | ICD-10-CM | POA: Diagnosis not present

## 2016-12-30 DIAGNOSIS — Z95828 Presence of other vascular implants and grafts: Secondary | ICD-10-CM | POA: Insufficient documentation

## 2017-01-05 ENCOUNTER — Encounter: Payer: Self-pay | Admitting: Vascular Surgery

## 2017-01-05 ENCOUNTER — Ambulatory Visit (INDEPENDENT_AMBULATORY_CARE_PROVIDER_SITE_OTHER): Payer: Self-pay | Admitting: Vascular Surgery

## 2017-01-05 VITALS — BP 128/63 | HR 60 | Temp 98.5°F | Resp 18 | Ht 63.0 in | Wt 164.0 lb

## 2017-01-05 DIAGNOSIS — I739 Peripheral vascular disease, unspecified: Secondary | ICD-10-CM

## 2017-01-05 NOTE — Progress Notes (Signed)
Is a 61 year old female who returns for postoperative follow-up today. She recently underwent left femoral endarterectomy with profundoplasty vein patch to the left common femoral artery and left femoral to below-knee popliteal bypass 11/23/16. This was done for a nonhealing wound in the left heel as well as short distance claudication and early rest pain. She states that her claudication symptoms of totally resolved in the left leg. She no longer has rest pain. She feels that the ulcer on her left heel is shrinking in size. She does have a new ulcer on the right heel and is being considered for an arteriogram by Dr. Velva Harman in the near future. She is not smoking.  Physical exam:  Vitals:   01/05/17 1520  BP: 128/63  Pulse: 60  Resp: 18  Temp: 98.5 F (36.9 C)  TempSrc: Oral  SpO2: 94%  Weight: 164 lb (74.4 kg)  Height: 5\' 3"  (1.6 m)    Sherman these: Left groin 2 cm opening was some fibrinous exudate debrided at the bedside today back to reasonable bleeding tissue. No evidence of graft exposed. No purulent drainage. No surrounding erythema. Left distal saphenectomy incision with some maceration and eschar of the skin which was also debrided back to healthy tissue. This is fairly superficial less than a millimeter depth. She has an easily palpable 2+ left dorsalis pedis pulse. She has a 2 cm left heel ulcer which is less than 1 mm in depth. The right heel ulcer is about the same diameter the right foot is dusky in appearance.  Data: The patient had ABIs performed which were greater than 1 the left leg and a graft duplex showed a widely patent left femoral below-knee popliteal bypass with some possible plaque still remaining in the left common femoral artery with slightly increased velocities of 300 cm/s.  Assessment: Patent left lower extremity bypass graft with poorly healing left groin and left lower calf wound. These should heal fairly quickly with local wound care. The patient will follow-up  with a graft duplex scan and repeat ABIs in 3 months.  Right lower extremity peripheral arterial disease. Fairly severe nature. Dr. Velva Harman is considering doing an arteriogram however we need to get her left groin completely healed before considering this unless he would consider a brachial or right groin approach.  Ruta Hinds, MD [facility] Office: [phone] Pager: [phone]

## 2017-01-17 ENCOUNTER — Encounter: Payer: Self-pay | Admitting: Cardiovascular Disease

## 2017-01-17 ENCOUNTER — Ambulatory Visit (INDEPENDENT_AMBULATORY_CARE_PROVIDER_SITE_OTHER): Payer: Medicare Other | Admitting: Cardiovascular Disease

## 2017-01-17 VITALS — BP 124/46 | HR 60 | Ht 63.0 in | Wt 166.0 lb

## 2017-01-17 DIAGNOSIS — I779 Disorder of arteries and arterioles, unspecified: Secondary | ICD-10-CM | POA: Diagnosis not present

## 2017-01-17 DIAGNOSIS — I2510 Atherosclerotic heart disease of native coronary artery without angina pectoris: Secondary | ICD-10-CM

## 2017-01-17 DIAGNOSIS — I739 Peripheral vascular disease, unspecified: Secondary | ICD-10-CM | POA: Diagnosis not present

## 2017-01-17 DIAGNOSIS — I10 Essential (primary) hypertension: Secondary | ICD-10-CM

## 2017-01-17 NOTE — Patient Instructions (Addendum)
Medication Instructions:  Your physician recommends that you continue on your current medications as directed. Please refer to the Current Medication list given to you today.  Labwork: No new orders.   Testing/Procedures: Your physician has requested that you have a peripheral vascular angiogram. This exam is performed at the hospital. During this exam IV contrast is used to look at arterial blood flow. Please review the information sheet given for details.    [facility] [address] [place] [address] Dept: [phone] Loc: [facility]  01/17/2017  You are scheduled for a Peripheral Angiogram on Wednesday, July 18 with Dr. [name].  1. Please arrive at the [facility] (Main Entrance A) at [facility]: [address] [place] [address] at 5:30 AM (two hours before your procedure to ensure your preparation). Free valet parking service is available.   Special note: Every effort is made to have your procedure done on time. Please understand that emergencies sometimes delay scheduled procedures.  2. Diet: Do not eat or drink anything after midnight prior to your procedure except sips of water to take medications.  3. Labs: Your labs will be performed at the hospital after you arrive for your procedure.  4. Medication instructions in preparation for your procedure:  DO NOT take Furosemide (Lasix) the morning of procedure.  Please take half of your normal dosage of Insulin the night before procedure and do not take Insulin the morning of procedure.   On the morning of your procedure, take your Aspirin and any morning medicines NOT listed above.  You may use sips of water.  5. Plan for one night stay--bring personal belongings.  6. Bring a current list of your medications and current insurance cards.  7. You MUST have a responsible person to drive you  home.  8. Someone MUST be with you the first 24 hours after you arrive home or your discharge will be delayed.  9. Please wear clothes that are easy to get on and off and wear slip-on shoes.  Thank you for allowing [place] to care for you!   -- [facility]   Follow-Up: Your physician recommends that you schedule a follow-up appointment in: 1 MONTH with Dr Fletcher Anon    Any Other Special Instructions Will Be Listed Below (If Applicable).     If you need a refill on your cardiac medications before your next appointment, please call your pharmacy.

## 2017-01-17 NOTE — Progress Notes (Signed)
Cardiology Office Note   Date:  01/17/2017   ID:  [name], DOB 10/06/1955, MRN [id number]  PCP:  Nicholes Rough, PA-C  Cardiologist:   Dr. Stanford Breed  Chief Complaint  Patient presents with  . 1 month follow up      History of Present Illness: [name] is a 61 y.o. female who presents for a followup visit regarding peripheral arterial disease. She has known history of  coronary artery status post coronary bypass graft surgery in 2004. She is known to have moderate bilateral carotid stenosis.  She also has prolonged history of diabetes with  chronic kidney disease and  diabetic neuropathy. 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   She had recent critical limb ischemia manifested by large ulceration affecting the left heel with rest pain. Angiography showed no significant aortoiliac disease , flush occlusion of the left SFA with reconstitution in the popliteal artery.  The profunda Had 90% ostial stenosis with three-vessel runoff below the knee. She underwent successful left external iliac/common femoral and profunda endarterectomy with extended profundoplasty, ligation of the left superficial femoral artery and femoral to below the knee popliteal artery bypass with a vein graft.   She continues to have ulceration affecting the right heel with slight worsening since last visit. She has severe right calf claudication and numbness at rest. The left groin surgical wound has not healed yet.  Past Medical History:  Diagnosis Date  . Adenomatous polyp of colon   . Anemia   . Arthritis   . Asthma   . Carpal tunnel syndrome   . Chronic renal insufficiency   . Coronary artery disease   . Depression   . Depression with anxiety   . Diabetes mellitus    type II  . Diverticulosis of colon (without mention of hemorrhage)   . GERD (gastroesophageal reflux disease)   . Heart murmur   . History of hiatal hernia   . Hyperlipidemia   .  Hypertension   . Hypothyroidism   . Obesity   . PVD (peripheral vascular disease) ([place])   . Thyroid disease    hypo    Past Surgical History:  Procedure Laterality Date  . ABDOMINAL AORTAGRAM N/A 12/18/2013   Procedure: ABDOMINAL [name];  Surgeon: Wellington Hampshire, MD;  Location: [facility];  Service: Cardiovascular;  Laterality: N/A;  . ABDOMINAL AORTOGRAM W/LOWER EXTREMITY N/A 11/16/2016   Procedure: Abdominal Aortogram w/Lower Extremity;  Surgeon: Wellington Hampshire, MD;  Location: [facility];  Service: Cardiovascular;  Laterality: N/A;  . ABDOMINAL HYSTERECTOMY    . APPENDECTOMY    . CHOLECYSTECTOMY    . CORONARY ARTERY BYPASS GRAFT  2004   x4  . ENDARTERECTOMY FEMORAL Left 11/23/2016   Procedure: ENDARTERECTOMY OF LEFT EXTERNAL COMMON FEMORAL ARTERY WITH EXTENDED LEFT PROFUNDOPLASTY;  Surgeon: Elam Dutch, MD;  Location: [facility];  Service: Vascular;  Laterality: Left;  . FEMORAL-POPLITEAL BYPASS GRAFT Left 11/23/2016   Procedure: LEFT FEMORAL-BELOW KNEE POPLITEAL ARTERY BYPASS;  Surgeon: Elam Dutch, MD;  Location: [employer];  Service: Vascular;  Laterality: Left;  . KNEE SURGERY Right 2013   arthroscopy     Current Outpatient Prescriptions  Medication Sig Dispense Refill  . albuterol (PROVENTIL HFA;VENTOLIN HFA) 108 (90 BASE) MCG/ACT inhaler Inhale 2 puffs into the lungs every 6 (six) hours as needed for wheezing or shortness of breath.     . ALPRAZolam Duanne Moron)  1 MG tablet Take 1 mg by mouth 2 (two) times daily as needed for anxiety.     Marland Kitchen amLODipine (NORVASC) 10 MG tablet Take 10 mg by mouth at bedtime.     Marland Kitchen aspirin EC 81 MG tablet Take 81 mg by mouth at bedtime.    Marland Kitchen atorvastatin (LIPITOR) 80 MG tablet Take 80 mg by mouth daily.      . B-D INS SYR ULTRAFINE 1CC/31G 31G X 5/16" 1 ML MISC USE NEW NEEDLE FOR MEAL TIME 3 TIMES A DAY AND LONG ACTING INSULIN TWICE A DAY  11  . cilostazol (PLETAL) 100 MG tablet Take 100 mg by mouth 2 (two) times daily.    .  fenofibrate micronized (LOFIBRA) 200 MG capsule Take 200 mg by mouth at bedtime.     . ferrous sulfate 325 (65 FE) MG tablet Take 325 mg by mouth 3 (three) times daily.  3  . furosemide (LASIX) 40 MG tablet Take 40 mg by mouth 2 (two) times daily.     Marland Kitchen levothyroxine (SYNTHROID, LEVOTHROID) 175 MCG tablet Take 175 mcg by mouth daily before breakfast.     . metoprolol (LOPRESSOR) 100 MG tablet Take 100 mg by mouth 2 (two) times daily. Takes 100 mg and 50 mg to equal 150 mg twice daily    . metoprolol (LOPRESSOR) 50 MG tablet Take 50 mg by mouth 2 (two) times daily. Takes 100 mg and a 50 mg to equal 150 mg twice daily  6  . Omega-3 Fatty Acids (FISH OIL) 1000 MG CAPS Take 1,000 mg by mouth 2 (two) times daily.    Marland Kitchen omeprazole (PRILOSEC) 40 MG capsule Take 40 mg by mouth daily.    Glory Rosebush VERIO test strip CHECK SUGAR AT HOME 4-5 TIMES A DAY  11  . oxyCODONE (OXYCONTIN) 15 mg 12 hr tablet Take 15 mg by mouth every 12 (twelve) hours.    Marland Kitchen oxyCODONE-acetaminophen (PERCOCET) 10-325 MG tablet Take 1 tablet by mouth 3 (three) times daily.     Marland Kitchen PARoxetine (PAXIL) 40 MG tablet Take 40 mg by mouth at bedtime.     . promethazine (PHENERGAN) 25 MG tablet Take 25 mg by mouth daily as needed for nausea or vomiting.   6  . Vitamin D, Ergocalciferol, (DRISDOL) 50000 UNITS CAPS capsule Take 50,000 Units by mouth once a week. mondays  1   No current facility-administered medications for this visit.     Allergies:   Patient has no known allergies.    Social History:  The patient  reports that she quit smoking about 14 years ago. She has never used smokeless tobacco. She reports that she does not drink alcohol or use drugs.   Family History:  The patient's family history includes Colon polyps in her daughter; Diabetes in her mother; Heart disease in her father.    ROS:  Please see the history of present illness.   Otherwise, review of systems are positive for none.   All other systems are reviewed and  negative.    PHYSICAL EXAM: VS:  BP (!) 124/46   Pulse 60   Ht 5\' 3"  (1.6 m)   Wt 166 lb (75.3 kg)   BMI 29.41 kg/m  , BMI Body mass index is 29.41 kg/m. GEN: Well nourished, well developed, in no acute distress  HEENT: normal  Neck: no JVD,  or masses. Bilateral carotid bruits. Cardiac: RRR; no rubs, or gallops,no edema . 2/6 systolic ejection murmur in the aortic area.  Respiratory:  clear to auscultation bilaterally, normal work of breathing GI: soft, nontender, nondistended, + BS MS: no deformity or atrophy  Skin: warm and dry, no rash Neuro:  Strength and sensation are intact Psych: euthymic mood, full affect There is approximately an 8 x 8 mm ulceration on the right heel. The surgical wound in the left groin is not completely healed.    EKG:  EKG is not ordered today.    Recent Labs: 11/22/2016: ALT 44 11/26/2016: BUN 31; Creatinine, Ser 1.56; Hemoglobin 9.5; Platelets 170; Potassium 4.5; Sodium 137    Lipid Panel No results found for: CHOL, TRIG, HDL, CHOLHDL, VLDL, LDLCALC, LDLDIRECT    Wt Readings from Last 3 Encounters:  01/17/17 166 lb (75.3 kg)  01/05/17 164 lb (74.4 kg)  12/20/16 164 lb 6.4 oz (74.6 kg)         ASSESSMENT AND PLAN:  1. Peripheral arterial disease Status post successful surgical revascularization of the left lower extremities with excellent results. Resolution of claudication and rest pain and significant improvement in ulceration.  She has significant disease affecting the right lower extremity with severe claudication and small ulceration affecting the right heel. There has been slight worsening of the ulceration and thus I recommend proceeding with angiography of the right lower extremity. Not able to perform the procedure via the left common femoral artery given that the wound in the left groin has not healed completely. Plan access via the left brachial artery. She has a normal pulse there. She is known to have diffuse calcified  disease affecting the right SFA close to the ostium . Thus, antegrade right femoral access is difficult due to that. She will require preprocedure hydration due to chronic kidney disease.  2. Coronary artery disease: She has no chest pain. Stable exertional dyspnea.  3. Essential hypertension: Blood pressure is well controlled on current medications. I  4. Hyperlipidemia: Currently on atorvastatin and fenofibrate.  5. Bilateral carotid stenosis: Moderate bilateral disease.   Disposition:   FU with me in 1 months  Signed,  [name], MD  01/17/2017 6:02 PM    [facility]

## 2017-01-18 NOTE — Addendum Note (Signed)
Addended by: Lianne Cure A on: 01/18/2017 10:46 AM   Modules accepted: Orders

## 2017-01-24 ENCOUNTER — Ambulatory Visit: Payer: Medicare Other | Admitting: Cardiovascular Disease

## 2017-01-25 ENCOUNTER — Encounter (HOSPITAL_COMMUNITY)
Admission: RE | Disposition: A | Discharge status: Home / Self Care | Payer: Self-pay | Source: Ambulatory Visit | Attending: Cardiovascular Disease

## 2017-01-25 ENCOUNTER — Ambulatory Visit (HOSPITAL_COMMUNITY)
Admission: RE | Admit: 2017-01-25 | Discharge: 2017-01-25 | Disposition: A | Discharge status: Home / Self Care | Payer: Medicare Other | Source: Ambulatory Visit | Attending: Cardiovascular Disease | Admitting: Cardiovascular Disease

## 2017-01-25 DIAGNOSIS — I6523 Occlusion and stenosis of bilateral carotid arteries: Secondary | ICD-10-CM | POA: Diagnosis not present

## 2017-01-25 DIAGNOSIS — I129 Hypertensive chronic kidney disease with stage 1 through stage 4 chronic kidney disease, or unspecified chronic kidney disease: Secondary | ICD-10-CM | POA: Insufficient documentation

## 2017-01-25 DIAGNOSIS — L97429 Non-pressure chronic ulcer of left heel and midfoot with unspecified severity: Secondary | ICD-10-CM | POA: Insufficient documentation

## 2017-01-25 DIAGNOSIS — E785 Hyperlipidemia, unspecified: Secondary | ICD-10-CM | POA: Insufficient documentation

## 2017-01-25 DIAGNOSIS — E039 Hypothyroidism, unspecified: Secondary | ICD-10-CM | POA: Insufficient documentation

## 2017-01-25 DIAGNOSIS — E669 Obesity, unspecified: Secondary | ICD-10-CM | POA: Diagnosis not present

## 2017-01-25 DIAGNOSIS — Z951 Presence of aortocoronary bypass graft: Secondary | ICD-10-CM | POA: Insufficient documentation

## 2017-01-25 DIAGNOSIS — I739 Peripheral vascular disease, unspecified: Secondary | ICD-10-CM

## 2017-01-25 DIAGNOSIS — I2510 Atherosclerotic heart disease of native coronary artery without angina pectoris: Secondary | ICD-10-CM | POA: Insufficient documentation

## 2017-01-25 DIAGNOSIS — Z87891 Personal history of nicotine dependence: Secondary | ICD-10-CM | POA: Diagnosis not present

## 2017-01-25 DIAGNOSIS — E1140 Type 2 diabetes mellitus with diabetic neuropathy, unspecified: Secondary | ICD-10-CM | POA: Insufficient documentation

## 2017-01-25 DIAGNOSIS — J45909 Unspecified asthma, uncomplicated: Secondary | ICD-10-CM | POA: Insufficient documentation

## 2017-01-25 DIAGNOSIS — M1990 Unspecified osteoarthritis, unspecified site: Secondary | ICD-10-CM | POA: Insufficient documentation

## 2017-01-25 DIAGNOSIS — K219 Gastro-esophageal reflux disease without esophagitis: Secondary | ICD-10-CM | POA: Insufficient documentation

## 2017-01-25 DIAGNOSIS — I70244 Atherosclerosis of native arteries of left leg with ulceration of heel and midfoot: Secondary | ICD-10-CM | POA: Insufficient documentation

## 2017-01-25 DIAGNOSIS — F418 Other specified anxiety disorders: Secondary | ICD-10-CM | POA: Diagnosis not present

## 2017-01-25 DIAGNOSIS — Z7982 Long term (current) use of aspirin: Secondary | ICD-10-CM | POA: Insufficient documentation

## 2017-01-25 DIAGNOSIS — I70211 Atherosclerosis of native arteries of extremities with intermittent claudication, right leg: Secondary | ICD-10-CM | POA: Diagnosis not present

## 2017-01-25 DIAGNOSIS — N189 Chronic kidney disease, unspecified: Secondary | ICD-10-CM | POA: Diagnosis not present

## 2017-01-25 DIAGNOSIS — Z6829 Body mass index (BMI) 29.0-29.9, adult: Secondary | ICD-10-CM | POA: Insufficient documentation

## 2017-01-25 DIAGNOSIS — E1122 Type 2 diabetes mellitus with diabetic chronic kidney disease: Secondary | ICD-10-CM | POA: Diagnosis not present

## 2017-01-25 DIAGNOSIS — E1151 Type 2 diabetes mellitus with diabetic peripheral angiopathy without gangrene: Secondary | ICD-10-CM | POA: Diagnosis present

## 2017-01-25 HISTORY — PX: ABDOMINAL AORTOGRAM W/LOWER EXTREMITY: CATH118223

## 2017-01-25 LAB — CBC
HCT: 32.3 % — ABNORMAL LOW (ref 36.0–46.0)
HEMOGLOBIN: 10.5 g/dL — AB (ref 12.0–15.0)
MCH: 29.7 pg (ref 26.0–34.0)
MCHC: 32.5 g/dL (ref 30.0–36.0)
MCV: 91.5 fL (ref 78.0–100.0)
Platelets: 173 10*3/uL (ref 150–400)
RBC: 3.53 MIL/uL — AB (ref 3.87–5.11)
RDW: 13.7 % (ref 11.5–15.5)
WBC: 7.7 10*3/uL (ref 4.0–10.5)

## 2017-01-25 LAB — BASIC METABOLIC PANEL
ANION GAP: 6 (ref 5–15)
BUN: 29 mg/dL — ABNORMAL HIGH (ref 6–20)
CHLORIDE: 104 mmol/L (ref 101–111)
CO2: 24 mmol/L (ref 22–32)
Calcium: 8.2 mg/dL — ABNORMAL LOW (ref 8.9–10.3)
Creatinine, Ser: 1.27 mg/dL — ABNORMAL HIGH (ref 0.44–1.00)
GFR, EST AFRICAN AMERICAN: 51 mL/min — AB (ref >60)
GFR, EST NON AFRICAN AMERICAN: 44 mL/min — AB (ref >60)
Glucose, Bld: 317 mg/dL — ABNORMAL HIGH (ref 65–99)
POTASSIUM: 3.9 mmol/L (ref 3.5–5.1)
SODIUM: 134 mmol/L — AB (ref 135–145)

## 2017-01-25 LAB — PROTIME-INR
INR: 0.96
PROTHROMBIN TIME: 12.8 s (ref 11.4–15.2)

## 2017-01-25 LAB — GLUCOSE, CAPILLARY
GLUCOSE-CAPILLARY: 396 mg/dL — AB (ref 65–99)
Glucose-Capillary: 214 mg/dL — ABNORMAL HIGH (ref 65–99)

## 2017-01-25 LAB — POCT ACTIVATED CLOTTING TIME: ACTIVATED CLOTTING TIME: 169 s

## 2017-01-25 SURGERY — ABDOMINAL AORTOGRAM W/LOWER EXTREMITY
Anesthesia: LOCAL

## 2017-01-25 MED ORDER — SODIUM CHLORIDE 0.9 % IV SOLN
INTRAVENOUS | Status: DC
  Administered 2017-01-25: 08:00:00 via INTRAVENOUS

## 2017-01-25 MED ORDER — SODIUM CHLORIDE 0.9 % IV SOLN: 250.0000 mL | INTRAVENOUS | Status: DC | PRN

## 2017-01-25 MED ORDER — SODIUM CHLORIDE 0.9% FLUSH: 3.0000 mL | Freq: Two times a day (BID) | INTRAVENOUS | Status: DC

## 2017-01-25 MED ORDER — MIDAZOLAM HCL 2 MG/2ML IJ SOLN
INTRAMUSCULAR | Status: DC | PRN
  Administered 2017-01-25: 1 mg via INTRAVENOUS

## 2017-01-25 MED ORDER — HEPARIN SODIUM (PORCINE) 1000 UNIT/ML IJ SOLN
INTRAMUSCULAR | Status: AC
  Filled 2017-01-25: qty 1

## 2017-01-25 MED ORDER — NITROGLYCERIN 1 MG/10 ML FOR IR/CATH LAB
INTRA_ARTERIAL | Status: AC
  Filled 2017-01-25: qty 10

## 2017-01-25 MED ORDER — FENTANYL CITRATE (PF) 100 MCG/2ML IJ SOLN
INTRAMUSCULAR | Status: DC | PRN
  Administered 2017-01-25: 25 ug via INTRAVENOUS

## 2017-01-25 MED ORDER — IODIXANOL 320 MG/ML IV SOLN
INTRAVENOUS | Status: DC | PRN
  Administered 2017-01-25: 40 mL via INTRA_ARTERIAL

## 2017-01-25 MED ORDER — HEPARIN (PORCINE) IN NACL 2-0.9 UNIT/ML-% IJ SOLN
INTRAMUSCULAR | Status: AC | PRN
  Administered 2017-01-25: 1000 mL via INTRA_ARTERIAL

## 2017-01-25 MED ORDER — SODIUM CHLORIDE 0.9% FLUSH: 3.0000 mL | INTRAVENOUS | Status: DC | PRN

## 2017-01-25 MED ORDER — SODIUM CHLORIDE 0.9 % IV SOLN: INTRAVENOUS | Status: AC

## 2017-01-25 MED ORDER — ASPIRIN 81 MG PO CHEW
CHEWABLE_TABLET | ORAL | Status: AC
  Administered 2017-01-25: 81 mg via ORAL
  Filled 2017-01-25: qty 1

## 2017-01-25 MED ORDER — FENTANYL CITRATE (PF) 100 MCG/2ML IJ SOLN
INTRAMUSCULAR | Status: AC
  Filled 2017-01-25: qty 2

## 2017-01-25 MED ORDER — ASPIRIN 81 MG PO CHEW
81.0000 mg | CHEWABLE_TABLET | ORAL | Status: AC
  Administered 2017-01-25: 81 mg via ORAL

## 2017-01-25 MED ORDER — NITROGLYCERIN 1 MG/10 ML FOR IR/CATH LAB
INTRA_ARTERIAL | Status: DC | PRN
  Administered 2017-01-25: 200 ug via INTRA_ARTERIAL

## 2017-01-25 MED ORDER — MIDAZOLAM HCL 2 MG/2ML IJ SOLN
INTRAMUSCULAR | Status: AC
  Filled 2017-01-25: qty 2

## 2017-01-25 MED ORDER — HEPARIN (PORCINE) IN NACL 2-0.9 UNIT/ML-% IJ SOLN
INTRAMUSCULAR | Status: AC
  Filled 2017-01-25: qty 1000

## 2017-01-25 MED ORDER — LIDOCAINE HCL (PF) 1 % IJ SOLN
INTRAMUSCULAR | Status: AC
  Filled 2017-01-25: qty 30

## 2017-01-25 MED ORDER — LIDOCAINE HCL (PF) 1 % IJ SOLN
INTRAMUSCULAR | Status: DC | PRN
  Administered 2017-01-25: 2 mL

## 2017-01-25 MED ORDER — HEPARIN SODIUM (PORCINE) 1000 UNIT/ML IJ SOLN
INTRAMUSCULAR | Status: DC | PRN
  Administered 2017-01-25: 2000 [IU] via INTRAVENOUS

## 2017-01-25 SURGICAL SUPPLY — 13 items
CATH ANGIO 5F PIGTAIL 100CM (CATHETERS) ×2 IMPLANT
CATH INFINITI VERT 5FR 125CM (CATHETERS) ×2 IMPLANT
COVER PRB 48X5XTLSCP FOLD TPE (BAG) ×1 IMPLANT
COVER PROBE 5X48 (BAG) ×1
KIT MICROINTRODUCER STIFF 5F (SHEATH) ×2 IMPLANT
KIT PV (KITS) ×2 IMPLANT
SHEATH PINNACLE 5F 10CM (SHEATH) ×2 IMPLANT
STOPCOCK MORSE 400PSI 3WAY (MISCELLANEOUS) ×2 IMPLANT
SYRINGE MEDRAD AVANTA MACH 7 (SYRINGE) ×2 IMPLANT
TRANSDUCER W/STOPCOCK (MISCELLANEOUS) ×2 IMPLANT
TRAY PV CATH (CUSTOM PROCEDURE TRAY) ×2 IMPLANT
TUBING HIGH PRESSURE 120CM (CONNECTOR) ×2 IMPLANT
WIRE HI TORQ VERSACORE J 260CM (WIRE) ×2 IMPLANT

## 2017-01-25 NOTE — Interval H&P Note (Signed)
History and Physical Interval Note:  01/25/2017 10:49 AM  [name]  has presented today for surgery, with the diagnosis of pad  The various methods of treatment have been discussed with the patient and family. After consideration of risks, benefits and other options for treatment, the patient has consented to  Procedure(s): Abdominal Aortogram w/Lower Extremity (N/A) as a surgical intervention .  The patient's history has been reviewed, patient examined, no change in status, stable for surgery.  I have reviewed the patient's chart and labs.  Questions were answered to the patient's satisfaction.     [name]

## 2017-01-25 NOTE — Progress Notes (Signed)
Order for sheath removal verified per post procedural orders. Procedure explained to patient and Brachial artery access site assessed: level 0, palpable dorsalis pedis and posterior tibial pulses. 5 [place] Sheath removed and manual pressure applied for 20 minutes. Pre, peri, & post procedural vitals: HR 67, RR 12,  O2 Sat 95%, BP 140/43, Pain level 0. Distal pulses remained intact after sheath removal. Access site level 0 and dressed with 4X4 gauze and tegaderm. RN confirmed condition of site. Post procedural instructions discussed with return demonstration from patient.

## 2017-01-25 NOTE — Discharge Instructions (Signed)
Brachial Site Care Refer to this sheet in the next few weeks. These instructions provide you with information about caring for yourself after your procedure. Your health care provider may also give you more specific instructions. Your treatment has been planned according to current medical practices, but problems sometimes occur. Call your health care provider if you have any problems or questions after your procedure. What can I expect after the procedure? After your procedure, it is typical to have the following:  Bruising at the brachial site that usually fades within 1-2 weeks.  Blood collecting in the tissue (hematoma) that may be painful to the touch. It should usually decrease in size and tenderness within 1-2 weeks.  Follow these instructions at home:  Take medicines only as directed by your health care provider.  You may shower 24-48 hours after the procedure or as directed by your health care provider. Remove the bandage (dressing) and gently wash the site with plain soap and water. Pat the area dry with a clean towel. Do not rub the site, because this may cause bleeding.  Do not take baths, swim, or use a hot tub until your health care provider approves.  Check your insertion site every day for redness, swelling, or drainage.  Do not apply powder or lotion to the site.  Do not flex or bend the affected arm for 24 hours or as directed by your health care provider.  Do not push or pull heavy objects with the affected arm for 24 hours or as directed by your health care provider.  Do not lift over 10 lb (4.5 kg) for 5 days after your procedure or as directed by your health care provider.  Ask your health care provider when it is okay to: ? Return to work or school. ? Resume usual physical activities or sports. ? Resume sexual activity.  Do not drive home if you are discharged the same day as the procedure. Have someone else drive you.  You may drive 24 hours after the  procedure unless otherwise instructed by your health care provider.  Do not operate machinery or power tools for 24 hours after the procedure.  If your procedure was done as an outpatient procedure, which means that you went home the same day as your procedure, a responsible adult should be with you for the first 24 hours after you arrive home.  Keep all follow-up visits as directed by your health care provider. This is important. Contact a health care provider if:  You have a fever.  You have chills.  You have increased bleeding from the brachial site. Hold pressure on the site. Get help right away if:  You have unusual pain at the brachial site.  You have redness, warmth, or swelling at the radial site.  You have drainage (other than a small amount of blood on the dressing) from the brachial site.  The brachial site is bleeding, and the bleeding does not stop after 30 minutes of holding steady pressure on the site.  Your arm or hand becomes pale, cool, tingly, or numb. This information is not intended to replace advice given to you by your health care provider. Make sure you discuss any questions you have with your health care provider. Document Released: 07/30/2010 Document Revised: 12/03/2015 Document Reviewed: 01/13/2014 Elsevier Interactive Patient Education  2018 [employer].

## 2017-01-25 NOTE — Progress Notes (Signed)
Inpatient Diabetes Program Recommendations  AACE/ADA: New Consensus Statement on Inpatient Glycemic Control (2015)  Target Ranges:  Prepandial:   less than 140 mg/dL      Peak postprandial:   less than 180 mg/dL (1-2 hours)      Critically ill patients:  140 - 180 mg/dL   Lab Results  Component Value Date   GLUCAP 396 (H) 01/25/2017   HGBA1C 7.2 (H) 11/22/2016   Review of Glycemic Control  Diabetes history: DM 2 Outpatient Diabetes medications: Lantus 20-60 units BID Current orders for Inpatient glycemic control: None in Cath Lab  Inpatient Diabetes Program Recommendations:    Consider Novolog Sensitive Correction 0-9 units tid while here and if admitted consider adding Novolog HS scale 0-5 units, and Lantus 20 units BID  A1c 7.2% on 11/22/16  Thanks,  Tama Headings RN, MSN, [facility] Inpatient Diabetes Coordinator Team Pager [phone] (8a-5p)

## 2017-01-25 NOTE — H&P (View-Only) (Signed)
Cardiology Office Note   Date:  01/17/2017   ID:  [name], DOB [birth year], MRN [id number]  PCP:  Nicholes Rough, PA-C  Cardiologist:   Dr. Stanford Breed  Chief Complaint  Patient presents with  . 1 month follow up      History of Present Illness: [name] is a 61 y.o. female who presents for a followup visit regarding peripheral arterial disease. She has known history of  coronary artery status post coronary bypass graft surgery in 2004. She is known to have moderate bilateral carotid stenosis.  She also has prolonged history of diabetes with  chronic kidney disease and  diabetic neuropathy. 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   She had recent critical limb ischemia manifested by large ulceration affecting the left heel with rest pain. Angiography showed no significant aortoiliac disease , flush occlusion of the left SFA with reconstitution in the popliteal artery.  The profunda Had 90% ostial stenosis with three-vessel runoff below the knee. She underwent successful left external iliac/common femoral and profunda endarterectomy with extended profundoplasty, ligation of the left superficial femoral artery and femoral to below the knee popliteal artery bypass with a vein graft.   She continues to have ulceration affecting the right heel with slight worsening since last visit. She has severe right calf claudication and numbness at rest. The left groin surgical wound has not healed yet.  Past Medical History:  Diagnosis Date  . Adenomatous polyp of colon   . Anemia   . Arthritis   . Asthma   . Carpal tunnel syndrome   . Chronic renal insufficiency   . Coronary artery disease   . Depression   . Depression with anxiety   . Diabetes mellitus    type II  . Diverticulosis of colon (without mention of hemorrhage)   . GERD (gastroesophageal reflux disease)   . Heart murmur   . History of hiatal hernia   . Hyperlipidemia   .  Hypertension   . Hypothyroidism   . Obesity   . PVD (peripheral vascular disease) ([place])   . Thyroid disease    hypo    Past Surgical History:  Procedure Laterality Date  . ABDOMINAL AORTAGRAM N/A 12/18/2013   Procedure: ABDOMINAL [name];  Surgeon: Wellington Hampshire, MD;  Location: [facility] CATH [facility];  Service: Cardiovascular;  Laterality: N/A;  . ABDOMINAL AORTOGRAM W/LOWER EXTREMITY N/A 11/16/2016   Procedure: Abdominal Aortogram w/Lower Extremity;  Surgeon: Wellington Hampshire, MD;  Location: [facility] CV [facility];  Service: Cardiovascular;  Laterality: N/A;  . ABDOMINAL HYSTERECTOMY    . APPENDECTOMY    . CHOLECYSTECTOMY    . CORONARY ARTERY BYPASS GRAFT  2004   x4  . ENDARTERECTOMY FEMORAL Left 11/23/2016   Procedure: ENDARTERECTOMY OF LEFT EXTERNAL COMMON FEMORAL ARTERY WITH EXTENDED LEFT PROFUNDOPLASTY;  Surgeon: Elam Dutch, MD;  Location: [facility];  Service: Vascular;  Laterality: Left;  . FEMORAL-POPLITEAL BYPASS GRAFT Left 11/23/2016   Procedure: LEFT FEMORAL-BELOW KNEE POPLITEAL ARTERY BYPASS;  Surgeon: Elam Dutch, MD;  Location: [facility];  Service: Vascular;  Laterality: Left;  . KNEE SURGERY Right 2013   arthroscopy     Current Outpatient Prescriptions  Medication Sig Dispense Refill  . albuterol (PROVENTIL HFA;VENTOLIN HFA) 108 (90 BASE) MCG/ACT inhaler Inhale 2 puffs into the lungs every 6 (six) hours as needed for wheezing or shortness of breath.     . ALPRAZolam Duanne Moron)  1 MG tablet Take 1 mg by mouth 2 (two) times daily as needed for anxiety.     Marland Kitchen amLODipine (NORVASC) 10 MG tablet Take 10 mg by mouth at bedtime.     Marland Kitchen aspirin EC 81 MG tablet Take 81 mg by mouth at bedtime.    Marland Kitchen atorvastatin (LIPITOR) 80 MG tablet Take 80 mg by mouth daily.      . B-D INS SYR ULTRAFINE 1CC/31G 31G X 5/16" 1 ML MISC USE NEW NEEDLE FOR MEAL TIME 3 TIMES A DAY AND LONG ACTING INSULIN TWICE A DAY  11  . cilostazol (PLETAL) 100 MG tablet Take 100 mg by mouth 2 (two) times daily.    .  fenofibrate micronized (LOFIBRA) 200 MG capsule Take 200 mg by mouth at bedtime.     . ferrous sulfate 325 (65 FE) MG tablet Take 325 mg by mouth 3 (three) times daily.  3  . furosemide (LASIX) 40 MG tablet Take 40 mg by mouth 2 (two) times daily.     Marland Kitchen levothyroxine (SYNTHROID, LEVOTHROID) 175 MCG tablet Take 175 mcg by mouth daily before breakfast.     . metoprolol (LOPRESSOR) 100 MG tablet Take 100 mg by mouth 2 (two) times daily. Takes 100 mg and 50 mg to equal 150 mg twice daily    . metoprolol (LOPRESSOR) 50 MG tablet Take 50 mg by mouth 2 (two) times daily. Takes 100 mg and a 50 mg to equal 150 mg twice daily  6  . Omega-3 Fatty Acids (FISH OIL) 1000 MG CAPS Take 1,000 mg by mouth 2 (two) times daily.    Marland Kitchen omeprazole (PRILOSEC) 40 MG capsule Take 40 mg by mouth daily.    Glory Rosebush VERIO test strip CHECK SUGAR AT HOME 4-5 TIMES A DAY  11  . oxyCODONE (OXYCONTIN) 15 mg 12 hr tablet Take 15 mg by mouth every 12 (twelve) hours.    Marland Kitchen oxyCODONE-acetaminophen (PERCOCET) 10-325 MG tablet Take 1 tablet by mouth 3 (three) times daily.     Marland Kitchen PARoxetine (PAXIL) 40 MG tablet Take 40 mg by mouth at bedtime.     . promethazine (PHENERGAN) 25 MG tablet Take 25 mg by mouth daily as needed for nausea or vomiting.   6  . Vitamin D, Ergocalciferol, (DRISDOL) 50000 UNITS CAPS capsule Take 50,000 Units by mouth once a week. mondays  1   No current facility-administered medications for this visit.     Allergies:   Patient has no known allergies.    Social History:  The patient  reports that she quit smoking about 14 years ago. She has never used smokeless tobacco. She reports that she does not drink alcohol or use drugs.   Family History:  The patient's family history includes Colon polyps in her daughter; Diabetes in her mother; Heart disease in her father.    ROS:  Please see the history of present illness.   Otherwise, review of systems are positive for none.   All other systems are reviewed and  negative.    PHYSICAL EXAM: VS:  BP (!) 124/46   Pulse 60   Ht 5\' 3"  (1.6 m)   Wt 166 lb (75.3 kg)   BMI 29.41 kg/m  , BMI Body mass index is 29.41 kg/m. GEN: Well nourished, well developed, in no acute distress  HEENT: normal  Neck: no JVD,  or masses. Bilateral carotid bruits. Cardiac: RRR; no rubs, or gallops,no edema . 2/6 systolic ejection murmur in the aortic area.  Respiratory:  clear to auscultation bilaterally, normal work of breathing GI: soft, nontender, nondistended, + BS MS: no deformity or atrophy  Skin: warm and dry, no rash Neuro:  Strength and sensation are intact Psych: euthymic mood, full affect There is approximately an 8 x 8 mm ulceration on the right heel. The surgical wound in the left groin is not completely healed.    EKG:  EKG is not ordered today.    Recent Labs: 11/22/2016: ALT 44 11/26/2016: BUN 31; Creatinine, Ser 1.56; Hemoglobin 9.5; Platelets 170; Potassium 4.5; Sodium 137    Lipid Panel No results found for: CHOL, TRIG, HDL, CHOLHDL, VLDL, LDLCALC, LDLDIRECT    Wt Readings from Last 3 Encounters:  01/17/17 166 lb (75.3 kg)  01/05/17 164 lb (74.4 kg)  12/20/16 164 lb 6.4 oz (74.6 kg)         ASSESSMENT AND PLAN:  1. Peripheral arterial disease Status post successful surgical revascularization of the left lower extremities with excellent results. Resolution of claudication and rest pain and significant improvement in ulceration.  She has significant disease affecting the right lower extremity with severe claudication and small ulceration affecting the right heel. There has been slight worsening of the ulceration and thus I recommend proceeding with angiography of the right lower extremity. Not able to perform the procedure via the left common femoral artery given that the wound in the left groin has not healed completely. Plan access via the left brachial artery. She has a normal pulse there. She is known to have diffuse calcified  disease affecting the right SFA close to the ostium . Thus, antegrade right femoral access is difficult due to that. She will require preprocedure hydration due to chronic kidney disease.  2. Coronary artery disease: She has no chest pain. Stable exertional dyspnea.  3. Essential hypertension: Blood pressure is well controlled on current medications. I  4. Hyperlipidemia: Currently on atorvastatin and fenofibrate.  5. Bilateral carotid stenosis: Moderate bilateral disease.   Disposition:   FU with me in 1 months  Signed,  [name], MD  01/17/2017 6:02 PM    [facility]

## 2017-01-26 ENCOUNTER — Encounter (HOSPITAL_COMMUNITY): Payer: Self-pay | Admitting: Cardiovascular Disease

## 2017-01-30 ENCOUNTER — Telehealth: Payer: Self-pay | Admitting: Vascular Surgery

## 2017-01-30 NOTE — Telephone Encounter (Signed)
-----   Message from Mena Goes, RN sent at 01/27/2017  9:06 AM EDT ----- Regarding: Make appt asap per CEF, thanks 02-02-17 Maybe move someone off his schedule to Suzanne's since she has some openings and put this patient on Fields' schedule to discuss surgery, Thanks ----- Message ----- From: Elam Dutch, MD Sent: 01/26/2017   6:24 PM To: Mena Goes, RN  Can you see if you can get her an appt  Thanks  Ruta Hinds ----- Message ----- From: Wellington Hampshire, MD Sent: 01/25/2017  11:44 AM To: Elam Dutch, MD  Juanda Crumble, Can you please review the angiogram from today and see if you can get her in for evaluation of right femoropopliteal bypass. Thank you

## 2017-01-30 NOTE — Telephone Encounter (Signed)
Called patient and scheduled an appt for her to see Dr.Fields on 02/02/17 at 11:45am. She confirmed the appt. awt

## 2017-02-01 ENCOUNTER — Encounter: Payer: Self-pay | Admitting: Vascular Surgery

## 2017-02-02 ENCOUNTER — Encounter: Payer: Self-pay | Admitting: Vascular Surgery

## 2017-02-02 ENCOUNTER — Ambulatory Visit (INDEPENDENT_AMBULATORY_CARE_PROVIDER_SITE_OTHER): Payer: Medicare Other | Admitting: Vascular Surgery

## 2017-02-02 VITALS — BP 153/65 | HR 61 | Temp 97.9°F | Resp 20 | Ht 63.0 in | Wt 163.0 lb

## 2017-02-02 DIAGNOSIS — I739 Peripheral vascular disease, unspecified: Secondary | ICD-10-CM

## 2017-02-02 DIAGNOSIS — I779 Disorder of arteries and arterioles, unspecified: Secondary | ICD-10-CM | POA: Diagnosis not present

## 2017-02-02 NOTE — Progress Notes (Signed)
Referring Physician: Dr. Fletcher Anon Patient name: [name] MRN: [id number] DOB: July 01, 1956 Sex: female  REASON FOR CONSULT: Nonhealing wound right foot with peripheral arterial disease  HPI: [name] is a 61 y.o. female who previously underwent left femoral to below-knee popliteal bypass with a vein graft approximately 10 weeks ago. This was done for a nonhealing wound on her posterior heel. This is slowly healing. She still has some residual swelling. The groin incision is not completely healed but not really draining. She now is referred for right femoral popliteal bypass grafting again for a nonhealing wound on her right foot. Recent arteriogram by Dr. Fletcher Anon shows anatomy suitable for a right femoral below-knee popliteal bypass. She has had previous right leg vein harvest for coronary grafting. She states she has quit smoking. Other medical problems include CK D3, coronary artery disease, diabetes, hypertension, all of which are currently stable.  Past Medical History:  Diagnosis Date  . Adenomatous polyp of colon   . Anemia   . Arthritis   . Asthma   . Carpal tunnel syndrome   . Chronic renal insufficiency   . Coronary artery disease   . Depression   . Depression with anxiety   . Diabetes mellitus    type II  . Diverticulosis of colon (without mention of hemorrhage)   . GERD (gastroesophageal reflux disease)   . Heart murmur   . History of hiatal hernia   . Hyperlipidemia   . Hypertension   . Hypothyroidism   . Obesity   . PVD (peripheral vascular disease) ([place])   . Thyroid disease    hypo   Past Surgical History:  Procedure Laterality Date  . ABDOMINAL AORTAGRAM N/A 12/18/2013   Procedure: ABDOMINAL [name];  Surgeon: Wellington Hampshire, MD;  Location: [facility]ATH [facility];  Service: Cardiovascular;  Laterality: N/A;  . ABDOMINAL AORTOGRAM W/LOWER EXTREMITY N/A 11/16/2016   Procedure: Abdominal Aortogram w/Lower Extremity;  Surgeon: Wellington Hampshire, MD;  Location: [facility] CV [facility];  Service: Cardiovascular;  Laterality: N/A;  . ABDOMINAL AORTOGRAM W/LOWER EXTREMITY N/A 01/25/2017   Procedure: Abdominal Aortogram w/Lower Extremity;  Surgeon: Wellington Hampshire, MD;  Location: [facility];  Service: Cardiovascular;  Laterality: N/A;  only completed Lower Extremity  . ABDOMINAL HYSTERECTOMY    . APPENDECTOMY    . CHOLECYSTECTOMY    . CORONARY ARTERY BYPASS GRAFT  2004   x4  . ENDARTERECTOMY FEMORAL Left 11/23/2016   Procedure: ENDARTERECTOMY OF LEFT EXTERNAL COMMON FEMORAL ARTERY WITH EXTENDED LEFT PROFUNDOPLASTY;  Surgeon: Elam Dutch, MD;  Location: [facility];  Service: Vascular;  Laterality: Left;  . FEMORAL-POPLITEAL BYPASS GRAFT Left 11/23/2016   Procedure: LEFT FEMORAL-BELOW KNEE POPLITEAL ARTERY BYPASS;  Surgeon: Elam Dutch, MD;  Location: [facility];  Service: Vascular;  Laterality: Left;  . KNEE SURGERY Right 2013   arthroscopy    Family History  Problem Relation Age of Onset  . Diabetes Mother   . Heart disease Father   . Colon polyps Daughter   . Colon cancer Neg Hx   . Esophageal cancer Neg Hx   . Stomach cancer Neg Hx   . Rectal cancer Neg Hx     SOCIAL HISTORY: Social History   Social History  . Marital status: Widowed    Spouse name: N/A  . Number of children: 2  . Years of education: N/A   Occupational History  . [employer]    retired  Social History Main Topics  . Smoking status: Former Smoker    Quit date: 2004  . Smokeless tobacco: Never Used  . Alcohol use No  . Drug use: No  . Sexual activity: Not on file   Other Topics Concern  . Not on file   Social History Narrative   Daily Caffeine    No Known Allergies  Current Outpatient Prescriptions  Medication Sig Dispense Refill  . albuterol (PROVENTIL HFA;VENTOLIN HFA) 108 (90 BASE) MCG/ACT inhaler Inhale 2 puffs into the lungs every 6 (six) hours as needed for wheezing or shortness of breath.     . ALPRAZolam (XANAX) 1 MG  tablet Take 1 mg by mouth 2 (two) times daily as needed for anxiety.     Marland Kitchen amLODipine (NORVASC) 10 MG tablet Take 10 mg by mouth at bedtime.     Marland Kitchen aspirin EC 81 MG tablet Take 81 mg by mouth at bedtime.    Marland Kitchen atorvastatin (LIPITOR) 80 MG tablet Take 80 mg by mouth every morning.     . B-D INS SYR ULTRAFINE 1CC/31G 31G X 5/16" 1 ML MISC USE NEW NEEDLE FOR MEAL TIME 3 TIMES A DAY AND LONG ACTING INSULIN TWICE A DAY  11  . cilostazol (PLETAL) 100 MG tablet Take 100 mg by mouth 2 (two) times daily.    . fenofibrate micronized (LOFIBRA) 200 MG capsule Take 200 mg by mouth at bedtime.     . ferrous sulfate 325 (65 FE) MG tablet Take 325 mg by mouth 3 (three) times daily.  3  . furosemide (LASIX) 40 MG tablet Take 40 mg by mouth 2 (two) times daily.     . insulin glargine (LANTUS) 100 UNIT/ML injection Inject 20-60 Units into the skin 2 (two) times daily. Sliding Scale    . levothyroxine (SYNTHROID, LEVOTHROID) 175 MCG tablet Take 175 mcg by mouth daily before breakfast.     . metoprolol (LOPRESSOR) 100 MG tablet Take 100 mg by mouth 2 (two) times daily. Takes 100 mg and 50 mg to equal 150 mg twice daily    . metoprolol (LOPRESSOR) 50 MG tablet Take 50 mg by mouth 2 (two) times daily. Takes 100 mg and a 50 mg to equal 150 mg twice daily  6  . Omega-3 Fatty Acids (FISH OIL) 1000 MG CAPS Take 1,000 mg by mouth 2 (two) times daily.    Marland Kitchen omeprazole (PRILOSEC) 40 MG capsule Take 40 mg by mouth daily.    Glory Rosebush VERIO test strip CHECK SUGAR AT HOME 4-5 TIMES A DAY  11  . oxyCODONE (OXYCONTIN) 15 mg 12 hr tablet Take 15 mg by mouth every 12 (twelve) hours.    Marland Kitchen oxyCODONE-acetaminophen (PERCOCET) 10-325 MG tablet Take 1 tablet by mouth 3 (three) times daily.     Marland Kitchen PARoxetine (PAXIL) 40 MG tablet Take 40 mg by mouth at bedtime.     . promethazine (PHENERGAN) 25 MG tablet Take 25 mg by mouth daily as needed for nausea or vomiting.   6  . Vitamin D, Ergocalciferol, (DRISDOL) 50000 UNITS CAPS capsule Take 50,000  Units by mouth once a week. mondays  1   No current facility-administered medications for this visit.     ROS:   General:  No weight loss, Fever, chills  HEENT: No recent headaches, no nasal bleeding, no visual changes, no sore throat  Neurologic: No dizziness, blackouts, seizures. No recent symptoms of stroke or mini- stroke. No recent episodes of slurred speech, or temporary blindness.  Cardiac: No recent episodes of chest pain/pressure, no shortness of breath at rest.  + shortness of breath with exertion.  Denies history of atrial fibrillation or irregular heartbeat  Vascular: No history of rest pain in feet.  No history of claudication.  + history of non-healing ulcer, No history of DVT   Pulmonary: No home oxygen, no productive cough, no hemoptysis,  No asthma or wheezing  Musculoskeletal:  [X]  Arthritis, [ ]  Low back pain,  [X]  Joint pain  Hematologic:No history of hypercoagulable state.  No history of easy bleeding.  No history of anemia  Gastrointestinal: No hematochezia or melena,  No gastroesophageal reflux, no trouble swallowing  Urinary: [ ]  chronic Kidney disease, [ ]  on HD - [ ]  MWF or [ ]  TTHS, [ ]  Burning with urination, [ ]  Frequent urination, [ ]  Difficulty urinating;   Skin: No rashes  Psychological: No history of anxiety,  No history of depression   Physical Examination  Vitals:   02/02/17 1132 02/02/17 1134  BP: (!) 147/59 (!) 153/65  Pulse: 61   Resp: 20   Temp: 97.9 F (36.6 C)   TempSrc: Oral   SpO2: 94%   Weight: 163 lb (73.9 kg)   Height: 5\' 3"  (1.6 m)     Body mass index is 28.87 kg/m.  General:  Alert and oriented, no acute distress HEENT: Normal Neck: No bruit or JVD Pulmonary: Clear to auscultation bilaterally Cardiac: Regular Rate and Rhythm without murmur Abdomen: Soft, non-tender, non-distended, no mass Skin: No rash, 1 x 1 cm opening apex of left groin incision no significant drainage BP granulation tissue at the base, 6 mm 2  mm depth ulcer posterior heel left side similar ulceration right side Extremity Pulses:  2+ radial, brachial, femoral, absent dorsalis pedis, posterior tibial pulses bilaterally Musculoskeletal: No deformity diffuse edema left lower extremity with healing incisions except as mentioned above  Neurologic: Upper and lower extremity motor 5/5 and symmetric  DATA:  I reviewed the patient's recent arteriogram which shows a SFA occlusion with reconstitution of the above-knee popliteal artery but this is diffusely diseased. The below-knee popliteal artery is a much better quality vessel with three-vessel runoff.  Patient most recent ABIs shows 0.4 on the right 0.9 on the left and a patent bypass in June of this year  ASSESSMENT:  Patient with nonhealing wound right foot. She needs a right femoral to below-knee popliteal bypass to assist with wound healing. She will need a repeat graft duplex of her left lower extremity in September of this year. We will repeat her ABIs at that point as well.   PLAN:  Right femoral to below-knee popliteal bypass most likely with PTFE. This is scheduled for 02/13/2017. Risks benefits possible, patient and procedure details were discussed patient today including but not limited to bleeding infection graft thrombosis limb loss. I also discussed with her that the patency of a PTFE graft is somewhat inferior to a vein graft. I quoted her a 50% 5 year patency rate for a PTFE graft compared to a 75% 5 year patency graft for a vein graft. She understands and agrees to proceed.  Ruta Hinds, MD [facility] Office: [phone] Pager: [phone]

## 2017-02-06 ENCOUNTER — Other Ambulatory Visit: Payer: Self-pay

## 2017-02-08 NOTE — Pre-Procedure Instructions (Signed)
[name]  02/08/2017      [id number] [place], [place] - [address] [facility] [place] [address] [place] [place] [address] Phone: [phone] Fax: [phone]    Your procedure is scheduled on   Monday  02/13/17  Report to [facility] Admitting at 530 A.M.  Call this number if you have problems the morning of surgery:  [phone]   Remember:  Do not eat food or drink liquids after midnight.  Take these medicines the morning of surgery with A SIP OF WATER  ALBUTEROL INHALER IF NEEDED, ALPRAZOLAM (XANAX) IF NEEDED, LEVOTHYROXINE (SYNTHROID), LYRICA, METOPROLOL (LOPRESSOR), OMEPRAZOLE (PRILOSEC), OXYCODONE IF NEEDED    How to Manage Your Diabetes Before and After Surgery  Why is it important to control my blood sugar before and after surgery? . Improving blood sugar levels before and after surgery helps healing and can limit problems. . A way of improving blood sugar control is eating a healthy diet by: o  Eating less sugar and carbohydrates o  Increasing activity/exercise o  Talking with your doctor about reaching your blood sugar goals . High blood sugars (greater than 180 mg/dL) can raise your risk of infections and slow your recovery, so you will need to focus on controlling your diabetes during the weeks before surgery. . Make sure that the doctor who takes care of your diabetes knows about your planned surgery including the date and location.  How do I manage my blood sugar before surgery? . Check your blood sugar at least 4 times a day, starting 2 days before surgery, to make sure that the level is not too high or low. o Check your blood sugar the morning of your surgery when you wake up and every 2 hours until you get to the Short Stay unit. . If your blood sugar is less than 70 mg/dL, you will need to treat for low blood sugar: o Do not take insulin. o Treat a low blood sugar (less than 70 mg/dL) with  cup of clear juice  (cranberry or apple), 4 glucose tablets, OR glucose gel. o Recheck blood sugar in 15 minutes after treatment (to make sure it is greater than 70 mg/dL). If your blood sugar is not greater than 70 mg/dL on recheck, call [phone] for further instructions. . Report your blood sugar to the short stay nurse when you get to Short Stay.  . If you are admitted to the hospital after surgery: o Your blood sugar will be checked by the staff and you will probably be given insulin after surgery (instead of oral diabetes medicines) to make sure you have good blood sugar levels. o The goal for blood sugar control after surgery is 80-180 mg/dL.              WHAT DO I DO ABOUT MY DIABETES MEDICATION?   Marland Kitchen Do not take oral diabetes medicines (pills) the morning of surgery.  . THE NIGHT BEFORE SURGERY, take ___________ units of ___________insulin.       Marland Kitchen HE MORNING OF SURGERY, take _____________ units of __________insulin.  . The day of surgery, do not take other diabetes injectables, including Byetta (exenatide), Bydureon (exenatide ER), Victoza (liraglutide), or Trulicity (dulaglutide).  . If your CBG is greater than 220 mg/dL, you may take  of your sliding scale (correction) dose of insulin.  Other Instructions:          Patient Signature:  Date:  Nurse Signature:  Date:   Reviewed and Endorsed by [facility] Patient Education Committee, August 2015  Do not wear jewelry, make-up or nail polish.  Do not wear lotions, powders, or perfumes, or deoderant.  Do not shave 48 hours prior to surgery.  Men may shave face and neck.  Do not bring valuables to the hospital.  [facility] is not responsible for any belongings or valuables.  Contacts, dentures or bridgework may not be worn into surgery.  Leave your suitcase in the car.  After surgery it may be brought to your room.  For patients admitted to the hospital, discharge time will be determined by your treatment team.  Patients  discharged the day of surgery will not be allowed to drive home.   Name and phone number of your driver:    Special instructions:  SEE PREPARING FOR SURGERY   Please read over the following fact sheets that you were given. Pain Booklet, MRSA Information and Surgical Site Infection Prevention

## 2017-02-09 ENCOUNTER — Encounter (HOSPITAL_COMMUNITY)
Admission: RE | Admit: 2017-02-09 | Discharge: 2017-02-09 | Disposition: A | Discharge status: Home / Self Care | Payer: Medicare Other | Source: Ambulatory Visit | Attending: Vascular Surgery | Admitting: Vascular Surgery

## 2017-02-09 ENCOUNTER — Telehealth: Payer: Self-pay

## 2017-02-09 ENCOUNTER — Encounter (HOSPITAL_COMMUNITY): Payer: Self-pay

## 2017-02-09 DIAGNOSIS — Z01812 Encounter for preprocedural laboratory examination: Secondary | ICD-10-CM | POA: Insufficient documentation

## 2017-02-09 DIAGNOSIS — N390 Urinary tract infection, site not specified: Secondary | ICD-10-CM

## 2017-02-09 HISTORY — DX: Disorder of arteries and arterioles, unspecified: I77.9

## 2017-02-09 HISTORY — DX: Peripheral vascular disease, unspecified: I73.9

## 2017-02-09 LAB — CBC
HEMATOCRIT: 33.8 % — AB (ref 36.0–46.0)
Hemoglobin: 11.2 g/dL — ABNORMAL LOW (ref 12.0–15.0)
MCH: 30 pg (ref 26.0–34.0)
MCHC: 33.1 g/dL (ref 30.0–36.0)
MCV: 90.6 fL (ref 78.0–100.0)
Platelets: 224 10*3/uL (ref 150–400)
RBC: 3.73 MIL/uL — AB (ref 3.87–5.11)
RDW: 14.5 % (ref 11.5–15.5)
WBC: 10.4 10*3/uL (ref 4.0–10.5)

## 2017-02-09 LAB — COMPREHENSIVE METABOLIC PANEL
ALBUMIN: 2.9 g/dL — AB (ref 3.5–5.0)
ALT: 24 U/L (ref 14–54)
AST: 28 U/L (ref 15–41)
Alkaline Phosphatase: 131 U/L — ABNORMAL HIGH (ref 38–126)
Anion gap: 7 (ref 5–15)
BILIRUBIN TOTAL: 0.6 mg/dL (ref 0.3–1.2)
BUN: 25 mg/dL — AB (ref 6–20)
CHLORIDE: 109 mmol/L (ref 101–111)
CO2: 24 mmol/L (ref 22–32)
CREATININE: 1.36 mg/dL — AB (ref 0.44–1.00)
Calcium: 8.3 mg/dL — ABNORMAL LOW (ref 8.9–10.3)
GFR calc Af Amer: 47 mL/min — ABNORMAL LOW (ref >60)
GFR, EST NON AFRICAN AMERICAN: 41 mL/min — AB (ref >60)
GLUCOSE: 169 mg/dL — AB (ref 65–99)
POTASSIUM: 4.3 mmol/L (ref 3.5–5.1)
Sodium: 140 mmol/L (ref 135–145)
Total Protein: 6.1 g/dL — ABNORMAL LOW (ref 6.5–8.1)

## 2017-02-09 LAB — URINALYSIS, ROUTINE W REFLEX MICROSCOPIC
BILIRUBIN URINE: NEGATIVE
Glucose, UA: NEGATIVE mg/dL
KETONES UR: NEGATIVE mg/dL
Nitrite: NEGATIVE
PROTEIN: 100 mg/dL — AB
Specific Gravity, Urine: 1.008 (ref 1.005–1.030)
pH: 5 (ref 5.0–8.0)

## 2017-02-09 LAB — PROTIME-INR
INR: 1.05
PROTHROMBIN TIME: 13.7 s (ref 11.4–15.2)

## 2017-02-09 LAB — SURGICAL PCR SCREEN
MRSA, PCR: NEGATIVE
Staphylococcus aureus: POSITIVE — AB

## 2017-02-09 LAB — APTT: APTT: 30 s (ref 24–36)

## 2017-02-09 LAB — GLUCOSE, CAPILLARY: GLUCOSE-CAPILLARY: 146 mg/dL — AB (ref 65–99)

## 2017-02-09 MED ORDER — CIPROFLOXACIN HCL 500 MG PO TABS: 500.0000 mg | ORAL_TABLET | Freq: Two times a day (BID) | ORAL | 0 refills | Status: DC

## 2017-02-09 NOTE — Telephone Encounter (Signed)
Notified per Pre-op RN of abnormal UA with PAT labs.  Attempted to call pt. at home re: abnormal UA.  Left voice message that an antibiotic will be sent to her pharmacy, to start immediately, and take per pharmacy instructions. Encouraged pt. to call the office to confirm she rec'd this voice message.

## 2017-02-09 NOTE — Progress Notes (Signed)
Spoke with Arbie Cookey at [facility].  They will look at patients' lab results

## 2017-02-10 ENCOUNTER — Encounter (HOSPITAL_COMMUNITY): Payer: Self-pay

## 2017-02-10 LAB — HEMOGLOBIN A1C
Hgb A1c MFr Bld: 7.3 % — ABNORMAL HIGH (ref 4.8–5.6)
MEAN PLASMA GLUCOSE: 163 mg/dL

## 2017-02-10 NOTE — Progress Notes (Addendum)
Anesthesia Chart Review: Patient is a 61 year old female scheduled for right FPBG on 02/13/17 by Dr. Ruta Hinds.   History includes former smoker (quit '04), CAD (s/p CX stent 06/11/02; s/p CABG LIMA-LAD, SVG-OM-CX, SVG-RCA 2/10/'04), murmur, HTN, GERD, hiatal hernia, DM2, CKD III, depression, anxiety, PAD s/p left femoral endarterectomy and left FBPG 11/23/16, carotid artery stenosis, HLD, hypothyroidism, asthma, anemia, arthritis, appendectomy, cholecystectomy, appendectomy.   Meds include albuterol, Xanax, amlodipine, ASA 81 mg, Lipitor, Pletal (on hold 02/06/17), Cipro (prescribed 02/09/17), fenofibrate, iron, Lasix, insulin glargine, levothyroxine, Lyrica, Lopressor, Narcan nasal spray kit, fish oil, Prilosec, Oxycontin, Percocet, Paxil.   - PCP is Nicholes Rough, PA ([place], [place]; see Care Everywhere). - Cardiologist is Kirk Ruths, MD, last visit 08/12/16.   - Cardiologist [name], MD, for PAD, last visit 01/17/17. He felt CAD was stable. Following 01/25/17 PV arteriogram, he recommended "Evaluate for right femoropopliteal bypass given the diffuse heavily calcified disease of the SFA and occlusion distally."  Preoperative labs reviewed.   - Cr 1.36, BUN 254.  Prior Cr results over last year range 1.27-1.99 since 11/2016. Known CKD stage III..  - Glucose 169. HbA1c 7.3. - H/H 11.2/33.8. T&S to be drawn on the day of surgery.  - UA large leukocytes, negative nitrites. [facility] is starting patient on Cipro.     EKG 06/07/16: NSR. Nonspecific ST and T wave abnormality  Echo 11/21/16:  - Left ventricle: The cavity size was normal. Wall thickness wasnormal. Systolic function was normal. The estimated ejectionfraction was in the range of 55% to 60%. Wall motion was normal;there were no regional wall motion abnormalities. Dopplerparameters are consistent with both elevated ventricularend-diastolic filling pressure and elevated left atrial fillingpressure. - Mitral  valve: Calcified annulus. - Left atrium: The atrium was mildly dilated. - Atrial septum: No defect or patent foramen ovale was identified. - Pulmonary arteries: PA peak pressure: 46 mm Hg (S).  Carotid duplex 07/01/16:  - Heterogeneous plaque, bilaterally. - Stable 40-59% RICA stenosis. - Stable 96-88% LICA stenosis. - >50% bilateral ECA stenosis. - Normal subclavian arteries, bilaterally. - Patent vertebral arteries with antegrade flow.  Nuclear stress test 08/12/14: Normal stress nuclear study. LV Wall Motion: NL LV Function; NL Wall Motion.  Patient with recent cardiology evaluation. She tolerated similar procedure ~ 3 months ago. If no acute changes then I would anticipate that she can proceed as planned.  George Hugh [facility] Short Stay Center/Anesthesiology Phone [phone] 02/10/2017 11:30 AM

## 2017-02-12 NOTE — Anesthesia Preprocedure Evaluation (Addendum)
Anesthesia Evaluation  Patient identified by MRN, date of birth, ID band Patient awake    Reviewed: Allergy & Precautions, H&P , NPO status , Patient's Chart, lab work & pertinent test results, reviewed documented beta blocker date and time   Airway Mallampati: II  TM Distance: >3 FB Neck ROM: Full    Dental  (+) Edentulous Upper, Edentulous Lower, Dental Advisory Given   Pulmonary asthma , former smoker,    Pulmonary exam normal        Cardiovascular Exercise Tolerance: Good hypertension, Pt. on medications and Pt. on home beta blockers + CAD, + CABG and + Peripheral Vascular Disease  Normal cardiovascular exam+ Valvular Problems/Murmurs      Neuro/Psych Depression negative neurological ROS     GI/Hepatic Neg liver ROS, GERD  Medicated and Controlled,  Endo/Other  diabetes, Type 2, Insulin DependentHypothyroidism   Renal/GU Renal InsufficiencyRenal disease  negative genitourinary   Musculoskeletal  (+) Arthritis , Osteoarthritis,    Abdominal   Peds  Hematology negative hematology ROS (+) anemia ,   Anesthesia Other Findings   Reproductive/Obstetrics negative OB ROS                           Lab Results  Component Value Date   WBC 10.4 02/09/2017   HGB 11.2 (L) 02/09/2017   HCT 33.8 (L) 02/09/2017   MCV 90.6 02/09/2017   PLT 224 02/09/2017   Lab Results  Component Value Date   CREATININE 1.36 (H) 02/09/2017   BUN 25 (H) 02/09/2017   NA 140 02/09/2017   K 4.3 02/09/2017   CL 109 02/09/2017   CO2 24 02/09/2017   11/2016 Study Conclusions  - Left ventricle: The cavity size was normal. Wall thickness was  normal. Systolic function was normal. The estimated ejection fraction was in the range of 55% to 60%. Wall motion was normal; there were no regional wall motion abnormalities. Doppler  parameters are consistent with both elevated ventricular  end-diastolic filling pressure and  elevated left atrial filling  pressure. - Mitral valve: Calcified annulus. - Left atrium: The atrium was mildly dilated. - Atrial septum: No defect or patent foramen ovale was identified. - Pulmonary arteries: PA peak pressure: 46 mm Hg (S).  Anesthesia Physical  Anesthesia Plan  ASA: III  Anesthesia Plan: General   Post-op Pain Management:    Induction: Intravenous  PONV Risk Score and Plan: 3 and Ondansetron, Dexamethasone, Midazolam and Treatment may vary due to age or medical condition  Airway Management Planned: Oral ETT  Additional Equipment:   Intra-op Plan:   Post-operative Plan: Extubation in OR  Informed Consent: I have reviewed the patients History and Physical, chart, labs and discussed the procedure including the risks, benefits and alternatives for the proposed anesthesia with the patient or authorized representative who has indicated his/her understanding and acceptance.   Dental advisory given  Plan Discussed with: CRNA  Anesthesia Plan Comments:       Anesthesia Quick Evaluation

## 2017-02-13 ENCOUNTER — Inpatient Hospital Stay (HOSPITAL_COMMUNITY): Payer: Medicare Other | Admitting: Vascular Surgery

## 2017-02-13 ENCOUNTER — Encounter (HOSPITAL_COMMUNITY): Payer: Self-pay | Admitting: *Deleted

## 2017-02-13 ENCOUNTER — Inpatient Hospital Stay (HOSPITAL_COMMUNITY)
Admission: RE | Admit: 2017-02-13 | Discharge: 2017-02-14 | DRG: 253 | Disposition: A | Discharge status: Home Health | Payer: Medicare Other | Source: Ambulatory Visit | Attending: Vascular Surgery | Admitting: Vascular Surgery

## 2017-02-13 ENCOUNTER — Inpatient Hospital Stay (HOSPITAL_COMMUNITY): Payer: Medicare Other | Admitting: Anesthesiology

## 2017-02-13 ENCOUNTER — Encounter (HOSPITAL_COMMUNITY)
Admission: RE | Disposition: A | Discharge status: Home Health | Payer: Self-pay | Source: Ambulatory Visit | Attending: Vascular Surgery

## 2017-02-13 DIAGNOSIS — Z87891 Personal history of nicotine dependence: Secondary | ICD-10-CM | POA: Diagnosis not present

## 2017-02-13 DIAGNOSIS — E1151 Type 2 diabetes mellitus with diabetic peripheral angiopathy without gangrene: Secondary | ICD-10-CM | POA: Diagnosis present

## 2017-02-13 DIAGNOSIS — F418 Other specified anxiety disorders: Secondary | ICD-10-CM | POA: Diagnosis present

## 2017-02-13 DIAGNOSIS — Z794 Long term (current) use of insulin: Secondary | ICD-10-CM | POA: Diagnosis not present

## 2017-02-13 DIAGNOSIS — I70234 Atherosclerosis of native arteries of right leg with ulceration of heel and midfoot: Secondary | ICD-10-CM | POA: Diagnosis present

## 2017-02-13 DIAGNOSIS — R011 Cardiac murmur, unspecified: Secondary | ICD-10-CM | POA: Diagnosis present

## 2017-02-13 DIAGNOSIS — Z9049 Acquired absence of other specified parts of digestive tract: Secondary | ICD-10-CM

## 2017-02-13 DIAGNOSIS — E785 Hyperlipidemia, unspecified: Secondary | ICD-10-CM | POA: Diagnosis present

## 2017-02-13 DIAGNOSIS — Z7982 Long term (current) use of aspirin: Secondary | ICD-10-CM

## 2017-02-13 DIAGNOSIS — Z79899 Other long term (current) drug therapy: Secondary | ICD-10-CM | POA: Diagnosis not present

## 2017-02-13 DIAGNOSIS — Z90710 Acquired absence of both cervix and uterus: Secondary | ICD-10-CM | POA: Diagnosis not present

## 2017-02-13 DIAGNOSIS — N183 Chronic kidney disease, stage 3 (moderate): Secondary | ICD-10-CM | POA: Diagnosis present

## 2017-02-13 DIAGNOSIS — I2510 Atherosclerotic heart disease of native coronary artery without angina pectoris: Secondary | ICD-10-CM | POA: Diagnosis present

## 2017-02-13 DIAGNOSIS — Z833 Family history of diabetes mellitus: Secondary | ICD-10-CM

## 2017-02-13 DIAGNOSIS — I129 Hypertensive chronic kidney disease with stage 1 through stage 4 chronic kidney disease, or unspecified chronic kidney disease: Secondary | ICD-10-CM | POA: Diagnosis present

## 2017-02-13 DIAGNOSIS — D62 Acute posthemorrhagic anemia: Secondary | ICD-10-CM | POA: Diagnosis not present

## 2017-02-13 DIAGNOSIS — Z95828 Presence of other vascular implants and grafts: Secondary | ICD-10-CM

## 2017-02-13 DIAGNOSIS — E669 Obesity, unspecified: Secondary | ICD-10-CM | POA: Diagnosis present

## 2017-02-13 DIAGNOSIS — E039 Hypothyroidism, unspecified: Secondary | ICD-10-CM | POA: Diagnosis present

## 2017-02-13 DIAGNOSIS — Z951 Presence of aortocoronary bypass graft: Secondary | ICD-10-CM

## 2017-02-13 DIAGNOSIS — I70211 Atherosclerosis of native arteries of extremities with intermittent claudication, right leg: Secondary | ICD-10-CM | POA: Diagnosis not present

## 2017-02-13 DIAGNOSIS — Z8249 Family history of ischemic heart disease and other diseases of the circulatory system: Secondary | ICD-10-CM

## 2017-02-13 DIAGNOSIS — E1122 Type 2 diabetes mellitus with diabetic chronic kidney disease: Secondary | ICD-10-CM | POA: Diagnosis present

## 2017-02-13 DIAGNOSIS — K219 Gastro-esophageal reflux disease without esophagitis: Secondary | ICD-10-CM | POA: Diagnosis present

## 2017-02-13 DIAGNOSIS — Z8371 Family history of colonic polyps: Secondary | ICD-10-CM | POA: Diagnosis not present

## 2017-02-13 DIAGNOSIS — I739 Peripheral vascular disease, unspecified: Secondary | ICD-10-CM | POA: Diagnosis present

## 2017-02-13 HISTORY — PX: FEMORAL-POPLITEAL BYPASS GRAFT: SHX937

## 2017-02-13 HISTORY — PX: PATCH ANGIOPLASTY: SHX6230

## 2017-02-13 HISTORY — PX: ENDARTERECTOMY FEMORAL: SHX5804

## 2017-02-13 HISTORY — PX: APPLICATION OF WOUND VAC: SHX5189

## 2017-02-13 LAB — GLUCOSE, CAPILLARY
GLUCOSE-CAPILLARY: 207 mg/dL — AB (ref 65–99)
Glucose-Capillary: 154 mg/dL — ABNORMAL HIGH (ref 65–99)
Glucose-Capillary: 229 mg/dL — ABNORMAL HIGH (ref 65–99)
Glucose-Capillary: 326 mg/dL — ABNORMAL HIGH (ref 65–99)

## 2017-02-13 LAB — CREATININE, SERUM
CREATININE: 1.96 mg/dL — AB (ref 0.44–1.00)
GFR calc Af Amer: 30 mL/min — ABNORMAL LOW (ref >60)
GFR, EST NON AFRICAN AMERICAN: 26 mL/min — AB (ref >60)

## 2017-02-13 LAB — CBC
HCT: 19.8 % — ABNORMAL LOW (ref 36.0–46.0)
Hemoglobin: 6.5 g/dL — CL (ref 12.0–15.0)
MCH: 30 pg (ref 26.0–34.0)
MCHC: 32.8 g/dL (ref 30.0–36.0)
MCV: 91.2 fL (ref 78.0–100.0)
PLATELETS: 177 10*3/uL (ref 150–400)
RBC: 2.17 MIL/uL — ABNORMAL LOW (ref 3.87–5.11)
RDW: 14.2 % (ref 11.5–15.5)
WBC: 12.6 10*3/uL — AB (ref 4.0–10.5)

## 2017-02-13 LAB — PREPARE RBC (CROSSMATCH)

## 2017-02-13 SURGERY — BYPASS GRAFT FEMORAL-POPLITEAL ARTERY
Anesthesia: General | Site: Groin | Laterality: Right

## 2017-02-13 MED ORDER — CHLORHEXIDINE GLUCONATE 4 % EX LIQD: 60.0000 mL | Freq: Once | CUTANEOUS | Status: DC

## 2017-02-13 MED ORDER — DOCUSATE SODIUM 100 MG PO CAPS
100.0000 mg | ORAL_CAPSULE | Freq: Every day | ORAL | Status: DC
  Administered 2017-02-14: 100 mg via ORAL
  Filled 2017-02-13: qty 1

## 2017-02-13 MED ORDER — PHENOL 1.4 % MT LIQD: 1.0000 | OROMUCOSAL | Status: DC | PRN

## 2017-02-13 MED ORDER — ATORVASTATIN CALCIUM 80 MG PO TABS
80.0000 mg | ORAL_TABLET | ORAL | Status: DC
  Administered 2017-02-14: 80 mg via ORAL
  Filled 2017-02-13: qty 1

## 2017-02-13 MED ORDER — FENTANYL CITRATE (PF) 100 MCG/2ML IJ SOLN
INTRAMUSCULAR | Status: DC | PRN
Start: 2017-02-13 — End: 2017-02-13
  Administered 2017-02-13: 25 ug via INTRAVENOUS
  Administered 2017-02-13: 50 ug via INTRAVENOUS
  Administered 2017-02-13: 100 ug via INTRAVENOUS
  Administered 2017-02-13: 25 ug via INTRAVENOUS

## 2017-02-13 MED ORDER — INSULIN ASPART 100 UNIT/ML ~~LOC~~ SOLN
0.0000 [IU] | Freq: Three times a day (TID) | SUBCUTANEOUS | Status: DC
  Administered 2017-02-14: 5 [IU] via SUBCUTANEOUS
  Administered 2017-02-14: 8 [IU] via SUBCUTANEOUS

## 2017-02-13 MED ORDER — ONDANSETRON HCL 4 MG/2ML IJ SOLN
INTRAMUSCULAR | Status: DC | PRN
  Administered 2017-02-13 (×2): 4 mg via INTRAVENOUS

## 2017-02-13 MED ORDER — ALBUTEROL SULFATE (2.5 MG/3ML) 0.083% IN NEBU: 2.5000 mg | INHALATION_SOLUTION | Freq: Four times a day (QID) | RESPIRATORY_TRACT | Status: DC | PRN

## 2017-02-13 MED ORDER — PANTOPRAZOLE SODIUM 40 MG PO TBEC
40.0000 mg | DELAYED_RELEASE_TABLET | Freq: Every day | ORAL | Status: DC
  Administered 2017-02-14: 40 mg via ORAL
  Filled 2017-02-13: qty 1

## 2017-02-13 MED ORDER — ALUM & MAG HYDROXIDE-SIMETH 200-200-20 MG/5ML PO SUSP
15.0000 mL | ORAL | Status: DC | PRN
Start: 2017-02-13 — End: 2017-02-14

## 2017-02-13 MED ORDER — POTASSIUM CHLORIDE CRYS ER 20 MEQ PO TBCR: 20.0000 meq | EXTENDED_RELEASE_TABLET | Freq: Every day | ORAL | Status: DC | PRN

## 2017-02-13 MED ORDER — PHENYLEPHRINE HCL 10 MG/ML IJ SOLN
INTRAVENOUS | Status: DC | PRN
  Administered 2017-02-13: 100 ug/min via INTRAVENOUS

## 2017-02-13 MED ORDER — DEXAMETHASONE SODIUM PHOSPHATE 10 MG/ML IJ SOLN
INTRAMUSCULAR | Status: DC | PRN
  Administered 2017-02-13: 5 mg via INTRAVENOUS

## 2017-02-13 MED ORDER — SODIUM CHLORIDE 0.9 % IV SOLN: INTRAVENOUS | Status: DC

## 2017-02-13 MED ORDER — VITAMIN D (ERGOCALCIFEROL) 1.25 MG (50000 UNIT) PO CAPS
50000.0000 [IU] | ORAL_CAPSULE | ORAL | Status: DC
Start: 2017-02-14 — End: 2017-02-14
  Administered 2017-02-13: 50000 [IU] via ORAL
  Filled 2017-02-13: qty 1

## 2017-02-13 MED ORDER — EPHEDRINE SULFATE 50 MG/ML IJ SOLN
INTRAMUSCULAR | Status: DC | PRN
  Administered 2017-02-13: 20 mg via INTRAVENOUS
  Administered 2017-02-13: 10 mg via INTRAVENOUS
  Administered 2017-02-13: 20 mg via INTRAVENOUS
  Administered 2017-02-13 (×5): 10 mg via INTRAVENOUS

## 2017-02-13 MED ORDER — MAGNESIUM SULFATE 2 GM/50ML IV SOLN
2.0000 g | Freq: Every day | INTRAVENOUS | Status: DC | PRN
  Filled 2017-02-13: qty 50

## 2017-02-13 MED ORDER — OXYCODONE-ACETAMINOPHEN 10-325 MG PO TABS: 1.0000 | ORAL_TABLET | Freq: Three times a day (TID) | ORAL | Status: DC | PRN

## 2017-02-13 MED ORDER — ALBUMIN HUMAN 5 % IV SOLN
12.5000 g | Freq: Once | INTRAVENOUS | Status: AC
  Administered 2017-02-13: 12.5 g via INTRAVENOUS

## 2017-02-13 MED ORDER — SODIUM CHLORIDE 0.9 % IV SOLN: 500.0000 mL | Freq: Once | INTRAVENOUS | Status: DC | PRN

## 2017-02-13 MED ORDER — SODIUM CHLORIDE 0.9 % IV SOLN: Freq: Once | INTRAVENOUS | Status: DC

## 2017-02-13 MED ORDER — AMLODIPINE BESYLATE 10 MG PO TABS: 10.0000 mg | ORAL_TABLET | Freq: Every day | ORAL | Status: DC

## 2017-02-13 MED ORDER — CILOSTAZOL 100 MG PO TABS
100.0000 mg | ORAL_TABLET | Freq: Two times a day (BID) | ORAL | Status: DC
  Administered 2017-02-13 – 2017-02-14 (×2): 100 mg via ORAL
  Filled 2017-02-13 (×4): qty 1

## 2017-02-13 MED ORDER — 0.9 % SODIUM CHLORIDE (POUR BTL) OPTIME
TOPICAL | Status: DC | PRN
Start: 2017-02-13 — End: 2017-02-13
  Administered 2017-02-13: 2000 mL

## 2017-02-13 MED ORDER — ALBUMIN HUMAN 5 % IV SOLN
INTRAVENOUS | Status: DC | PRN
  Administered 2017-02-13: 12:00:00 via INTRAVENOUS

## 2017-02-13 MED ORDER — FUROSEMIDE 40 MG PO TABS
40.0000 mg | ORAL_TABLET | Freq: Two times a day (BID) | ORAL | Status: DC
  Administered 2017-02-14: 40 mg via ORAL
  Filled 2017-02-13: qty 1

## 2017-02-13 MED ORDER — MORPHINE SULFATE (PF) 2 MG/ML IV SOLN
2.0000 mg | INTRAVENOUS | Status: DC | PRN
  Administered 2017-02-13 (×2): 2 mg via INTRAVENOUS
  Filled 2017-02-13 (×2): qty 1

## 2017-02-13 MED ORDER — FERROUS SULFATE 325 (65 FE) MG PO TABS
325.0000 mg | ORAL_TABLET | Freq: Three times a day (TID) | ORAL | Status: DC
  Administered 2017-02-13 – 2017-02-14 (×3): 325 mg via ORAL
  Filled 2017-02-13 (×3): qty 1

## 2017-02-13 MED ORDER — PROTAMINE SULFATE 10 MG/ML IV SOLN
INTRAVENOUS | Status: AC
  Filled 2017-02-13: qty 5

## 2017-02-13 MED ORDER — BISACODYL 10 MG RE SUPP: 10.0000 mg | Freq: Every day | RECTAL | Status: DC | PRN

## 2017-02-13 MED ORDER — CIPROFLOXACIN HCL 500 MG PO TABS
500.0000 mg | ORAL_TABLET | Freq: Two times a day (BID) | ORAL | Status: DC
  Filled 2017-02-13 (×2): qty 1

## 2017-02-13 MED ORDER — HEPARIN SODIUM (PORCINE) 1000 UNIT/ML IJ SOLN
INTRAMUSCULAR | Status: DC | PRN
  Administered 2017-02-13: 8000 [IU] via INTRAVENOUS
  Administered 2017-02-13: 4000 [IU] via INTRAVENOUS

## 2017-02-13 MED ORDER — INSULIN GLARGINE 100 UNIT/ML ~~LOC~~ SOLN
25.0000 [IU] | Freq: Two times a day (BID) | SUBCUTANEOUS | Status: DC
  Administered 2017-02-13 – 2017-02-14 (×2): 25 [IU] via SUBCUTANEOUS
  Filled 2017-02-13 (×3): qty 0.25

## 2017-02-13 MED ORDER — FENTANYL CITRATE (PF) 250 MCG/5ML IJ SOLN
INTRAMUSCULAR | Status: AC
  Filled 2017-02-13: qty 5

## 2017-02-13 MED ORDER — PHENYLEPHRINE 40 MCG/ML (10ML) SYRINGE FOR IV PUSH (FOR BLOOD PRESSURE SUPPORT)
PREFILLED_SYRINGE | INTRAVENOUS | Status: AC
  Filled 2017-02-13: qty 10

## 2017-02-13 MED ORDER — METOPROLOL TARTRATE 100 MG PO TABS: 100.0000 mg | ORAL_TABLET | Freq: Two times a day (BID) | ORAL | Status: DC

## 2017-02-13 MED ORDER — ALBUMIN HUMAN 5 % IV SOLN
INTRAVENOUS | Status: AC
  Filled 2017-02-13: qty 250

## 2017-02-13 MED ORDER — ACETAMINOPHEN 650 MG RE SUPP: 325.0000 mg | RECTAL | Status: DC | PRN

## 2017-02-13 MED ORDER — ACETAMINOPHEN 325 MG PO TABS: 325.0000 mg | ORAL_TABLET | ORAL | Status: DC | PRN

## 2017-02-13 MED ORDER — OXYCODONE HCL ER 15 MG PO T12A
15.0000 mg | EXTENDED_RELEASE_TABLET | Freq: Two times a day (BID) | ORAL | Status: DC
  Administered 2017-02-13 – 2017-02-14 (×2): 15 mg via ORAL
  Filled 2017-02-13 (×2): qty 1

## 2017-02-13 MED ORDER — METOPROLOL TARTRATE 50 MG PO TABS
150.0000 mg | ORAL_TABLET | Freq: Two times a day (BID) | ORAL | Status: DC
  Administered 2017-02-14: 10:00:00 150 mg via ORAL
  Filled 2017-02-13 (×2): qty 1

## 2017-02-13 MED ORDER — METOPROLOL TARTRATE 50 MG PO TABS: 50.0000 mg | ORAL_TABLET | Freq: Two times a day (BID) | ORAL | Status: DC

## 2017-02-13 MED ORDER — ONDANSETRON HCL 4 MG/2ML IJ SOLN
INTRAMUSCULAR | Status: AC
  Filled 2017-02-13: qty 2

## 2017-02-13 MED ORDER — PAROXETINE HCL 20 MG PO TABS
40.0000 mg | ORAL_TABLET | Freq: Every day | ORAL | Status: DC
  Administered 2017-02-13: 40 mg via ORAL
  Filled 2017-02-13: qty 2

## 2017-02-13 MED ORDER — ALPRAZOLAM 0.5 MG PO TABS: 1.0000 mg | ORAL_TABLET | Freq: Two times a day (BID) | ORAL | Status: DC | PRN

## 2017-02-13 MED ORDER — LACTATED RINGERS IV SOLN
INTRAVENOUS | Status: DC | PRN
  Administered 2017-02-13 (×3): via INTRAVENOUS

## 2017-02-13 MED ORDER — PHENYLEPHRINE HCL 10 MG/ML IJ SOLN
INTRAMUSCULAR | Status: DC | PRN
  Administered 2017-02-13: 40 ug via INTRAVENOUS
  Administered 2017-02-13: 200 ug via INTRAVENOUS
  Administered 2017-02-13: 80 ug via INTRAVENOUS
  Administered 2017-02-13: 160 ug via INTRAVENOUS
  Administered 2017-02-13: 120 ug via INTRAVENOUS
  Administered 2017-02-13: 200 ug via INTRAVENOUS

## 2017-02-13 MED ORDER — FUROSEMIDE 10 MG/ML IJ SOLN
20.0000 mg | Freq: Once | INTRAMUSCULAR | Status: AC
  Administered 2017-02-14: 20 mg via INTRAVENOUS
  Filled 2017-02-13: qty 2

## 2017-02-13 MED ORDER — LEVOTHYROXINE SODIUM 75 MCG PO TABS
175.0000 ug | ORAL_TABLET | Freq: Every day | ORAL | Status: DC
  Administered 2017-02-14: 10:00:00 175 ug via ORAL
  Filled 2017-02-13: qty 1

## 2017-02-13 MED ORDER — ONDANSETRON HCL 4 MG/2ML IJ SOLN: 4.0000 mg | Freq: Four times a day (QID) | INTRAMUSCULAR | Status: DC | PRN

## 2017-02-13 MED ORDER — MIDAZOLAM HCL 5 MG/5ML IJ SOLN
INTRAMUSCULAR | Status: DC | PRN
  Administered 2017-02-13: 2 mg via INTRAVENOUS

## 2017-02-13 MED ORDER — DEXTROSE 5 % IV SOLN
1.5000 g | Freq: Two times a day (BID) | INTRAVENOUS | Status: AC
  Administered 2017-02-13 – 2017-02-14 (×2): 1.5 g via INTRAVENOUS
  Filled 2017-02-13 (×2): qty 1.5

## 2017-02-13 MED ORDER — PROPOFOL 10 MG/ML IV BOLUS
INTRAVENOUS | Status: DC | PRN
  Administered 2017-02-13: 150 mg via INTRAVENOUS

## 2017-02-13 MED ORDER — ROCURONIUM BROMIDE 100 MG/10ML IV SOLN
INTRAVENOUS | Status: DC | PRN
  Administered 2017-02-13: 20 mg via INTRAVENOUS
  Administered 2017-02-13: 40 mg via INTRAVENOUS

## 2017-02-13 MED ORDER — OXYCODONE-ACETAMINOPHEN 5-325 MG PO TABS
1.0000 | ORAL_TABLET | Freq: Three times a day (TID) | ORAL | Status: DC | PRN
  Administered 2017-02-13: 1 via ORAL
  Filled 2017-02-13: qty 1

## 2017-02-13 MED ORDER — HYDROMORPHONE HCL 1 MG/ML IJ SOLN: 0.2500 mg | INTRAMUSCULAR | Status: DC | PRN

## 2017-02-13 MED ORDER — SUCCINYLCHOLINE CHLORIDE 200 MG/10ML IV SOSY
PREFILLED_SYRINGE | INTRAVENOUS | Status: AC
  Filled 2017-02-13: qty 10

## 2017-02-13 MED ORDER — METOPROLOL TARTRATE 5 MG/5ML IV SOLN: 2.0000 mg | INTRAVENOUS | Status: DC | PRN

## 2017-02-13 MED ORDER — PREGABALIN 25 MG PO CAPS: 75.0000 mg | ORAL_CAPSULE | Freq: Every evening | ORAL | Status: DC | PRN

## 2017-02-13 MED ORDER — CEFUROXIME SODIUM 1.5 G IV SOLR
INTRAVENOUS | Status: AC
  Filled 2017-02-13: qty 1.5

## 2017-02-13 MED ORDER — HEPARIN SODIUM (PORCINE) 5000 UNIT/ML IJ SOLN
5000.0000 [IU] | Freq: Three times a day (TID) | INTRAMUSCULAR | Status: DC
  Administered 2017-02-14: 5000 [IU] via SUBCUTANEOUS
  Filled 2017-02-13: qty 1

## 2017-02-13 MED ORDER — HEPARIN SODIUM (PORCINE) 5000 UNIT/ML IJ SOLN
INTRAMUSCULAR | Status: DC | PRN
Start: 2017-02-13 — End: 2017-02-13
  Administered 2017-02-13: 08:00:00

## 2017-02-13 MED ORDER — HEPARIN SODIUM (PORCINE) 1000 UNIT/ML IJ SOLN
INTRAMUSCULAR | Status: AC
  Filled 2017-02-13: qty 2

## 2017-02-13 MED ORDER — CIPROFLOXACIN HCL 500 MG PO TABS
250.0000 mg | ORAL_TABLET | Freq: Two times a day (BID) | ORAL | Status: DC
  Administered 2017-02-14: 250 mg via ORAL
  Filled 2017-02-13: qty 1

## 2017-02-13 MED ORDER — LIDOCAINE HCL (CARDIAC) 20 MG/ML IV SOLN
INTRAVENOUS | Status: DC | PRN
  Administered 2017-02-13: 60 mg via INTRAVENOUS

## 2017-02-13 MED ORDER — DEXTROSE 5 % IV SOLN
1.5000 g | INTRAVENOUS | Status: AC
  Administered 2017-02-13: 1.5 g via INTRAVENOUS

## 2017-02-13 MED ORDER — DEXAMETHASONE SODIUM PHOSPHATE 10 MG/ML IJ SOLN
INTRAMUSCULAR | Status: AC
  Filled 2017-02-13: qty 1

## 2017-02-13 MED ORDER — PROPOFOL 10 MG/ML IV BOLUS
INTRAVENOUS | Status: AC
  Filled 2017-02-13: qty 20

## 2017-02-13 MED ORDER — POLYETHYLENE GLYCOL 3350 17 G PO PACK: 17.0000 g | PACK | Freq: Every day | ORAL | Status: DC | PRN

## 2017-02-13 MED ORDER — PROMETHAZINE HCL 25 MG PO TABS: 25.0000 mg | ORAL_TABLET | Freq: Every day | ORAL | Status: DC | PRN

## 2017-02-13 MED ORDER — HYDRALAZINE HCL 20 MG/ML IJ SOLN: 5.0000 mg | INTRAMUSCULAR | Status: DC | PRN

## 2017-02-13 MED ORDER — GUAIFENESIN-DM 100-10 MG/5ML PO SYRP
15.0000 mL | ORAL_SOLUTION | ORAL | Status: DC | PRN
  Administered 2017-02-14: 15 mL via ORAL
  Filled 2017-02-13: qty 15

## 2017-02-13 MED ORDER — OXYCODONE HCL 5 MG PO TABS
5.0000 mg | ORAL_TABLET | Freq: Three times a day (TID) | ORAL | Status: DC | PRN
  Administered 2017-02-14: 5 mg via ORAL
  Filled 2017-02-13 (×2): qty 1

## 2017-02-13 MED ORDER — MIDAZOLAM HCL 2 MG/2ML IJ SOLN
INTRAMUSCULAR | Status: AC
  Filled 2017-02-13: qty 2

## 2017-02-13 MED ORDER — HEMOSTATIC AGENTS (NO CHARGE) OPTIME
TOPICAL | Status: DC | PRN
  Administered 2017-02-13: 3 via TOPICAL

## 2017-02-13 MED ORDER — EPHEDRINE 5 MG/ML INJ
INTRAVENOUS | Status: AC
  Filled 2017-02-13: qty 10

## 2017-02-13 MED ORDER — LABETALOL HCL 5 MG/ML IV SOLN: 10.0000 mg | INTRAVENOUS | Status: DC | PRN

## 2017-02-13 MED ORDER — ASPIRIN EC 81 MG PO TBEC: 81.0000 mg | DELAYED_RELEASE_TABLET | Freq: Every day | ORAL | Status: DC

## 2017-02-13 MED ORDER — PROTAMINE SULFATE 10 MG/ML IV SOLN
INTRAVENOUS | Status: DC | PRN
  Administered 2017-02-13 (×10): 10 mg via INTRAVENOUS

## 2017-02-13 SURGICAL SUPPLY — 58 items
CANISTER SUCT 3000ML PPV (MISCELLANEOUS) ×3 IMPLANT
CANNULA VESSEL 3MM 2 BLNT TIP (CANNULA) ×6 IMPLANT
CLIP VESOCCLUDE MED 24/CT (CLIP) ×3 IMPLANT
CLIP VESOCCLUDE SM WIDE 24/CT (CLIP) ×3 IMPLANT
DERMABOND ADVANCED (GAUZE/BANDAGES/DRESSINGS) ×1
DERMABOND ADVANCED .7 DNX12 (GAUZE/BANDAGES/DRESSINGS) ×2 IMPLANT
DRAPE INCISE IOBAN 66X45 STRL (DRAPES) ×3 IMPLANT
DRESSING PREVENA PLUS CUSTOM (GAUZE/BANDAGES/DRESSINGS) ×2 IMPLANT
DRSG PREVENA PLUS CUSTOM (GAUZE/BANDAGES/DRESSINGS) ×3
ELECT CAUTERY BLADE 6.4 (BLADE) ×3 IMPLANT
ELECT REM PT RETURN 9FT ADLT (ELECTROSURGICAL) ×3
ELECTRODE REM PT RTRN 9FT ADLT (ELECTROSURGICAL) ×2 IMPLANT
GAUZE SPONGE 4X4 16PLY XRAY LF (GAUZE/BANDAGES/DRESSINGS) ×6 IMPLANT
GLOVE BIO SURGEON STRL SZ 6.5 (GLOVE) ×12 IMPLANT
GLOVE BIO SURGEON STRL SZ7 (GLOVE) ×6 IMPLANT
GLOVE BIO SURGEON STRL SZ7.5 (GLOVE) ×3 IMPLANT
GLOVE BIOGEL PI IND STRL 6.5 (GLOVE) ×6 IMPLANT
GLOVE BIOGEL PI IND STRL 7.0 (GLOVE) ×6 IMPLANT
GLOVE BIOGEL PI IND STRL 7.5 (GLOVE) ×4 IMPLANT
GLOVE BIOGEL PI INDICATOR 6.5 (GLOVE) ×3
GLOVE BIOGEL PI INDICATOR 7.0 (GLOVE) ×3
GLOVE BIOGEL PI INDICATOR 7.5 (GLOVE) ×2
GLOVE SURG SS PI 6.5 STRL IVOR (GLOVE) ×6 IMPLANT
GLOVE SURG SS PI 7.0 STRL IVOR (GLOVE) ×9 IMPLANT
GOWN STRL REUS W/ TWL LRG LVL3 (GOWN DISPOSABLE) ×14 IMPLANT
GOWN STRL REUS W/ TWL LRG LVL4 (GOWN DISPOSABLE) ×2 IMPLANT
GOWN STRL REUS W/ TWL XL LVL3 (GOWN DISPOSABLE) ×4 IMPLANT
GOWN STRL REUS W/TWL LRG LVL3 (GOWN DISPOSABLE) ×7
GOWN STRL REUS W/TWL LRG LVL4 (GOWN DISPOSABLE) ×1
GOWN STRL REUS W/TWL XL LVL3 (GOWN DISPOSABLE) ×2
GRAFT PROPATEN THIN WALL 6X80 (Vascular Products) ×3 IMPLANT
HEMOSTAT SPONGE AVITENE ULTRA (HEMOSTASIS) ×9 IMPLANT
KIT BASIN OR (CUSTOM PROCEDURE TRAY) ×3 IMPLANT
KIT ROOM TURNOVER OR (KITS) ×3 IMPLANT
LOOP VESSEL MINI RED (MISCELLANEOUS) ×3 IMPLANT
MARKER GRAFT CORONARY BYPASS (MISCELLANEOUS) ×3 IMPLANT
NS IRRIG 1000ML POUR BTL (IV SOLUTION) ×6 IMPLANT
PACK PERIPHERAL VASCULAR (CUSTOM PROCEDURE TRAY) ×3 IMPLANT
PAD ARMBOARD 7.5X6 YLW CONV (MISCELLANEOUS) ×6 IMPLANT
PATCH HEMASHIELD 8X75 (Vascular Products) ×3 IMPLANT
PENCIL BUTTON HOLSTER BLD 10FT (ELECTRODE) ×3 IMPLANT
SPONGE INTESTINAL PEANUT (DISPOSABLE) ×3 IMPLANT
SPONGE LAP 18X18 X RAY DECT (DISPOSABLE) ×9 IMPLANT
SUT PROLENE 5 0 C 1 24 (SUTURE) ×12 IMPLANT
SUT PROLENE 6 0 CC (SUTURE) ×36 IMPLANT
SUT PROLENE 7 0 BV1 MDA (SUTURE) ×6 IMPLANT
SUT SILK 2 0 SH (SUTURE) ×3 IMPLANT
SUT SILK 3 0 (SUTURE) ×1
SUT SILK 3-0 18XBRD TIE 12 (SUTURE) ×2 IMPLANT
SUT VIC AB 2-0 SH 27 (SUTURE) ×2
SUT VIC AB 2-0 SH 27XBRD (SUTURE) ×4 IMPLANT
SUT VIC AB 3-0 SH 27 (SUTURE) ×3
SUT VIC AB 3-0 SH 27X BRD (SUTURE) ×6 IMPLANT
SUT VIC AB 4-0 PS2 27 (SUTURE) ×6 IMPLANT
TAPE UMBILICAL COTTON 1/8X30 (MISCELLANEOUS) ×3 IMPLANT
TRAY FOLEY W/METER SILVER 16FR (SET/KITS/TRAYS/PACK) ×3 IMPLANT
UNDERPAD 30X30 (UNDERPADS AND DIAPERS) ×3 IMPLANT
WATER STERILE IRR 1000ML POUR (IV SOLUTION) ×3 IMPLANT

## 2017-02-13 NOTE — Anesthesia Procedure Notes (Signed)
Procedure Name: Intubation Date/Time: 02/13/2017 7:41 AM Performed by: Carney Living Pre-anesthesia Checklist: Patient identified, Emergency Drugs available, Suction available, Patient being monitored and Timeout performed Patient Re-evaluated:Patient Re-evaluated prior to induction Oxygen Delivery Method: Circle system utilized Preoxygenation: Pre-oxygenation with 100% oxygen Induction Type: IV induction Ventilation: Mask ventilation without difficulty and Oral airway inserted - appropriate to patient size Laryngoscope Size: Mac and 4 Grade View: Grade I Tube type: Oral Tube size: 7.0 mm Number of attempts: 1 Airway Equipment and Method: Stylet Placement Confirmation: ETT inserted through vocal cords under direct vision,  positive ETCO2 and breath sounds checked- equal and bilateral Secured at: 21 cm Tube secured with: Tape Dental Injury: Teeth and Oropharynx as per pre-operative assessment

## 2017-02-13 NOTE — Interval H&P Note (Signed)
History and Physical Interval Note:  02/13/2017 7:26 AM  [name]  has presented today for surgery, with the diagnosis of Peripheral Vascular Disese with nonhealing wound Right foot I70.234  The various methods of treatment have been discussed with the patient and family. After consideration of risks, benefits and other options for treatment, the patient has consented to  Procedure(s): BYPASS GRAFT FEMORAL-BELOW KNEE POPLITEAL ARTERY (Right) as a surgical intervention .  The patient's history has been reviewed, patient examined, no change in status, stable for surgery.  I have reviewed the patient's chart and labs.  Questions were answered to the patient's satisfaction.     Ruta Hinds

## 2017-02-13 NOTE — H&P (View-Only) (Signed)
Referring Physician: Dr. Fletcher Anon Patient name: [name] MRN: [id number] DOB: 18-Dec-1955 Sex: female  REASON FOR CONSULT: Nonhealing wound right foot with peripheral arterial disease  HPI: [name] is a 61 y.o. female who previously underwent left femoral to below-knee popliteal bypass with a vein graft approximately 10 weeks ago. This was done for a nonhealing wound on her posterior heel. This is slowly healing. She still has some residual swelling. The groin incision is not completely healed but not really draining. She now is referred for right femoral popliteal bypass grafting again for a nonhealing wound on her right foot. Recent arteriogram by Dr. Fletcher Anon shows anatomy suitable for a right femoral below-knee popliteal bypass. She has had previous right leg vein harvest for coronary grafting. She states she has quit smoking. Other medical problems include CK D3, coronary artery disease, diabetes, hypertension, all of which are currently stable.  Past Medical History:  Diagnosis Date  . Adenomatous polyp of colon   . Anemia   . Arthritis   . Asthma   . Carpal tunnel syndrome   . Chronic renal insufficiency   . Coronary artery disease   . Depression   . Depression with anxiety   . Diabetes mellitus    type II  . Diverticulosis of colon (without mention of hemorrhage)   . GERD (gastroesophageal reflux disease)   . Heart murmur   . History of hiatal hernia   . Hyperlipidemia   . Hypertension   . Hypothyroidism   . Obesity   . PVD (peripheral vascular disease) ([place])   . Thyroid disease    hypo   Past Surgical History:  Procedure Laterality Date  . ABDOMINAL AORTAGRAM N/A 12/18/2013   Procedure: ABDOMINAL [name];  Surgeon: Wellington Hampshire, MD;  Location: [place] CATH LAB;  Service: Cardiovascular;  Laterality: N/A;  . ABDOMINAL AORTOGRAM W/LOWER EXTREMITY N/A 11/16/2016   Procedure: Abdominal Aortogram w/Lower Extremity;  Surgeon: Wellington Hampshire, MD;  Location: [place] CV LAB;  Service: Cardiovascular;  Laterality: N/A;  . ABDOMINAL AORTOGRAM W/LOWER EXTREMITY N/A 01/25/2017   Procedure: Abdominal Aortogram w/Lower Extremity;  Surgeon: Wellington Hampshire, MD;  Location: [place] CV LAB;  Service: Cardiovascular;  Laterality: N/A;  only completed Lower Extremity  . ABDOMINAL HYSTERECTOMY    . APPENDECTOMY    . CHOLECYSTECTOMY    . CORONARY ARTERY BYPASS GRAFT  2004   x4  . ENDARTERECTOMY FEMORAL Left 11/23/2016   Procedure: ENDARTERECTOMY OF LEFT EXTERNAL COMMON FEMORAL ARTERY WITH EXTENDED LEFT PROFUNDOPLASTY;  Surgeon: Elam Dutch, MD;  Location: [place];  Service: Vascular;  Laterality: Left;  . FEMORAL-POPLITEAL BYPASS GRAFT Left 11/23/2016   Procedure: LEFT FEMORAL-BELOW KNEE POPLITEAL ARTERY BYPASS;  Surgeon: Elam Dutch, MD;  Location: [place];  Service: Vascular;  Laterality: Left;  . KNEE SURGERY Right 2013   arthroscopy    Family History  Problem Relation Age of Onset  . Diabetes Mother   . Heart disease Father   . Colon polyps Daughter   . Colon cancer Neg Hx   . Esophageal cancer Neg Hx   . Stomach cancer Neg Hx   . Rectal cancer Neg Hx     SOCIAL HISTORY: Social History   Social History  . Marital status: Widowed    Spouse name: N/A  . Number of children: 2  . Years of education: N/A   Occupational History  . [employer]    retired  Social History Main Topics  . Smoking status: Former Smoker    Quit date: 2004  . Smokeless tobacco: Never Used  . Alcohol use No  . Drug use: No  . Sexual activity: Not on file   Other Topics Concern  . Not on file   Social History Narrative   Daily Caffeine    No Known Allergies  Current Outpatient Prescriptions  Medication Sig Dispense Refill  . albuterol (PROVENTIL HFA;VENTOLIN HFA) 108 (90 BASE) MCG/ACT inhaler Inhale 2 puffs into the lungs every 6 (six) hours as needed for wheezing or shortness of breath.     . ALPRAZolam (XANAX) 1 MG  tablet Take 1 mg by mouth 2 (two) times daily as needed for anxiety.     Marland Kitchen amLODipine (NORVASC) 10 MG tablet Take 10 mg by mouth at bedtime.     Marland Kitchen aspirin EC 81 MG tablet Take 81 mg by mouth at bedtime.    Marland Kitchen atorvastatin (LIPITOR) 80 MG tablet Take 80 mg by mouth every morning.     . B-D INS SYR ULTRAFINE 1CC/31G 31G X 5/16" 1 ML MISC USE NEW NEEDLE FOR MEAL TIME 3 TIMES A DAY AND LONG ACTING INSULIN TWICE A DAY  11  . cilostazol (PLETAL) 100 MG tablet Take 100 mg by mouth 2 (two) times daily.    . fenofibrate micronized (LOFIBRA) 200 MG capsule Take 200 mg by mouth at bedtime.     . ferrous sulfate 325 (65 FE) MG tablet Take 325 mg by mouth 3 (three) times daily.  3  . furosemide (LASIX) 40 MG tablet Take 40 mg by mouth 2 (two) times daily.     . insulin glargine (LANTUS) 100 UNIT/ML injection Inject 20-60 Units into the skin 2 (two) times daily. Sliding Scale    . levothyroxine (SYNTHROID, LEVOTHROID) 175 MCG tablet Take 175 mcg by mouth daily before breakfast.     . metoprolol (LOPRESSOR) 100 MG tablet Take 100 mg by mouth 2 (two) times daily. Takes 100 mg and 50 mg to equal 150 mg twice daily    . metoprolol (LOPRESSOR) 50 MG tablet Take 50 mg by mouth 2 (two) times daily. Takes 100 mg and a 50 mg to equal 150 mg twice daily  6  . Omega-3 Fatty Acids (FISH OIL) 1000 MG CAPS Take 1,000 mg by mouth 2 (two) times daily.    Marland Kitchen omeprazole (PRILOSEC) 40 MG capsule Take 40 mg by mouth daily.    Glory Rosebush VERIO test strip CHECK SUGAR AT HOME 4-5 TIMES A DAY  11  . oxyCODONE (OXYCONTIN) 15 mg 12 hr tablet Take 15 mg by mouth every 12 (twelve) hours.    Marland Kitchen oxyCODONE-acetaminophen (PERCOCET) 10-325 MG tablet Take 1 tablet by mouth 3 (three) times daily.     Marland Kitchen PARoxetine (PAXIL) 40 MG tablet Take 40 mg by mouth at bedtime.     . promethazine (PHENERGAN) 25 MG tablet Take 25 mg by mouth daily as needed for nausea or vomiting.   6  . Vitamin D, Ergocalciferol, (DRISDOL) 50000 UNITS CAPS capsule Take 50,000  Units by mouth once a week. mondays  1   No current facility-administered medications for this visit.     ROS:   General:  No weight loss, Fever, chills  HEENT: No recent headaches, no nasal bleeding, no visual changes, no sore throat  Neurologic: No dizziness, blackouts, seizures. No recent symptoms of stroke or mini- stroke. No recent episodes of slurred speech, or temporary blindness.  Cardiac: No recent episodes of chest pain/pressure, no shortness of breath at rest.  + shortness of breath with exertion.  Denies history of atrial fibrillation or irregular heartbeat  Vascular: No history of rest pain in feet.  No history of claudication.  + history of non-healing ulcer, No history of DVT   Pulmonary: No home oxygen, no productive cough, no hemoptysis,  No asthma or wheezing  Musculoskeletal:  [X]  Arthritis, [ ]  Low back pain,  [X]  Joint pain  Hematologic:No history of hypercoagulable state.  No history of easy bleeding.  No history of anemia  Gastrointestinal: No hematochezia or melena,  No gastroesophageal reflux, no trouble swallowing  Urinary: [ ]  chronic Kidney disease, [ ]  on HD - [ ]  MWF or [ ]  TTHS, [ ]  Burning with urination, [ ]  Frequent urination, [ ]  Difficulty urinating;   Skin: No rashes  Psychological: No history of anxiety,  No history of depression   Physical Examination  Vitals:   02/02/17 1132 02/02/17 1134  BP: (!) 147/59 (!) 153/65  Pulse: 61   Resp: 20   Temp: 97.9 F (36.6 C)   TempSrc: Oral   SpO2: 94%   Weight: 163 lb (73.9 kg)   Height: 5\' 3"  (1.6 m)     Body mass index is 28.87 kg/m.  General:  Alert and oriented, no acute distress HEENT: Normal Neck: No bruit or JVD Pulmonary: Clear to auscultation bilaterally Cardiac: Regular Rate and Rhythm without murmur Abdomen: Soft, non-tender, non-distended, no mass Skin: No rash, 1 x 1 cm opening apex of left groin incision no significant drainage BP granulation tissue at the base, 6 mm 2  mm depth ulcer posterior heel left side similar ulceration right side Extremity Pulses:  2+ radial, brachial, femoral, absent dorsalis pedis, posterior tibial pulses bilaterally Musculoskeletal: No deformity diffuse edema left lower extremity with healing incisions except as mentioned above  Neurologic: Upper and lower extremity motor 5/5 and symmetric  DATA:  I reviewed the patient's recent arteriogram which shows a SFA occlusion with reconstitution of the above-knee popliteal artery but this is diffusely diseased. The below-knee popliteal artery is a much better quality vessel with three-vessel runoff.  Patient most recent ABIs shows 0.4 on the right 0.9 on the left and a patent bypass in June of this year  ASSESSMENT:  Patient with nonhealing wound right foot. She needs a right femoral to below-knee popliteal bypass to assist with wound healing. She will need a repeat graft duplex of her left lower extremity in September of this year. We will repeat her ABIs at that point as well.   PLAN:  Right femoral to below-knee popliteal bypass most likely with PTFE. This is scheduled for 02/13/2017. Risks benefits possible, patient and procedure details were discussed patient today including but not limited to bleeding infection graft thrombosis limb loss. I also discussed with her that the patency of a PTFE graft is somewhat inferior to a vein graft. I quoted her a 50% 5 year patency rate for a PTFE graft compared to a 75% 5 year patency graft for a vein graft. She understands and agrees to proceed.  Ruta Hinds, MD [facility] Office: [phone] Pager: [phone]

## 2017-02-13 NOTE — Progress Notes (Signed)
CRITICAL VALUE ALERT  Critical Value:  hgb 6.5  Date & Time Notied:  02/13/2017   Provider Notified: PA Leontine Locket  Orders Received/Actions taken: yes

## 2017-02-13 NOTE — Progress Notes (Addendum)
Inpatient Diabetes Program Recommendations  AACE/ADA: New Consensus Statement on Inpatient Glycemic Control (2015)  Target Ranges:  Prepandial:   less than 140 mg/dL      Peak postprandial:   less than 180 mg/dL (1-2 hours)      Critically ill patients:  140 - 180 mg/dL   Lab Results  Component Value Date   GLUCAP 229 (H) 02/13/2017   HGBA1C 7.3 (H) 02/09/2017    Review of Glycemic Control:  Results for [name], [name] (MRN [id number]) as of 02/13/2017 15:39  Ref. Range 02/13/2017 06:11 02/13/2017 12:40 02/13/2017 14:47  Glucose-Capillary Latest Ref Range: 65 - 99 mg/dL 154 (H) 207 (H) 229 (H)   Diabetes history: Type 2 diabetes Outpatient Diabetes medications: Basaglar 55-60 units bid Current orders for Inpatient glycemic control:  Novolog moderate tid with meals  Inpatient Diabetes Program Recommendations:   Please consider adding Lantus 25 units bid while patient is in the hospital.  Paged, Mercedes Locket, PA and orders received.   Thanks, Mercedes Perl, RN, BC-ADM Inpatient Diabetes Coordinator Pager [phone] (8a-5p)

## 2017-02-13 NOTE — Progress Notes (Signed)
Pt received from PACU. R groin site intact, level 0, no hematoma, Prevena vac in place. L groin level zero, no hematoma, dressing clean, dry, intact. R thigh incision with skin glue clean, dry, intact. Pt VSS. HR 54. BP 110/54. O2 100% on 2L nasal cannula. RR 16. CBG 229. Pt tolerates sips of water without complaint. Pt rates pain 5/10.   Fritz Pickerel, RN

## 2017-02-13 NOTE — Progress Notes (Signed)
  Day of Surgery Note    Subjective:  No complaints  Vitals:   02/13/17 1243 02/13/17 1248  BP: (!) 83/54 (!) 108/47  Pulse: 61 (!) 59  Resp: 13 16  Temp:      Incisions:   Right groin with pravena vac in place with good seal.  BK incision is clean and dry without hematoma Extremities:  +palpable right DP/PT pulse Cardiac:  regular Lungs:  Non labored    Assessment/Plan:  This is a 61 y.o. female who is s/p Right femoral to below knee popliteal bypass with non-reversed ipsilateral great saphenous vein, right common femoral endarterectomy with profundaplasty (dacron patch), vein patch angioplasty of right below knee popliteal artery, placement right groin provena VAC  -pt doing well in recovery room with palpable right DP/PT pulse -blood pressure was soft in the 70's-she responded well to albumin.  If BP drifts down again, will check hgb.  -to 4 east when bed available   Leontine Locket, PA-C 02/13/2017 1:35 PM [phone]

## 2017-02-13 NOTE — Transfer of Care (Signed)
Immediate Anesthesia Transfer of Care Note  Patient: [name]  Procedure(s) Performed: Procedure(s): BYPASS GRAFT RIGHT FEMORAL-BELOW KNEE POPLITEAL ARTERY (Right) ENDARTERECTOMY RIGHT FEMORAL ARTERY WITH PROFUNDAPLASTY (Right) VEIN PATCH ANGIOPLASTY RIGHT POPLITEAL ARTERY AND HEMASHIELD PATCH ANGIOPLASTY OF RIGHT FEMORAL ARTERY (Right) APPLICATION OF INCISIONAL WOUND VAC RIGHT GROIN (Right)  Patient Location: PACU  Anesthesia Type:General  Level of Consciousness: awake, alert , oriented and patient cooperative  Airway & Oxygen Therapy: Patient Spontanous Breathing and Patient connected to nasal cannula oxygen  Post-op Assessment: Report given to RN, Post -op Vital signs reviewed and stable and Patient moving all extremities X 4  Post vital signs: Reviewed  Last Vitals:  Vitals:   02/13/17 0613  BP: 131/71  Pulse: 91  Resp: 20  Temp: 36.9 C    Last Pain:  Vitals:   02/13/17 0613  TempSrc: Oral      Patients Stated Pain Goal: 4 (41/42/39 5320)  Complications: Pt alert and oriented, states she feels fine, BP low, DR Ola Spurr notified, orders to give an albumin 5% IV

## 2017-02-13 NOTE — Op Note (Addendum)
Procedure: Right femoral to below knee popliteal bypass with non-reversed ipsilateral great saphenous vein, right common femoral endarterectomy with profundaplasty (dacron patch), vein patch angioplasty of right below knee popliteal artery, placement right groin provena VAC  Preoperative diagnosis: non healing wound right foot  Postoperative diagnosis: Same  Anesthesia: General  Asst.: [name], MD,  Leontine Locket, PA-C  Operative findings: 6 mm Propaten for bypass.  Vein patch as cuff in popliteal for distal.  Coronary marker placed on proximal PTFE graft  Operative details: After obtaining informed consent, the patient was taken to the operating room. The patient was placed in supine position on the operating room table. After induction of general anesthesia and endotracheal intubation, a Foley catheter was placed. Next, the patient's entire right lower extremity was prepped and draped in the usual sterile fashion. A longitudinal incision was then made in the right groin and carried down through the subcutaneous tissues to expose the right common femoral artery.   The common femoral artery was dissected free circumferentially. This was heavily calcified from the inguinal ligament to the femoral bifurcation.  There were areas where a pulse was palpable. The distal external iliac artery was dissected free circumferentially underneath the inguinal ligament. There was a good pulse within this.   A vessel loop was also placed around the distal external iliac artery. The profunda was dissected free circumferentially at its second branch point where the plaque in the profunda seemed to thin out.  The SFA was also dissected free circumferentially and a vessel loop placed around this.  Next the saphenofemoral junction was identified in the medial portion of the groin incision and this was harvested about 3 cm until I reached the portion that had been previously ligated from prior coronary bypass. Side  branches were ligated and divided between silk ties or clips.  The vein was ligated at the saphenofemoral junction and distally and this segment harvested to be used as a distal patch.  This was distended and opened longitudinally and set in heparin saline.    Next a longitudinal incision was made on the medial aspect of the leg below the knee.  This was deepened into the fascia at the below knee segment and the below knee popliteal space was entered.  The popliteal artery was dissected free circumferentially.  It was soft on palpation but small about 2-3 mm.  A tunnel was then created between the heads of the gastrocnemius muscle subsartorial up to the groin.   The patient was given 8000 units of heparin and an additional 4000 units of heparin during the case.  After appropriate circulation time, the distal right external iliac artery was controlled with a small Cooley clamp. The distal profunda and SFA were controlled with loops.   A longitudinal opening was made in the common femoral artery on its anterior surface.  There was a large amount of calcified plaque and an endarterectomy was made from the proximal common femoral down to the proximal profunda and into the SFA which was everted.  A good endpoint was obtained in the profunda after extending the arterotomy down to the profunda branch point. A dacron patch was then used to create a patch angioplasty using a running 6 0 prolene extending from the inguinal ligament to about 1 cm into the profunda.  At completion of the patch flow was restored to the profunda and there was good doppler flow.   The common femoral was then controlled proximally and distally with vessel loops and a  longitudinal opening was made in the patch adjacent to the femoral bifurcation.  A 6 mm Propaten PTFE was spatulated and sewn end to side to the patch using a running 5 0 Prolene.  Just prior to completion anastomosis everything was forebled backbled and thoroughly flushed.  Proximal clamp and distal clamps were removed and there was good pulsatile flow in the profunda femoris artery immediately. The SFA was chronically occluded.   The graft was then brought through the subsartorial tunnel down to the below-knee popliteal artery after marking for orientation. The below-knee popliteal artery was controlled proximally and distally with a fine bulldog clamp. A longitudinal opening was made in the distal below-knee popliteal artery in an area that was fairly free of calcification. The prior segment of vein was then sewn on using a running 6 0 prolene.  This was then opened longitudinally.  The graft was then cut to length and spatulated and sewn end of graft to side of artery using running 6-0 Prolene suture.   At completion of the anastomosis everything was forebled backbled and thoroughly flushed. The remainder of the anastomosis was completed and all clamps were removed restoring pulsatile flow to the below-knee popliteal artery. An intraoperative arteriogram was not obtained due to the patient's renal insufficiency.  The patient had biphasic Doppler flow in the posterior tibial area of the foot.  A few repair stitches were placed in the wall of the distal and proximal anastomosis.  Hemostasis was then complete after avitene placement and 100 mg protamine.  A coronary circular marker had been placed on the proximal graft and this was sutured into place with a 7 0 prolene suture.  After hemostasis was obtained, the deep layers and subcutaneous layers of the below-knee popliteal incision were closed with running 3-0 Vicryl suture. The skin was closed with a 4 0 vicryl subcuticular stitch.    The groin was inspected and found to be hemostatic. This was then closed in multiple layers of running 2 0 and 3-0 Vicryl suture and 4-0 subcuticular stitch. A provena vac was placed on the right groin due to the patient's obesity.  The patient tolerated the procedure well and there were no  complications. Instrument sponge and needle counts were correct at the end of the case. Patient was taken to the recovery room in stable condition.  Ruta Hinds, MD [facility] [place] Office: [phone] Pager: [phone]

## 2017-02-13 NOTE — Progress Notes (Signed)
Patient unable to give urine sample before surgery this morning.  Will need to get a sample when foley is placed in the OR

## 2017-02-13 NOTE — Anesthesia Postprocedure Evaluation (Signed)
Anesthesia Post Note  Patient: [name]  Procedure(s) Performed: Procedure(s) (LRB): BYPASS GRAFT RIGHT FEMORAL-BELOW KNEE POPLITEAL ARTERY (Right) ENDARTERECTOMY RIGHT FEMORAL ARTERY WITH PROFUNDAPLASTY (Right) VEIN PATCH ANGIOPLASTY RIGHT POPLITEAL ARTERY AND HEMASHIELD PATCH ANGIOPLASTY OF RIGHT FEMORAL ARTERY (Right) APPLICATION OF INCISIONAL WOUND VAC RIGHT GROIN (Right)     Patient location during evaluation: PACU Anesthesia Type: General Level of consciousness: awake and alert Pain management: pain level controlled Vital Signs Assessment: post-procedure vital signs reviewed and stable Respiratory status: spontaneous breathing, nonlabored ventilation, respiratory function stable and patient connected to nasal cannula oxygen Cardiovascular status: blood pressure returned to baseline and stable Postop Assessment: no signs of nausea or vomiting Anesthetic complications: no    Last Vitals:  Vitals:   02/13/17 1243 02/13/17 1248  BP: (!) 83/54 (!) 108/47  Pulse: 61 (!) 59  Resp: 13 16  Temp:      Last Pain:  Vitals:   02/13/17 1315  TempSrc:   PainSc: 0-No pain                 Tiajuana Amass

## 2017-02-14 ENCOUNTER — Encounter (HOSPITAL_COMMUNITY): Payer: Self-pay | Admitting: Vascular Surgery

## 2017-02-14 ENCOUNTER — Telehealth: Payer: Self-pay | Admitting: Vascular Surgery

## 2017-02-14 LAB — BASIC METABOLIC PANEL
Anion gap: 8 (ref 5–15)
BUN: 35 mg/dL — AB (ref 6–20)
CHLORIDE: 101 mmol/L (ref 101–111)
CO2: 23 mmol/L (ref 22–32)
Calcium: 7.2 mg/dL — ABNORMAL LOW (ref 8.9–10.3)
Creatinine, Ser: 2.01 mg/dL — ABNORMAL HIGH (ref 0.44–1.00)
GFR calc Af Amer: 29 mL/min — ABNORMAL LOW (ref >60)
GFR calc non Af Amer: 25 mL/min — ABNORMAL LOW (ref >60)
GLUCOSE: 305 mg/dL — AB (ref 65–99)
POTASSIUM: 4.9 mmol/L (ref 3.5–5.1)
SODIUM: 132 mmol/L — AB (ref 135–145)

## 2017-02-14 LAB — TYPE AND SCREEN
ABO/RH(D): A POS
Antibody Screen: NEGATIVE
UNIT DIVISION: 0
Unit division: 0

## 2017-02-14 LAB — CBC
HCT: 24.5 % — ABNORMAL LOW (ref 36.0–46.0)
HEMOGLOBIN: 8.2 g/dL — AB (ref 12.0–15.0)
MCH: 29 pg (ref 26.0–34.0)
MCHC: 33.5 g/dL (ref 30.0–36.0)
MCV: 86.6 fL (ref 78.0–100.0)
Platelets: 129 10*3/uL — ABNORMAL LOW (ref 150–400)
RBC: 2.83 MIL/uL — AB (ref 3.87–5.11)
RDW: 16.1 % — ABNORMAL HIGH (ref 11.5–15.5)
WBC: 10.5 10*3/uL (ref 4.0–10.5)

## 2017-02-14 LAB — GLUCOSE, CAPILLARY
GLUCOSE-CAPILLARY: 304 mg/dL — AB (ref 65–99)
Glucose-Capillary: 297 mg/dL — ABNORMAL HIGH (ref 65–99)
Glucose-Capillary: 247 mg/dL — ABNORMAL HIGH (ref 65–99)

## 2017-02-14 LAB — BPAM RBC
BLOOD PRODUCT EXPIRATION DATE: 201808282359
Blood Product Expiration Date: 201808282359
ISSUE DATE / TIME: 201808062302
ISSUE DATE / TIME: 201808062030
Unit Type and Rh: 6200
Unit Type and Rh: 6200

## 2017-02-14 LAB — URINE CULTURE: CULTURE: NO GROWTH

## 2017-02-14 MED ORDER — OXYCODONE-ACETAMINOPHEN 10-325 MG PO TABS: 1.0000 | ORAL_TABLET | Freq: Three times a day (TID) | ORAL | 0 refills | Status: DC | PRN

## 2017-02-14 NOTE — Care Management Note (Signed)
Case Management Note Marvetta Gibbons RN, BSN Unit 4E-Case Manager [phone]  Patient Details  Name: [name] MRN: [id number] Date of Birth: January 16, 1956  Subjective/Objective:   Pt admitted s/p fem pop bypass graft                 Action/Plan: PTA pt lived at home- PT/OT evals pending- CM to follow for recommendations and d/c needs  Expected Discharge Date:  02/14/17               Expected Discharge Plan:  [place]  In-House Referral:  NA  Discharge planning Services  CM Consult  Post Acute Care Choice:  Home Health Choice offered to:  Patient  DME Arranged:  N/A DME Agency:  NA  HH Arranged:  PT HH Agency:  Man  Status of Service:  Completed, signed off  If discussed at [place] of Stay Meetings, dates discussed:    Discharge Disposition: home/home health   Additional Comments:  02/14/17- 1430- Nesanel Aguila RN, CM- pt for d/c home today- PT recommendation for HHPT- order has been placed- spoke with pt at bedside- choice offered for [facility] services- per pt she has been with [facility] in past and would like to use them again- referral called to [place] with [facility]- referral accepted and they will call pt to f/u on home visits. Per pt has needed DME- no further CM needs noted.   Dawayne Patricia, RN 02/14/2017, 2:36 PM

## 2017-02-14 NOTE — Evaluation (Signed)
Physical Therapy Evaluation Patient Details Name: [name] MRN: [id number] DOB: May 16, 1956 Today's Date: 02/14/2017   History of Present Illness  pt is a 61 y/o female with pmh significant for CAD, PVD, dm2, HTN chronic renal insufficiency and recent L fem-pop BPG, admitted  for R LE BPGing, also for non-healing wound.  Clinical Impression  Pt close to baseline functioning and should be safe at home with sister's assist for a few days. There are no further acute PT needs, pt may still benefit from short term HHPT due to significant deconditioning.  Will sign off at this time.     Follow Up Recommendations Home health PT;Supervision - Intermittent    Equipment Recommendations  None recommended by PT    Recommendations for Other Services       Precautions / Restrictions Precautions Precautions: Fall Restrictions Weight Bearing Restrictions: Yes RLE Weight Bearing: Weight bearing as tolerated      Mobility  Bed Mobility Overal bed mobility: Modified Independent                Transfers Overall transfer level: Modified independent Equipment used: Rolling walker (2 wheeled)             General transfer comment: cues for safest technique  Ambulation/Gait Ambulation/Gait assistance: Supervision Ambulation Distance (Feet): 60 Feet Assistive device: Rolling walker (2 wheeled) Gait Pattern/deviations: Step-through pattern;Step-to pattern   Gait velocity interpretation: Below normal speed for age/gender General Gait Details: antalgic, but stable gait on R LE  Stairs Stairs:  (pt deferred stairs stating she will do stairs like before)          Wheelchair Mobility    Modified Rankin (Stroke Patients Only)       Balance Overall balance assessment: No apparent balance deficits (not formally assessed)                                           Pertinent Vitals/Pain Pain Assessment: 0-10 Pain Score: 6  Pain Location: R LE  incision Pain Descriptors / Indicators: Burning;Sharp;Grimacing Pain Intervention(s): Monitored during session    Home Living Family/patient expects to be discharged to:: Private residence Living Arrangements: Alone Available Help at Discharge: Family;Other (Comment) (sister coming to stay as long as pt needs) Type of Home: Mobile home Home Access: Stairs to enter Entrance Stairs-Rails: Right Entrance Stairs-Number of Steps: 3 Home Layout: One level Home Equipment: [place] - 4 wheels;Cane - single point;Grab bars - tub/shower Additional Comments: Inconsistent report of home set up compared to PT eval    Prior Function Level of Independence: Independent with assistive device(s)         Comments: has not had to use AD after L LE healed.     Hand Dominance        Extremity/Trunk Assessment   Upper Extremity Assessment Upper Extremity Assessment: Overall WFL for tasks assessed    Lower Extremity Assessment Lower Extremity Assessment: Overall WFL for tasks assessed       Communication   Communication: No difficulties  Cognition Arousal/Alertness: Awake/alert Behavior During Therapy: WFL for tasks assessed/performed;Impulsive Overall Cognitive Status: Within Functional Limits for tasks assessed                                        General Comments  Exercises     Assessment/Plan    PT Assessment Patient needs continued PT services  PT Problem List Decreased activity tolerance;Decreased mobility;Pain       PT Treatment Interventions      PT Goals (Current goals can be found in the Care Plan section)  Acute Rehab PT Goals Patient Stated Goal: go home today PT Goal Formulation: All assessment and education complete, DC therapy Potential to Achieve Goals: Good    Frequency     Barriers to discharge        Co-evaluation               AM-PAC PT "6 Clicks" Daily Activity  Outcome Measure Difficulty turning over in bed  (including adjusting bedclothes, sheets and blankets)?: None Difficulty moving from lying on back to sitting on the side of the bed? : None Difficulty sitting down on and standing up from a chair with arms (e.g., wheelchair, bedside commode, etc,.)?: None Help needed moving to and from a bed to chair (including a wheelchair)?: None Help needed walking in hospital room?: None Help needed climbing 3-5 steps with a railing? : A Little 6 Click Score: 23    End of Session   Activity Tolerance: Patient tolerated treatment well;Patient limited by pain Patient left: in bed;with call bell/phone within reach Nurse Communication: Mobility status PT Visit Diagnosis: Other abnormalities of gait and mobility (R26.89);Pain Pain - Right/Left: Right Pain - part of body: Leg    Time: 1222-1244 PT Time Calculation (min) (ACUTE ONLY): 22 min   Charges:   PT Evaluation $PT Eval Low Complexity: 1 Low     PT G Codes:        2017/03/05  Donnella Sham, PT [phone]  (pager)  Tessie Fass Sherry Blackard 03/05/2017, 1:00 PM

## 2017-02-14 NOTE — Telephone Encounter (Signed)
Sched appt 03/30/17 at 3:30. Lm on hm#.

## 2017-02-14 NOTE — Progress Notes (Addendum)
  Progress Note    02/14/2017 7:39 AM 1 Day Post-Op  Subjective:  Feels pretty good this morning  Afebrile HR 50's-70's NSR 026'V-785'Y systolic 85% [id number]  Vitals:   02/14/17 0500 02/14/17 0600  BP: (!) 128/43 (!) 119/40  Pulse:    Resp: 13 12  Temp:      Physical Exam: Cardiac:  regular Lungs:  Non labored Incisions:   Right groin with vac in place with good seal; right BK incision is clean and dry Extremities:  Easily palpable right DP/PT pulse   CBC    Component Value Date/Time   WBC 10.5 02/14/2017 0511   RBC 2.83 (L) 02/14/2017 0511   HGB 8.2 (L) 02/14/2017 0511   HGB 11.9 11/10/2011 0959   HCT 24.5 (L) 02/14/2017 0511   HCT 37.1 11/10/2011 0959   PLT 129 (L) 02/14/2017 0511   PLT 202 11/10/2011 0959   MCV 86.6 02/14/2017 0511   MCV 79 (L) 11/10/2011 0959   MCH 29.0 02/14/2017 0511   MCHC 33.5 02/14/2017 0511   RDW 16.1 (H) 02/14/2017 0511   LYMPHSABS 1.3 12/10/2013 0859   LYMPHSABS 1.3 11/10/2011 0959   MONOABS 0.5 12/10/2013 0859   EOSABS 0.1 12/10/2013 0859   EOSABS 0.2 11/10/2011 0959   BASOSABS 0.0 12/10/2013 0859   BASOSABS 0.0 11/10/2011 0959    BMET    Component Value Date/Time   NA 132 (L) 02/14/2017 0511   NA 135 11/18/2016 1219   K 4.9 02/14/2017 0511   CL 101 02/14/2017 0511   CO2 23 02/14/2017 0511   GLUCOSE 305 (H) 02/14/2017 0511   BUN 35 (H) 02/14/2017 0511   BUN 38 (H) 11/18/2016 1219   CREATININE 2.01 (H) 02/14/2017 0511   CALCIUM 7.2 (L) 02/14/2017 0511   GFRNONAA 25 (L) 02/14/2017 0511   GFRAA 29 (L) 02/14/2017 0511    INR    Component Value Date/Time   INR 1.05 02/09/2017 1025     Intake/Output Summary (Last 24 hours) at 02/14/17 0739 Last data filed at 02/14/17 0600  Gross per 24 hour  Intake             4620 ml  Output             2800 ml  Net             1820 ml     Assessment:  61 y.o. female is s/p:  Right femoral to below knee popliteal bypass with non-reversed ipsilateral great saphenous vein,  right common femoral endarterectomy with profundaplasty (dacronpatch), vein patch angioplasty of right below knee popliteal artery, placement right groin provena VAC  1 Day Post-Op  Plan: -pt doing well this am with palpable right DP/PT pulses -acute surgical blood loss anemia-transfused 2 units yesterday and hgb has improved from 6.5 to 8.2.   -creatinine up to 2 this morning-she was around 2 back in May-will continue to monitor.  Good UOP -DVT prophylaxis:  SQ heparin to start this am -mobilize/OOB; transfer to tele status   Leontine Locket, PA-C [facility] [phone] 02/14/2017 7:39 AM  Agree with above. D/c home today if ambulates ok  Ruta Hinds, MD [facility] [place] Office: [phone] Pager: [phone]

## 2017-02-14 NOTE — Care Management Note (Signed)
Case Management Note Marvetta Gibbons RN, BSN Unit 4E-Case Manager [phone]  Patient Details  Name: [name] MRN: [id number] Date of Birth: 1955/11/22  Subjective/Objective:   Pt admitted s/p fem pop bypass graft                 Action/Plan: PTA pt lived at home- PT/OT evals pending- CM to follow for recommendations and d/c needs  Expected Discharge Date:                  Expected Discharge Plan:  Home/Self Care  In-House Referral:     Discharge planning Services  CM Consult  Post Acute Care Choice:    Choice offered to:     DME Arranged:    DME Agency:     HH Arranged:    HH Agency:     Status of Service:  In process, will continue to follow  If discussed at Long Length of Stay Meetings, dates discussed:    Additional Comments:  Dawayne Patricia, RN 02/14/2017, 10:31 AM

## 2017-02-14 NOTE — Telephone Encounter (Signed)
-----   Message from Mena Goes, RN sent at 02/14/2017 12:17 PM EDT ----- Regarding: 2 weeks postop    ----- Message ----- From: Gabriel Earing, PA-C Sent: 02/14/2017  12:02 PM To: [facility] Charge Pool  S/p right fem pop bypass.  F/u with DR. Fields in 2 weeks.  Thanks

## 2017-02-14 NOTE — Discharge Summary (Signed)
Discharge Summary     [name] January 28, 1956 61 y.o. 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 female who previously underwent left femoral to below-knee popliteal bypass with a vein graft approximately 10 weeks ago. This was done for a nonhealing wound on her posterior heel. This is slowly healing. She still has some residual swelling. The groin incision is not completely healed but not really draining. She now is referred for right femoral popliteal bypass grafting again for a nonhealing wound on her right foot. Recent arteriogram by Dr. Fletcher Anon shows anatomy suitable for a right femoral below-knee popliteal bypass. She has had previous right leg vein harvest for coronary grafting. She states she has quit smoking. Other medical problems include CK D3, coronary artery disease, diabetes, hypertension, all of which are currently stable.  Hospital Course:  The patient was admitted to the hospital and taken to the operating room on 02/13/2017 and underwent: Right femoral to below knee popliteal bypass with non-reversed ipsilateral great saphenous vein, right common femoral endarterectomy with profundaplasty (dacron patch), vein patch angioplasty of right below knee popliteal artery, placement right groin provena VAC    Intraoperative findings as follows: 6 mm Propaten for bypass.  Vein patch as cuff in popliteal for distal.  Coronary marker placed on proximal PTFE graft  The pt tolerated the procedure well and was transported to the PACU in good condition.   That evening, the pt had acute surgical blood loss anemia with hgb of 6.5.  She was transfused 2 units of PRBC's.    By POD 1, her hgb had improved to 8.2.  She had a palpable right DP/PT pulse.  The Pravena wound vac was with good seal and no drainage.   She did have a bump in her creatinine to 2 this morning-she was around 2 back in May.  Dr. Oneida Alar felt comfortable with her discharging home on POD 1 after ambulating without difficulty.  She will f/u in a couple of weeks.    The remainder of the hospital course consisted of increasing mobilization and increasing intake of solids without difficulty.  CBC    Component Value Date/Time   WBC 10.5 02/14/2017 0511   RBC 2.83 (L) 02/14/2017 0511   HGB 8.2 (L) 02/14/2017 0511   HGB 11.9 11/10/2011 0959   HCT 24.5 (L) 02/14/2017 0511   HCT 37.1 11/10/2011 0959   PLT 129 (L) 02/14/2017 0511   PLT 202 11/10/2011 0959   MCV 86.6 02/14/2017 0511   MCV 79 (L) 11/10/2011 0959   MCH 29.0 02/14/2017 0511   MCHC 33.5 02/14/2017 0511   RDW 16.1 (H) 02/14/2017 0511   LYMPHSABS 1.3 12/10/2013 0859   LYMPHSABS 1.3 11/10/2011 0959   MONOABS 0.5 12/10/2013 0859   EOSABS 0.1 12/10/2013 0859   EOSABS 0.2 11/10/2011 0959   BASOSABS 0.0 12/10/2013 0859   BASOSABS 0.0 11/10/2011 0959    BMET    Component Value Date/Time   NA 132 (L) 02/14/2017 0511   NA 135 11/18/2016 1219   K 4.9 02/14/2017 0511   CL 101 02/14/2017 0511   CO2 23 02/14/2017 0511   GLUCOSE 305 (H) 02/14/2017 0511   BUN 35 (H) 02/14/2017 0511   BUN 38 (H) 11/18/2016 1219   CREATININE 2.01 (H) 02/14/2017 0511   CALCIUM 7.2 (L) 02/14/2017  [place] (L) 02/14/2017 0511   GFRAA 29 (L) 02/14/2017 0511       Discharge Diagnosis:  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 Pressure injury of skin 11/25/2016  . PAD (peripheral artery disease) ([place]) 11/23/2016  . Major depressive disorder, recurrent episode, moderate ([place]) 04/04/2016  . Disorder resulting from impaired renal function 10/27/2009  . Bilateral carotid artery stenosis 10/08/2009  . Essential hypertension 10/02/2008  . GERD 03/11/2008  . COLONIC POLYPS  03/10/2008  . Type II or unspecified type diabetes mellitus without mention of complication, not stated as uncontrolled 03/10/2008  . Hyperlipidemia 03/10/2008  . ANXIETY DEPRESSION 03/10/2008  . Coronary atherosclerosis 03/10/2008  . Peripheral vascular disease ([place]) 03/10/2008  . DIVERTICULOSIS, COLON 03/10/2008   Past Medical History:  Diagnosis Date  . Adenomatous polyp of colon   . Anemia   . Arthritis   . Asthma   . Carotid artery disease ([place])   . Carpal tunnel syndrome   . Chronic renal insufficiency    Stage 3 Kidney disease  . Coronary artery disease   . Depression   . Depression with anxiety   . Diabetes mellitus    type II  . Diverticulosis of colon (without mention of hemorrhage)   . GERD (gastroesophageal reflux disease)   . Heart murmur   . History of hiatal hernia   . Hyperlipidemia   . Hypertension   . Hypothyroidism   . Obesity   . PVD (peripheral vascular disease) ([place])   . Thyroid disease    hypo     Allergies as of 02/14/2017   No Known Allergies     Medication List    TAKE these medications   albuterol 108 (90 Base) MCG/ACT inhaler Commonly known as:  PROVENTIL HFA;VENTOLIN HFA Inhale 2 puffs into the lungs every 6 (six) hours as needed for wheezing or shortness of breath.   ALPRAZolam 1 MG tablet Commonly known as:  XANAX Take 1 mg by mouth 2 (two) times daily as needed for anxiety.   amLODipine 10 MG tablet Commonly known as:  NORVASC Take 10 mg by mouth at bedtime.   aspirin EC 81 MG tablet Take 81 mg by mouth at bedtime.   atorvastatin 80 MG tablet Commonly known as:  LIPITOR Take 80 mg by mouth every morning.   B-D INS SYR ULTRAFINE 1CC/31G 31G X 5/16" 1 ML Misc Generic drug:  Insulin Syringe-Needle U-100 USE NEW NEEDLE FOR MEAL TIME 3 TIMES A DAY AND LONG ACTING INSULIN TWICE A DAY   BASAGLAR KWIKPEN 100 UNIT/ML Sopn Inject 20-60 Units into the skin 2 (two) times daily before a meal. Sliding Scale   cilostazol 100 MG  tablet Commonly known as:  PLETAL Take 100 mg by mouth 2 (two) times daily.   ciprofloxacin 500 MG tablet Commonly known as:  CIPRO Take 1 tablet (500 mg total) by mouth 2 (two) times daily.   fenofibrate micronized 200 MG capsule Commonly known as:  LOFIBRA Take 200 mg by mouth at bedtime.   ferrous sulfate 325 (65 FE) MG tablet Take 325 mg by mouth 3 (three) times daily.   Fish Oil 1000 MG Caps Take 1,000 mg by mouth 2 (two) times daily.   furosemide 40 MG tablet Commonly known as:  LASIX Take 40 mg by mouth 2 (two) times daily.   levothyroxine 175 MCG tablet Commonly known as:  SYNTHROID, LEVOTHROID Take 175 mcg by mouth daily before breakfast.   LYRICA 75 MG capsule Generic drug:  pregabalin Take 75 mg by mouth at bedtime as needed for pain.   metoprolol tartrate 100 MG tablet Commonly known as:  LOPRESSOR Take 100 mg by mouth 2 (two) times daily. Takes (1) 100 mg tab and (1) 50 mg tab together to equal 150 mg   metoprolol tartrate 50 MG tablet Commonly known as:  LOPRESSOR Take 50 mg by mouth 2 (two) times daily. Takes 100 mg and a 50 mg to equal 150 mg twice daily   NARCAN 4 MG/0.1ML Liqd nasal spray kit Generic drug:  naloxone Place 1 spray into both nostrils See admin instructions. As needed in case of an overdose   omeprazole 40 MG capsule Commonly known as:  PRILOSEC Take 40 mg by mouth daily.   ONETOUCH VERIO test strip Generic drug:  glucose blood CHECK SUGAR AT HOME 4-5 TIMES A DAY   oxyCODONE 15 mg 12 hr tablet Commonly known as:  OXYCONTIN Take 15 mg by mouth every 12 (twelve) hours.   oxyCODONE-acetaminophen 10-325 MG tablet Commonly known as:  PERCOCET Take 1 tablet by mouth 3 (three) times daily as needed for pain.   PARoxetine 40 MG tablet Commonly known as:  PAXIL Take 40 mg by mouth at bedtime.   promethazine 25 MG tablet Commonly known as:  PHENERGAN Take 25 mg by mouth daily as needed for nausea or vomiting.   Vitamin D  (Ergocalciferol) 50000 units Caps capsule Commonly known as:  DRISDOL Take 50,000 Units by mouth once a week. mondays       Discharge Instructions:   [facility]  Discharge instructions  Lower Extremity Bypass Surgery  Please refer to the following instruction for your post-procedure care. Your surgeon or physician assistant will discuss any changes with you.  Activity  You are encouraged to walk as much as you can. You can slowly return to normal activities during the month after your surgery. Avoid strenuous activity and heavy lifting until your doctor tells you it's OK. Avoid activities such as vacuuming or swinging a golf club. Do not drive until your doctor give the OK and you are no longer taking prescription pain medications. It is also normal to have difficulty with sleep habits, eating and bowel movement after surgery. These will go away with time.  Bathing/Showering  You may shower after you go home. Do not soak in a bathtub, hot tub, or swim until the incision heals completely.  Incision Care  Clean your incision with mild soap and water. Shower every day. Pat the area dry with a clean towel. You do not need a bandage unless otherwise instructed. Do not apply any ointments or creams to your incision. If you have open wounds you will be instructed how to care for them or a visiting nurse may be arranged for you. If you have staples or sutures along your incision they will be removed at your post-op appointment. You may have skin glue on your incision. Do not peel it off. It will come off on its own in about one week.  Leave the Pravena wound vac in place for one week then remove.  Once removed, wash the groin wound with soap and water daily and pat dry. (No tub bath-only shower)  Then put a dry gauze or washcloth in the groin to keep this area dry to help prevent wound infection.  Do this daily  and as needed.  Do not use Vaseline or neosporin on  your incisions.  Only use soap and water on your incisions and then protect and keep dry.  Diet  Resume your normal diet. There are no special food restrictions following this procedure. A low fat/ low cholesterol diet is recommended for all patients with vascular disease. In order to heal from your surgery, it is CRITICAL to get adequate nutrition. Your body requires vitamins, minerals, and protein. Vegetables are the best source of vitamins and minerals. Vegetables also provide the perfect balance of protein. Processed food has little nutritional value, so try to avoid this.  Medications  Resume taking all your medications unless your doctor or nurse practitioner tells you not to. If your incision is causing pain, you may take over-the-counter pain relievers such as acetaminophen (Tylenol). If you were prescribed a stronger pain medication, please aware these medication can cause nausea and constipation. Prevent nausea by taking the medication with a snack or meal. Avoid constipation by drinking plenty of fluids and eating foods with high amount of fiber, such as fruits, vegetables, and grains. Take Colase 100 mg (an over-the-counter stool softener) twice a day as needed for constipation. Do not take Tylenol if you are taking prescription pain medications.  Follow Up  Our office will schedule a follow up appointment 2-3 weeks following discharge.  Please call us immediately for any of the following conditions  .Severe or worsening pain in your legs or feet while at rest or while walking .Increase pain, redness, warmth, or drainage (pus) from your incision site(s) Fever of 101 degree or higher The swelling in your leg with the bypass suddenly worsens and becomes more painful than when you were in the hospital If you have been instructed to feel your graft pulse then you should do so every day. If you can no longer feel this pulse, call the office immediately. Not all patients are given this  instruction.  Leg swelling is common after leg bypass surgery.  The swelling should improve over a few months following surgery. To improve the swelling, you may elevate your legs above the level of your heart while you are sitting or resting. Your surgeon or physician assistant may ask you to apply an ACE wrap or wear compression (TED) stockings to help to reduce swelling.  Reduce your risk of vascular disease  Stop smoking. If you would like help call [facility] at 1-800-QUIT-NOW [phone]) or [place] at [phone].  Manage your cholesterol Maintain a desired weight Control your diabetes weight Control your diabetes Keep your blood pressure down  If you have any questions, please call the office at [phone]   Prescriptions given: 1.  Roxicet #30 No Refill  Disposition: home  Patient's condition: is Good  Follow up: 1. Dr. Oneida Alar in 2 weeks   Leontine Locket, PA-C [facility] [phone] 02/14/2017  12:10 PM  - For VQI Registry use ---   Post-op:  Wound infection: No  Graft infection: No  Transfusion: Yes    If yes, 2 units given New Arrhythmia: No Ipsilateral amputation: No, _0  Minor, _1  BKA, _2  AKA Discharge patency: [x ] Primary, _3  Primary assisted, _4  Secondary, _5  Occluded Patency judged by: _6  Dopper only, _7  Palpable graft pulse, _8  Palpable distal pulse, _9  ABI inc. > 0.15, _10  Duplex Discharge ABI: R not done, L not done D/C Ambulatory Status: Ambulatory  Complications: MI: No, <[id number]>_8<\/[id number]>_92  Troponin  only, _0  EKG or Clinical CHF: No Resp failure:No, _1  Pneumonia, _2  Ventilator Chg in renal function: Yes, [x ] Inc. Cr > 0.5, _3  Temp. Dialysis,  _4  Permanent dialysis Stroke: No, _5  Minor, _6  Major Return to OR: No  Reason for return to OR: _7  Bleeding, _8  Infection, _9  Thrombosis, _10  Revision  Discharge medications: Statin use:  yes ASA use:  yes Plavix use:  no Beta blocker use: yes CCB use:  No ACEI  use:   no ARB use:  no Coumadin use: no

## 2017-02-14 NOTE — Discharge Instructions (Signed)
[facility]  Discharge instructions  Lower Extremity Bypass Surgery  Please refer to the following instruction for your post-procedure care. Your surgeon or physician assistant will discuss any changes with you.  Activity  You are encouraged to walk as much as you can. You can slowly return to normal activities during the month after your surgery. Avoid strenuous activity and heavy lifting until your doctor tells you it's OK. Avoid activities such as vacuuming or swinging a golf club. Do not drive until your doctor give the OK and you are no longer taking prescription pain medications. It is also normal to have difficulty with sleep habits, eating and bowel movement after surgery. These will go away with time.  Bathing/Showering  You may shower after you go home. Do not soak in a bathtub, hot tub, or swim until the incision heals completely.  Incision Care  Clean your incision with mild soap and water. Shower every day. Pat the area dry with a clean towel. You do not need a bandage unless otherwise instructed. Do not apply any ointments or creams to your incision. If you have open wounds you will be instructed how to care for them or a visiting nurse may be arranged for you. If you have staples or sutures along your incision they will be removed at your post-op appointment. You may have skin glue on your incision. Do not peel it off. It will come off on its own in about one week.  Leave the Pravena wound vac in place for one week then remove.  Once removed, wash the groin wound with soap and water daily and pat dry. (No tub bath-only shower)  Then put a dry gauze or washcloth in the groin to keep this area dry to help prevent wound infection.  Do this daily and as needed.  Do not use Vaseline or neosporin on your incisions.  Only use soap and water on your incisions and then protect and keep dry.  Diet  Resume your normal diet. There are no special food  restrictions following this procedure. A low fat/ low cholesterol diet is recommended for all patients with vascular disease. In order to heal from your surgery, it is CRITICAL to get adequate nutrition. Your body requires vitamins, minerals, and protein. Vegetables are the best source of vitamins and minerals. Vegetables also provide the perfect balance of protein. Processed food has little nutritional value, so try to avoid this.  Medications  Resume taking all your medications unless your doctor or nurse practitioner tells you not to. If your incision is causing pain, you may take over-the-counter pain relievers such as acetaminophen (Tylenol). If you were prescribed a stronger pain medication, please aware these medication can cause nausea and constipation. Prevent nausea by taking the medication with a snack or meal. Avoid constipation by drinking plenty of fluids and eating foods with high amount of fiber, such as fruits, vegetables, and grains. Take Colase 100 mg (an over-the-counter stool softener) twice a day as needed for constipation. Do not take Tylenol if you are taking prescription pain medications.  Follow Up  Our office will schedule a follow up appointment 2-3 weeks following discharge.  Please call us immediately for any of the following conditions  Severe or worsening pain in your legs or feet while at rest or while walking Increase pain, redness, warmth, or drainage (pus) from your incision site(s) Fever of 101 degree or higher The swelling in your leg with the bypass suddenly  worsens and becomes more painful than when you were in the hospital If you have been instructed to feel your graft pulse then you should do so every day. If you can no longer feel this pulse, call the office immediately. Not all patients are given this instruction.  Leg swelling is common after leg bypass surgery.  The swelling should improve over a few months following surgery. To improve the swelling,  you may elevate your legs above the level of your heart while you are sitting or resting. Your surgeon or physician assistant may ask you to apply an ACE wrap or wear compression (TED) stockings to help to reduce swelling.  Reduce your risk of vascular disease  Stop smoking. If you would like help call [facility] at 1-800-QUIT-NOW [phone]) or [place] at [phone].  Manage your cholesterol Maintain a desired weight Control your diabetes weight Control your diabetes Keep your blood pressure down  If you have any questions, please call the office at [phone]

## 2017-02-21 ENCOUNTER — Ambulatory Visit: Payer: Medicare Other | Admitting: Cardiovascular Disease

## 2017-03-24 ENCOUNTER — Other Ambulatory Visit (HOSPITAL_COMMUNITY): Payer: Self-pay | Admitting: *Deleted

## 2017-03-27 ENCOUNTER — Ambulatory Visit (HOSPITAL_COMMUNITY)
Admission: RE | Admit: 2017-03-27 | Discharge: 2017-03-27 | Disposition: A | Discharge status: Home / Self Care | Payer: Medicare Other | Source: Ambulatory Visit | Attending: Nephrology | Admitting: Nephrology

## 2017-03-27 DIAGNOSIS — N189 Chronic kidney disease, unspecified: Secondary | ICD-10-CM | POA: Insufficient documentation

## 2017-03-27 DIAGNOSIS — D631 Anemia in chronic kidney disease: Secondary | ICD-10-CM | POA: Diagnosis present

## 2017-03-27 MED ORDER — SODIUM CHLORIDE 0.9 % IV SOLN
510.0000 mg | INTRAVENOUS | Status: DC
  Administered 2017-03-27: 11:00:00 510 mg via INTRAVENOUS
  Filled 2017-03-27: qty 17

## 2017-03-27 NOTE — Discharge Instructions (Signed)

## 2017-03-30 ENCOUNTER — Encounter: Payer: Self-pay | Admitting: Vascular Surgery

## 2017-03-30 ENCOUNTER — Ambulatory Visit (INDEPENDENT_AMBULATORY_CARE_PROVIDER_SITE_OTHER): Payer: Self-pay | Admitting: Vascular Surgery

## 2017-03-30 VITALS — BP 150/54 | HR 75 | Temp 98.4°F | Resp 18 | Ht 60.0 in | Wt 175.0 lb

## 2017-03-30 DIAGNOSIS — I739 Peripheral vascular disease, unspecified: Secondary | ICD-10-CM

## 2017-03-30 MED ORDER — OXYCODONE-ACETAMINOPHEN 5-325 MG PO TABS: 1.0000 | ORAL_TABLET | Freq: Three times a day (TID) | ORAL | 0 refills | Status: DC | PRN

## 2017-03-30 NOTE — Progress Notes (Signed)
Patient is a 61 year old female who returns for follow-up today. She underwent left external iliac common femoral profunda endarterectomy with profundoplasty as well as left femoral to below-knee popliteal artery bypass with vein on 11/23/2016. She still is having some drainage from her left groin. She has no claudication symptoms in the left leg.  Most recently she underwent a right femoral to below-knee popliteal bypass with propaten with a distal vein cuff due to her vein previously been harvested for coronary bypass grafting. She has no claudication symptoms on the right side. She also has a small amount of drainage from the right groin.   She still has nonhealing wounds on the heels of both feet she is placing dry dressings on these. He is trying to offload them as much as possible.  Physical exam:  Vitals:   03/30/17 1453  BP: (!) 150/54  Pulse: 75  Resp: 18  Temp: 98.4 F (36.9 C)  SpO2: 96%  Weight: 175 lb (79.4 kg)  Height: 5' (1.524 m)    She still has lower extremity edema in both legs primarily pretibial with some in the feet. Feet are warm and pink bilaterally. All incisions healing well except for the groins on both sides. Each groin incision has about a 1 cm opening a few millimeters in depth with a small amount of serous drainage. I slightly opened the left 1 today to provide better drainage.  Each heel ulcer is about 2 cm in diameter less than 1 mm in depth with some granulation tissue at the base.  Assessment: Doing well status post bilateral femoral-popliteal bypass grafts. Groins are slowly healing.  Plan: The patient will follow-up with a graft duplex of both legs in 3 months time. She will continue to place a small amount antibiotic ointment on each groin with a dry dressing over this until these are completely healed. He was again emphasized to keep any pressure off of her heel ulcers. She was given a prescription for an additional 14 Percocet 5/325. She should not  need any further narcotic pain medication after this.  Ruta Hinds, MD [facility] [place] Office: [phone] Pager: [phone]

## 2017-03-31 ENCOUNTER — Other Ambulatory Visit: Payer: Self-pay | Admitting: Physician Assistant

## 2017-03-31 ENCOUNTER — Ambulatory Visit
Admission: RE | Admit: 2017-03-31 | Discharge: 2017-03-31 | Disposition: A | Discharge status: Home / Self Care | Payer: Medicare Other | Source: Ambulatory Visit | Attending: Physician Assistant | Admitting: Physician Assistant

## 2017-03-31 DIAGNOSIS — M47816 Spondylosis without myelopathy or radiculopathy, lumbar region: Secondary | ICD-10-CM

## 2017-03-31 DIAGNOSIS — M5136 Other intervertebral disc degeneration, lumbar region: Secondary | ICD-10-CM

## 2017-03-31 DIAGNOSIS — M545 Low back pain: Secondary | ICD-10-CM

## 2017-04-03 ENCOUNTER — Encounter (HOSPITAL_COMMUNITY)
Admission: RE | Admit: 2017-04-03 | Discharge: 2017-04-03 | Disposition: A | Discharge status: Home / Self Care | Payer: Medicare Other | Source: Ambulatory Visit | Attending: Nephrology | Admitting: Nephrology

## 2017-04-03 DIAGNOSIS — D631 Anemia in chronic kidney disease: Secondary | ICD-10-CM | POA: Diagnosis present

## 2017-04-03 DIAGNOSIS — N189 Chronic kidney disease, unspecified: Secondary | ICD-10-CM | POA: Insufficient documentation

## 2017-04-03 MED ORDER — SODIUM CHLORIDE 0.9 % IV SOLN
510.0000 mg | INTRAVENOUS | Status: AC
  Administered 2017-04-03: 11:00:00 510 mg via INTRAVENOUS
  Filled 2017-04-03: qty 17

## 2017-04-03 NOTE — Addendum Note (Signed)
Addended by: Lianne Cure A on: 04/03/2017 11:04 AM   Modules accepted: Orders

## 2017-04-06 ENCOUNTER — Other Ambulatory Visit (HOSPITAL_COMMUNITY): Payer: Self-pay

## 2017-04-06 ENCOUNTER — Encounter (HOSPITAL_COMMUNITY): Payer: Self-pay

## 2017-04-06 ENCOUNTER — Ambulatory Visit: Payer: Self-pay | Admitting: Vascular Surgery

## 2017-04-18 ENCOUNTER — Ambulatory Visit: Payer: Medicare Other | Admitting: Cardiovascular Disease

## 2017-06-05 ENCOUNTER — Ambulatory Visit: Payer: Medicare Other | Attending: Physician Assistant | Admitting: Physical Therapy

## 2017-06-05 ENCOUNTER — Encounter: Payer: Self-pay | Admitting: Physical Therapy

## 2017-06-05 DIAGNOSIS — M62830 Muscle spasm of back: Secondary | ICD-10-CM | POA: Insufficient documentation

## 2017-06-05 DIAGNOSIS — G8929 Other chronic pain: Secondary | ICD-10-CM | POA: Insufficient documentation

## 2017-06-05 DIAGNOSIS — R2689 Other abnormalities of gait and mobility: Secondary | ICD-10-CM | POA: Diagnosis present

## 2017-06-05 DIAGNOSIS — M6281 Muscle weakness (generalized): Secondary | ICD-10-CM | POA: Diagnosis present

## 2017-06-05 DIAGNOSIS — M545 Low back pain: Secondary | ICD-10-CM | POA: Diagnosis not present

## 2017-06-05 NOTE — Therapy (Signed)
[place], [place], [address] Phone: [phone]   Fax:  [phone]  Physical Therapy Evaluation  Patient Details  Name: [name] MRN: [id number] Date of Birth: 04-03-1956 Referring Provider: Awilda Metro, PA-C   Encounter Date: 06/05/2017  PT End of Session - 06/05/17 1523    Visit Number  1    Number of Visits  12    Date for PT Re-Evaluation  06/26/17    Authorization Type   MCR: Kx mod by 15th visit, Progress note by 10th visit. 5th visit, Progress note by 10th visit.     PT Start Time  1415    PT Stop Time  1500    PT Time Calculation (min)  45 min    Activity Tolerance  Patient tolerated treatment well    Behavior During Therapy  WFL for tasks assessed/performed       Past Medical History:  Diagnosis Date  . Adenomatous polyp of colon   . Anemia   . Arthritis   . Asthma   . Carotid artery disease ([place])   . Carpal tunnel syndrome   . Chronic renal insufficiency    Stage 3 Kidney disease  . Coronary artery disease   . Depression   . Depression with anxiety   . Diabetes mellitus    type II  . Diverticulosis of colon (without mention of hemorrhage)   . GERD (gastroesophageal reflux disease)   . Heart murmur   . History of hiatal hernia   . Hyperlipidemia   . Hypertension   . Hypothyroidism   . Obesity   . PVD (peripheral vascular disease) ([place])   . Thyroid disease    hypo    Past Surgical History:  Procedure Laterality Date  . ABDOMINAL AORTAGRAM N/A 12/18/2013   Procedure: ABDOMINAL [name];  Surgeon: Wellington Hampshire, MD;  Location: [facility];  Service: Cardiovascular;  Laterality: N/A;  . ABDOMINAL AORTOGRAM W/LOWER EXTREMITY N/A 11/16/2016   Procedure: Abdominal Aortogram w/Lower Extremity;  Surgeon: Wellington Hampshire, MD;  Location: [facility];  Service: Cardiovascular;  Laterality: N/A;  . ABDOMINAL AORTOGRAM W/LOWER EXTREMITY N/A 01/25/2017   Procedure: Abdominal Aortogram w/Lower Extremity;  Surgeon: Wellington Hampshire, MD;  Location: [facility];  Service: Cardiovascular;  Laterality: N/A;  only completed Lower Extremity  . ABDOMINAL HYSTERECTOMY    . APPENDECTOMY    . APPLICATION OF WOUND VAC Right 02/13/2017   Procedure: APPLICATION OF INCISIONAL WOUND VAC RIGHT GROIN;  Surgeon: Elam Dutch, MD;  Location: [facility];  Service: Vascular;  Laterality: Right;  . CHOLECYSTECTOMY    . CORONARY ARTERY BYPASS GRAFT  2004   x4  . ENDARTERECTOMY FEMORAL Left 11/23/2016   Procedure: ENDARTERECTOMY OF LEFT EXTERNAL COMMON FEMORAL ARTERY WITH EXTENDED LEFT PROFUNDOPLASTY;  Surgeon: Elam Dutch, MD;  Location: [facility];  Service: Vascular;  Laterality: Left;  . ENDARTERECTOMY FEMORAL Right 02/13/2017   Procedure: ENDARTERECTOMY RIGHT FEMORAL ARTERY WITH PROFUNDAPLASTY;  Surgeon: Elam Dutch, MD;  Location: [facility];  Service: Vascular;  Laterality: Right;  . FEMORAL-POPLITEAL BYPASS GRAFT Left 11/23/2016   Procedure: LEFT FEMORAL-BELOW KNEE POPLITEAL ARTERY BYPASS;  Surgeon: Elam Dutch, MD;  Location: [facility];  Service: Vascular;  Laterality: Left;  . FEMORAL-POPLITEAL BYPASS GRAFT Right 02/13/2017   Procedure: BYPASS GRAFT RIGHT FEMORAL-BELOW KNEE POPLITEAL ARTERY;  Surgeon: Elam Dutch, MD;  Location: [facility];  Service: Vascular;  Laterality: Right;  .  KNEE SURGERY Right 2013   arthroscopy  . PATCH ANGIOPLASTY Right 02/13/2017   Procedure: VEIN PATCH ANGIOPLASTY RIGHT POPLITEAL ARTERY AND HEMASHIELD PATCH ANGIOPLASTY OF RIGHT FEMORAL ARTERY;  Surgeon: Elam Dutch, MD;  Location: [facility];  Service: Vascular;  Laterality: Right;    There were no vitals filed for this visit.   Subjective Assessment - 06/05/17 1421    Subjective  Pt. reports low back pain with insidious onset about 3 years ago that gradually became worse over time. Pt. reports going to pain management clinic and receiving shot for pain. After receiving  shot, pt. reports her pain did not improve and experiencing swelling causing referral to PT.  Pt. notes that pain radiates up right side of her spine and across shoulder blades and that the pain is worse on L vs. R dependent on activity. Pt. notes numbness tingling across shoulder blades.    Pertinent History  history of open herart surgery, recent femoral enderectomy     Limitations  Lifting;Standing    How long can you sit comfortably?  20 minutes     How long can you stand comfortably?  30 minutes     How long can you walk comfortably?  30 minutes     Diagnostic tests  x-rays, MRI's     Patient Stated Goals  pain relief without need for medication    Currently in Pain?  Yes    Pain Score  3  worst 10/10    Pain Location  Back    Pain Orientation  Right;Left    Pain Descriptors / Indicators  Burning;Radiating stinging    Pain Type  Chronic pain    Pain Radiating Towards  up spine towards shoulders     Pain Onset  More than a month ago    Pain Frequency  Constant intermittent with medication    Aggravating Factors   prolonged standing    Pain Relieving Factors  medication        [facility] PT Assessment - 06/05/17 1358      Assessment   Medical Diagnosis  lower back & leg pain, history of spondylosis     Referring Provider  Park Liter Diann, PA-C    Onset Date/Surgical Date  -- chronic pain for about 3 years    Hand Dominance  Right    Next MD Visit  06/21/17    Prior Therapy  Yes for previous shoulder surgery      Precautions   Precautions  Other (comment)    Precaution Comments  no activity below waist for vein surgery      Restrictions   Weight Bearing Restrictions  No      Balance Screen   Has the patient fallen in the past 6 months  Yes    How many times?  1    Has the patient had a decrease in activity level because of a fear of falling?   No    Is the patient reluctant to leave their home because of a fear of falling?   No      Home Environment   Living  Environment  Private residence    Living Arrangements  Alone    Available Help at Discharge  Family    Type of [place] to enter    Entrance Stairs-Number of Steps  3    Entrance Stairs-Rails  Can reach both    [place]  One level  [place] - quad;Walker - 2 wheels;Crutches;Shower seat;Bedside commode      Prior Function   Level of Independence  Independent    Vocation  Retired      Charity fundraiser Status  Within Functional Limits for tasks assessed      Observation/Other Assessments   Focus on Therapeutic Outcomes (FOTO)   60% limited  predicted to be 50% limited      AROM   AROM Assessment Site  Lumbar    Lumbar Flexion  50 pain throughout motion    Lumbar Extension  20 pain throughout motion    Lumbar - Right Side Bend  15 pain throughout motion    Lumbar - Left Side Bend  20 pain throughout motion      Strength   Strength Assessment Site  Hip;Lumbar    Right/Left Hip  Right;Left    Right Hip Flexion  3+/5    Right Hip Extension  3/5    Right Hip ABduction  4-/5    Right Hip ADduction  3+/5    Left Hip Flexion  4-/5    Left Hip Extension  3+/5    Left Hip ABduction  4-/5    Left Hip ADduction  3+/5      Palpation   Spinal mobility  T8-L5 hypomobile with pain, pain increased at lower segments    SI assessment   pain with sacral thrust, TTP over sacrum     Palpation comment  TTP over bilateral thoracolumbar paraspinals with noticable spasms      Special Tests    Special Tests  Sacrolliac Tests    Sacroiliac Tests   Gaenslen's Test      Sacral thrust    Findings  Positive    Side  Right    Comments  also positive on the Lt      Gaenslen's test   Findings  Positive    Side   Right    Comments  also positive on Lt      Bed Mobility   Bed Mobility  Supine to Sit pt. educated on supine to sit using log roll     Supine to Sit  6: Modified independent (Device/Increase time)      Ambulation/Gait   Gait  Pattern  Decreased step length - right;Decreased step length - left;Antalgic;Narrow base of support;Poor foot clearance - left;Poor foot clearance - right;Trunk flexed       Objective measurements completed on examination: See above findings.    PT Education - 06/05/17 1522    Education provided  Yes    Education Details  pt. educated on HEP and POC, pt. educated on proper supine to sit mechanics using log rolling technique     Person(s) Educated  Patient    Methods  Explanation;Handout;Demonstration    Comprehension  Verbalized understanding;Returned demonstration       PT Short Term Goals - 06/05/17 1634      PT SHORT TERM GOAL #1   Title  Pt. will be I with initial HEP.     Time  3    Period  Weeks    Status  New    Target Date  06/26/17      PT SHORT TERM GOAL #2   Title  Pt. will demonstrate proper log roll technique for supine to sit transfer to prevent excessive strain on low back during functional transfer.    Time  3    Period  Weeks  Status  New    Target Date  06/26/17       PT Long Term Goals - 06/22/17 1635      PT LONG TERM GOAL #1   Title  Pt. will increase lumbar ROM in all planes >/= 5 degrees with </= 2/10 pain for increased ability to perform functional transfers such as supine to sit.     Time  6    Period  Weeks    Status  New    Target Date  07/17/17      PT LONG TERM GOAL #2   Title  Pt. will increase overall bilateral hip strength >/= 4/5 for increased LE stability for improved gait pattern.    Time  6    Period  Weeks    Status  New    Target Date  07/17/17      PT LONG TERM GOAL #3   Title  Pt. will increase standing tolerance to >/= 30 minutes with decreased pain </= 2/10 for increased ability to perform community ambulation.    Time  6    Period  Weeks    Status  New    Target Date  07/17/17      PT LONG TERM GOAL #4   Title  Pt. will increase FOTO score and decrease limitations to </= 50%.     Time  6    Period  Weeks     Status  New    Target Date  07/17/17        Plan - 06/22/17 1526    Clinical Impression Statement  Pt. is 61 yo female referred to [facility] with CC of low back pain. Pt. presents with limited mobility in lumbar spine, painful movements, and deficits in bilateral hip strength. Pt. was TTP over bilateral paraspinals with hypomobility noted in T8-L5 and sacrum. Special testing indicates possible SI joint involvement. Pt. will benefit from physical therapy to address hypomobility and deficits in strength for pain relief and increased functional mobility.    History and Personal Factors relevant to plan of care:  history of recent femoral enderectomy, history of open heart surgery, arthritis     Clinical Presentation  Evolving    Clinical Presentation due to:  pain, hypomobility, gait instability     Clinical Decision Making  Moderate    Rehab Potential  Good    PT Frequency  2x / week    PT Duration  6 weeks    PT Treatment/Interventions  ADLs/Self Care Home Management;Cryotherapy;Electrical Stimulation;Moist Heat;Iontophoresis 4mg /ml Dexamethasone;Traction;Gait training;Functional mobility training;Therapeutic activities;Therapeutic exercise;Balance training;Patient/family education;Manual techniques;Dry needling;Taping    PT Next Visit Plan  assess HEP, Berg balance test, test for direction preference, manual therapy, lumbar stretching, hip/core strengthening     PT Home Exercise Plan  supine pelvic tilt, lower trunk rotation, prone on elbows     Consulted and Agree with Plan of Care  Patient       Patient will benefit from skilled therapeutic intervention in order to improve the following deficits and impairments:  Pain, Postural dysfunction, Hypomobility, Abnormal gait, Decreased strength, Increased muscle spasms, Decreased range of motion  Visit Diagnosis: Chronic bilateral low back pain, with sciatica presence unspecified  Muscle spasm of back  Muscle weakness (generalized)  Other  abnormalities of gait and mobility  G-Codes - 06-22-2017 1734    Functional Assessment Tool Used (Outpatient Only)  ROM, pain, spasm, FOTO/ clinical judgement    Functional Limitation  Changing and maintaining body position  Changing and Maintaining Body Position Current Status [phone])  At least 60 percent but less than 80 percent impaired, limited or restricted    Changing and Maintaining Body Position Goal Status ([id number])  At least 20 percent but less than 40 percent impaired, limited or restricted      Problem List Patient Active Problem List   Diagnosis Date Noted  . Pressure injury of skin 11/25/2016  . PAD (peripheral artery disease) ([place]) 11/23/2016  . Major depressive disorder, recurrent episode, moderate ([place]) 04/04/2016  . Disorder resulting from impaired renal function 10/27/2009  . Bilateral carotid artery stenosis 10/08/2009  . Essential hypertension 10/02/2008  . GERD 03/11/2008  . COLONIC POLYPS 03/10/2008  . Type II or unspecified type diabetes mellitus without mention of complication, not stated as uncontrolled 03/10/2008  . Hyperlipidemia 03/10/2008  . ANXIETY DEPRESSION 03/10/2008  . Coronary atherosclerosis 03/10/2008  . Peripheral vascular disease ([place]) 03/10/2008  . DIVERTICULOSIS, COLON 03/10/2008    Eulis Manly, SPT 06/05/17 5:42 PM   [place], [place], [address] Phone: [phone]   Fax:  [phone]  Name: [name] MRN: [id number] Date of Birth: 22-Feb-1956

## 2017-06-09 ENCOUNTER — Other Ambulatory Visit: Payer: Self-pay | Admitting: Cardiology

## 2017-06-13 ENCOUNTER — Ambulatory Visit: Payer: Medicare Other | Attending: Physician Assistant | Admitting: Physical Therapy

## 2017-06-13 ENCOUNTER — Encounter: Payer: Self-pay | Admitting: Physical Therapy

## 2017-06-13 DIAGNOSIS — M6281 Muscle weakness (generalized): Secondary | ICD-10-CM | POA: Diagnosis present

## 2017-06-13 DIAGNOSIS — G8929 Other chronic pain: Secondary | ICD-10-CM | POA: Diagnosis present

## 2017-06-13 DIAGNOSIS — M62830 Muscle spasm of back: Secondary | ICD-10-CM | POA: Insufficient documentation

## 2017-06-13 DIAGNOSIS — R2689 Other abnormalities of gait and mobility: Secondary | ICD-10-CM

## 2017-06-13 DIAGNOSIS — M545 Low back pain: Secondary | ICD-10-CM | POA: Insufficient documentation

## 2017-06-13 NOTE — Therapy (Signed)
[place], [place], [address] Phone: [phone]   Fax:  [phone]  Physical Therapy Treatment  Patient Details  Name: [name] MRN: [id number] Date of Birth: 10/19/1955 Referring Provider: Awilda Metro, PA-C   Encounter Date: 06/13/2017  PT End of Session - 06/13/17 1520    Visit Number  2    Number of Visits  12    Date for PT Re-Evaluation  07/17/16    Authorization Type   MCR: Kx mod by 15th visit, Progress note by 10th visit. 5th visit, Progress note by 10th visit.     PT Start Time  0300    PT Stop Time  0343    PT Time Calculation (min)  43 min       Past Medical History:  Diagnosis Date  . Adenomatous polyp of colon   . Anemia   . Arthritis   . Asthma   . Carotid artery disease ([place])   . Carpal tunnel syndrome   . Chronic renal insufficiency    Stage 3 Kidney disease  . Coronary artery disease   . Depression   . Depression with anxiety   . Diabetes mellitus    type II  . Diverticulosis of colon (without mention of hemorrhage)   . GERD (gastroesophageal reflux disease)   . Heart murmur   . History of hiatal hernia   . Hyperlipidemia   . Hypertension   . Hypothyroidism   . Obesity   . PVD (peripheral vascular disease) ([place])   . Thyroid disease    hypo    Past Surgical History:  Procedure Laterality Date  . ABDOMINAL AORTAGRAM N/A 12/18/2013   Procedure: ABDOMINAL [name];  Surgeon: Wellington Hampshire, MD;  Location: [facility];  Service: Cardiovascular;  Laterality: N/A;  . ABDOMINAL AORTOGRAM W/LOWER EXTREMITY N/A 11/16/2016   Procedure: Abdominal Aortogram w/Lower Extremity;  Surgeon: Wellington Hampshire, MD;  Location: [facility];  Service: Cardiovascular;  Laterality: N/A;  . ABDOMINAL AORTOGRAM W/LOWER EXTREMITY N/A 01/25/2017   Procedure: Abdominal Aortogram w/Lower Extremity;  Surgeon: Wellington Hampshire, MD;  Location: [facility];  Service:  Cardiovascular;  Laterality: N/A;  only completed Lower Extremity  . ABDOMINAL HYSTERECTOMY    . APPENDECTOMY    . APPLICATION OF WOUND VAC Right 02/13/2017   Procedure: APPLICATION OF INCISIONAL WOUND VAC RIGHT GROIN;  Surgeon: Elam Dutch, MD;  Location: [facility];  Service: Vascular;  Laterality: Right;  . CHOLECYSTECTOMY    . CORONARY ARTERY BYPASS GRAFT  2004   x4  . ENDARTERECTOMY FEMORAL Left 11/23/2016   Procedure: ENDARTERECTOMY OF LEFT EXTERNAL COMMON FEMORAL ARTERY WITH EXTENDED LEFT PROFUNDOPLASTY;  Surgeon: Elam Dutch, MD;  Location: [facility];  Service: Vascular;  Laterality: Left;  . ENDARTERECTOMY FEMORAL Right 02/13/2017   Procedure: ENDARTERECTOMY RIGHT FEMORAL ARTERY WITH PROFUNDAPLASTY;  Surgeon: Elam Dutch, MD;  Location: [facility];  Service: Vascular;  Laterality: Right;  . FEMORAL-POPLITEAL BYPASS GRAFT Left 11/23/2016   Procedure: LEFT FEMORAL-BELOW KNEE POPLITEAL ARTERY BYPASS;  Surgeon: Elam Dutch, MD;  Location: [facility];  Service: Vascular;  Laterality: Left;  . FEMORAL-POPLITEAL BYPASS GRAFT Right 02/13/2017   Procedure: BYPASS GRAFT RIGHT FEMORAL-BELOW KNEE POPLITEAL ARTERY;  Surgeon: Elam Dutch, MD;  Location: [facility];  Service: Vascular;  Laterality: Right;  . KNEE SURGERY Right 2013   arthroscopy  . PATCH ANGIOPLASTY Right 02/13/2017   Procedure: VEIN PATCH ANGIOPLASTY RIGHT  POPLITEAL ARTERY AND HEMASHIELD PATCH ANGIOPLASTY OF RIGHT FEMORAL ARTERY;  Surgeon: Elam Dutch, MD;  Location: [facility];  Service: Vascular;  Laterality: Right;    There were no vitals filed for this visit.                   [facility] Adult PT Treatment/Exercise - 06/13/17 0001      Transfers   Transfers  Supine to Sit    Supine to Sit  7: Independent      Lumbar Exercises: Stretches   Single Knee to Chest Stretch  2 reps;30 seconds    Lower Trunk Rotation  10 seconds    Pelvic Tilt  10 seconds    Prone on Elbows Stretch  60 seconds    Prone on Elbows  Stretch Limitations  increased pressure in mid to low back       Lumbar Exercises: Supine   Ab Set  5 reps    Glut Set  10 reps    Clam  10 reps    Bent Knee Raise  10 reps               PT Short Term Goals - 06/05/17 1634      PT SHORT TERM GOAL #1   Title  Pt. will be I with initial HEP.     Time  3    Period  Weeks    Status  New    Target Date  06/26/17      PT SHORT TERM GOAL #2   Title  Pt. will demonstrate proper log roll technique for supine to sit transfer to prevent excessive strain on low back during functional transfer.    Time  3    Period  Weeks    Status  New    Target Date  06/26/17        PT Long Term Goals - 06/05/17 1635      PT LONG TERM GOAL #1   Title  Pt. will increase lumbar ROM in all planes >/= 5 degrees with </= 2/10 pain for increased ability to perform functional transfers such as supine to sit.     Time  6    Period  Weeks    Status  New    Target Date  07/17/17      PT LONG TERM GOAL #2   Title  Pt. will increase overall bilateral hip strength >/= 4/5 for increased LE stability for improved gait pattern.    Time  6    Period  Weeks    Status  New    Target Date  07/17/17      PT LONG TERM GOAL #3   Title  Pt. will increase standing tolerance to >/= 30 minutes with decreased pain </= 2/10 for increased ability to perform community ambulation.    Time  6    Period  Weeks    Status  New    Target Date  07/17/17      PT LONG TERM GOAL #4   Title  Pt. will increase FOTO score and decrease limitations to </= 50%.     Time  6    Period  Weeks    Status  New    Target Date  07/17/17            Plan - 06/13/17 1557    Clinical Impression Statement  Pt arrives reporting intermitent compliance with HEP. She has taken meds today to be  able to come to PT today. Prone on elbows increases lumbar pressure and she is not having leg pain today. Continued with flexion biased exercises with some felling of discomfort in lower back.  Updated HEP with gentle core hooklying exercises. Adjusted pts schedule to once every other week frequency per her request. She is still healing form surgery and undergoes dressing changes in her home.     PT Next Visit Plan  assess HEP, Berg balance test, test for direction preference, manual therapy, lumbar stretching, hip/core strengthening     PT Home Exercise Plan  supine pelvic tilt, lower trunk rotation, prone on elbows, ball squeeze, bent knee raise, supine clam without band     Consulted and Agree with Plan of Care  Patient       Patient will benefit from skilled therapeutic intervention in order to improve the following deficits and impairments:  Pain, Postural dysfunction, Hypomobility, Abnormal gait, Decreased strength, Increased muscle spasms, Decreased range of motion  Visit Diagnosis: Chronic bilateral low back pain, with sciatica presence unspecified  Muscle spasm of back  Muscle weakness (generalized)  Other abnormalities of gait and mobility     Problem List Patient Active Problem List   Diagnosis Date Noted  . Pressure injury of skin 11/25/2016  . PAD (peripheral artery disease) ([place]) 11/23/2016  . Major depressive disorder, recurrent episode, moderate ([place]) 04/04/2016  . Disorder resulting from impaired renal function 10/27/2009  . Bilateral carotid artery stenosis 10/08/2009  . Essential hypertension 10/02/2008  . GERD 03/11/2008  . COLONIC POLYPS 03/10/2008  . Type II or unspecified type diabetes mellitus without mention of complication, not stated as uncontrolled 03/10/2008  . Hyperlipidemia 03/10/2008  . ANXIETY DEPRESSION 03/10/2008  . Coronary atherosclerosis 03/10/2008  . Peripheral vascular disease ([place]) 03/10/2008  . DIVERTICULOSIS, COLON 03/10/2008    Dorene Ar, PTA 06/13/2017, 4:09 PM  [place], [place], [address] Phone: [phone]   Fax:   [phone]  Name: [name] MRN: [id number] Date of Birth: 03-15-1956

## 2017-06-20 ENCOUNTER — Encounter: Payer: Self-pay | Admitting: Physical Therapy

## 2017-06-20 ENCOUNTER — Ambulatory Visit: Payer: Medicare Other | Admitting: Cardiovascular Disease

## 2017-06-27 ENCOUNTER — Ambulatory Visit: Payer: Medicare Other | Admitting: Physical Therapy

## 2017-06-27 ENCOUNTER — Encounter: Payer: Self-pay | Admitting: Cardiovascular Disease

## 2017-06-27 ENCOUNTER — Ambulatory Visit (INDEPENDENT_AMBULATORY_CARE_PROVIDER_SITE_OTHER): Payer: Medicare Other | Admitting: Cardiovascular Disease

## 2017-06-27 ENCOUNTER — Encounter: Payer: Self-pay | Admitting: Physical Therapy

## 2017-06-27 VITALS — BP 88/42 | HR 70 | Ht 63.0 in | Wt 169.0 lb

## 2017-06-27 DIAGNOSIS — G8929 Other chronic pain: Secondary | ICD-10-CM

## 2017-06-27 DIAGNOSIS — M545 Low back pain: Principal | ICD-10-CM

## 2017-06-27 DIAGNOSIS — I10 Essential (primary) hypertension: Secondary | ICD-10-CM | POA: Diagnosis not present

## 2017-06-27 DIAGNOSIS — M6281 Muscle weakness (generalized): Secondary | ICD-10-CM

## 2017-06-27 DIAGNOSIS — R2689 Other abnormalities of gait and mobility: Secondary | ICD-10-CM

## 2017-06-27 DIAGNOSIS — I779 Disorder of arteries and arterioles, unspecified: Secondary | ICD-10-CM

## 2017-06-27 DIAGNOSIS — E7800 Pure hypercholesterolemia, unspecified: Secondary | ICD-10-CM

## 2017-06-27 DIAGNOSIS — I6523 Occlusion and stenosis of bilateral carotid arteries: Secondary | ICD-10-CM | POA: Diagnosis not present

## 2017-06-27 DIAGNOSIS — I2510 Atherosclerotic heart disease of native coronary artery without angina pectoris: Secondary | ICD-10-CM

## 2017-06-27 DIAGNOSIS — I739 Peripheral vascular disease, unspecified: Secondary | ICD-10-CM | POA: Diagnosis not present

## 2017-06-27 DIAGNOSIS — M62830 Muscle spasm of back: Secondary | ICD-10-CM

## 2017-06-27 MED ORDER — AMLODIPINE BESYLATE 5 MG PO TABS: 5.0000 mg | ORAL_TABLET | Freq: Every day | ORAL | 3 refills | Status: DC

## 2017-06-27 MED ORDER — RIVAROXABAN 2.5 MG PO TABS: 2.5000 mg | ORAL_TABLET | Freq: Two times a day (BID) | ORAL | 6 refills | Status: DC

## 2017-06-27 NOTE — Patient Instructions (Signed)
Medication Instructions:   STOP CILOSTAZOL  START XARELTO 2.5 MG ONE TABLET TWICE DAILY  DECREASE AMLODIPINE TO 5 MG ONCE DAILY= 1/2 OF THE 10 MG TABLET ONCE DAILY  Testing/Procedures:  Your physician has requested that you have a carotid duplex. This test is an ultrasound of the carotid arteries in your neck. It looks at blood flow through these arteries that supply the brain with blood. Allow one hour for this exam. There are no restrictions or special instructions.    Follow-Up:  Your physician wants you to follow-up in: [place] will receive a reminder letter in the mail two months in advance. If you don't receive a letter, please call our office to schedule the follow-up appointment.   If you need a refill on your cardiac medications before your next appointment, please call your pharmacy.

## 2017-06-27 NOTE — Therapy (Addendum)
[place], [place], [address] Phone: [phone]   Fax:  [phone]  Physical Therapy Treatment / Discharge summary  Patient Details  Name: [name] MRN: [id number] Date of Birth: 08-Dec-1955 Referring Provider: Awilda Metro, PA-C   Encounter Date: 06/27/2017  PT End of Session - 06/27/17 1448    Visit Number  3    Number of Visits  12    Date for PT Re-Evaluation  07/17/16    Authorization Type   MCR: Kx mod by 15th visit, Progress note by 10th visit. 5th visit, Progress note by 10th visit.     PT Start Time  1450    PT Stop Time  1538    PT Time Calculation (min)  48 min    Activity Tolerance  Patient tolerated treatment well    Behavior During Therapy  WFL for tasks assessed/performed       Past Medical History:  Diagnosis Date  . Adenomatous polyp of colon   . Anemia   . Arthritis   . Asthma   . Carotid artery disease ([place])   . Carpal tunnel syndrome   . Chronic renal insufficiency    Stage 3 Kidney disease  . Coronary artery disease   . Depression   . Depression with anxiety   . Diabetes mellitus    type II  . Diverticulosis of colon (without mention of hemorrhage)   . GERD (gastroesophageal reflux disease)   . Heart murmur   . History of hiatal hernia   . Hyperlipidemia   . Hypertension   . Hypothyroidism   . Obesity   . PVD (peripheral vascular disease) ([place])   . Thyroid disease    hypo    Past Surgical History:  Procedure Laterality Date  . ABDOMINAL AORTAGRAM N/A 12/18/2013   Procedure: ABDOMINAL [name];  Surgeon: Wellington Hampshire, MD;  Location: [facility];  Service: Cardiovascular;  Laterality: N/A;  . ABDOMINAL AORTOGRAM W/LOWER EXTREMITY N/A 11/16/2016   Procedure: Abdominal Aortogram w/Lower Extremity;  Surgeon: Wellington Hampshire, MD;  Location: [facility];  Service: Cardiovascular;  Laterality: N/A;  . ABDOMINAL AORTOGRAM W/LOWER EXTREMITY N/A  01/25/2017   Procedure: Abdominal Aortogram w/Lower Extremity;  Surgeon: Wellington Hampshire, MD;  Location: [facility];  Service: Cardiovascular;  Laterality: N/A;  only completed Lower Extremity  . ABDOMINAL HYSTERECTOMY    . APPENDECTOMY    . APPLICATION OF WOUND VAC Right 02/13/2017   Procedure: APPLICATION OF INCISIONAL WOUND VAC RIGHT GROIN;  Surgeon: Elam Dutch, MD;  Location: [facility];  Service: Vascular;  Laterality: Right;  . CHOLECYSTECTOMY    . CORONARY ARTERY BYPASS GRAFT  2004   x4  . ENDARTERECTOMY FEMORAL Left 11/23/2016   Procedure: ENDARTERECTOMY OF LEFT EXTERNAL COMMON FEMORAL ARTERY WITH EXTENDED LEFT PROFUNDOPLASTY;  Surgeon: Elam Dutch, MD;  Location: [facility];  Service: Vascular;  Laterality: Left;  . ENDARTERECTOMY FEMORAL Right 02/13/2017   Procedure: ENDARTERECTOMY RIGHT FEMORAL ARTERY WITH PROFUNDAPLASTY;  Surgeon: Elam Dutch, MD;  Location: [facility];  Service: Vascular;  Laterality: Right;  . FEMORAL-POPLITEAL BYPASS GRAFT Left 11/23/2016   Procedure: LEFT FEMORAL-BELOW KNEE POPLITEAL ARTERY BYPASS;  Surgeon: Elam Dutch, MD;  Location: [facility];  Service: Vascular;  Laterality: Left;  . FEMORAL-POPLITEAL BYPASS GRAFT Right 02/13/2017   Procedure: BYPASS GRAFT RIGHT FEMORAL-BELOW KNEE POPLITEAL ARTERY;  Surgeon: Elam Dutch, MD;  Location: [facility];  Service: Vascular;  Laterality: Right;  . KNEE SURGERY Right 2013   arthroscopy  . PATCH ANGIOPLASTY Right 02/13/2017   Procedure: VEIN PATCH ANGIOPLASTY RIGHT POPLITEAL ARTERY AND HEMASHIELD PATCH ANGIOPLASTY OF RIGHT FEMORAL ARTERY;  Surgeon: Elam Dutch, MD;  Location: [facility];  Service: Vascular;  Laterality: Right;    There were no vitals filed for this visit.  Subjective Assessment - 06/27/17 1448    Subjective  "After the last session I felt like I had lost sensation inthe RLE I wasn't sure if it was from the back or if it was from the exercises"    Currently in Pain?  Yes    Pain Score  3   last took pain medicaiton this morning    Pain Orientation  Right;Left    Pain Descriptors / Indicators  Radiating;Aching    Pain Type  Chronic pain    Pain Onset  More than a month ago    Pain Frequency  Constant    Aggravating Factors   getting up/ down, and climbing in bed    Pain Relieving Factors  medication, resting and massage                      [facility] Adult PT Treatment/Exercise - 06/27/17 1506      Lumbar Exercises: Stretches   Lower Trunk Rotation  -- 1 x 15      Lumbar Exercises: Supine   Ab Set  10 reps;5 seconds in decompression position    Other Supine Lumbar Exercises  in decompression position: pelvic tilt 2 x 10 with 3 sec hold     Other Supine Lumbar Exercises  with heels/ knees on physio ball rocking side to side 2 x 10      Modalities   Modalities  Moist Heat      Moist Heat Therapy   Number Minutes Moist Heat  10 Minutes    Moist Heat Location  Lumbar Spine in supine decompression pos.      Manual Therapy   Manual Therapy  Manual Traction    Manual therapy comments  MTPR along L lumbar paraspinals    Manual Traction  5 min intermittent pulling for 30 sec and relaxing 30 sec increased soreness, treatment was halted at 3 min             PT Education - 06/27/17 1531    Education provided  Yes    Education Details  updated HEP for decompression position and how to perform at home.     Person(s) Educated  Patient    Methods  Explanation;Verbal cues    Comprehension  Verbalized understanding;Verbal cues required       PT Short Term Goals - 06/27/17 1533      PT SHORT TERM GOAL #1   Title  Pt. will be I with initial HEP.     Time  3    Period  Weeks    Status  Partially Met      PT SHORT TERM GOAL #2   Title  Pt. will demonstrate proper log roll technique for supine to sit transfer to prevent excessive strain on low back during functional transfer.    Period  Weeks    Status  Partially Met        PT Long Term Goals -  06/05/17 1635      PT LONG TERM GOAL #1   Title  Pt. will increase lumbar ROM in all planes >/= 5 degrees with </=  2/10 pain for increased ability to perform functional transfers such as supine to sit.     Time  6    Period  Weeks    Status  New    Target Date  07/17/17      PT LONG TERM GOAL #2   Title  Pt. will increase overall bilateral hip strength >/= 4/5 for increased LE stability for improved gait pattern.    Time  6    Period  Weeks    Status  New    Target Date  07/17/17      PT LONG TERM GOAL #3   Title  Pt. will increase standing tolerance to >/= 30 minutes with decreased pain </= 2/10 for increased ability to perform community ambulation.    Time  6    Period  Weeks    Status  New    Target Date  07/17/17      PT LONG TERM GOAL #4   Title  Pt. will increase FOTO score and decrease limitations to </= 50%.     Time  6    Period  Weeks    Status  New    Target Date  07/17/17            Plan - 06/27/17 1531    Clinical Impression Statement  pt continues to report 3/10 pain today with use of medication. Focused on flexion biased treatment utilizing decompression position which was provided as an HEP today. performed core strengthening which she reported some reduced radiating pain. attempted manual traction which she reported minimal improvement and tightness inthe low back so traction was halted. utilized MHP post session in decompression psoition to calm down soreness.    PT Next Visit Plan  assess HEP, Berg balance test, flexion biased tx, manual therapy, lumbar stretching, hip/core strengthening     PT Home Exercise Plan  supine pelvic tilt, lower trunk rotation, prone on elbows, ball squeeze, bent knee raise, supine clam without band , decompression position.     Consulted and Agree with Plan of Care  Patient       Patient will benefit from skilled therapeutic intervention in order to improve the following deficits and impairments:  Pain, Postural dysfunction,  Hypomobility, Abnormal gait, Decreased strength, Increased muscle spasms, Decreased range of motion  Visit Diagnosis: Chronic bilateral low back pain, with sciatica presence unspecified  Muscle spasm of back  Muscle weakness (generalized)  Other abnormalities of gait and mobility     Problem List Patient Active Problem List   Diagnosis Date Noted  . Pressure injury of skin 11/25/2016  . PAD (peripheral artery disease) ([place]) 11/23/2016  . Major depressive disorder, recurrent episode, moderate ([place]) 04/04/2016  . Disorder resulting from impaired renal function 10/27/2009  . Bilateral carotid artery stenosis 10/08/2009  . Essential hypertension 10/02/2008  . GERD 03/11/2008  . COLONIC POLYPS 03/10/2008  . Type II or unspecified type diabetes mellitus without mention of complication, not stated as uncontrolled 03/10/2008  . Hyperlipidemia 03/10/2008  . ANXIETY DEPRESSION 03/10/2008  . Coronary atherosclerosis 03/10/2008  . Peripheral vascular disease ([place]) 03/10/2008  . DIVERTICULOSIS, COLON 03/10/2008   Starr Lake PT, DPT, LAT, ATC  06/27/17  3:41 PM      [facility] [address] [place], [place], [address] Phone: [phone]   Fax:  [phone]  Name: [name] MRN: [id number] Date of Birth: [birth year]       PHYSICAL THERAPY DISCHARGE SUMMARY  Visits  from Start of Care: 3  Current functional level related to goals / functional outcomes: See goals   Remaining deficits: unknown   Education / Equipment: HEP  Plan: Patient agrees to discharge.  Patient goals were not met. Patient is being discharged due to not returning since the last visit.  ?????        Jordani Nunn PT, DPT, LAT, ATC  09/14/17  1:50 PM

## 2017-06-27 NOTE — Progress Notes (Signed)
Cardiology Office Note   Date:  06/27/2017   ID:  [name], DOB [birth year], MRN [id number]  PCP:  Mercedes Rough, PA-C  Cardiologist:   Dr. Stanford Breed  No chief complaint on file.     History of Present Illness: [name] is a 61 y.o. female who presents for a followup visit regarding peripheral arterial disease. She has known history of  coronary artery status post coronary bypass graft surgery in 2004. She is known to have moderate bilateral carotid stenosis.  She also has prolonged history of diabetes with  chronic kidney disease and  diabetic neuropathy. 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  She is s/p left external iliac/common femoral and profunda endarterectomy with extended profundoplasty, ligation of the left superficial femoral artery and femoral to below the knee popliteal artery bypass with a vein graft in 05/18 and more recently right femoral to below-knee popliteal artery bypass in August.  Revascularization was performed for critical limb ischemia.  She has been doing extremely well with no claudication or ulceration.  She is able to walk as far as she wants without symptoms.  Blood pressure tends to fluctuate.  She does complain of occasional dizziness.  Past Medical History:  Diagnosis Date  . Adenomatous polyp of colon   . Anemia   . Arthritis   . Asthma   . Carotid artery disease ([place])   . Carpal tunnel syndrome   . Chronic renal insufficiency    Stage 3 Kidney disease  . Coronary artery disease   . Depression   . Depression with anxiety   . Diabetes mellitus    type II  . Diverticulosis of colon (without mention of hemorrhage)   . GERD (gastroesophageal reflux disease)   . Heart murmur   . History of hiatal hernia   . Hyperlipidemia   . Hypertension   . Hypothyroidism   . Obesity   . PVD (peripheral vascular disease) ([place])   . Thyroid disease    hypo    Past Surgical History:  Procedure  Laterality Date  . ABDOMINAL AORTAGRAM N/A 12/18/2013   Procedure: ABDOMINAL [name];  Surgeon: Mercedes Hampshire, MD;  Location: [facility];  Service: Cardiovascular;  Laterality: N/A;  . ABDOMINAL AORTOGRAM W/LOWER EXTREMITY N/A 11/16/2016   Procedure: Abdominal Aortogram w/Lower Extremity;  Surgeon: Mercedes Hampshire, MD;  Location: [facility];  Service: Cardiovascular;  Laterality: N/A;  . ABDOMINAL AORTOGRAM W/LOWER EXTREMITY N/A 01/25/2017   Procedure: Abdominal Aortogram w/Lower Extremity;  Surgeon: Mercedes Hampshire, MD;  Location: [facility];  Service: Cardiovascular;  Laterality: N/A;  only completed Lower Extremity  . ABDOMINAL HYSTERECTOMY    . APPENDECTOMY    . APPLICATION OF WOUND VAC Right 02/13/2017   Procedure: APPLICATION OF INCISIONAL WOUND VAC RIGHT GROIN;  Surgeon: Mercedes Dutch, MD;  Location: [facility];  Service: Vascular;  Laterality: Right;  . CHOLECYSTECTOMY    . CORONARY ARTERY BYPASS GRAFT  2004   x4  . ENDARTERECTOMY FEMORAL Left 11/23/2016   Procedure: ENDARTERECTOMY OF LEFT EXTERNAL COMMON FEMORAL ARTERY WITH EXTENDED LEFT PROFUNDOPLASTY;  Surgeon: Mercedes Dutch, MD;  Location: [facility];  Service: Vascular;  Laterality: Left;  . ENDARTERECTOMY FEMORAL Right 02/13/2017   Procedure: ENDARTERECTOMY RIGHT FEMORAL ARTERY WITH PROFUNDAPLASTY;  Surgeon: Mercedes Dutch, MD;  Location: [facility];  Service: Vascular;  Laterality: Right;  . FEMORAL-POPLITEAL BYPASS GRAFT Left 11/23/2016  Procedure: LEFT FEMORAL-BELOW KNEE POPLITEAL ARTERY BYPASS;  Surgeon: Mercedes Dutch, MD;  Location: [facility];  Service: Vascular;  Laterality: Left;  . FEMORAL-POPLITEAL BYPASS GRAFT Right 02/13/2017   Procedure: BYPASS GRAFT RIGHT FEMORAL-BELOW KNEE POPLITEAL ARTERY;  Surgeon: Mercedes Dutch, MD;  Location: [facility];  Service: Vascular;  Laterality: Right;  . KNEE SURGERY Right 2013   arthroscopy  . PATCH ANGIOPLASTY Right 02/13/2017   Procedure: VEIN PATCH ANGIOPLASTY RIGHT  POPLITEAL ARTERY AND HEMASHIELD PATCH ANGIOPLASTY OF RIGHT FEMORAL ARTERY;  Surgeon: Mercedes Dutch, MD;  Location[facility];  Service: Vascular;  Laterality: Right;     Current Outpatient Medications  Medication Sig Dispense Refill  . albuterol (PROVENTIL HFA;VENTOLIN HFA) 108 (90 BASE) MCG/ACT inhaler Inhale 2 puffs into the lungs every 6 (six) hours as needed for wheezing or shortness of breath.     . ALPRAZolam (XANAX) 1 MG tablet Take 1 mg by mouth 2 (two) times daily as needed for anxiety.     Marland Kitchen amLODipine (NORVASC) 10 MG tablet Take 10 mg by mouth at bedtime.     Marland Kitchen aspirin EC 81 MG tablet Take 81 mg by mouth at bedtime.    Marland Kitchen atorvastatin (LIPITOR) 80 MG tablet Take 80 mg by mouth every morning.     . B-D INS SYR ULTRAFINE 1CC/31G 31G X 5/16" 1 ML MISC USE NEW NEEDLE FOR MEAL TIME 3 TIMES A DAY AND LONG ACTING INSULIN TWICE A DAY  11  . cilostazol (PLETAL) 100 MG tablet Take 1 tablet (100 mg total) by mouth 2 (two) times daily. 180 tablet 0  . fenofibrate micronized (LOFIBRA) 200 MG capsule Take 200 mg by mouth at bedtime.     . ferrous sulfate 325 (65 FE) MG tablet Take 325 mg by mouth 3 (three) times daily.  3  . furosemide (LASIX) 40 MG tablet Take 40 mg by mouth 2 (two) times daily.     . Insulin Glargine (BASAGLAR KWIKPEN) 100 UNIT/ML SOPN Inject 20-60 Units into the skin 2 (two) times daily before a meal. Sliding Scale  3  . levothyroxine (SYNTHROID, LEVOTHROID) 175 MCG tablet Take 175 mcg by mouth daily before breakfast.     . LYRICA 75 MG capsule Take 75 mg by mouth at bedtime as needed for pain.  1  . metoprolol (LOPRESSOR) 100 MG tablet Take 100 mg by mouth 2 (two) times daily. Takes (1) 100 mg tab and (1) 50 mg tab together to equal 150 mg    . metoprolol (LOPRESSOR) 50 MG tablet Take 50 mg by mouth 2 (two) times daily. Takes 100 mg and a 50 mg to equal 150 mg twice daily  6  . NARCAN 4 MG/0.1ML LIQD nasal spray kit Place 1 spray into both nostrils See admin instructions. As  needed in case of an overdose  0  . Omega-3 Fatty Acids (FISH OIL) 1000 MG CAPS Take 1,000 mg by mouth 2 (two) times daily.    Marland Kitchen omeprazole (PRILOSEC) 40 MG capsule Take 40 mg by mouth daily.    Glory Rosebush VERIO test strip CHECK SUGAR AT HOME 4-5 TIMES A DAY  11  . oxyCODONE (OXYCONTIN) 15 mg 12 hr tablet Take 15 mg by mouth every 12 (twelve) hours.    Marland Kitchen oxyCODONE-acetaminophen (PERCOCET/ROXICET) 5-325 MG tablet Take 1 tablet by mouth every 8 (eight) hours as needed for severe pain. 14 tablet 0  . PARoxetine (PAXIL) 40 MG tablet Take 40 mg by mouth at bedtime.     Marland Kitchen  promethazine (PHENERGAN) 25 MG tablet Take 25 mg by mouth daily as needed for nausea or vomiting.   6  . sertraline (ZOLOFT) 100 MG tablet Take 100 mg by mouth daily.    . Vitamin D, Ergocalciferol, (DRISDOL) 50000 UNITS CAPS capsule Take 50,000 Units by mouth once a week. mondays  1  . VOLTAREN 1 % GEL APPLY TO AFFECTED AREA 4 GRAMS 3 TIMES DAILY AS NEEDED  1   No current facility-administered medications for this visit.     Allergies:   Patient has no known allergies.    Social History:  The patient  reports that she quit smoking about 14 years ago. Her smoking use included cigarettes. She has a 30.00 pack-year smoking history. she has never used smokeless tobacco. She reports that she does not drink alcohol or use drugs.   Family History:  The patient's family history includes Colon polyps in her daughter; Diabetes in her mother; Heart disease in her father.    ROS:  Please see the history of present illness.   Otherwise, review of systems are positive for none.   All other systems are reviewed and negative.    PHYSICAL EXAM: VS:  BP (!) 88/42 (BP Location: Right Arm, Patient Position: Sitting, Cuff Size: Normal)   Pulse 70   Ht _0  (1.6 m)   Wt 169 lb (76.7 kg)   BMI 29.94 kg/m  , BMI Body mass index is 29.94 kg/m. GEN: Well nourished, well developed, in no acute distress  HEENT: normal  Neck: no JVD,  or masses.  Bilateral carotid bruits. Cardiac: RRR; no rubs, or gallops,no edema . 2/6 systolic ejection murmur in the aortic area. Respiratory:  clear to auscultation bilaterally, normal work of breathing GI: soft, nontender, nondistended, + BS MS: no deformity or atrophy  Skin: warm and dry, no rash Neuro:  Strength and sensation are intact Psych: euthymic mood, full affect  EKG:  EKG is ordered today. EKG showed normal sinus rhythm with nonspecific T wave changes.    Recent Labs: 02/09/2017: ALT 24 02/14/2017: BUN 35; Creatinine, Ser 2.01; Hemoglobin 8.2; Platelets 129; Potassium 4.9; Sodium 132    Lipid Panel No results found for: CHOL, TRIG, HDL, CHOLHDL, VLDL, LDLCALC, LDLDIRECT    Wt Readings from Last 3 Encounters:  06/27/17 169 lb (76.7 kg)  04/03/17 171 lb (77.6 kg)  03/30/17 175 lb (79.4 kg)         ASSESSMENT AND PLAN:  1. Peripheral arterial disease status post successful surgical revascularization of bilateral lower extremities.  She is doing well overall.  Given her extensive peripheral arterial disease and coronary artery disease, I think she benefits from low-dose Xarelto.  This was started today at 2.5 mg twice daily.  I discontinued cilostazol.  Continue aspirin indefinitely.  2. Coronary artery disease: She has no chest pain. Stable exertional dyspnea.  3. Essential hypertension: Blood pressure is low today.  Recheck by me showed a blood pressure of 110/50.  I decreased amlodipine to 5 mg daily.  4. Hyperlipidemia: Currently on atorvastatin and fenofibrate.  5. Bilateral carotid stenosis: Moderate bilateral disease.  She is due for a follow-up carotid Doppler which was ordered today.  Disposition:   FU with me in 6 months  Signed,  [name], MD  06/27/2017 10:50 AM    [place]

## 2017-06-29 ENCOUNTER — Ambulatory Visit (INDEPENDENT_AMBULATORY_CARE_PROVIDER_SITE_OTHER): Payer: Medicare Other | Admitting: Family

## 2017-06-29 ENCOUNTER — Ambulatory Visit (HOSPITAL_COMMUNITY)
Admission: RE | Admit: 2017-06-29 | Discharge: 2017-06-29 | Disposition: A | Discharge status: Home / Self Care | Payer: Medicare Other | Source: Ambulatory Visit | Attending: Vascular Surgery | Admitting: Vascular Surgery

## 2017-06-29 ENCOUNTER — Encounter: Payer: Self-pay | Admitting: Family

## 2017-06-29 VITALS — BP 131/67 | HR 72 | Temp 97.8°F | Resp 18 | Wt 170.0 lb

## 2017-06-29 DIAGNOSIS — Z87891 Personal history of nicotine dependence: Secondary | ICD-10-CM

## 2017-06-29 DIAGNOSIS — Z95828 Presence of other vascular implants and grafts: Secondary | ICD-10-CM | POA: Diagnosis not present

## 2017-06-29 DIAGNOSIS — N184 Chronic kidney disease, stage 4 (severe): Secondary | ICD-10-CM | POA: Diagnosis not present

## 2017-06-29 DIAGNOSIS — I70302 Unspecified atherosclerosis of unspecified type of bypass graft(s) of the extremities, left leg: Secondary | ICD-10-CM | POA: Diagnosis not present

## 2017-06-29 DIAGNOSIS — I739 Peripheral vascular disease, unspecified: Secondary | ICD-10-CM | POA: Insufficient documentation

## 2017-06-29 DIAGNOSIS — I779 Disorder of arteries and arterioles, unspecified: Secondary | ICD-10-CM

## 2017-06-29 NOTE — Progress Notes (Signed)
[facility] [place]   CC: Follow up peripheral artery occlusive disease  History of Present Illness [name] is a 61 y.o. female who returns for follow-up today. She underwent left external iliac common femoral profunda endarterectomy with profundoplasty as well as left femoral to below-knee popliteal artery bypass with vein on 11/23/2016.    Most recently she underwent a right femoral to below-knee popliteal bypass with propaten with a distal vein cuff on 02-13-17 by Dr. Oneida Alar  due to her vein previously been harvested for coronary bypass grafting. She has no claudication symptoms on the right side. She also has a small amount of drainage from the right groin.   Ulcers at lateral aspects of both heels are mostly healed.  She is placing dry dressings on these.   Dr. Oneida Alar last evaluated pt on 03-30-17. At that time pt still had lower extremity edema in both legs primarily pretibial with some in the feet. Feet were warm and pink bilaterally. All incisions healing well except for the groins on both sides. Each groin incision had about a 1 cm opening a few millimeters in depth with a small amount of serous drainage. Dr. Oneida Alar slightly opened the left one to provide better drainage. Each heel ulcer was about 2 cm in diameter less than 1 mm in depth with some granulation tissue at the base.  Doing well status post bilateral femoral-popliteal bypass grafts. Groins were slowly healing. The patient was to follow-up with a graft duplex of both legs in 3 months. She was to continue to place a small amount antibiotic ointment on each groin with a dry dressing over this until these are completely healed. Again emphasized to keep any pressure off of her heel ulcers. She was given a prescription for an additional 14 Percocet 5/325. She should not need any further narcotic pain medication after this.   She feels very well, walks a great deal daily, and is very happy that she no  longer has pain in her legs.  Dr. Fletcher Anon had been monitoring her carotid arteries with duplex, pt has bilateral carotid bruits.    Diabetic: Yes, 7.3 A1C on 02-09-17 Tobacco use: former smoker, quit in 2004, smoked x 30 years  Pt meds include: Statin :Yes Betablocker: Yes ASA: Yes Other anticoagulants/antiplatelets: Xarelto 2.5 mg bid prescribed by Dr. Fletcher Anon  Past Medical History:  Diagnosis Date  . Adenomatous polyp of colon   . Anemia   . Arthritis   . Asthma   . Carotid artery disease ([place])   . Carpal tunnel syndrome   . Chronic renal insufficiency    Stage 3 Kidney disease  . Coronary artery disease   . Depression   . Depression with anxiety   . Diabetes mellitus    type II  . Diverticulosis of colon (without mention of hemorrhage)   . GERD (gastroesophageal reflux disease)   . Heart murmur   . History of hiatal hernia   . Hyperlipidemia   . Hypertension   . Hypothyroidism   . Obesity   . PVD (peripheral vascular disease) ([place])   . Thyroid disease    hypo    Social History Social History   Tobacco Use  . Smoking status: Former Smoker    Packs/day: 1.00    Years: 30.00    Pack years: 30.00    Types: Cigarettes    Last attempt to quit: 2004    Years since quitting: 14.9  . Smokeless tobacco: Never Used  Substance Use Topics  . Alcohol use: No    Alcohol/week: 0.0 oz  . Drug use: No    Family History Family History  Problem Relation Age of Onset  . Diabetes Mother   . Heart disease Father   . Colon polyps Daughter   . Colon cancer Neg Hx   . Esophageal cancer Neg Hx   . Stomach cancer Neg Hx   . Rectal cancer Neg Hx     Past Surgical History:  Procedure Laterality Date  . ABDOMINAL AORTAGRAM N/A 12/18/2013   Procedure: ABDOMINAL [name];  Surgeon: Wellington Hampshire, MD;  Location: [facility];  Service: Cardiovascular;  Laterality: N/A;  . ABDOMINAL AORTOGRAM W/LOWER EXTREMITY N/A 11/16/2016   Procedure: Abdominal Aortogram w/Lower Extremity;   Surgeon: Wellington Hampshire, MD;  Location: [facility];  Service: Cardiovascular;  Laterality: N/A;  . ABDOMINAL AORTOGRAM W/LOWER EXTREMITY N/A 01/25/2017   Procedure: Abdominal Aortogram w/Lower Extremity;  Surgeon: Wellington Hampshire, MD;  Location: [facility];  Service: Cardiovascular;  Laterality: N/A;  only completed Lower Extremity  . ABDOMINAL HYSTERECTOMY    . APPENDECTOMY    . APPLICATION OF WOUND VAC Right 02/13/2017   Procedure: APPLICATION OF INCISIONAL WOUND VAC RIGHT GROIN;  Surgeon: Elam Dutch, MD;  Location: [facility];  Service: Vascular;  Laterality: Right;  . CHOLECYSTECTOMY    . CORONARY ARTERY BYPASS GRAFT  2004   x4  . ENDARTERECTOMY FEMORAL Left 11/23/2016   Procedure: ENDARTERECTOMY OF LEFT EXTERNAL COMMON FEMORAL ARTERY WITH EXTENDED LEFT PROFUNDOPLASTY;  Surgeon: Elam Dutch, MD;  Location: [facility];  Service: Vascular;  Laterality: Left;  . ENDARTERECTOMY FEMORAL Right 02/13/2017   Procedure: ENDARTERECTOMY RIGHT FEMORAL ARTERY WITH PROFUNDAPLASTY;  Surgeon: Elam Dutch, MD;  Location: [facility];  Service: Vascular;  Laterality: Right;  . FEMORAL-POPLITEAL BYPASS GRAFT Left 11/23/2016   Procedure: LEFT FEMORAL-BELOW KNEE POPLITEAL ARTERY BYPASS;  Surgeon: Elam Dutch, MD;  Location: [facility];  Service: Vascular;  Laterality: Left;  . FEMORAL-POPLITEAL BYPASS GRAFT Right 02/13/2017   Procedure: BYPASS GRAFT RIGHT FEMORAL-BELOW KNEE POPLITEAL ARTERY;  Surgeon: Elam Dutch, MD;  Location: [facility];  Service: Vascular;  Laterality: Right;  . KNEE SURGERY Right 2013   arthroscopy  . PATCH ANGIOPLASTY Right 02/13/2017   Procedure: VEIN PATCH ANGIOPLASTY RIGHT POPLITEAL ARTERY AND HEMASHIELD PATCH ANGIOPLASTY OF RIGHT FEMORAL ARTERY;  Surgeon: Elam Dutch, MD;  Location: [facility];  Service: Vascular;  Laterality: Right;    No Known Allergies  Current Outpatient Medications  Medication Sig Dispense Refill  . albuterol (PROVENTIL HFA;VENTOLIN HFA) 108  (90 BASE) MCG/ACT inhaler Inhale 2 puffs into the lungs every 6 (six) hours as needed for wheezing or shortness of breath.     . ALPRAZolam (XANAX) 1 MG tablet Take 1 mg by mouth 2 (two) times daily as needed for anxiety.     Marland Kitchen amLODipine (NORVASC) 5 MG tablet Take 1 tablet (5 mg total) by mouth at bedtime. 90 tablet 3  . aspirin EC 81 MG tablet Take 81 mg by mouth at bedtime.    Marland Kitchen atorvastatin (LIPITOR) 80 MG tablet Take 80 mg by mouth every morning.     . B-D INS SYR ULTRAFINE 1CC/31G 31G X 5/16" 1 ML MISC USE NEW NEEDLE FOR MEAL TIME 3 TIMES A DAY AND LONG ACTING INSULIN TWICE A DAY  11  . fenofibrate micronized (LOFIBRA) 200 MG capsule Take 200 mg by mouth at bedtime.     Marland Kitchen  ferrous sulfate 325 (65 FE) MG tablet Take 325 mg by mouth 3 (three) times daily.  3  . furosemide (LASIX) 40 MG tablet Take 40 mg by mouth 2 (two) times daily.     . Insulin Glargine (BASAGLAR KWIKPEN) 100 UNIT/ML SOPN Inject 20-60 Units into the skin 2 (two) times daily before a meal. Sliding Scale  3  . levothyroxine (SYNTHROID, LEVOTHROID) 175 MCG tablet Take 175 mcg by mouth daily before breakfast.     . LYRICA 75 MG capsule Take 75 mg by mouth at bedtime as needed for pain.  1  . metoprolol (LOPRESSOR) 100 MG tablet Take 100 mg by mouth 2 (two) times daily. Takes (1) 100 mg tab and (1) 50 mg tab together to equal 150 mg    . metoprolol (LOPRESSOR) 50 MG tablet Take 50 mg by mouth 2 (two) times daily. Takes 100 mg and a 50 mg to equal 150 mg twice daily  6  . NARCAN 4 MG/0.1ML LIQD nasal spray kit Place 1 spray into both nostrils See admin instructions. As needed in case of an overdose  0  . Omega-3 Fatty Acids (FISH OIL) 1000 MG CAPS Take 1,000 mg by mouth 2 (two) times daily.    Marland Kitchen omeprazole (PRILOSEC) 40 MG capsule Take 40 mg by mouth daily.    Glory Rosebush VERIO test strip CHECK SUGAR AT HOME 4-5 TIMES A DAY  11  . oxyCODONE (OXYCONTIN) 15 mg 12 hr tablet Take 15 mg by mouth every 12 (twelve) hours.    Marland Kitchen  oxyCODONE-acetaminophen (PERCOCET/ROXICET) 5-325 MG tablet Take 1 tablet by mouth every 8 (eight) hours as needed for severe pain. 14 tablet 0  . PARoxetine (PAXIL) 40 MG tablet Take 40 mg by mouth at bedtime.     . promethazine (PHENERGAN) 25 MG tablet Take 25 mg by mouth daily as needed for nausea or vomiting.   6  . rivaroxaban (XARELTO) 2.5 MG TABS tablet Take 1 tablet (2.5 mg total) by mouth 2 (two) times daily. 60 tablet 6  . sertraline (ZOLOFT) 100 MG tablet Take 100 mg by mouth daily.    . Vitamin D, Ergocalciferol, (DRISDOL) 50000 UNITS CAPS capsule Take 50,000 Units by mouth once a week. mondays  1  . VOLTAREN 1 % GEL APPLY TO AFFECTED AREA 4 GRAMS 3 TIMES DAILY AS NEEDED  1   No current facility-administered medications for this visit.     ROS: See HPI for pertinent positives and negatives.   Physical Examination  Vitals:   06/29/17 1538 06/29/17 1541  BP: 135/64 131/67  Pulse: 72   Resp: 18   Temp: 97.8 F (36.6 C)   TempSrc: Oral   SpO2: 95%   Weight: 170 lb (77.1 kg)    Body mass index is 30.11 kg/m.  General: A&O x 3, WDWN, obese female. Gait: normal Eyes: PERRLA. Pulmonary: Respirations are non labored, CTAB, good air movement in all fields.  Cardiac: regular rhythm, no detected murmur.         Carotid Bruits Right Left   Positive Positive   Radial pulses are 1+ palpable bilaterally   Adominal aortic pulse is not palpable                         VASCULAR EXAM: Extremities without ischemic changes, without Gangrene; without open wounds. Bilateral heel ulcers are mostly granulated and contracted, healing well. Bilateral groin incisions have completely healed.  LE Pulses Right Left       FEMORAL  2+ palpable  2+ palpable        POPLITEAL  2+ palpable   1+ palpable       POSTERIOR TIBIAL  2+ palpable   1+ palpable        DORSALIS PEDIS       ANTERIOR TIBIAL 2+ palpable  1+ palpable    Abdomen: soft, NT, no palpable masses. Skin: no rashes, no ulcers noted. Musculoskeletal: no muscle wasting or atrophy.  Neurologic: A&O X 3; Appropriate Affect ; SENSATION: normal; MOTOR FUNCTION:  moving all extremities equally, motor strength 5/5 throughout. Speech is fluent/normal, voice is slightly husky. CN 2-12 intact.    ASSESSMENT: STEFANEE MCKELL is a 61 y.o. female is s/p left external iliac common femoral profunda endarterectomy with profundoplasty as well as left femoral to below-knee popliteal artery bypass with vein on 11/23/2016.  She is also s/p right femoral to below-knee popliteal bypass with propaten with a distal vein cuff on 02-13-17.   She is very happy that she can walk as far as she wants with no pain in her legs. She is walking a great deal daily, and is losing weight, improving her DM.  Both groin incisions have healed completely, both heel ulcers have almost healed over, have contracted and granulated well.   Bilateral carotid bruits, no hx of stroke or TIA, Dr. Fletcher Anon has been monitoring her carotid arteries by duplex.   Her last serum creatine result on file was 2.01 on 02-14-17, eGFR of 25, stage 4 CKD.  She is being followed by [facility], states she has an appointment with them in January 2019.  Would request a CTA with bilateral LE run off if her renal function was normal.   DATA  Bilateral LE Arterial Duplex (06/29/17); Right fem-pop bypass graft with 50-70% stenosis (248 cm/s at inflow, 225 cm/s at outflow) Left CFA to below knee popliteal bypass graft with 50-74% stenosis at inflow (389 cm/s, at proximal anastomosis is 318 cm/s) No prior exam on the right. No change on the left compared to duplex on 12-30-16.   No ABI's today. On 12-30-16: Right: 0.39, Left: 1.02      PLAN:  Continue extensive walking program.  Based on the patient's vascular studies and examination, and after discussing with Dr.  Oneida Alar, pt will return in 3 months with bilateral LE arterial duplex and ABI's, see Dr. Oneida Alar.   I advised her to notify us if she develops concerns re the circulation in her feet or legs.   I discussed in depth with the patient the nature of atherosclerosis, and emphasized the importance of maximal medical management including strict control of blood pressure, blood glucose, and lipid levels, obtaining regular exercise, and continued cessation of smoking.  The patient is aware that without maximal medical management the underlying atherosclerotic disease process will progress, limiting the benefit of any interventions.  The patient was given information about PAD including signs, symptoms, treatment, what symptoms should prompt the patient to seek immediate medical care, and risk reduction measures to take.  Clemon Chambers, RN, MSN, FNP-C [facility] Phone: [phone]  Clinic MD: Oneida Alar  06/29/17 3:42 PM

## 2017-06-29 NOTE — Patient Instructions (Signed)

## 2017-06-30 NOTE — Addendum Note (Signed)
Addended by: Lianne Cure A on: 06/30/2017 02:26 PM   Modules accepted: Orders

## 2017-07-12 ENCOUNTER — Ambulatory Visit: Payer: Medicare Other | Admitting: Physical Therapy

## 2017-07-14 ENCOUNTER — Encounter (HOSPITAL_COMMUNITY): Payer: Self-pay

## 2017-07-18 ENCOUNTER — Encounter: Payer: Self-pay | Admitting: Physical Therapy

## 2017-07-20 ENCOUNTER — Encounter (HOSPITAL_COMMUNITY): Payer: Medicare Other

## 2017-07-25 ENCOUNTER — Ambulatory Visit: Payer: Medicare Other | Admitting: Physical Therapy

## 2017-08-01 ENCOUNTER — Encounter (HOSPITAL_COMMUNITY): Payer: Self-pay | Admitting: Emergency Medicine

## 2017-08-01 ENCOUNTER — Emergency Department (HOSPITAL_COMMUNITY): Payer: Medicare Other

## 2017-08-01 ENCOUNTER — Inpatient Hospital Stay (HOSPITAL_COMMUNITY)
Admission: EM | Admit: 2017-08-01 | Discharge: 2017-08-03 | DRG: 378 | Disposition: A | Discharge status: Home / Self Care | Payer: Medicare Other | Attending: Family Medicine | Admitting: Family Medicine

## 2017-08-01 ENCOUNTER — Inpatient Hospital Stay (HOSPITAL_COMMUNITY): Payer: Medicare Other

## 2017-08-01 ENCOUNTER — Other Ambulatory Visit: Payer: Self-pay

## 2017-08-01 DIAGNOSIS — I70209 Unspecified atherosclerosis of native arteries of extremities, unspecified extremity: Secondary | ICD-10-CM | POA: Diagnosis present

## 2017-08-01 DIAGNOSIS — K2970 Gastritis, unspecified, without bleeding: Secondary | ICD-10-CM | POA: Diagnosis present

## 2017-08-01 DIAGNOSIS — E785 Hyperlipidemia, unspecified: Secondary | ICD-10-CM | POA: Diagnosis present

## 2017-08-01 DIAGNOSIS — R1032 Left lower quadrant pain: Secondary | ICD-10-CM

## 2017-08-01 DIAGNOSIS — K5730 Diverticulosis of large intestine without perforation or abscess without bleeding: Secondary | ICD-10-CM | POA: Diagnosis present

## 2017-08-01 DIAGNOSIS — Z7901 Long term (current) use of anticoagulants: Secondary | ICD-10-CM

## 2017-08-01 DIAGNOSIS — E875 Hyperkalemia: Secondary | ICD-10-CM | POA: Diagnosis present

## 2017-08-01 DIAGNOSIS — J449 Chronic obstructive pulmonary disease, unspecified: Secondary | ICD-10-CM | POA: Diagnosis present

## 2017-08-01 DIAGNOSIS — N183 Chronic kidney disease, stage 3 (moderate): Secondary | ICD-10-CM | POA: Diagnosis not present

## 2017-08-01 DIAGNOSIS — D509 Iron deficiency anemia, unspecified: Secondary | ICD-10-CM | POA: Diagnosis present

## 2017-08-01 DIAGNOSIS — K922 Gastrointestinal hemorrhage, unspecified: Secondary | ICD-10-CM | POA: Diagnosis present

## 2017-08-01 DIAGNOSIS — E1121 Type 2 diabetes mellitus with diabetic nephropathy: Secondary | ICD-10-CM | POA: Diagnosis not present

## 2017-08-01 DIAGNOSIS — Z794 Long term (current) use of insulin: Secondary | ICD-10-CM

## 2017-08-01 DIAGNOSIS — R197 Diarrhea, unspecified: Secondary | ICD-10-CM

## 2017-08-01 DIAGNOSIS — Z79890 Hormone replacement therapy (postmenopausal): Secondary | ICD-10-CM

## 2017-08-01 DIAGNOSIS — E1151 Type 2 diabetes mellitus with diabetic peripheral angiopathy without gangrene: Secondary | ICD-10-CM | POA: Diagnosis present

## 2017-08-01 DIAGNOSIS — I959 Hypotension, unspecified: Secondary | ICD-10-CM | POA: Diagnosis not present

## 2017-08-01 DIAGNOSIS — D539 Nutritional anemia, unspecified: Secondary | ICD-10-CM

## 2017-08-01 DIAGNOSIS — I10 Essential (primary) hypertension: Secondary | ICD-10-CM | POA: Diagnosis not present

## 2017-08-01 DIAGNOSIS — Z86010 Personal history of colonic polyps: Secondary | ICD-10-CM

## 2017-08-01 DIAGNOSIS — Z951 Presence of aortocoronary bypass graft: Secondary | ICD-10-CM

## 2017-08-01 DIAGNOSIS — D62 Acute posthemorrhagic anemia: Secondary | ICD-10-CM | POA: Diagnosis present

## 2017-08-01 DIAGNOSIS — F341 Dysthymic disorder: Secondary | ICD-10-CM | POA: Diagnosis not present

## 2017-08-01 DIAGNOSIS — K449 Diaphragmatic hernia without obstruction or gangrene: Secondary | ICD-10-CM | POA: Diagnosis present

## 2017-08-01 DIAGNOSIS — I739 Peripheral vascular disease, unspecified: Secondary | ICD-10-CM | POA: Diagnosis present

## 2017-08-01 DIAGNOSIS — D631 Anemia in chronic kidney disease: Secondary | ICD-10-CM | POA: Diagnosis present

## 2017-08-01 DIAGNOSIS — Z87891 Personal history of nicotine dependence: Secondary | ICD-10-CM

## 2017-08-01 DIAGNOSIS — Z833 Family history of diabetes mellitus: Secondary | ICD-10-CM

## 2017-08-01 DIAGNOSIS — K219 Gastro-esophageal reflux disease without esophagitis: Secondary | ICD-10-CM | POA: Diagnosis present

## 2017-08-01 DIAGNOSIS — E669 Obesity, unspecified: Secondary | ICD-10-CM | POA: Diagnosis present

## 2017-08-01 DIAGNOSIS — D6959 Other secondary thrombocytopenia: Secondary | ICD-10-CM | POA: Diagnosis present

## 2017-08-01 DIAGNOSIS — Z79891 Long term (current) use of opiate analgesic: Secondary | ICD-10-CM

## 2017-08-01 DIAGNOSIS — E1122 Type 2 diabetes mellitus with diabetic chronic kidney disease: Secondary | ICD-10-CM | POA: Diagnosis present

## 2017-08-01 DIAGNOSIS — F418 Other specified anxiety disorders: Secondary | ICD-10-CM | POA: Diagnosis present

## 2017-08-01 DIAGNOSIS — Z8249 Family history of ischemic heart disease and other diseases of the circulatory system: Secondary | ICD-10-CM

## 2017-08-01 DIAGNOSIS — E039 Hypothyroidism, unspecified: Secondary | ICD-10-CM | POA: Diagnosis present

## 2017-08-01 DIAGNOSIS — Z6829 Body mass index (BMI) 29.0-29.9, adult: Secondary | ICD-10-CM

## 2017-08-01 DIAGNOSIS — G8929 Other chronic pain: Secondary | ICD-10-CM | POA: Diagnosis present

## 2017-08-01 DIAGNOSIS — R011 Cardiac murmur, unspecified: Secondary | ICD-10-CM | POA: Diagnosis present

## 2017-08-01 DIAGNOSIS — I129 Hypertensive chronic kidney disease with stage 1 through stage 4 chronic kidney disease, or unspecified chronic kidney disease: Secondary | ICD-10-CM | POA: Diagnosis present

## 2017-08-01 DIAGNOSIS — K921 Melena: Principal | ICD-10-CM | POA: Diagnosis present

## 2017-08-01 DIAGNOSIS — I2510 Atherosclerotic heart disease of native coronary artery without angina pectoris: Secondary | ICD-10-CM | POA: Diagnosis present

## 2017-08-01 DIAGNOSIS — I6523 Occlusion and stenosis of bilateral carotid arteries: Secondary | ICD-10-CM | POA: Diagnosis present

## 2017-08-01 DIAGNOSIS — Z90710 Acquired absence of both cervix and uterus: Secondary | ICD-10-CM

## 2017-08-01 DIAGNOSIS — Z9049 Acquired absence of other specified parts of digestive tract: Secondary | ICD-10-CM

## 2017-08-01 DIAGNOSIS — Z7982 Long term (current) use of aspirin: Secondary | ICD-10-CM

## 2017-08-01 DIAGNOSIS — N184 Chronic kidney disease, stage 4 (severe): Secondary | ICD-10-CM | POA: Diagnosis present

## 2017-08-01 DIAGNOSIS — A09 Infectious gastroenteritis and colitis, unspecified: Secondary | ICD-10-CM | POA: Diagnosis present

## 2017-08-01 DIAGNOSIS — Z8371 Family history of colonic polyps: Secondary | ICD-10-CM

## 2017-08-01 DIAGNOSIS — Z79899 Other long term (current) drug therapy: Secondary | ICD-10-CM

## 2017-08-01 DIAGNOSIS — E119 Type 2 diabetes mellitus without complications: Secondary | ICD-10-CM

## 2017-08-01 DIAGNOSIS — N179 Acute kidney failure, unspecified: Secondary | ICD-10-CM | POA: Diagnosis present

## 2017-08-01 DIAGNOSIS — R933 Abnormal findings on diagnostic imaging of other parts of digestive tract: Secondary | ICD-10-CM

## 2017-08-01 LAB — CBC
HCT: 20.8 % — ABNORMAL LOW (ref 36.0–46.0)
HEMATOCRIT: 26.9 % — AB (ref 36.0–46.0)
HEMOGLOBIN: 8.7 g/dL — AB (ref 12.0–15.0)
Hemoglobin: 6.4 g/dL — CL (ref 12.0–15.0)
MCH: 31.4 pg (ref 26.0–34.0)
MCH: 30.1 pg (ref 26.0–34.0)
MCHC: 30.8 g/dL (ref 30.0–36.0)
MCHC: 32.3 g/dL (ref 30.0–36.0)
MCV: 102 fL — ABNORMAL HIGH (ref 78.0–100.0)
MCV: 93.1 fL (ref 78.0–100.0)
Platelets: 286 10*3/uL (ref 150–400)
Platelets: 144 10*3/uL — ABNORMAL LOW (ref 150–400)
RBC: 2.04 MIL/uL — ABNORMAL LOW (ref 3.87–5.11)
RBC: 2.89 MIL/uL — AB (ref 3.87–5.11)
RDW: 17.7 % — AB (ref 11.5–15.5)
RDW: 16.9 % — ABNORMAL HIGH (ref 11.5–15.5)
WBC: 14 10*3/uL — AB (ref 4.0–10.5)
WBC: 8 10*3/uL (ref 4.0–10.5)

## 2017-08-01 LAB — PREPARE RBC (CROSSMATCH)

## 2017-08-01 LAB — PROTIME-INR
INR: 1.14
PROTHROMBIN TIME: 14.5 s (ref 11.4–15.2)

## 2017-08-01 LAB — COMPREHENSIVE METABOLIC PANEL
ALBUMIN: 2.5 g/dL — AB (ref 3.5–5.0)
ALK PHOS: 104 U/L (ref 38–126)
ALT: 24 U/L (ref 14–54)
AST: 35 U/L (ref 15–41)
Anion gap: 16 — ABNORMAL HIGH (ref 5–15)
BILIRUBIN TOTAL: 1.2 mg/dL (ref 0.3–1.2)
BUN: 56 mg/dL — AB (ref 6–20)
CO2: 16 mmol/L — ABNORMAL LOW (ref 22–32)
Calcium: 8.2 mg/dL — ABNORMAL LOW (ref 8.9–10.3)
Chloride: 104 mmol/L (ref 101–111)
Creatinine, Ser: 2.17 mg/dL — ABNORMAL HIGH (ref 0.44–1.00)
GFR calc Af Amer: 27 mL/min — ABNORMAL LOW (ref >60)
GFR, EST NON AFRICAN AMERICAN: 23 mL/min — AB (ref >60)
GLUCOSE: 276 mg/dL — AB (ref 65–99)
Potassium: 5.9 mmol/L — ABNORMAL HIGH (ref 3.5–5.1)
Sodium: 136 mmol/L (ref 135–145)
TOTAL PROTEIN: 5 g/dL — AB (ref 6.5–8.1)

## 2017-08-01 LAB — POC OCCULT BLOOD, ED: FECAL OCCULT BLD: POSITIVE — AB

## 2017-08-01 LAB — IRON AND TIBC
Iron: 21 ug/dL — ABNORMAL LOW (ref 28–170)
Saturation Ratios: 7 % — ABNORMAL LOW (ref 10.4–31.8)
TIBC: 294 ug/dL (ref 250–450)
UIBC: 273 ug/dL

## 2017-08-01 LAB — RETICULOCYTES
RBC.: 1.9 MIL/uL — AB (ref 3.87–5.11)
RETIC COUNT ABSOLUTE: 304 10*3/uL — AB (ref 19.0–186.0)
RETIC CT PCT: 16 % — AB (ref 0.4–3.1)

## 2017-08-01 LAB — TROPONIN I: Troponin I: <0.03 ng/mL (ref <0.03)

## 2017-08-01 LAB — FOLATE: Folate: 12.2 ng/mL (ref >5.9)

## 2017-08-01 LAB — GLUCOSE, CAPILLARY: GLUCOSE-CAPILLARY: 179 mg/dL — AB (ref 65–99)

## 2017-08-01 LAB — VITAMIN B12: Vitamin B-12: 262 pg/mL (ref 180–914)

## 2017-08-01 LAB — FERRITIN: FERRITIN: 50 ng/mL (ref 11–307)

## 2017-08-01 LAB — CBG MONITORING, ED: Glucose-Capillary: 166 mg/dL — ABNORMAL HIGH (ref 65–99)

## 2017-08-01 MED ORDER — ALPRAZOLAM 0.5 MG PO TABS: 1.0000 mg | ORAL_TABLET | Freq: Two times a day (BID) | ORAL | Status: DC | PRN

## 2017-08-01 MED ORDER — INSULIN GLARGINE 100 UNIT/ML ~~LOC~~ SOLN
20.0000 [IU] | Freq: Every day | SUBCUTANEOUS | Status: DC
  Administered 2017-08-02: 20 [IU] via SUBCUTANEOUS
  Filled 2017-08-01 (×2): qty 0.2

## 2017-08-01 MED ORDER — METOPROLOL TARTRATE 50 MG PO TABS
150.0000 mg | ORAL_TABLET | Freq: Every day | ORAL | Status: DC
  Administered 2017-08-01: 150 mg via ORAL
  Filled 2017-08-01 (×2): qty 1

## 2017-08-01 MED ORDER — OXYCODONE-ACETAMINOPHEN 5-325 MG PO TABS
1.0000 | ORAL_TABLET | Freq: Three times a day (TID) | ORAL | Status: DC | PRN
  Administered 2017-08-02: 1 via ORAL
  Filled 2017-08-01: qty 1

## 2017-08-01 MED ORDER — ATORVASTATIN CALCIUM 80 MG PO TABS
80.0000 mg | ORAL_TABLET | Freq: Every day | ORAL | Status: DC
  Administered 2017-08-02 – 2017-08-03 (×2): 80 mg via ORAL
  Filled 2017-08-01 (×3): qty 1

## 2017-08-01 MED ORDER — METOPROLOL TARTRATE 25 MG PO TABS: 100.0000 mg | ORAL_TABLET | ORAL | Status: DC

## 2017-08-01 MED ORDER — PROMETHAZINE HCL 25 MG PO TABS: 25.0000 mg | ORAL_TABLET | Freq: Every day | ORAL | Status: DC | PRN

## 2017-08-01 MED ORDER — INSULIN ASPART 100 UNIT/ML ~~LOC~~ SOLN
0.0000 [IU] | Freq: Three times a day (TID) | SUBCUTANEOUS | Status: DC
  Administered 2017-08-02: 3 [IU] via SUBCUTANEOUS
  Administered 2017-08-02: 2 [IU] via SUBCUTANEOUS
  Administered 2017-08-02: 11 [IU] via SUBCUTANEOUS

## 2017-08-01 MED ORDER — PANTOPRAZOLE SODIUM 40 MG IV SOLR
80.0000 mg | Freq: Once | INTRAVENOUS | Status: AC
  Administered 2017-08-01: 80 mg via INTRAVENOUS
  Filled 2017-08-01: qty 80

## 2017-08-01 MED ORDER — OXYCODONE HCL ER 15 MG PO T12A
15.0000 mg | EXTENDED_RELEASE_TABLET | Freq: Two times a day (BID) | ORAL | Status: DC
  Administered 2017-08-01 – 2017-08-03 (×3): 15 mg via ORAL
  Filled 2017-08-01 (×3): qty 1

## 2017-08-01 MED ORDER — LEVOTHYROXINE SODIUM 75 MCG PO TABS
175.0000 ug | ORAL_TABLET | Freq: Every day | ORAL | Status: DC
  Administered 2017-08-02 – 2017-08-03 (×2): 175 ug via ORAL
  Filled 2017-08-01 (×3): qty 1

## 2017-08-01 MED ORDER — IOPAMIDOL (ISOVUE-300) INJECTION 61%
INTRAVENOUS | Status: AC
  Filled 2017-08-01: qty 30

## 2017-08-01 MED ORDER — SODIUM CHLORIDE 0.9 % IV SOLN
8.0000 mg/h | INTRAVENOUS | Status: DC
  Administered 2017-08-01 (×2): 8 mg/h via INTRAVENOUS
  Filled 2017-08-01 (×3): qty 80

## 2017-08-01 MED ORDER — METOPROLOL TARTRATE 100 MG PO TABS
100.0000 mg | ORAL_TABLET | Freq: Every morning | ORAL | Status: DC
  Administered 2017-08-03: 100 mg via ORAL
  Filled 2017-08-01: qty 1

## 2017-08-01 MED ORDER — ALBUTEROL SULFATE (2.5 MG/3ML) 0.083% IN NEBU: 3.0000 mL | INHALATION_SOLUTION | Freq: Four times a day (QID) | RESPIRATORY_TRACT | Status: DC | PRN

## 2017-08-01 MED ORDER — SODIUM CHLORIDE 0.9 % IV BOLUS (SEPSIS)
1000.0000 mL | Freq: Once | INTRAVENOUS | Status: AC
  Administered 2017-08-01: 1000 mL via INTRAVENOUS

## 2017-08-01 MED ORDER — PAROXETINE HCL 20 MG PO TABS
40.0000 mg | ORAL_TABLET | Freq: Every day | ORAL | Status: DC
  Administered 2017-08-01 – 2017-08-02 (×2): 40 mg via ORAL
  Filled 2017-08-01 (×3): qty 2

## 2017-08-01 NOTE — ED Triage Notes (Addendum)
Pt reports dark, tarry stools x 1 week, states, "at least 5 times a day." Pt reports fatigue, SOB, frequent falls.  Pt reports PCP called to say Hemoglobin has dropped 2 points 1 week. Pt denies CP, n/v, LOC.  Pt appears pale at this time.

## 2017-08-01 NOTE — Consult Note (Signed)
[facility] Consult: 2:22 PM 08/01/2017  LOS: 0 days    Referring Provider: Dr Gilford Raid in ED  Primary Care Physician:  Nicholes Rough, PA-C Primary Gastroenterologist:  Dr. Scarlette Shorts    Reason for Consultation: GI bleed, anemia.   HPI: [name] is a 62 y.o. female.  Hx CAD.  S/P CABG 2004.  Echocardiogram 11/2016 with LVEF 55-60%. COPD. CKD.  Peripheral vascular disease, claudication. S/p 11/2016 left external iliac common femoral profunda endarterectomy with profundoplasty and left femoral to below-knee popliteal artery bypass.  S/p 02/2017 right fem-pop bypass Carotid stenosis and bruits bil. Patient was switched from Pletal to Xarelto several weeks ago by her vascular surgeon.Marland Kitchen    Hx adenomatous colon polyps deficiency anemia.Marland Kitchen 05/2011 colonoscopy for polyp surveillance.  Diminutive hyperplastic polyp removed. 11/2011 EGD.  For IDA.  Normal study. 11/2011 capsule endoscopy for IDA.  One small erosion, otherwise negative. 07/2016 colonoscopy for adenomatous polyp surveillance.  Three, 4-6 mm polyps in sigmoid and transverse colon resected.  OK to resume Pletal.  Path: one transverse polyp was tubular adenoma.  Sigmoid and 2nd transverse polyp showed benign polypoid mucosa with changes of trauma/prolapse but no adenomatous change.    10-14 days of burgundy stools, multiple episodes daily.  Progressively weaker to the point where she has fallen because she cannot stand up.  She has not had syncope.  She has exertional dyspnea as well as exertional chest pressure.  Appetite diminished but no nausea or vomiting.  In addition to the Xarelto, she takes 81 mg aspirin but no other NSAIDs.  She also takes omeprazole daily.  Weight is stable.  She did receive transfusion with PRBCs this summer at the time of bypass surgery.   She takes 3 tablets of iron all at the same time, once per day.  Received parenteral iron infusions x 2 in 03/2017 under the supervision of Dr. Mercy Moore.  She does not recall previous treatments with erythropoietin.   She went to her PMD yesterday complaining of the above symptoms and had lab work.  She was called today and advised to come to the emergency department because she was anemic. Hgb 6.4, MCV 102 this compares with measurement of 8.2, 86 on 02/14/17.  GFR 27 which corresponds with stage IV CKD.  Potassium elevated at 5.9 and glucose elevated at 276.  Stool is FOBT positive, melenic/burgundy in color per exam by the ED physician.   Past Medical History:  Diagnosis Date  . Adenomatous polyp of colon   . Anemia   . Arthritis   . Asthma   . Carotid artery disease ([place])   . Carpal tunnel syndrome   . Chronic renal insufficiency    Stage 3 Kidney disease  . Coronary artery disease   . Depression   . Depression with anxiety   . Diabetes mellitus    type II  . Diverticulosis of colon (without mention of hemorrhage)   . GERD (gastroesophageal reflux disease)   . Heart murmur   . History of hiatal hernia   . Hyperlipidemia   .  Hypertension   . Hypothyroidism   . Obesity   . PVD (peripheral vascular disease) ([place])   . Thyroid disease    hypo    Past Surgical History:  Procedure Laterality Date  . ABDOMINAL AORTAGRAM N/A 12/18/2013   Procedure: ABDOMINAL [name];  Surgeon: Wellington Hampshire, MD;  Location: [facility] CATH [facility];  Service: Cardiovascular;  Laterality: N/A;  . ABDOMINAL AORTOGRAM W/LOWER EXTREMITY N/A 11/16/2016   Procedure: Abdominal Aortogram w/Lower Extremity;  Surgeon: Wellington Hampshire, MD;  Location: [facility] CV [facility];  Service: Cardiovascular;  Laterality: N/A;  . ABDOMINAL AORTOGRAM W/LOWER EXTREMITY N/A 01/25/2017   Procedure: Abdominal Aortogram w/Lower Extremity;  Surgeon: Wellington Hampshire, MD;  Location: [place] CV [facility];  Service: Cardiovascular;  Laterality:  N/A;  only completed Lower Extremity  . ABDOMINAL HYSTERECTOMY    . APPENDECTOMY    . APPLICATION OF WOUND VAC Right 02/13/2017   Procedure: APPLICATION OF INCISIONAL WOUND VAC RIGHT GROIN;  Surgeon: Elam Dutch, MD;  Location: [facility];  Service: Vascular;  Laterality: Right;  . CHOLECYSTECTOMY    . CORONARY ARTERY BYPASS GRAFT  2004   x4  . ENDARTERECTOMY FEMORAL Left 11/23/2016   Procedure: ENDARTERECTOMY OF LEFT EXTERNAL COMMON FEMORAL ARTERY WITH EXTENDED LEFT PROFUNDOPLASTY;  Surgeon: Elam Dutch, MD;  Location: [facility];  Service: Vascular;  Laterality: Left;  . ENDARTERECTOMY FEMORAL Right 02/13/2017   Procedure: ENDARTERECTOMY RIGHT FEMORAL ARTERY WITH PROFUNDAPLASTY;  Surgeon: Elam Dutch, MD;  Location: [facility];  Service: Vascular;  Laterality: Right;  . FEMORAL-POPLITEAL BYPASS GRAFT Left 11/23/2016   Procedure: LEFT FEMORAL-BELOW KNEE POPLITEAL ARTERY BYPASS;  Surgeon: Elam Dutch, MD;  Location: [facility];  Service: Vascular;  Laterality: Left;  . FEMORAL-POPLITEAL BYPASS GRAFT Right 02/13/2017   Procedure: BYPASS GRAFT RIGHT FEMORAL-BELOW KNEE POPLITEAL ARTERY;  Surgeon: Elam Dutch, MD;  Location: [facility];  Service: Vascular;  Laterality: Right;  . KNEE SURGERY Right 2013   arthroscopy  . PATCH ANGIOPLASTY Right 02/13/2017   Procedure: VEIN PATCH ANGIOPLASTY RIGHT POPLITEAL ARTERY AND HEMASHIELD PATCH ANGIOPLASTY OF RIGHT FEMORAL ARTERY;  Surgeon: Elam Dutch, MD;  Location: [place];  Service: Vascular;  Laterality: Right;    Prior to Admission medications   Medication Sig Start Date End Date Taking? Authorizing Provider  albuterol (PROVENTIL HFA;VENTOLIN HFA) 108 (90 BASE) MCG/ACT inhaler Inhale 2 puffs into the lungs every 6 (six) hours as needed for wheezing or shortness of breath.    Yes [provider]  ALPRAZolam Duanne Moron) 1 MG tablet Take 1 mg by mouth 2 (two) times daily as needed for anxiety.  05/11/11  Yes [provider]  amLODipine  (NORVASC) 5 MG tablet Take 1 tablet (5 mg total) by mouth at bedtime. 06/27/17  Yes Wellington Hampshire, MD  aspirin EC 81 MG tablet Take 81 mg by mouth at bedtime.   Yes [provider]  atorvastatin (LIPITOR) 80 MG tablet Take 80 mg by mouth every morning.    Yes [provider]  calcitRIOL (ROCALTROL) 0.25 MCG capsule Take 0.25 mcg by mouth 3 (three) times a week. Monday, Wednesday, Friday 07/02/17  Yes [provider]  cetirizine (ZYRTEC) 10 MG tablet Take 10 mg by mouth daily. 06/30/17  Yes [provider]  fenofibrate micronized (LOFIBRA) 200 MG capsule Take 200 mg by mouth at bedtime.    Yes [provider]  ferrous sulfate 325 (65 FE) MG tablet Take 325 mg by mouth 3 (three) times daily.  05/10/15  Yes [provider]  furosemide (LASIX) 40 MG tablet Take 40 mg by mouth 2 (two) times daily.  05/08/11  Yes [provider]  levothyroxine (SYNTHROID, LEVOTHROID) 175 MCG tablet Take 175 mcg by mouth daily before breakfast.  08/09/15  Yes [provider]  metoprolol (LOPRESSOR) 50 MG tablet Take 50 mg by mouth 2 (two) times daily. Takes 100 mg and a 50 mg to equal 150 mg twice daily 11/13/16  Yes [provider]  NARCAN 4 MG/0.1ML LIQD nasal spray kit Place 1 spray into both nostrils See admin instructions. As needed in case of an overdose 01/27/17  Yes [provider]  Omega-3 Fatty Acids (FISH OIL) 1000 MG CAPS Take 1,000 mg by mouth 2 (two) times daily.   Yes [provider]  omeprazole (PRILOSEC) 40 MG capsule Take 40 mg by mouth daily.   Yes [provider]  oxyCODONE (OXYCONTIN) 15 mg 12 hr tablet Take 15 mg by mouth every 12 (twelve) hours.   Yes [provider]  oxyCODONE-acetaminophen (PERCOCET/ROXICET) 5-325 MG tablet Take 1 tablet by mouth every 8 (eight) hours as needed for severe pain. 03/30/17  Yes Fields, Jessy Oto, MD  PARoxetine (PAXIL) 40 MG tablet Take 40 mg by mouth at  bedtime.    Yes [provider]  promethazine (PHENERGAN) 25 MG tablet Take 25 mg by mouth daily as needed for nausea or vomiting.  08/06/16  Yes [provider]  rivaroxaban (XARELTO) 2.5 MG TABS tablet Take 1 tablet (2.5 mg total) by mouth 2 (two) times daily. 06/27/17  Yes Wellington Hampshire, MD  sertraline (ZOLOFT) 100 MG tablet Take 100 mg by mouth daily. 03/30/15  Yes [provider]  Vitamin D, Ergocalciferol, (DRISDOL) 50000 UNITS CAPS capsule Take 50,000 Units by mouth once a week. mondays 10/10/14  Yes [provider]  VOLTAREN 1 % GEL APPLY TO AFFECTED AREA 4 GRAMS 3 TIMES DAILY AS NEEDED 05/03/17  Yes [provider]  B-D INS SYR ULTRAFINE 1CC/31G 31G X 5/16" 1 ML MISC USE NEW NEEDLE FOR MEAL TIME 3 TIMES A DAY AND LONG ACTING INSULIN TWICE A DAY 05/27/15   [provider]  Insulin Glargine (BASAGLAR KWIKPEN) 100 UNIT/ML SOPN Inject 20-60 Units into the skin 2 (two) times daily before a meal. Sliding Scale 01/26/17   [provider]  ONETOUCH VERIO test strip [place] 4-5 TIMES A DAY 06/08/15   [provider]    Scheduled Meds:  Infusions: . pantoprazole (PROTONIX) IVPB 80 mg (08/01/17 1419)  . pantoprozole (PROTONIX) infusion 8 mg/hr (08/01/17 1419)  . sodium chloride     PRN Meds:    Allergies as of 08/01/2017  . (No Known Allergies)    Family History  Problem Relation Age of Onset  . Diabetes Mother   . Heart disease Father   . Colon polyps Daughter   . Colon cancer Neg Hx   . Esophageal cancer Neg Hx   . Stomach cancer Neg Hx   . Rectal cancer Neg Hx     Social History   Socioeconomic History  . Marital status: Widowed    Spouse name: Not on file  . Number of children: 2  . Years of education: Not on file  . Highest education level: Not on file  Social Needs  . Financial resource strain: Not on file  . Food insecurity - worry: Not on file  . Food insecurity - inability: Not on  file  . Transportation needs - medical: Not on file  . Transportation needs - non-medical: Not on file  Occupational History  . 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 Smoking status: Former Smoker    Packs/day: 1.00    Years: 30.00    Pack years: 30.00    Types: Cigarettes    Last attempt to quit: 2004    Years since quitting: 15.0  . Smokeless tobacco: Never Used  Substance and Sexual Activity  . Alcohol use: No    Alcohol/week: 0.0 oz  . Drug use: No  . Sexual activity: Not on file  Other Topics Concern  . Not on file  Social History Narrative   Daily Caffeine    REVIEW OF SYSTEMS: Constitutional:  Per HPI ENT:  No nose bleeds Pulm:  Per HPI CV:  No palpitations, no LE edema. + Chest pressure as per HPI.   GU:  No hematuria, no frequency GI:  Per HPI Heme: Denies excessive or unusual bleeding or bruising.  Has not sustained any significant bruising associated with her recent falls. Transfusions:  Per HPI Neuro:  No headaches, no peripheral tingling or numbness.  No syncope.   Derm:  No itching, no rash or sores.  Endocrine:  No sweats or chills.  No polyuria or dysuria Immunization: Did not inquire as to recent or past immunizations/vaccinations Travel:  None beyond local counties in last few months.    PHYSICAL EXAM: Vital signs in last 24 hours: Vitals:   08/01/17 1345 08/01/17 1400  BP: (!) 127/32 (!) 139/42  Pulse: 65 68  Resp: 11 19  Temp:    SpO2: 100% 100%   Wt Readings from Last 3 Encounters:  06/29/17 77.1 kg (170 lb)  06/27/17 76.7 kg (169 lb)  04/03/17 77.6 kg (171 lb)    General: Pale, ashen coloring.  Looks chronically unwell Head: No facial asymmetry or swelling.  No signs of head trauma. Eyes: Conjunctiva pale.  No scleral icterus.  EOMI. Ears: Not hard of hearing Nose: No discharge Mouth: Moist, clear, pink oral mucosa.  Tongue midline.  Dentures in place. Neck: No JVD, TMG,  masses. Lungs: Clear bilaterally.  No dyspnea or cough. Heart: RRR.  No MRG.  S1, S2 present. Abdomen: Left-sided tenderness without guarding or rebound.  No HSM, masses, bruits, hernias.  Abdomen is soft and nondistended..   Rectal: Did not repeat the exam.  Stool was burgundy/dark blood without masses on rectal exam. Musc/Skeltl: No joint erythema, swelling or significant deformity. Extremities: No CCE. Neurologic: Fully alert and oriented.  Moves all 4 limbs.  No tremor. Skin: Ashen coloring to the skin.  No bruises or signs of trauma. Tattoos: None observed Nodes: No cervical adenopathy. Psych: cooperative, calm.  Intake/Output from previous day: No intake/output data recorded. Intake/Output this shift: No intake/output data recorded.  LAB RESULTS: Recent Labs    08/01/17 1256  WBC 14.0*  HGB 6.4*  HCT 20.8*  PLT 286   BMET Lab Results  Component Value Date   NA 136 08/01/2017   NA 132 (L) 02/14/2017   NA 140 02/09/2017   K 5.9 (H) 08/01/2017   K 4.9 02/14/2017   K 4.3 02/09/2017   CL 104 08/01/2017   CL 101 02/14/2017   CL 109 02/09/2017   CO2 16 (L) 08/01/2017   CO2 23 02/14/2017   CO2 24 02/09/2017   GLUCOSE 276 (H) 08/01/2017  GLUCOSE 305 (H) 02/14/2017   GLUCOSE 169 (H) 02/09/2017   BUN 56 (H) 08/01/2017   BUN 35 (H) 02/14/2017   BUN 25 (H) 02/09/2017   CREATININE 2.17 (H) 08/01/2017   CREATININE 2.01 (H) 02/14/2017   CREATININE 1.96 (H) 02/13/2017   CALCIUM 8.2 (L) 08/01/2017   CALCIUM 7.2 (L) 02/14/2017   CALCIUM 8.3 (L) 02/09/2017   LFT Recent Labs    08/01/17 1256  PROT 5.0*  ALBUMIN 2.5*  AST 35  ALT 24  ALKPHOS 104  BILITOT 1.2   PT/INR Lab Results  Component Value Date   INR 1.05 02/09/2017   INR 0.96 01/25/2017   INR 1.11 11/22/2016    RADIOLOGY STUDIES: Dg Chest 2 View  Result Date: 08/01/2017 CLINICAL DATA:  Rectal bleeding.  Diarrhea. EXAM: CHEST  2 VIEW COMPARISON:  05/07/2004. FINDINGS: Mediastinum and hilar  structures normal. Prior CABG. Heart size normal. No focal infiltrate. No pleural effusion or pneumothorax. IMPRESSION: No acute cardiopulmonary disease.  Prior CABG.  Heart size normal. Electronically Signed   By: [place]   On: 08/01/2017 14:03     IMPRESSION:   *   GI bleed.  Suspect upper GI bleed vs bleeding from ischemic colitis..  *  Acute on chronic Anemia.  Macrocytic.  Previous history of iron deficiency anemia.  *    Peripheral vascular disease.  Status post bilateral lower extremity bypass, the most recent on the right in 02/2017.  On Xarelto for the last several weeks.  Her last dose of this medication was yesterday, 07/31/17.  *  Type 2 IDDM.  Currently with poorly controlled blood glucose.  *   CKD, stage IV.   PLAN:     *  Non IV contrast CT scan ordered to assess for colitis  *  EGD set for ~ noon tomorrow.  For now continue the Protonix drip which has been initiated.  *  PRBCs x 2 previously ordered. Hopefully we can obtain an anemia profile prior to her starting transfusions. CBC in AM.    *   Patient can have clear liquids today but will need to be n.p.o. after midnight.   Azucena Freed  08/01/2017, 2:22 PM Pager: [phone]     Attending physician's note   I have taken a history, examined the patient and reviewed the chart. I agree with the Advanced Practitioner's note, impression and recommendations. Suspected ischemic colitis, possible UGI source. CT scan today to assess for colitis. If no source noted on CT will proceed with EGD tomorrow. Transfuse to keep Hb > 7. IV fluids. IV PPI for now. Trend CBC.   Lucio Edward, MD [facility] [phone] Mon-Fri 8a-5p [phone] after 5p, weekends, holidays

## 2017-08-01 NOTE — ED Notes (Signed)
Pt reports BM since arrival, states, "It was dark purple with fresh blood." Pt appears pale.

## 2017-08-01 NOTE — ED Provider Notes (Signed)
[facility] Provider Note   CSN: [id number] Arrival date & time: 08/01/17  1141     History   Chief Complaint Chief Complaint  Patient presents with  . Anemia  . GI Bleeding    HPI [name] is a 62 y.o. female.  Pt presents to the ED today with black stool.  She said it's been going on for about 1 week.  She went to her pcp who told her that her hgb has dropped.  Pt said she had another large bm in triage that was "dark purple" with blood.  Pt denies any vomiting.  She has been feeling weak.  She does get CP and SOB with exertion.  Pt does take Xarelto.      Past Medical History:  Diagnosis Date  . Adenomatous polyp of colon   . Anemia   . Arthritis   . Asthma   . Carotid artery disease ([place])   . Carpal tunnel syndrome   . Chronic renal insufficiency    Stage 3 Kidney disease  . Coronary artery disease   . Depression   . Depression with anxiety   . Diabetes mellitus    type II  . Diverticulosis of colon (without mention of hemorrhage)   . GERD (gastroesophageal reflux disease)   . Heart murmur   . History of hiatal hernia   . Hyperlipidemia   . Hypertension   . Hypothyroidism   . Obesity   . PVD (peripheral vascular disease) ([place])   . Thyroid disease    hypo    Patient Active Problem List   Diagnosis Date Noted  . Pressure injury of skin 11/25/2016  . PAD (peripheral artery disease) ([place]) 11/23/2016  . Major depressive disorder, recurrent episode, moderate ([place]) 04/04/2016  . Disorder resulting from impaired renal function 10/27/2009  . Bilateral carotid artery stenosis 10/08/2009  . Essential hypertension 10/02/2008  . GERD 03/11/2008  . COLONIC POLYPS 03/10/2008  . Type II or unspecified type diabetes mellitus without mention of complication, not stated as uncontrolled 03/10/2008  . Hyperlipidemia 03/10/2008  . ANXIETY DEPRESSION 03/10/2008  . Coronary atherosclerosis 03/10/2008  . Peripheral vascular  disease ([place]) 03/10/2008  . DIVERTICULOSIS, COLON 03/10/2008    Past Surgical History:  Procedure Laterality Date  . ABDOMINAL AORTAGRAM N/A 12/18/2013   Procedure: ABDOMINAL [name];  Surgeon: Wellington Hampshire, MD;  Location: [facility];  Service: Cardiovascular;  Laterality: N/A;  . ABDOMINAL AORTOGRAM W/LOWER EXTREMITY N/A 11/16/2016   Procedure: Abdominal Aortogram w/Lower Extremity;  Surgeon: Wellington Hampshire, MD;  Location: [facility];  Service: Cardiovascular;  Laterality: N/A;  . ABDOMINAL AORTOGRAM W/LOWER EXTREMITY N/A 01/25/2017   Procedure: Abdominal Aortogram w/Lower Extremity;  Surgeon: Wellington Hampshire, MD;  Location: [facility];  Service: Cardiovascular;  Laterality: N/A;  only completed Lower Extremity  . ABDOMINAL HYSTERECTOMY    . APPENDECTOMY    . APPLICATION OF WOUND VAC Right 02/13/2017   Procedure: APPLICATION OF INCISIONAL WOUND VAC RIGHT GROIN;  Surgeon: Elam Dutch, MD;  Location: [facility];  Service: Vascular;  Laterality: Right;  . CHOLECYSTECTOMY    . CORONARY ARTERY BYPASS GRAFT  2004   x4  . ENDARTERECTOMY FEMORAL Left 11/23/2016   Procedure: ENDARTERECTOMY OF LEFT EXTERNAL COMMON FEMORAL ARTERY WITH EXTENDED LEFT PROFUNDOPLASTY;  Surgeon: Elam Dutch, MD;  Location: [facility];  Service: Vascular;  Laterality: Left;  . ENDARTERECTOMY FEMORAL Right 02/13/2017   Procedure: ENDARTERECTOMY RIGHT FEMORAL  ARTERY WITH PROFUNDAPLASTY;  Surgeon: Elam Dutch, MD;  Location: [facility];  Service: Vascular;  Laterality: Right;  . FEMORAL-POPLITEAL BYPASS GRAFT Left 11/23/2016   Procedure: LEFT FEMORAL-BELOW KNEE POPLITEAL ARTERY BYPASS;  Surgeon: Elam Dutch, MD;  Location: [facility];  Service: Vascular;  Laterality: Left;  . FEMORAL-POPLITEAL BYPASS GRAFT Right 02/13/2017   Procedure: BYPASS GRAFT RIGHT FEMORAL-BELOW KNEE POPLITEAL ARTERY;  Surgeon: Elam Dutch, MD;  Location: [facility];  Service: Vascular;  Laterality: Right;  . KNEE SURGERY Right  2013   arthroscopy  . PATCH ANGIOPLASTY Right 02/13/2017   Procedure: VEIN PATCH ANGIOPLASTY RIGHT POPLITEAL ARTERY AND HEMASHIELD PATCH ANGIOPLASTY OF RIGHT FEMORAL ARTERY;  Surgeon: Elam Dutch, MD;  Location: [facility] OR;  Service: Vascular;  Laterality: Right;    OB History    No data available       Home Medications    Prior to Admission medications   Medication Sig Start Date End Date Taking? Authorizing Provider  albuterol (PROVENTIL HFA;VENTOLIN HFA) 108 (90 BASE) MCG/ACT inhaler Inhale 2 puffs into the lungs every 6 (six) hours as needed for wheezing or shortness of breath.    Yes [provider]  ALPRAZolam Duanne Moron) 1 MG tablet Take 1 mg by mouth 2 (two) times daily as needed for anxiety.  05/11/11  Yes [provider]  amLODipine (NORVASC) 5 MG tablet Take 1 tablet (5 mg total) by mouth at bedtime. 06/27/17  Yes Wellington Hampshire, MD  aspirin EC 81 MG tablet Take 81 mg by mouth at bedtime.   Yes [provider]  atorvastatin (LIPITOR) 80 MG tablet Take 80 mg by mouth every morning.    Yes [provider]  bismuth subsalicylate (PEPTO BISMOL) 262 MG/15ML suspension Take 30 mLs by mouth every 6 (six) hours as needed for diarrhea or loose stools.   Yes [provider]  calcitRIOL (ROCALTROL) 0.25 MCG capsule Take 0.25 mcg by mouth 3 (three) times a week. Monday, Wednesday, Friday 07/02/17  Yes [provider]  cetirizine (ZYRTEC) 10 MG tablet Take 10 mg by mouth daily. 06/30/17  Yes [provider]  fenofibrate micronized (LOFIBRA) 200 MG capsule Take 200 mg by mouth at bedtime.    Yes [provider]  ferrous sulfate 325 (65 FE) MG tablet Take 325 mg by mouth 3 (three) times daily. 05/10/15  Yes [provider]  furosemide (LASIX) 40 MG tablet Take 40 mg by mouth 2 (two) times daily.  05/08/11  Yes [provider]  levothyroxine (SYNTHROID, LEVOTHROID) 175 MCG tablet Take 175 mcg by mouth daily  before breakfast.  08/09/15  Yes [provider]  loperamide (IMODIUM) 2 MG capsule Take 4 mg by mouth as needed for diarrhea or loose stools.   Yes [provider]  metoprolol (LOPRESSOR) 50 MG tablet Take 100-150 mg by mouth See admin instructions. Takes 100 mg in the morning and 120m in the evening. 11/13/16  Yes [provider]  NARCAN 4 MG/0.1ML LIQD nasal spray kit Place 1 spray into both nostrils See admin instructions. As needed in case of an overdose 01/27/17  Yes [provider]  Omega-3 Fatty Acids (FISH OIL) 1000 MG CAPS Take 1,000 mg by mouth 2 (two) times daily.   Yes [provider]  omeprazole (PRILOSEC) 40 MG capsule Take 40 mg by mouth daily.   Yes [provider]  oxyCODONE (OXYCONTIN) 15 mg 12 hr tablet Take 15 mg by mouth every 12 (twelve) hours.  Yes [provider]  oxyCODONE-acetaminophen (PERCOCET/ROXICET) 5-325 MG tablet Take 1 tablet by mouth every 8 (eight) hours as needed for severe pain. 03/30/17  Yes Fields, Jessy Oto, MD  PARoxetine (PAXIL) 40 MG tablet Take 40 mg by mouth at bedtime.    Yes [provider]  promethazine (PHENERGAN) 25 MG tablet Take 25 mg by mouth daily as needed for nausea or vomiting.  08/06/16  Yes [provider]  rivaroxaban (XARELTO) 2.5 MG TABS tablet Take 1 tablet (2.5 mg total) by mouth 2 (two) times daily. 06/27/17  Yes Wellington Hampshire, MD  sertraline (ZOLOFT) 100 MG tablet Take 100 mg by mouth daily. 03/30/15  Yes [provider]  Vitamin D, Ergocalciferol, (DRISDOL) 50000 UNITS CAPS capsule Take 50,000 Units by mouth once a week. mondays 10/10/14  Yes [provider]  VOLTAREN 1 % GEL APPLY TO AFFECTED AREA 4 GRAMS 3 TIMES DAILY AS NEEDED 05/03/17  Yes [provider]  B-D INS SYR ULTRAFINE 1CC/31G 31G X 5/16" 1 ML MISC USE NEW NEEDLE FOR MEAL TIME 3 TIMES A DAY AND LONG ACTING INSULIN TWICE A DAY 05/27/15   [provider]    Insulin Glargine (BASAGLAR KWIKPEN) 100 UNIT/ML SOPN Inject 20-60 Units into the skin 2 (two) times daily before a meal. Sliding Scale 01/26/17   [provider]  ONETOUCH VERIO test strip [place] 4-5 TIMES A DAY 06/08/15   [provider]    Family History Family History  Problem Relation Age of Onset  . Diabetes Mother   . Heart disease Father   . Colon polyps Daughter   . Colon cancer Neg Hx   . Esophageal cancer Neg Hx   . Stomach cancer Neg Hx   . Rectal cancer Neg Hx     Social History Social History   Tobacco Use  . Smoking status: Former Smoker    Packs/day: 1.00    Years: 30.00    Pack years: 30.00    Types: Cigarettes    Last attempt to quit: 2004    Years since quitting: 15.0  . Smokeless tobacco: Never Used  Substance Use Topics  . Alcohol use: No    Alcohol/week: 0.0 oz  . Drug use: No     Allergies   Patient has no known allergies.   Review of Systems Review of Systems  Gastrointestinal: Positive for abdominal pain and blood in stool.  Neurological: Positive for weakness.  All other systems reviewed and are negative.    Physical Exam Updated Vital Signs BP 119/82   Pulse 65   Temp 97.6 F (36.4 C)   Resp 15   SpO2 100%   Physical Exam  Constitutional: She is oriented to person, place, and time. She appears well-developed. She appears distressed.  HENT:  Head: Normocephalic and atraumatic.  Right Ear: External ear normal.  Left Ear: External ear normal.  Nose: Nose normal.  Mouth/Throat: Oropharynx is clear and moist.  Eyes: Conjunctivae and EOM are normal. Pupils are equal, round, and reactive to light.  Neck: Normal range of motion. Neck supple.  Cardiovascular: Normal rate, regular rhythm, normal heart sounds and intact distal pulses.  Pulmonary/Chest: Effort normal and breath sounds normal.  Abdominal: Soft. There is tenderness in the epigastric area and left lower quadrant.  Musculoskeletal: Normal  range of motion.  Neurological: She is alert and oriented to person, place, and time.  Skin: Capillary refill takes less than 2 seconds. There is pallor.  Psychiatric: She has a normal mood and affect. Her behavior is normal. Judgment and thought content normal.  Nursing note and vitals reviewed.    ED Treatments / Results  Labs (all labs ordered are listed, but only abnormal results are displayed) Labs Reviewed  COMPREHENSIVE METABOLIC PANEL - Abnormal; Notable for the following components:      Result Value   Potassium 5.9 (*)    CO2 16 (*)    Glucose, Bld 276 (*)    BUN 56 (*)    Creatinine, Ser 2.17 (*)    Calcium 8.2 (*)    Total Protein 5.0 (*)    Albumin 2.5 (*)    GFR calc non Af Amer 23 (*)    GFR calc Af Amer 27 (*)    Anion gap 16 (*)    All other components within normal limits  CBC - Abnormal; Notable for the following components:   WBC 14.0 (*)    RBC 2.04 (*)    Hemoglobin 6.4 (*)    HCT 20.8 (*)    MCV 102.0 (*)    RDW 17.7 (*)    All other components within normal limits  POC OCCULT BLOOD, ED - Abnormal; Notable for the following components:   Fecal Occult Bld POSITIVE (*)    All other components within normal limits  TROPONIN I  PROTIME-INR  TYPE AND SCREEN  PREPARE RBC (CROSSMATCH)    EKG  EKG Interpretation  Date/Time:  Tuesday August 01 2017 13:03:20 EST Ventricular Rate:  57 PR Interval:    QRS Duration: 90 QT Interval:  495 QTC Calculation: 482 R Axis:   26 Text Interpretation:  Sinus rhythm Confirmed by Isla Pence [phone]) on 08/01/2017 1:07:48 PM       Radiology Dg Chest 2 View  Result Date: 08/01/2017 CLINICAL DATA:  Rectal bleeding.  Diarrhea. EXAM: CHEST  2 VIEW COMPARISON:  05/07/2004. FINDINGS: Mediastinum and hilar structures normal. Prior CABG. Heart size normal. No focal infiltrate. No pleural effusion or pneumothorax. IMPRESSION: No acute cardiopulmonary disease.  Prior CABG.  Heart size normal. Electronically Signed    By: Marcello Moores  Register   On: 08/01/2017 14:03    Procedures Procedures (including critical care time)  Medications Ordered in ED Medications  pantoprazole (PROTONIX) 80 mg in sodium chloride 0.9 % 250 mL (0.32 mg/mL) infusion (8 mg/hr Intravenous New Bag/Given 08/01/17 1443)  sodium chloride 0.9 % bolus 1,000 mL (1,000 mLs Intravenous New Bag/Given 08/01/17 1442)  pantoprazole (PROTONIX) 80 mg in sodium chloride 0.9 % 100 mL IVPB (0 mg Intravenous Stopped 08/01/17 1439)     Initial Impression / Assessment and Plan / ED Course  I have reviewed the triage vital signs and the nursing notes.  Pertinent labs & imaging results that were available during my care of the patient were reviewed by me and considered in my medical decision making (see chart for details).  Vitals are stable.  2 units prbcs ordered and started in the ED.    CRITICAL CARE Performed by: Isla Pence   Total critical care time: 30 minutes  Critical care time was exclusive of separately billable procedures and treating other patients.  Critical care was necessary to treat or prevent imminent or life-threatening deterioration.  Critical care was time spent personally by me on the following activities: development of treatment plan with patient and/or surrogate as well as nursing, discussions with consultants, evaluation of patient's response to treatment, examination of patient, obtaining history from patient or surrogate, ordering and  performing treatments and interventions, ordering and review of laboratory studies, ordering and review of radiographic studies, pulse oximetry and re-evaluation of patient's condition.  Pt has seen Dr. Henrene Pastor ([facility]) in the past for previous colonoscopies.  Pt d/w [facility] who said they will consult.  Pt d/w Dr. Jamse Arn ([facility]) who will admit.  Final Clinical Impressions(s) / ED Diagnoses   Final diagnoses:  Acute GI bleeding  Acute post-hemorrhagic anemia  CKD (chronic  kidney disease) stage 4, GFR 15-29 ml/min (HCC)  On rivaroxaban therapy    ED Discharge Orders    None       Isla Pence, MD 08/01/17 1504

## 2017-08-01 NOTE — H&P (Signed)
History and Physical:    [name]   KHT:[id number] DOB: Mar 14, 1956 DOA: 08/01/2017  Referring MD/provider:  Dr Gilford Raid  PCP: Nicholes Rough, PA-C   Patient coming from: Home  Chief Complaint: Red stool  History of Present Illness:   [name] is an 62 y.o. female with multiple medical problems including extensive atherosclerotic disease with CAD, PVD and carotid artery stenosis bilaterally who was started on Xarelto in January and was in her usual state of health until about 9 days prior to admission when she noted onset of left lower quadrant pain associated with diarrhea. Patient noted the diarrhea was purple red in color but thought this was something that she may have eaten. Patient became progressively weaker and more tired and started feeling like she would pass out when she was standing up. Patient's daughter thought her stool was most likely blood and patient was seen by her PCP who drew a CBC and noted to be very low. Patient was sent to the ER.  Patient admits to presyncope when she stands up straight over the past 2 or 3 days. She has not actually had frank syncope. She has not had any head trauma. Patient does admit to dyspnea on exertion over the past 2-3 days as well as some chest heaviness when she was walking. Patient notes the chest heaviness would resolve when she would sit and never lasted more than 5 minutes at a time. At no time did she have diaphoresis nausea or vomiting. At no time did she have resting chest pain. Patient denies dysuria.  ED Course:  The patient was noted to have bloody stool with clots in them when she arrived at the ER. Initial BP was 97/43 but this responded well to 1 L fluids. Patient was seen by GI who recommended transfusion, IV protonix, abdomen and pelvis CT and preparation for EGD in a.m.  ROS:   ROS   Review of Systems: General: No fever, chills, weight changes Skin: No rashes, lesions, wounds Eyes: no discharge, redness,  pain HENT: no ear pain, hearing loss, drainage, tinnitus Endocrine: no heat/cold intolerance, no polyuria Respiratory: No cough or hemoptysis GU: No dysuria, increased frequency CNS: No numbness, dizziness, headache Musculoskeletal: No back pain, joint pain   Past Medical History:   Past Medical History:  Diagnosis Date  . Adenomatous polyp of colon   . Anemia   . Arthritis   . Asthma   . Carotid artery disease ([place])   . Carpal tunnel syndrome   . Chronic renal insufficiency    Stage 3 Kidney disease  . Coronary artery disease   . Depression   . Depression with anxiety   . Diabetes mellitus    type II  . Diverticulosis of colon (without mention of hemorrhage)   . GERD (gastroesophageal reflux disease)   . Heart murmur   . History of hiatal hernia   . Hyperlipidemia   . Hypertension   . Hypothyroidism   . Obesity   . PVD (peripheral vascular disease) ([place])   . Thyroid disease    hypo    Past Surgical History:   Past Surgical History:  Procedure Laterality Date  . ABDOMINAL AORTAGRAM N/A 12/18/2013   Procedure: ABDOMINAL [name];  Surgeon: Wellington Hampshire, MD;  Location: [facility] CATH [facility];  Service: Cardiovascular;  Laterality: N/A;  . ABDOMINAL AORTOGRAM W/LOWER EXTREMITY N/A 11/16/2016   Procedure: Abdominal Aortogram w/Lower Extremity;  Surgeon: Wellington Hampshire, MD;  Location: [facility]  [facility];  Service: Cardiovascular;  Laterality: N/A;  . ABDOMINAL AORTOGRAM W/LOWER EXTREMITY N/A 01/25/2017   Procedure: Abdominal Aortogram w/Lower Extremity;  Surgeon: Wellington Hampshire, MD;  Location: [facility];  Service: Cardiovascular;  Laterality: N/A;  only completed Lower Extremity  . ABDOMINAL HYSTERECTOMY    . APPENDECTOMY    . APPLICATION OF WOUND VAC Right 02/13/2017   Procedure: APPLICATION OF INCISIONAL WOUND VAC RIGHT GROIN;  Surgeon: Elam Dutch, MD;  Location: [facility];  Service: Vascular;  Laterality: Right;  . CHOLECYSTECTOMY    . CORONARY ARTERY BYPASS  GRAFT  2004   x4  . ENDARTERECTOMY FEMORAL Left 11/23/2016   Procedure: ENDARTERECTOMY OF LEFT EXTERNAL COMMON FEMORAL ARTERY WITH EXTENDED LEFT PROFUNDOPLASTY;  Surgeon: Elam Dutch, MD;  Location: [facility];  Service: Vascular;  Laterality: Left;  . ENDARTERECTOMY FEMORAL Right 02/13/2017   Procedure: ENDARTERECTOMY RIGHT FEMORAL ARTERY WITH PROFUNDAPLASTY;  Surgeon: Elam Dutch, MD;  Location: [facility];  Service: Vascular;  Laterality: Right;  . FEMORAL-POPLITEAL BYPASS GRAFT Left 11/23/2016   Procedure: LEFT FEMORAL-BELOW KNEE POPLITEAL ARTERY BYPASS;  Surgeon: Elam Dutch, MD;  Location: [facility];  Service: Vascular;  Laterality: Left;  . FEMORAL-POPLITEAL BYPASS GRAFT Right 02/13/2017   Procedure: BYPASS GRAFT RIGHT FEMORAL-BELOW KNEE POPLITEAL ARTERY;  Surgeon: Elam Dutch, MD;  Location: [facility];  Service: Vascular;  Laterality: Right;  . KNEE SURGERY Right 2013   arthroscopy  . PATCH ANGIOPLASTY Right 02/13/2017   Procedure: VEIN PATCH ANGIOPLASTY RIGHT POPLITEAL ARTERY AND HEMASHIELD PATCH ANGIOPLASTY OF RIGHT FEMORAL ARTERY;  Surgeon: Elam Dutch, MD;  Location: [facility];  Service: Vascular;  Laterality: Right;    Social History:   Social History   Socioeconomic History  . Marital status: Widowed    Spouse name: Not on file  . Number of children: 2  . Years of education: Not on file  . Highest education level: Not on file  Social Needs  . Financial resource strain: Not on file  . Food insecurity - worry: Not on file  . Food insecurity - inability: Not on file  . Transportation needs - medical: Not on file  . Transportation needs - non-medical: Not on file  Occupational History  . 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 Smoking status: Former Smoker    Packs/day: 1.00    Years: 30.00    Pack years: 30.00    Types: Cigarettes    Last attempt to quit: 2004    Years since quitting: 15.0  . Smokeless  tobacco: Never Used  Substance and Sexual Activity  . Alcohol use: No    Alcohol/week: 0.0 oz  . Drug use: No  . Sexual activity: Not on file  Other Topics Concern  . Not on file  Social History Narrative   Daily Caffeine    Allergies   Patient has no known allergies.  Family history:   Family History  Problem Relation Age of Onset  . Diabetes Mother   . Heart disease Father   . Colon polyps Daughter   . Colon cancer Neg Hx   . Esophageal cancer Neg Hx   . Stomach cancer Neg Hx   . Rectal cancer Neg Hx     Current Medications:   Prior to Admission medications   Medication Sig Start Date End Date Taking? Authorizing Provider  albuterol (PROVENTIL HFA;VENTOLIN HFA) 108 (90 BASE) MCG/ACT  inhaler Inhale 2 puffs into the lungs every 6 (six) hours as needed for wheezing or shortness of breath.    Yes [provider]  ALPRAZolam Duanne Moron) 1 MG tablet Take 1 mg by mouth 2 (two) times daily as needed for anxiety.  05/11/11  Yes [provider]  amLODipine (NORVASC) 5 MG tablet Take 1 tablet (5 mg total) by mouth at bedtime. 06/27/17  Yes Wellington Hampshire, MD  aspirin EC 81 MG tablet Take 81 mg by mouth at bedtime.   Yes [provider]  atorvastatin (LIPITOR) 80 MG tablet Take 80 mg by mouth every morning.    Yes [provider]  bismuth subsalicylate (PEPTO BISMOL) 262 MG/15ML suspension Take 30 mLs by mouth every 6 (six) hours as needed for diarrhea or loose stools.   Yes [provider]  calcitRIOL (ROCALTROL) 0.25 MCG capsule Take 0.25 mcg by mouth 3 (three) times a week. Monday, Wednesday, Friday 07/02/17  Yes [provider]  cetirizine (ZYRTEC) 10 MG tablet Take 10 mg by mouth daily. 06/30/17  Yes [provider]  fenofibrate micronized (LOFIBRA) 200 MG capsule Take 200 mg by mouth at bedtime.    Yes [provider]  ferrous sulfate 325 (65 FE) MG tablet Take 325 mg by mouth 3 (three) times daily. 05/10/15   Yes [provider]  furosemide (LASIX) 40 MG tablet Take 40 mg by mouth 2 (two) times daily.  05/08/11  Yes [provider]  levothyroxine (SYNTHROID, LEVOTHROID) 175 MCG tablet Take 175 mcg by mouth daily before breakfast.  08/09/15  Yes [provider]  loperamide (IMODIUM) 2 MG capsule Take 4 mg by mouth as needed for diarrhea or loose stools.   Yes [provider]  metoprolol (LOPRESSOR) 50 MG tablet Take 100-150 mg by mouth See admin instructions. Takes 100 mg in the morning and 164m in the evening. 11/13/16  Yes [provider]  NARCAN 4 MG/0.1ML LIQD nasal spray kit Place 1 spray into both nostrils See admin instructions. As needed in case of an overdose 01/27/17  Yes [provider]  Omega-3 Fatty Acids (FISH OIL) 1000 MG CAPS Take 1,000 mg by mouth 2 (two) times daily.   Yes [provider]  omeprazole (PRILOSEC) 40 MG capsule Take 40 mg by mouth daily.   Yes [provider]  oxyCODONE (OXYCONTIN) 15 mg 12 hr tablet Take 15 mg by mouth every 12 (twelve) hours.   Yes [provider]  oxyCODONE-acetaminophen (PERCOCET/ROXICET) 5-325 MG tablet Take 1 tablet by mouth every 8 (eight) hours as needed for severe pain. 03/30/17  Yes Fields, CJessy Oto MD  PARoxetine (PAXIL) 40 MG tablet Take 40 mg by mouth at bedtime.    Yes [provider]  promethazine (PHENERGAN) 25 MG tablet Take 25 mg by mouth daily as needed for nausea or vomiting.  08/06/16  Yes [provider]  rivaroxaban (XARELTO) 2.5 MG TABS tablet Take 1 tablet (2.5 mg total) by mouth 2 (two) times daily. 06/27/17  Yes AWellington Hampshire MD  sertraline (ZOLOFT) 100 MG tablet Take 100 mg by mouth daily. 03/30/15  Yes [provider]  Vitamin D, Ergocalciferol, (DRISDOL) 50000 UNITS CAPS capsule Take 50,000 Units by mouth once a week. mondays 10/10/14  Yes [provider]  VOLTAREN 1 % GEL APPLY TO AFFECTED AREA 4 GRAMS 3 TIMES  DAILY AS NEEDED 05/03/17  Yes [provider]  B-D INS SYR ULTRAFINE 1CC/31G 31G X 5/16" 1 ML  MISC USE NEW NEEDLE FOR MEAL TIME 3 TIMES A DAY AND LONG ACTING INSULIN TWICE A DAY 05/27/15   [provider]  Insulin Glargine (BASAGLAR KWIKPEN) 100 UNIT/ML SOPN Inject 20-60 Units into the skin 2 (two) times daily before a meal. Sliding Scale 01/26/17   [provider]  Roma Schanz test strip [place] 4-5 TIMES A DAY 06/08/15   [provider]    Physical Exam:   Vitals:   08/01/17 1400 08/01/17 1415 08/01/17 1535 08/01/17 1555  BP: (!) 139/42 119/82 (!) 131/29 (!) 131/29  Pulse: 68 65 67 65  Resp: '19 15 16 13  ' Temp:   98 F (36.7 C) 98.6 F (37 C)  TempSrc:   Oral Oral  SpO2: 100% 100% 97% 100%     Physical Exam: Blood pressure (!) 131/29, pulse 65, temperature 98.6 F (37 C), temperature source Oral, resp. rate 13, SpO2 100 %. Gen: Somewhat tired appearing female lying flat in bed in no distress. Eyes: Sclerae anicteric. Conjunctiva mildly injected. Neck: Supple, no jugular venous distention. Chest: Moderately good air entry bilaterally with no adventitious sounds.  CV: Distant, regular, no audible murmurs. Abdomen: NABS, soft, obese. She has mild tenderness to light and deep palpation left lower quadrant without rebound tenderness. No guarding. Extremities: No edema.  Skin: Warm and dry. No rashes, lesions or wounds. Neuro: Alert and oriented times 3; grossly nonfocal. Psych: Patient is cooperative, logical and coherent with appropriate mood and affect.  Data Review:    Labs: Basic Metabolic Panel: Recent Labs  Lab 08/01/17 1256  NA 136  K 5.9*  CL 104  CO2 16*  GLUCOSE 276*  BUN 56*  CREATININE 2.17*  CALCIUM 8.2*   Liver Function Tests: Recent Labs  Lab 08/01/17 1256  AST 35  ALT 24  ALKPHOS 104  BILITOT 1.2  PROT 5.0*  ALBUMIN 2.5*   No results for input(s): LIPASE, AMYLASE in the last 168 hours. No  results for input(s): AMMONIA in the last 168 hours. CBC: Recent Labs  Lab 08/01/17 1256  WBC 14.0*  HGB 6.4*  HCT 20.8*  MCV 102.0*  PLT 286   Cardiac Enzymes: Recent Labs  Lab 08/01/17 1256  TROPONINI <0.03    BNP (last 3 results) No results for input(s): PROBNP in the last 8760 hours. CBG: No results for input(s): GLUCAP in the last 168 hours.  Urinalysis    Component Value Date/Time   COLORURINE YELLOW 02/09/2017 1025   APPEARANCEUR CLOUDY (A) 02/09/2017 1025   LABSPEC 1.008 02/09/2017 1025   PHURINE 5.0 02/09/2017 1025   GLUCOSEU NEGATIVE 02/09/2017 1025   HGBUR MODERATE (A) 02/09/2017 1025   BILIRUBINUR NEGATIVE 02/09/2017 1025   KETONESUR NEGATIVE 02/09/2017 1025   PROTEINUR 100 (A) 02/09/2017 1025   NITRITE NEGATIVE 02/09/2017 1025   LEUKOCYTESUR LARGE (A) 02/09/2017 1025      Radiographic Studies: Dg Chest 2 View  Result Date: 08/01/2017 CLINICAL DATA:  Rectal bleeding.  Diarrhea. EXAM: CHEST  2 VIEW COMPARISON:  05/07/2004. FINDINGS: Mediastinum and hilar structures normal. Prior CABG. Heart size normal. No focal infiltrate. No pleural effusion or pneumothorax. IMPRESSION: No acute cardiopulmonary disease.  Prior CABG.  Heart size normal. Electronically Signed   By: Marcello Moores  Register   On: 08/01/2017 14:03    EKG: Independently reviewed. Normal sinus rhythm at 60. Generally flat T waves. Mild T wave inversions V1 and V2.   Assessment/Plan:   Active Problems:   GIB (gastrointestinal bleeding)  GI  BLEED Appreciate gi input--ct scan and egd per them to rule out colitis versus upper GI source of bleed. We will support cardiovascularly with PRBC and fluids as needed Monitor cbc q 6h Hold lasix and amlodipine  Hold xarelto and all nsaids EGD in a.m.  HTN Will hold lasix and amlodipine given ongoing gi bleed They can be added back as bp rises once bleeding has stopped Will continue metoprolol for ongoing cardioprotection  DM Patient states that  she takes anywhere from 30-60 units of glargine which she determines based upon what she plans to eat over the course of the day as well as her morning fingerstick. I will write her for 20 units of glargine daily and cover with sliding scale insulin before meals at bedtime.  ANEMIA Patient with elevated MCV to 102. Most recent MCV and August was 86.6.  This probably represents reticulocytosis.  Would repeat MCV as an outpatient once this acute bleeding episode is over.  CRF Patient with chronic renal insufficiency with creatinine ranging from 1.3-2.0 over the past year.  Creatinine slightly elevated at 2.2 now, this should improve with transfusion and with increasing of intravascular volume.  CAD/PVD/CAROTID ARTERY DISEASE Patient with significant extensive atherosclerotic disease. She is status post CABG as well as femoral endarterectomy and bypass. She is clinically much improved since the bypass in August 2018. She was started on her Xarelto for PVD, however she clearly is not tolerating it. She will need follow-up as an outpatient with Dr. Fletcher Anon since the [place] will need to be discontinued.  ABNORMAL UA Patient without any symptoms of dysuria urgency or frequency. Likely asymptomatic bacteriuria. Will not treat for now.  CHRONIC PAIN Continue oxycontin and oxycodone per home regimen Hold all nsaids   DEPRESSION Continue paxil and alprazolam per home doses   HYPOTHYROIDISM Continue synthroid      Other information:   DVT prophylaxis: Lovenox ordered. Code Status: Full code. Family Communication: Patient states her family know she is in hospital  Disposition Plan: Home Consults called: GI Admission status: Inpatient   The medical decision making on this patient was of high complexity and the patient is at high risk for clinical deterioration, therefore this is a level 3 visit.   Dewaine Oats Derek Jack [facility] Pager [phone] Cell: [phone]   If 7PM-7AM, please contact night-coverage [email or web address] Password [facility] 08/01/2017, 4:06 PM

## 2017-08-02 ENCOUNTER — Encounter (HOSPITAL_COMMUNITY): Payer: Self-pay | Admitting: *Deleted

## 2017-08-02 ENCOUNTER — Encounter (HOSPITAL_COMMUNITY)
Admission: EM | Disposition: A | Discharge status: Home / Self Care | Payer: Self-pay | Source: Home / Self Care | Attending: Family Medicine

## 2017-08-02 ENCOUNTER — Other Ambulatory Visit: Payer: Self-pay

## 2017-08-02 ENCOUNTER — Inpatient Hospital Stay (HOSPITAL_COMMUNITY): Payer: Medicare Other | Admitting: Anesthesiology

## 2017-08-02 DIAGNOSIS — K921 Melena: Principal | ICD-10-CM

## 2017-08-02 DIAGNOSIS — D62 Acute posthemorrhagic anemia: Secondary | ICD-10-CM

## 2017-08-02 DIAGNOSIS — R933 Abnormal findings on diagnostic imaging of other parts of digestive tract: Secondary | ICD-10-CM

## 2017-08-02 HISTORY — PX: ESOPHAGOGASTRODUODENOSCOPY: SHX5428

## 2017-08-02 LAB — BASIC METABOLIC PANEL
ANION GAP: 11 (ref 5–15)
BUN: 44 mg/dL — AB (ref 6–20)
CHLORIDE: 109 mmol/L (ref 101–111)
CO2: 19 mmol/L — ABNORMAL LOW (ref 22–32)
Calcium: 7.8 mg/dL — ABNORMAL LOW (ref 8.9–10.3)
Creatinine, Ser: 1.56 mg/dL — ABNORMAL HIGH (ref 0.44–1.00)
GFR calc Af Amer: 40 mL/min — ABNORMAL LOW (ref >60)
GFR, EST NON AFRICAN AMERICAN: 35 mL/min — AB (ref >60)
Glucose, Bld: 157 mg/dL — ABNORMAL HIGH (ref 65–99)
POTASSIUM: 4.1 mmol/L (ref 3.5–5.1)
SODIUM: 139 mmol/L (ref 135–145)

## 2017-08-02 LAB — GLUCOSE, CAPILLARY
Glucose-Capillary: 168 mg/dL — ABNORMAL HIGH (ref 65–99)
Glucose-Capillary: 145 mg/dL — ABNORMAL HIGH (ref 65–99)
Glucose-Capillary: 317 mg/dL — ABNORMAL HIGH (ref 65–99)
Glucose-Capillary: 277 mg/dL — ABNORMAL HIGH (ref 65–99)

## 2017-08-02 LAB — TYPE AND SCREEN
ABO/RH(D): A POS
Antibody Screen: NEGATIVE
Unit division: 0
Unit division: 0

## 2017-08-02 LAB — CBC
HCT: 25.7 % — ABNORMAL LOW (ref 36.0–46.0)
HEMOGLOBIN: 8.3 g/dL — AB (ref 12.0–15.0)
MCH: 30 pg (ref 26.0–34.0)
MCHC: 32.3 g/dL (ref 30.0–36.0)
MCV: 92.8 fL (ref 78.0–100.0)
Platelets: 139 10*3/uL — ABNORMAL LOW (ref 150–400)
RBC: 2.77 MIL/uL — AB (ref 3.87–5.11)
RDW: 17.7 % — ABNORMAL HIGH (ref 11.5–15.5)
WBC: 7.3 10*3/uL (ref 4.0–10.5)

## 2017-08-02 LAB — BPAM RBC
BLOOD PRODUCT EXPIRATION DATE: 201901232359
Blood Product Expiration Date: 201902072359
ISSUE DATE / TIME: 201901221521
ISSUE DATE / TIME: 201901221849
UNIT TYPE AND RH: 6200
Unit Type and Rh: 6200

## 2017-08-02 LAB — MRSA PCR SCREENING: MRSA by PCR: NEGATIVE

## 2017-08-02 LAB — HIV ANTIBODY (ROUTINE TESTING W REFLEX): HIV Screen 4th Generation wRfx: NONREACTIVE

## 2017-08-02 SURGERY — EGD (ESOPHAGOGASTRODUODENOSCOPY)
Anesthesia: Monitor Anesthesia Care

## 2017-08-02 MED ORDER — INSULIN ASPART 100 UNIT/ML ~~LOC~~ SOLN
0.0000 [IU] | Freq: Three times a day (TID) | SUBCUTANEOUS | Status: DC
  Administered 2017-08-03: 4 [IU] via SUBCUTANEOUS

## 2017-08-02 MED ORDER — PANTOPRAZOLE SODIUM 40 MG IV SOLR
40.0000 mg | Freq: Two times a day (BID) | INTRAVENOUS | Status: DC
  Administered 2017-08-02: 40 mg via INTRAVENOUS
  Filled 2017-08-02: qty 40

## 2017-08-02 MED ORDER — SODIUM CHLORIDE 0.9 % IV SOLN
INTRAVENOUS | Status: DC | PRN
  Administered 2017-08-02: 12:00:00 via INTRAVENOUS

## 2017-08-02 MED ORDER — PROPOFOL 10 MG/ML IV BOLUS
INTRAVENOUS | Status: DC | PRN
  Administered 2017-08-02 (×2): 20 mg via INTRAVENOUS

## 2017-08-02 MED ORDER — LIDOCAINE HCL (CARDIAC) 20 MG/ML IV SOLN
INTRAVENOUS | Status: DC | PRN
  Administered 2017-08-02: 100 mg via INTRATRACHEAL

## 2017-08-02 MED ORDER — HYDRALAZINE HCL 20 MG/ML IJ SOLN: 10.0000 mg | INTRAMUSCULAR | Status: DC | PRN

## 2017-08-02 MED ORDER — PROPOFOL 500 MG/50ML IV EMUL
INTRAVENOUS | Status: DC | PRN
  Administered 2017-08-02: 125 ug/kg/min via INTRAVENOUS

## 2017-08-02 MED ORDER — INSULIN ASPART 100 UNIT/ML ~~LOC~~ SOLN
0.0000 [IU] | Freq: Every day | SUBCUTANEOUS | Status: DC
  Administered 2017-08-02: 3 [IU] via SUBCUTANEOUS

## 2017-08-02 MED ORDER — LACTATED RINGERS IV SOLN
INTRAVENOUS | Status: DC
  Administered 2017-08-02: 12:00:00 via INTRAVENOUS

## 2017-08-02 NOTE — Progress Notes (Signed)
Daily Rounding Note  08/02/2017, 8:21 AM  LOS: 1 day   SUBJECTIVE:   Chief complaint: bloody stools: continue.  2 overnight.  Left abd pain improved.  No nausea or emesis.  Overall feeling better.  Thirsty but not hungry.      OBJECTIVE:         Vital signs in last 24 hours:    Temp:  [97.6 F (36.4 C)-99 F (37.2 C)] 98.2 F (36.8 C) (01/23 0735) Pulse Rate:  [56-75] 70 (01/23 0800) Resp:  [10-93] 15 (01/23 0800) BP: (97-148)/(22-93) 138/54 (01/23 0800) SpO2:  [83 %-100 %] 95 % (01/23 0800) Weight:  [76.2 kg (167 lb 15.9 oz)] 76.2 kg (167 lb 15.9 oz) (01/22 2000) Last BM Date: 08/01/17 Filed Weights   08/01/17 2000  Weight: 76.2 kg (167 lb 15.9 oz)   General: pleasant, comfortable.  Chronically ill looking   Heart: RRR Chest: clear bil.  No labored breathing.  Occasional loose-sounding cough Abdomen: soft, ND.  BS quiet.  Mild left tenderness.    Extremities: no CCE.   Neuro/Psych:  Oriented x 3.  Fully alert.  Pleasant, calm.  Moves all  4 limbs  Intake/Output from previous day: 01/22 0701 - 01/23 0700 In: 2220 [P.O.:240; I.V.:250; Blood:630; IV Piggyback:1100] Out: -   Intake/Output this shift: No intake/output data recorded.  Lab Results: Recent Labs    08/01/17 1256 08/01/17 2326 08/02/17 0343  WBC 14.0* 8.0 7.3  HGB 6.4* 8.7* 8.3*  HCT 20.8* 26.9* 25.7*  PLT 286 144* 139*   BMET Recent Labs    08/01/17 1256 08/02/17 0343  NA 136 139  K 5.9* 4.1  CL 104 109  CO2 16* 19*  GLUCOSE 276* 157*  BUN 56* 44*  CREATININE 2.17* 1.56*  CALCIUM 8.2* 7.8*   LFT Recent Labs    08/01/17 1256  PROT 5.0*  ALBUMIN 2.5*  AST 35  ALT 24  ALKPHOS 104  BILITOT 1.2   PT/INR Recent Labs    08/01/17 1434  LABPROT 14.5  INR 1.14   Hepatitis Panel No results for input(s): HEPBSAG, HCVAB, HEPAIGM, HEPBIGM in the last 72 hours.  Studies/Results: Ct Abdomen Pelvis Wo Contrast  Result Date:  08/01/2017 CLINICAL DATA:  Dark tarry stools for 1 week fatigue and shortness of breath EXAM: CT ABDOMEN AND PELVIS WITHOUT CONTRAST TECHNIQUE: Multidetector CT imaging of the abdomen and pelvis was performed following the standard protocol without IV contrast. COMPARISON:  Report 12/28/2001 FINDINGS: Lower chest: Lung bases demonstrate no acute consolidation or pleural effusion. Coronary artery calcification. Hepatobiliary: No focal hepatic abnormality. Surgical clips at the gallbladder fossa. Prominent extrahepatic bile duct likely due to postsurgical change Pancreas: Unremarkable. No pancreatic ductal dilatation or surrounding inflammatory changes. Spleen: Normal in size without focal abnormality. Adrenals/Urinary Tract: Adrenal glands are within normal limits. Kidneys show no hydronephrosis. Probable cyst in the lower pole of left kidney. Intrarenal vascular calcifications. Small amount of air in the bladder, query any recent history of instrumentation. Stomach/Bowel: Possible wall thickening of the GE junction and proximal stomach. No dilated small bowel. Thickened appearance of the terminal ileum and ileocecal region appendix is not well seen. Vascular/Lymphatic: Extensive aortic atherosclerosis. No aneurysmal dilatation. Marked calcifications of mesenteric branch vessels. No significantly enlarged lymph nodes. Reproductive: Status post hysterectomy. No adnexal masses. Other: Negative for free air or free fluid Musculoskeletal: Degenerative changes. No acute or suspicious bone lesion IMPRESSION: 1. Mild wall thickening of the terminal ileum  and ileocecal junction. Findings could be secondary to ileitis as may be seen with infectious or inflammatory bowel disease; infiltrative mass is also in the differential. Suggest correlation with direct visualization. 2. Borderline to slight thickening of the GE junction and proximal stomach with possible gastric fold thickening. Consider gastritis versus mass; this may  also be evaluated with direct visualization. 3. Extensive vascular calcifications. 4. Small amount of air in the bladder, suspect that this may be related to recent instrumentation. Electronically Signed   By: Donavan Foil M.D.   On: 08/01/2017 18:10   Dg Chest 2 View  Result Date: 08/01/2017 CLINICAL DATA:  Rectal bleeding.  Diarrhea. EXAM: CHEST  2 VIEW COMPARISON:  05/07/2004. FINDINGS: Mediastinum and hilar structures normal. Prior CABG. Heart size normal. No focal infiltrate. No pleural effusion or pneumothorax. IMPRESSION: No acute cardiopulmonary disease.  Prior CABG.  Heart size normal. Electronically Signed   By: Marcello Moores  Register   On: 08/01/2017 14:03   Scheduled Meds: . atorvastatin  80 mg Oral Q breakfast  . insulin aspart  0-15 Units Subcutaneous TID WC  . insulin glargine  20 Units Subcutaneous QHS  . levothyroxine  175 mcg Oral QAC breakfast  . metoprolol tartrate  100 mg Oral q morning - 10a   And  . metoprolol tartrate  150 mg Oral QHS  . oxyCODONE  15 mg Oral Q12H  . PARoxetine  40 mg Oral QHS   Continuous Infusions: . pantoprozole (PROTONIX) infusion 8 mg/hr (08/01/17 1443)   PRN Meds:.albuterol, ALPRAZolam, oxyCODONE-acetaminophen, promethazine   ASSESMENT:   *  GIB with maroon bloody stools, left abd pain.  Non IV contrast CT with inflammation of TI vs infiltrating mass, thickened GEJx/prox stomach, aortic vascular disease.   07/2016 surveillance colonoscopy with adenomatous and HP polyps.  Study reached normal appearing IC valve. Empiric Protonix gtt in place.    *  Acute blood loss on top of anemia of CKD.   Hgb up 2 gm after 2 U PRBC.   Macrocytosis.  Low iron and iron sat.  Normal Ferritin, B12, Folate.    *  Hyperkalemia, resolved.       *  Chronic Xarelto, last dose 1/21.  For extensive PVD requiring bil LE bypass.     *  Stage 3 CKD.     PLAN   *   EGD today at 11:45.  Switch to IV BID Protonix.  CBC in AM.     Azucena Freed  08/02/2017, 8:21  AM Pager: [phone]    Attending physician's note   I have taken an interval history, reviewed the chart and examined the patient. I agree with the Advanced Practitioner's note, impression and recommendations. Hematochezia, diarrhea, abdominal pain and anemia are all improved. CT c/w ileitis/colitis at TI and IC valve. CT also showed a thickening at EGJ and proximal stomach. EGD today to further evaluate. Ileitis/colitis likely infectious or ischemic. No plans for colonoscopy at this time.    Lucio Edward, MD [facility] [phone] Mon-Fri 8a-5p [phone] after 5p, weekends, holidays

## 2017-08-02 NOTE — Anesthesia Preprocedure Evaluation (Signed)
Anesthesia Evaluation  Patient identified by MRN, date of birth, ID band Patient awake    Reviewed: Allergy & Precautions, H&P , NPO status , Patient's Chart, lab work & pertinent test results, reviewed documented beta blocker date and time   Airway Mallampati: II  TM Distance: >3 FB Neck ROM: Full    Dental  (+) Edentulous Upper, Edentulous Lower, Dental Advisory Given   Pulmonary asthma , former smoker,    Pulmonary exam normal        Cardiovascular Exercise Tolerance: Good hypertension, Pt. on medications and Pt. on home beta blockers + CAD, + CABG and + Peripheral Vascular Disease  Normal cardiovascular exam+ Valvular Problems/Murmurs  Rhythm:Regular     Neuro/Psych Depression negative neurological ROS     GI/Hepatic Neg liver ROS, GERD  Medicated and Controlled,  Endo/Other  diabetes, Type 2, Insulin DependentHypothyroidism   Renal/GU Renal InsufficiencyRenal disease  negative genitourinary   Musculoskeletal  (+) Arthritis , Osteoarthritis,    Abdominal   Peds  Hematology negative hematology ROS (+) anemia ,   Anesthesia Other Findings   Reproductive/Obstetrics negative OB ROS                            Lab Results  Component Value Date   WBC 7.3 08/02/2017   HGB 8.3 (L) 08/02/2017   HCT 25.7 (L) 08/02/2017   MCV 92.8 08/02/2017   PLT 139 (L) 08/02/2017   Lab Results  Component Value Date   CREATININE 1.56 (H) 08/02/2017   BUN 44 (H) 08/02/2017   NA 139 08/02/2017   K 4.1 08/02/2017   CL 109 08/02/2017   CO2 19 (L) 08/02/2017   11/2016 Study Conclusions  - Left ventricle: The cavity size was normal. Wall thickness was  normal. Systolic function was normal. The estimated ejection fraction was in the range of 55% to 60%. Wall motion was normal; there were no regional wall motion abnormalities. Doppler  parameters are consistent with both elevated ventricular  end-diastolic  filling pressure and elevated left atrial filling  pressure. - Mitral valve: Calcified annulus. - Left atrium: The atrium was mildly dilated. - Atrial septum: No defect or patent foramen ovale was identified. - Pulmonary arteries: PA peak pressure: 46 mm Hg (S).  Anesthesia Physical  Anesthesia Plan  ASA: III  Anesthesia Plan: MAC   Post-op Pain Management:    Induction: Intravenous  PONV Risk Score and Plan: 2 and Ondansetron, Treatment may vary due to age or medical condition and Propofol infusion  Airway Management Planned:   Additional Equipment:   Intra-op Plan:   Post-operative Plan:   Informed Consent: I have reviewed the patients History and Physical, chart, labs and discussed the procedure including the risks, benefits and alternatives for the proposed anesthesia with the patient or authorized representative who has indicated his/her understanding and acceptance.   Dental advisory given  Plan Discussed with: CRNA  Anesthesia Plan Comments:        Anesthesia Quick Evaluation

## 2017-08-02 NOTE — Op Note (Addendum)
[facility] Patient Name: [name] Procedure Date : 08/02/2017 MRN: [id number] Attending MD: Ladene Artist , MD Date of Birth: 1956/02/21 CSN: [id number] Age: 62 Admit Type: Inpatient Procedure:                Upper GI endoscopy Indications:              Hematochezia, Abnormal CT of the GI tract (EG                            junction, proximal stomach) Providers:                Pricilla Riffle. Fuller Plan, MD, Angus Seller, Tinnie Gens,                            Technician, Gershon Crane CRNA, CRNA Referring MD:             [facility] Medicines:                Monitored Anesthesia Care Complications:            No immediate complications. Estimated Blood Loss:     Estimated blood loss was minimal. Procedure:                Pre-Anesthesia Assessment:                           - Prior to the procedure, a History and Physical                            was performed, and patient medications and                            allergies were reviewed. The patient's tolerance of                            previous anesthesia was also reviewed. The risks                            and benefits of the procedure and the sedation                            options and risks were discussed with the patient.                            All questions were answered, and informed consent                            was obtained. Prior Anticoagulants: The patient has                            taken Xarelto (rivaroxaban), last dose was 2 days                            prior to procedure. ASA Grade Assessment: III - A  patient with severe systemic disease. After                            reviewing the risks and benefits, the patient was                            deemed in satisfactory condition to undergo the                            procedure.                           After obtaining informed consent, the endoscope was                            passed  under direct vision. Throughout the                            procedure, the patient's blood pressure, pulse, and                            oxygen saturations were monitored continuously. The                            EG-[id number] ([id number]) scope was introduced through the                            mouth, and advanced to the second part of duodenum.                            The upper GI endoscopy was accomplished without                            difficulty. The patient tolerated the procedure                            well. Scope In: Scope Out: Findings:      The examined esophagus was normal.      Localized moderate inflammation characterized by erosions, erythema,       nodules and granularity was found in the gastric antrum. Biopsies were       taken with a cold forceps for histology.      The exam of the stomach was otherwise normal.      The cardia and gastric fundus were normal on retroflexion.      The duodenal bulb and second portion of the duodenum were normal. Impression:               - Normal esophagus.                           - Nodular gastritis. Biopsied.                           - Normal duodenal bulb and second portion of the  duodenum. Moderate Sedation:      none/MAC Recommendation:           - Return patient to hospital ward for ongoing care.                           - Resume previous diet.                           - Continue present medications.                           - Await pathology results.                           - Resume Xarelto (rivaroxaban) at prior dose in 4                            days. Refer to managing physician for further                            adjustment of therapy.                           - Outpatient GI follow up with Dr. Scarlette Shorts.                           - Protonix (pantoprazole) 40 mg PO daily. Procedure Code(s):        --- Professional ---                           [phone],  Esophagogastroduodenoscopy, flexible,                            transoral; with biopsy, single or multiple Diagnosis Code(s):        --- Professional ---                           K29.60, Other gastritis without bleeding                           K92.1, Melena (includes Hematochezia)                           R93.3, Abnormal findings on diagnostic imaging of                            other parts of digestive tract CPT copyright 2016 [employer]. All rights reserved. The codes documented in this report are preliminary and upon coder review may  be revised to meet current compliance requirements. Ladene Artist, MD 08/02/2017 12:39:55 PM This report has been signed electronically. Number of Addenda: 0

## 2017-08-02 NOTE — Progress Notes (Signed)
PROGRESS NOTE  [name]  GUR:[id number] DOB: 02-18-1956 DOA: 08/01/2017 PCP: Nicholes Rough, PA-C  Outpatient Specialists: Kathrine Cords Brief Narrative: [name] is a 62 y.o. female with a history of PVD, CAD, carotid stenosis recently started on xarelto who presented to the ED on the advice of her PCP on 1/22 due to anemia in the setting of ~1 week of diarrhea that became maroon in color, then with red blood in loose stools, and became associated with lightheadedness, weakness. She was noted to have bloody stool with clots in the ED. Initial BP was 97/43 improved with1L IVF. GI consulted, recommended transfusion, IV PPI, CT abd/pelvis which was consistent with colitis/ileitis (ischemic vs. infectious) and thickening of the GE junction. Hgb improved 6.4 to 8.7 and bloody stools have improved but not resolved. EGD 1/23 showed nodular gastritis without other source of bleeding. No colonoscopy is planned.  Assessment & Plan: Principal Problem:   GIB (gastrointestinal bleeding) Active Problems:   DM2 (diabetes mellitus, type 2) (HCC)   Hyperlipidemia   ANXIETY DEPRESSION   Essential hypertension   Coronary atherosclerosis   Peripheral vascular disease (HCC)   Diverticulosis of colon   Abnormal CT scan, esophagus   Hematochezia  Acute blood loss anemia on chronic iron deficiency anemia: Due to hematochezia, possibly from ileitis.  - Restart xarelto (last dose 1/21) on chronic dosing 1/27 if bleeding subsides - Recheck CBC in AM - Recommend repeat anemia panel after recovery.   Colitis/ileitis: Most likely infectious vs. ischemic per GI.  - Advance diet as tolerated, supportive therapy.  - No abx recommended by GI.  Nodular gastritis:  - Continue protonix 40mg  po daily - Follow up with GI, Dr. Scarlette Shorts - Follow up gastric biopsy pathology.   Stage IV CKD: Baseline SCr 1.3-2. Cr 2.2 on admission, improved to 1.56 with transfusion/volume resuscitation.  - May need to consider  coumadin vs. stopping anticoagulation over DOAC if bleeding returns.  - Recheck in AM - Renally dose medications - Avoiding nephrotoxins.   Severe PVD: With claudication s/p left external iliac common femoral profunda endarterectomy with profundoplasty and left femoral to below-knee popliteal artery bypass May 2018 and right fem-pop bypass Aug 2018.  - Recently switched to xarelto 2.5mg  po BID by vascular surgeon which is held. On ASA as well. Will need early follow up with Dr. Fletcher Anon.  CAD s/p CABG 2004: No chest pain. ECG personally reviewed: Sinus bradycardia with normal axis, no ischemic changes. Neg troponin.  - Continue beta blocker - Holding norvasc, lasix with hypotension, ongoing bleeding. No evidence of volume overload. Hydralazine ordered prn.   IDT2DM: HbA1c 7.3%.  - Continue lantus 20u, will augment to resistant SSI and add HS correction given rise with restart of diet.   Hypothyroidism:  - Continue synthroid.  - No recent TSH, recommend recheck if not recently checked outside system following recovery from acute illness.   Asymptomatic bacteriuria: No treatment indicated.   Chronic pain:  - Continue home oxycontin, oxycodone.  - Discontinue NSAIDs  Depression: Chronic, stable - Continue paxil, xanax home doses  Hyperkalemia: Resolved. Suspect spurious result.   DVT prophylaxis: SCDs Code Status: Full Family Communication: None at bedside this AM Disposition Plan: Uncertain.   Consultants:   Jerrel Ivory  Procedures:   EGD 08/02/2016 Dr. Fuller Plan:       The examined esophagus was normal.      Localized moderate inflammation characterized by erosions, erythema,       nodules and granularity  was found in the gastric antrum. Biopsies were       taken with a cold forceps for histology.      The exam of the stomach was otherwise normal.      The cardia and gastric fundus were normal on retroflexion.      The duodenal bulb and second portion of the duodenum were  normal. Impression:                - Normal esophagus. - Nodular gastritis. Biopsied. - Normal duodenal bulb and second portion of the duodenum.  Recommendation:            - Return patient to hospital ward for ongoing care. - Resume previous diet. - Continue present medications. - Await pathology results. - Resume Xarelto (rivaroxaban) at prior dose in 4 days. Refer to managing physician for further adjustment of therapy. - Outpatient GI follow up with Dr. Scarlette Shorts. - Protonix (pantoprazole) 40 mg PO daily.  Antimicrobials:  None   Subjective: 2 loos stools overnight into this morning, some blood in them. Not particularly hungry this morning, but abdominal pain is minimal.   Objective: Vitals:   08/02/17 1240 08/02/17 1245 08/02/17 1250 08/02/17 1309  BP:  (!) 95/56 (!) 119/38 (!) 125/41  Pulse: 63 62 62 64  Resp: (!) 28 (!) 25 16 17   Temp:    98.3 F (36.8 C)  TempSrc:    Oral  SpO2: 100% 100% 100% 100%  Weight:      Height:        Intake/Output Summary (Last 24 hours) at 08/02/2017 1444 Last data filed at 08/02/2017 1247 Gross per 24 hour  Intake 2320 ml  Output 1 ml  Net 2319 ml   Filed Weights   08/01/17 2000  Weight: 76.2 kg (167 lb 15.9 oz)    Gen: Pleasant, chronically ill-appearing female in no distress Pulm: Non-labored breathing room air. Clear to auscultation bilaterally.  CV: Regular rate and rhythm. No murmur, rub, or gallop. No JVD, no pedal edema. GI: Abdomen soft, mildly tender diffusely in LLQ, LUQ, epigastrium. Not RLQ, non-distended, hypoactive bowel sounds. Ext: Warm, no deformities Skin: Surgical incision scars noted without wounds or bleeding.  Neuro: Alert and oriented. No focal neurological deficits. Psych: Judgement and insight appear normal. Mood & affect appropriate.   Data Reviewed: I have personally reviewed following labs and imaging studies  CBC: Recent Labs  Lab 08/01/17 1256 08/01/17 2326 08/02/17 0343  WBC 14.0* 8.0  7.3  HGB 6.4* 8.7* 8.3*  HCT 20.8* 26.9* 25.7*  MCV 102.0* 93.1 92.8  PLT 286 144* 413*   Basic Metabolic Panel: Recent Labs  Lab 08/01/17 1256 08/02/17 0343  NA 136 139  K 5.9* 4.1  CL 104 109  CO2 16* 19*  GLUCOSE 276* 157*  BUN 56* 44*  CREATININE 2.17* 1.56*  CALCIUM 8.2* 7.8*   GFR: Estimated Creatinine Clearance: 36.5 mL/min (A) (by C-G formula based on SCr of 1.56 mg/dL (H)). Liver Function Tests: Recent Labs  Lab 08/01/17 1256  AST 35  ALT 24  ALKPHOS 104  BILITOT 1.2  PROT 5.0*  ALBUMIN 2.5*   No results for input(s): LIPASE, AMYLASE in the last 168 hours. No results for input(s): AMMONIA in the last 168 hours. Coagulation Profile: Recent Labs  Lab 08/01/17 1434  INR 1.14   Cardiac Enzymes: Recent Labs  Lab 08/01/17 1256  TROPONINI <0.03   BNP (last 3 results) No results for input(s): PROBNP in the  last 8760 hours. HbA1C: No results for input(s): HGBA1C in the last 72 hours. CBG: Recent Labs  Lab 08/01/17 1706 08/01/17 2207 08/02/17 0734  GLUCAP 166* 179* 168*   Lipid Profile: No results for input(s): CHOL, HDL, LDLCALC, TRIG, CHOLHDL, LDLDIRECT in the last 72 hours. Thyroid Function Tests: No results for input(s): TSH, T4TOTAL, FREET4, T3FREE, THYROIDAB in the last 72 hours. Anemia Panel: Recent Labs    08/01/17 1551  VITAMINB12 262  FOLATE 12.2  FERRITIN 50  TIBC 294  IRON 21*  RETICCTPCT 16.0*   Urine analysis:    Component Value Date/Time   COLORURINE YELLOW 02/09/2017 1025   APPEARANCEUR CLOUDY (A) 02/09/2017 1025   LABSPEC 1.008 02/09/2017 1025   PHURINE 5.0 02/09/2017 1025   GLUCOSEU NEGATIVE 02/09/2017 1025   HGBUR MODERATE (A) 02/09/2017 1025   BILIRUBINUR NEGATIVE 02/09/2017 1025   [place] 02/09/2017 1025   PROTEINUR 100 (A) 02/09/2017 1025   NITRITE NEGATIVE 02/09/2017 1025   LEUKOCYTESUR LARGE (A) 02/09/2017 1025   Recent Results (from the past 240 hour(s))  MRSA PCR Screening     Status: None    Collection Time: 08/01/17 11:40 PM  Result Value Ref Range Status   MRSA by PCR NEGATIVE NEGATIVE Final    Comment:        The GeneXpert MRSA Assay (FDA approved for NASAL specimens only), is one component of a comprehensive MRSA colonization surveillance program. It is not intended to diagnose MRSA infection nor to guide or monitor treatment for MRSA infections.       Radiology Studies: Ct Abdomen Pelvis Wo Contrast  Result Date: 08/01/2017 CLINICAL DATA:  Dark tarry stools for 1 week fatigue and shortness of breath EXAM: CT ABDOMEN AND PELVIS WITHOUT CONTRAST TECHNIQUE: Multidetector CT imaging of the abdomen and pelvis was performed following the standard protocol without IV contrast. COMPARISON:  Report 12/28/2001 FINDINGS: Lower chest: Lung bases demonstrate no acute consolidation or pleural effusion. Coronary artery calcification. Hepatobiliary: No focal hepatic abnormality. Surgical clips at the gallbladder fossa. Prominent extrahepatic bile duct likely due to postsurgical change Pancreas: Unremarkable. No pancreatic ductal dilatation or surrounding inflammatory changes. Spleen: Normal in size without focal abnormality. Adrenals/Urinary Tract: Adrenal glands are within normal limits. Kidneys show no hydronephrosis. Probable cyst in the lower pole of left kidney. Intrarenal vascular calcifications. Small amount of air in the bladder, query any recent history of instrumentation. Stomach/Bowel: Possible wall thickening of the GE junction and proximal stomach. No dilated small bowel. Thickened appearance of the terminal ileum and ileocecal region appendix is not well seen. Vascular/Lymphatic: Extensive aortic atherosclerosis. No aneurysmal dilatation. Marked calcifications of mesenteric branch vessels. No significantly enlarged lymph nodes. Reproductive: Status post hysterectomy. No adnexal masses. Other: Negative for free air or free fluid Musculoskeletal: Degenerative changes. No acute or  suspicious bone lesion IMPRESSION: 1. Mild wall thickening of the terminal ileum and ileocecal junction. Findings could be secondary to ileitis as may be seen with infectious or inflammatory bowel disease; infiltrative mass is also in the differential. Suggest correlation with direct visualization. 2. Borderline to slight thickening of the GE junction and proximal stomach with possible gastric fold thickening. Consider gastritis versus mass; this may also be evaluated with direct visualization. 3. Extensive vascular calcifications. 4. Small amount of air in the bladder, suspect that this may be related to recent instrumentation. Electronically Signed   By: Donavan Foil M.D.   On: 08/01/2017 18:10   Dg Chest 2 View  Result Date: 08/01/2017 CLINICAL DATA:  Rectal bleeding.  Diarrhea. EXAM: CHEST  2 VIEW COMPARISON:  05/07/2004. FINDINGS: Mediastinum and hilar structures normal. Prior CABG. Heart size normal. No focal infiltrate. No pleural effusion or pneumothorax. IMPRESSION: No acute cardiopulmonary disease.  Prior CABG.  Heart size normal. Electronically Signed   By: Marcello Moores  Register   On: 08/01/2017 14:03    Scheduled Meds: . atorvastatin  80 mg Oral Q breakfast  . insulin aspart  0-15 Units Subcutaneous TID WC  . insulin glargine  20 Units Subcutaneous QHS  . levothyroxine  175 mcg Oral QAC breakfast  . metoprolol tartrate  100 mg Oral q morning - 10a   And  . metoprolol tartrate  150 mg Oral QHS  . oxyCODONE  15 mg Oral Q12H  . pantoprazole (PROTONIX) IV  40 mg Intravenous Q12H  . PARoxetine  40 mg Oral QHS   Continuous Infusions:   LOS: 1 day   Time spent: 25 minutes.  Vance Gather, MD [facility] Pager [phone]  If 7PM-7AM, please contact night-coverage [email or web address] Password [facility] 08/02/2017, 2:44 PM

## 2017-08-02 NOTE — H&P (View-Only) (Signed)
Daily Rounding Note  08/02/2017, 8:21 AM  LOS: 1 day   SUBJECTIVE:   Chief complaint: bloody stools: continue.  2 overnight.  Left abd pain improved.  No nausea or emesis.  Overall feeling better.  Thirsty but not hungry.      OBJECTIVE:         Vital signs in last 24 hours:    Temp:  [97.6 F (36.4 C)-99 F (37.2 C)] 98.2 F (36.8 C) (01/23 0735) Pulse Rate:  [56-75] 70 (01/23 0800) Resp:  [10-93] 15 (01/23 0800) BP: (97-148)/(22-93) 138/54 (01/23 0800) SpO2:  [83 %-100 %] 95 % (01/23 0800) Weight:  [76.2 kg (167 lb 15.9 oz)] 76.2 kg (167 lb 15.9 oz) (01/22 2000) Last BM Date: 08/01/17 Filed Weights   08/01/17 2000  Weight: 76.2 kg (167 lb 15.9 oz)   General: pleasant, comfortable.  Chronically ill looking   Heart: RRR Chest: clear bil.  No labored breathing.  Occasional loose-sounding cough Abdomen: soft, ND.  BS quiet.  Mild left tenderness.    Extremities: no CCE.   Neuro/Psych:  Oriented x 3.  Fully alert.  Pleasant, calm.  Moves all  4 limbs  Intake/Output from previous day: 01/22 0701 - 01/23 0700 In: 2220 [P.O.:240; I.V.:250; Blood:630; IV Piggyback:1100] Out: -   Intake/Output this shift: No intake/output data recorded.  Lab Results: Recent Labs    08/01/17 1256 08/01/17 2326 08/02/17 0343  WBC 14.0* 8.0 7.3  HGB 6.4* 8.7* 8.3*  HCT 20.8* 26.9* 25.7*  PLT 286 144* 139*   BMET Recent Labs    08/01/17 1256 08/02/17 0343  NA 136 139  K 5.9* 4.1  CL 104 109  CO2 16* 19*  GLUCOSE 276* 157*  BUN 56* 44*  CREATININE 2.17* 1.56*  CALCIUM 8.2* 7.8*   LFT Recent Labs    08/01/17 1256  PROT 5.0*  ALBUMIN 2.5*  AST 35  ALT 24  ALKPHOS 104  BILITOT 1.2   PT/INR Recent Labs    08/01/17 1434  LABPROT 14.5  INR 1.14   Hepatitis Panel No results for input(s): HEPBSAG, HCVAB, HEPAIGM, HEPBIGM in the last 72 hours.  Studies/Results: Ct Abdomen Pelvis Wo Contrast  Result Date:  08/01/2017 CLINICAL DATA:  Dark tarry stools for 1 week fatigue and shortness of breath EXAM: CT ABDOMEN AND PELVIS WITHOUT CONTRAST TECHNIQUE: Multidetector CT imaging of the abdomen and pelvis was performed following the standard protocol without IV contrast. COMPARISON:  Report 12/28/2001 FINDINGS: Lower chest: Lung bases demonstrate no acute consolidation or pleural effusion. Coronary artery calcification. Hepatobiliary: No focal hepatic abnormality. Surgical clips at the gallbladder fossa. Prominent extrahepatic bile duct likely due to postsurgical change Pancreas: Unremarkable. No pancreatic ductal dilatation or surrounding inflammatory changes. Spleen: Normal in size without focal abnormality. Adrenals/Urinary Tract: Adrenal glands are within normal limits. Kidneys show no hydronephrosis. Probable cyst in the lower pole of left kidney. Intrarenal vascular calcifications. Small amount of air in the bladder, query any recent history of instrumentation. Stomach/Bowel: Possible wall thickening of the GE junction and proximal stomach. No dilated small bowel. Thickened appearance of the terminal ileum and ileocecal region appendix is not well seen. Vascular/Lymphatic: Extensive aortic atherosclerosis. No aneurysmal dilatation. Marked calcifications of mesenteric branch vessels. No significantly enlarged lymph nodes. Reproductive: Status post hysterectomy. No adnexal masses. Other: Negative for free air or free fluid Musculoskeletal: Degenerative changes. No acute or suspicious bone lesion IMPRESSION: 1. Mild wall thickening of the terminal ileum  and ileocecal junction. Findings could be secondary to ileitis as may be seen with infectious or inflammatory bowel disease; infiltrative mass is also in the differential. Suggest correlation with direct visualization. 2. Borderline to slight thickening of the GE junction and proximal stomach with possible gastric fold thickening. Consider gastritis versus mass; this may  also be evaluated with direct visualization. 3. Extensive vascular calcifications. 4. Small amount of air in the bladder, suspect that this may be related to recent instrumentation. Electronically Signed   By: Donavan Foil M.D.   On: 08/01/2017 18:10   Dg Chest 2 View  Result Date: 08/01/2017 CLINICAL DATA:  Rectal bleeding.  Diarrhea. EXAM: CHEST  2 VIEW COMPARISON:  05/07/2004. FINDINGS: Mediastinum and hilar structures normal. Prior CABG. Heart size normal. No focal infiltrate. No pleural effusion or pneumothorax. IMPRESSION: No acute cardiopulmonary disease.  Prior CABG.  Heart size normal. Electronically Signed   By: Marcello Moores  Register   On: 08/01/2017 14:03   Scheduled Meds: . atorvastatin  80 mg Oral Q breakfast  . insulin aspart  0-15 Units Subcutaneous TID WC  . insulin glargine  20 Units Subcutaneous QHS  . levothyroxine  175 mcg Oral QAC breakfast  . metoprolol tartrate  100 mg Oral q morning - 10a   And  . metoprolol tartrate  150 mg Oral QHS  . oxyCODONE  15 mg Oral Q12H  . PARoxetine  40 mg Oral QHS   Continuous Infusions: . pantoprozole (PROTONIX) infusion 8 mg/hr (08/01/17 1443)   PRN Meds:.albuterol, ALPRAZolam, oxyCODONE-acetaminophen, promethazine   ASSESMENT:   *  GIB with maroon bloody stools, left abd pain.  Non IV contrast CT with inflammation of TI vs infiltrating mass, thickened GEJx/prox stomach, aortic vascular disease.   07/2016 surveillance colonoscopy with adenomatous and HP polyps.  Study reached normal appearing IC valve. Empiric Protonix gtt in place.    *  Acute blood loss on top of anemia of CKD.   Hgb up 2 gm after 2 U PRBC.   Macrocytosis.  Low iron and iron sat.  Normal Ferritin, B12, Folate.    *  Hyperkalemia, resolved.       *  Chronic Xarelto, last dose 1/21.  For extensive PVD requiring bil LE bypass.     *  Stage 3 CKD.     PLAN   *   EGD today at 11:45.  Switch to IV BID Protonix.  CBC in AM.     [name]  08/02/2017, 8:21  AM Pager: [phone]    Attending physician's note   I have taken an interval history, reviewed the chart and examined the patient. I agree with the Advanced Practitioner's note, impression and recommendations. Hematochezia, diarrhea, abdominal pain and anemia are all improved. CT c/w ileitis/colitis at TI and IC valve. CT also showed a thickening at EGJ and proximal stomach. EGD today to further evaluate. Ileitis/colitis likely infectious or ischemic. No plans for colonoscopy at this time.    Lucio Edward, MD [facility] [phone] Mon-Fri 8a-5p [phone] after 5p, weekends, holidays

## 2017-08-02 NOTE — Transfer of Care (Signed)
Immediate Anesthesia Transfer of Care Note  Patient: [name]  Procedure(s) Performed: ESOPHAGOGASTRODUODENOSCOPY (EGD) (N/A )  Patient Location: Endoscopy Unit  Anesthesia Type:MAC  Level of Consciousness: awake, alert , oriented and patient cooperative  Airway & Oxygen Therapy: Patient Spontanous Breathing and Patient connected to nasal cannula oxygen  Post-op Assessment: Report given to RN and Post -op Vital signs reviewed and stable  Post vital signs: Reviewed and stable  Last Vitals:  Vitals:   08/02/17 1140 08/02/17 1237  BP: (!) 148/40 107/72  Pulse: 63 65  Resp: 12 (!) 29  Temp: 36.7 C 37 C  SpO2: 100% 100%    Last Pain:  Vitals:   08/02/17 1237  TempSrc: Oral  PainSc:       Patients Stated Pain Goal: 0 (91/66/06 0045)  Complications: No apparent anesthesia complications

## 2017-08-02 NOTE — Interval H&P Note (Signed)
History and Physical Interval Note:  08/02/2017 12:06 PM  [name]  has presented today for surgery, with the diagnosis of [place] stools.  Acute on chronic anemia.  The various methods of treatment have been discussed with the patient and family. After consideration of risks, benefits and other options for treatment, the patient has consented to  Procedure(s): ESOPHAGOGASTRODUODENOSCOPY (EGD) (N/A) as a surgical intervention .  The patient's history has been reviewed, patient examined, no change in status, stable for surgery.  I have reviewed the patient's chart and labs.  Questions were answered to the patient's satisfaction.     Pricilla Riffle. Fuller Plan

## 2017-08-02 NOTE — Care Management Note (Signed)
Case Management Note  Patient Details  Name: [name] MRN: [id number] Date of Birth: 1955-08-14  Subjective/Objective:   From home , presents with GIB.                 Action/Plan: NCM will follow for dc needs.  Expected Discharge Date:  08/04/17               Expected Discharge Plan:     In-House Referral:     Discharge planning Services  CM Consult  Post Acute Care Choice:    Choice offered to:     DME Arranged:    DME Agency:     HH Arranged:    HH Agency:     Status of Service:  In process, will continue to follow  If discussed at Long Length of Stay Meetings, dates discussed:    Additional Comments:  Zenon Mayo, RN 08/02/2017, 11:11 AM

## 2017-08-03 ENCOUNTER — Encounter: Payer: Self-pay | Admitting: Gastroenterology

## 2017-08-03 DIAGNOSIS — I10 Essential (primary) hypertension: Secondary | ICD-10-CM

## 2017-08-03 DIAGNOSIS — I2510 Atherosclerotic heart disease of native coronary artery without angina pectoris: Secondary | ICD-10-CM

## 2017-08-03 DIAGNOSIS — Z794 Long term (current) use of insulin: Secondary | ICD-10-CM

## 2017-08-03 DIAGNOSIS — K922 Gastrointestinal hemorrhage, unspecified: Secondary | ICD-10-CM

## 2017-08-03 DIAGNOSIS — I739 Peripheral vascular disease, unspecified: Secondary | ICD-10-CM

## 2017-08-03 DIAGNOSIS — E1121 Type 2 diabetes mellitus with diabetic nephropathy: Secondary | ICD-10-CM

## 2017-08-03 DIAGNOSIS — N183 Chronic kidney disease, stage 3 (moderate): Secondary | ICD-10-CM

## 2017-08-03 DIAGNOSIS — Z7901 Long term (current) use of anticoagulants: Secondary | ICD-10-CM

## 2017-08-03 LAB — CBC
HEMATOCRIT: 27.2 % — AB (ref 36.0–46.0)
Hemoglobin: 8.8 g/dL — ABNORMAL LOW (ref 12.0–15.0)
MCH: 30.4 pg (ref 26.0–34.0)
MCHC: 32.4 g/dL (ref 30.0–36.0)
MCV: 94.1 fL (ref 78.0–100.0)
Platelets: 136 10*3/uL — ABNORMAL LOW (ref 150–400)
RBC: 2.89 MIL/uL — ABNORMAL LOW (ref 3.87–5.11)
RDW: 18.1 % — AB (ref 11.5–15.5)
WBC: 6.5 10*3/uL (ref 4.0–10.5)

## 2017-08-03 LAB — BASIC METABOLIC PANEL
Anion gap: 7 (ref 5–15)
BUN: 32 mg/dL — ABNORMAL HIGH (ref 6–20)
CALCIUM: 8.1 mg/dL — AB (ref 8.9–10.3)
CHLORIDE: 107 mmol/L (ref 101–111)
CO2: 22 mmol/L (ref 22–32)
CREATININE: 1.29 mg/dL — AB (ref 0.44–1.00)
GFR calc Af Amer: 50 mL/min — ABNORMAL LOW (ref >60)
GFR calc non Af Amer: 43 mL/min — ABNORMAL LOW (ref >60)
GLUCOSE: 174 mg/dL — AB (ref 65–99)
Potassium: 4.3 mmol/L (ref 3.5–5.1)
Sodium: 136 mmol/L (ref 135–145)

## 2017-08-03 LAB — GLUCOSE, CAPILLARY: Glucose-Capillary: 176 mg/dL — ABNORMAL HIGH (ref 65–99)

## 2017-08-03 MED ORDER — PANTOPRAZOLE SODIUM 40 MG PO TBEC
40.0000 mg | DELAYED_RELEASE_TABLET | Freq: Every day | ORAL | Status: DC
  Administered 2017-08-03: 40 mg via ORAL
  Filled 2017-08-03: qty 1

## 2017-08-03 NOTE — Progress Notes (Signed)
Daily Rounding Note  08/03/2017, 8:22 AM  LOS: 2 days   SUBJECTIVE:   Chief complaint: bloody stool and abd pain all resolved.  Some residual left abd TTP.  Appetite returned.  Tolerating full liquids.  No BMs since early AM yesterday.  Fells a lot better       OBJECTIVE:         Vital signs in last 24 hours:    Temp:  [98 F (36.7 C)-98.6 F (37 C)] 98 F (36.7 C) (01/24 0736) Pulse Rate:  [56-88] 56 (01/24 0736) Resp:  [12-29] 18 (01/24 0736) BP: (95-158)/(38-125) 123/64 (01/24 0736) SpO2:  [97 %-100 %] 100 % (01/24 0736) Last BM Date: 08/02/17 Filed Weights   08/01/17 2000  Weight: 76.2 kg (167 lb 15.9 oz)   General: looks better, still looks somewhat chronically unwell   Heart: RRR Chest: clear bil.  No labored breathing Abdomen: soft, NT, ND.  Active BS  Extremities: no CCE Neuro/Psych:  Oriented x 3.  No gross or focal deficits.  Calm, cooperative.    Intake/Output from previous day: 01/23 0701 - 01/24 0700 In: 200 [I.V.:200] Out: 1 [Blood:1]  Intake/Output this shift: No intake/output data recorded.  Lab Results: Recent Labs    08/01/17 2326 08/02/17 0343 08/03/17 0352  WBC 8.0 7.3 6.5  HGB 8.7* 8.3* 8.8*  HCT 26.9* 25.7* 27.2*  PLT 144* 139* 136*   BMET Recent Labs    08/01/17 1256 08/02/17 0343 08/03/17 0352  NA 136 139 136  K 5.9* 4.1 4.3  CL 104 109 107  CO2 16* 19* 22  GLUCOSE 276* 157* 174*  BUN 56* 44* 32*  CREATININE 2.17* 1.56* 1.29*  CALCIUM 8.2* 7.8* 8.1*   LFT Recent Labs    08/01/17 1256  PROT 5.0*  ALBUMIN 2.5*  AST 35  ALT 24  ALKPHOS 104  BILITOT 1.2   PT/INR Recent Labs    08/01/17 1434  LABPROT 14.5  INR 1.14   Hepatitis Panel No results for input(s): HEPBSAG, HCVAB, HEPAIGM, HEPBIGM in the last 72 hours.  Studies/Results: Ct Abdomen Pelvis Wo Contrast  Result Date: 08/01/2017 CLINICAL DATA:  Dark tarry stools for 1 week fatigue and shortness  of breath EXAM: CT ABDOMEN AND PELVIS WITHOUT CONTRAST TECHNIQUE: Multidetector CT imaging of the abdomen and pelvis was performed following the standard protocol without IV contrast. COMPARISON:  Report 12/28/2001 FINDINGS: Lower chest: Lung bases demonstrate no acute consolidation or pleural effusion. Coronary artery calcification. Hepatobiliary: No focal hepatic abnormality. Surgical clips at the gallbladder fossa. Prominent extrahepatic bile duct likely due to postsurgical change Pancreas: Unremarkable. No pancreatic ductal dilatation or surrounding inflammatory changes. Spleen: Normal in size without focal abnormality. Adrenals/Urinary Tract: Adrenal glands are within normal limits. Kidneys show no hydronephrosis. Probable cyst in the lower pole of left kidney. Intrarenal vascular calcifications. Small amount of air in the bladder, query any recent history of instrumentation. Stomach/Bowel: Possible wall thickening of the GE junction and proximal stomach. No dilated small bowel. Thickened appearance of the terminal ileum and ileocecal region appendix is not well seen. Vascular/Lymphatic: Extensive aortic atherosclerosis. No aneurysmal dilatation. Marked calcifications of mesenteric branch vessels. No significantly enlarged lymph nodes. Reproductive: Status post hysterectomy. No adnexal masses. Other: Negative for free air or free fluid Musculoskeletal: Degenerative changes. No acute or suspicious bone lesion IMPRESSION: 1. Mild wall thickening of the terminal ileum and ileocecal junction. Findings could be secondary to ileitis as may be seen  with infectious or inflammatory bowel disease; infiltrative mass is also in the differential. Suggest correlation with direct visualization. 2. Borderline to slight thickening of the GE junction and proximal stomach with possible gastric fold thickening. Consider gastritis versus mass; this may also be evaluated with direct visualization. 3. Extensive vascular  calcifications. 4. Small amount of air in the bladder, suspect that this may be related to recent instrumentation. Electronically Signed   By: Donavan Foil M.D.   On: 08/01/2017 18:10   Dg Chest 2 View  Result Date: 08/01/2017 CLINICAL DATA:  Rectal bleeding.  Diarrhea. EXAM: CHEST  2 VIEW COMPARISON:  05/07/2004. FINDINGS: Mediastinum and hilar structures normal. Prior CABG. Heart size normal. No focal infiltrate. No pleural effusion or pneumothorax. IMPRESSION: No acute cardiopulmonary disease.  Prior CABG.  Heart size normal. Electronically Signed   By: Marcello Moores  Register   On: 08/01/2017 14:03   Scheduled Meds: . atorvastatin  80 mg Oral Q breakfast  . insulin aspart  0-20 Units Subcutaneous TID WC  . insulin aspart  0-5 Units Subcutaneous QHS  . insulin glargine  20 Units Subcutaneous QHS  . levothyroxine  175 mcg Oral QAC breakfast  . metoprolol tartrate  100 mg Oral q morning - 10a   And  . metoprolol tartrate  150 mg Oral QHS  . oxyCODONE  15 mg Oral Q12H  . PARoxetine  40 mg Oral QHS   Continuous Infusions: PRN Meds:.albuterol, ALPRAZolam, hydrALAZINE, oxyCODONE-acetaminophen, promethazine   ASSESMENT:   *  GIB: maroon, bloody stools, left abd pain.   Thickened GE Jx/prox stomach per CT.  1/23 EGD: Nodular gastritis.   Terminal ileal thickening per CT, suspect ischemic etiology.  Doubt infiltrating process/malignancy given unremarkable cecum on colonoscopy 07/2016.  No plans to repeat colonoscopy.    *  ASPVD.  S/p bil LE bypass.  Chronic Xarelto, 81 ASA on hold.    *  Acute blood loss anemia on top of chronic, previously iron def anemia.  hgb stable after 2 U PRBC 2 days ago.  On oral iron TID at home and parenteral iron given 03/2017  *  AKI, CKD ~ stage 3  *  Non-critical thrombocytopenia.  Dates to 11/2016.     PLAN   *  Switch IV Protonix po 40 mg /day .  Resume Omeprazole 40 mg/day at d/c.   Resume Xarelto, 81 ASA on 1/27.   Await gastric bx path report   *  OK  to discharge today.  Diet diabetic, HH.  ROV with GI: Dr Henrene Pastor 2/21 at 9 AM.  If having ongoing sxs, could consider repeating colonoscopy.  Unfortunately CT enterography not an option given her CKD.     Azucena Freed  08/03/2017, 8:22 AM Pager: [phone]    Attending physician's note   I have taken an interval history, reviewed the chart and examined the patient. I agree with the Advanced Practitioner's note, impression and recommendations.   Lucio Edward, MD [facility] [phone] Mon-Fri 8a-5p [phone] after 5p, weekends, holidays

## 2017-08-03 NOTE — Discharge Summary (Signed)
Physician Discharge Summary  [name] PQZ:[id number] DOB: [birth year] DOA: 08/01/2017  PCP: Nicholes Rough, PA-C  Admit date: 08/01/2017 Discharge date: 08/03/2017  Admitted From: Home Disposition: Home   Recommendations for Outpatient Follow-up:  1. Follow up with PCP in 1-2 weeks 2. Follow up with GI, Dr. Henrene Pastor, 2/21 3. Follow up with vascular surgery, Dr. Fletcher Anon in 1 - 2 weeks, consider alternative to xarelto (restart the low dose on 1/27 per GI recommendations) 4. Please obtain BMP/CBC in one week  Home Health: None Equipment/Devices: None Discharge Condition: Stable CODE STATUS: Full Diet recommendation: Carb-modified, heart healthy  Brief/Interim Summary: [name] is a 62 y.o. female with a history of PVD, CAD, carotid stenosis recently started on xarelto who presented to the ED on the advice of her PCP on 1/22 due to anemia in the setting of ~1 week of diarrhea that became maroon in color, then with red blood in loose stools, and became associated with lightheadedness, weakness. She was noted to have bloody stool with clots in the ED. Initial BP was 97/43 improved with1L IVF. GI consulted, recommended transfusion, IV PPI, CT abd/pelvis which was consistent with colitis/ileitis (ischemic vs. infectious) and thickening of the GE junction. Hgb improved 6.4 to 8.7 as xarelto was held. EGD 1/23 showed nodular gastritis without other source of bleeding. Hemoglobin remained stable the next day, 8.8g/dl, with resolution of diarrhea and no further bleeding. She is stable for discharge.   Discharge Diagnoses:  Principal Problem:   GIB (gastrointestinal bleeding) Active Problems:   DM2 (diabetes mellitus, type 2) (HCC)   Hyperlipidemia   ANXIETY DEPRESSION   Essential hypertension   Coronary atherosclerosis   Peripheral vascular disease (HCC)   Diverticulosis of colon   Abnormal CT scan, esophagus   Hematochezia  Acute blood loss anemia on chronic iron deficiency anemia: Due to  hematochezia, possibly from ileitis.  - Restart xarelto (last dose 1/21) on chronic dosing 1/27 if bleeding remains resolved.  - Recheck CBC at follow up - Recommend repeat anemia panel after recovery.   Ischemic colitis: Most likely ischemic given her risk factors.  - Advance diet as tolerated, supportive therapy.  - Follow up with GI, Dr. Scarlette Shorts 2/21 at 9am.  Could consider colonoscopy if ongoing symptoms.  Nodular gastritis:  - Continue PPI po daily - Follow up gastric biopsy pathology.   AKI on Stage III CKD: Baseline SCr felt to be closer to 1.2-1.3 consistent with CrCl >82m/min.  Cr 2.17 on arrival, improved with fluids/PRBCs.  - May need to consider coumadin vs. stopping anticoagulation over DOAC if bleeding returns.  - Recheck at follow up - Renally dose medications - Avoiding nephrotoxins.   Severe PVD: With claudication s/p left external iliac common femoral profunda endarterectomy with profundoplasty and left femoral to below-knee popliteal artery bypass May 2018 and right fem-pop bypass Aug 2018.  - Recently switched to xarelto 2.529mpo BID by vascular surgeon which is held. Will need early follow up with Dr. ArFletcher Anon CAD s/p CABG 2004: No chest pain. ECG personally reviewed: Sinus bradycardia with normal axis, no ischemic changes. Neg troponin.  - Continue beta blocker - Restart home medications at discharge.   IDT2DM: HbA1c 7.3%.  - Continue home regimen.   Hypothyroidism:  - Continue synthroid.  - No recent TSH, recommend recheck if not recently checked outside system following recovery from acute illness.   Asymptomatic bacteriuria: No treatment indicated.   Chronic pain:  - Continue home oxycontin, oxycodone.  -  Discontinue NSAIDs  Depression: Chronic, stable - Continue home meds, xanax home doses - Note pt has sertraline and paroxetine on med list. Please check for discrepancy.   Hyperkalemia: Resolved. Suspect spurious result.   Discharge  Instructions Discharge Instructions    Diet - low sodium heart healthy   Complete by:  As directed    Discharge instructions   Complete by:  As directed    STOP taking aspirin and xarelto until 1/27. If bleeding returns, stop these medications and seek medical attention right away.  - Follow up with Dr. Fletcher Anon and your PCP soon.  - Follow up with GI, Dr. Henrene Pastor as scheduled below - Continue taking omeprazole 14m daily.   Increase activity slowly   Complete by:  As directed      Allergies as of 08/03/2017   No Known Allergies     Medication List    TAKE these medications   albuterol 108 (90 Base) MCG/ACT inhaler Commonly known as:  PROVENTIL HFA;VENTOLIN HFA Inhale 2 puffs into the lungs every 6 (six) hours as needed for wheezing or shortness of breath.   ALPRAZolam 1 MG tablet Commonly known as:  XANAX Take 1 mg by mouth 2 (two) times daily as needed for anxiety.   amLODipine 5 MG tablet Commonly known as:  NORVASC Take 1 tablet (5 mg total) by mouth at bedtime.   aspirin EC 81 MG tablet Take 81 mg by mouth at bedtime.   atorvastatin 80 MG tablet Commonly known as:  LIPITOR Take 80 mg by mouth every morning.   B-D INS SYR ULTRAFINE 1CC/31G 31G X 5/16" 1 ML Misc Generic drug:  Insulin Syringe-Needle U-100 USE NEW NEEDLE FOR MEAL TIME 3 TIMES A DAY AND LONG ACTING INSULIN TWICE A DAY   BASAGLAR KWIKPEN 100 UNIT/ML Sopn Inject 20-60 Units into the skin 2 (two) times daily before a meal. Sliding Scale   bismuth subsalicylate 2876MOT/15BWsuspension Commonly known as:  PEPTO BISMOL Take 30 mLs by mouth every 6 (six) hours as needed for diarrhea or loose stools.   calcitRIOL 0.25 MCG capsule Commonly known as:  ROCALTROL Take 0.25 mcg by mouth 3 (three) times a week. Monday, Wednesday, Friday   cetirizine 10 MG tablet Commonly known as:  ZYRTEC Take 10 mg by mouth daily.   fenofibrate micronized 200 MG capsule Commonly known as:  LOFIBRA Take 200 mg by mouth at  bedtime.   ferrous sulfate 325 (65 FE) MG tablet Take 325 mg by mouth 3 (three) times daily.   Fish Oil 1000 MG Caps Take 1,000 mg by mouth 2 (two) times daily.   furosemide 40 MG tablet Commonly known as:  LASIX Take 40 mg by mouth 2 (two) times daily.   levothyroxine 175 MCG tablet Commonly known as:  SYNTHROID, LEVOTHROID Take 175 mcg by mouth daily before breakfast.   loperamide 2 MG capsule Commonly known as:  IMODIUM Take 4 mg by mouth as needed for diarrhea or loose stools.   metoprolol tartrate 50 MG tablet Commonly known as:  LOPRESSOR Take 100-150 mg by mouth See admin instructions. Takes 100 mg in the morning and 155min the evening.   NARCAN 4 MG/0.1ML Liqd nasal spray kit Generic drug:  naloxone Place 1 spray into both nostrils See admin instructions. As needed in case of an overdose   omeprazole 40 MG capsule Commonly known as:  PRILOSEC Take 40 mg by mouth daily.   ONETOUCH VERIO test strip Generic drug:  glucose blood  CHECK SUGAR AT HOME 4-5 TIMES A DAY   oxyCODONE 15 mg 12 hr tablet Commonly known as:  OXYCONTIN Take 15 mg by mouth every 12 (twelve) hours.   oxyCODONE-acetaminophen 5-325 MG tablet Commonly known as:  PERCOCET/ROXICET Take 1 tablet by mouth every 8 (eight) hours as needed for severe pain.   PARoxetine 40 MG tablet Commonly known as:  PAXIL Take 40 mg by mouth at bedtime.   promethazine 25 MG tablet Commonly known as:  PHENERGAN Take 25 mg by mouth daily as needed for nausea or vomiting.   rivaroxaban 2.5 MG Tabs tablet Commonly known as:  XARELTO Take 1 tablet (2.5 mg total) by mouth 2 (two) times daily.   sertraline 100 MG tablet Commonly known as:  ZOLOFT Take 100 mg by mouth daily.   Vitamin D (Ergocalciferol) 50000 units Caps capsule Commonly known as:  DRISDOL Take 50,000 Units by mouth once a week. mondays   VOLTAREN 1 % Gel Generic drug:  diclofenac sodium APPLY TO AFFECTED AREA 4 GRAMS 3 TIMES DAILY AS  NEEDED      Follow-up Information    Irene Shipper, MD Follow up on 08/31/2017.   Specialty:  Gastroenterology Why:  9 AM.   Contact information: [address] [address] [phone]        Nicholes Rough, PA-C. Schedule an appointment as soon as possible for a visit in 2 week(s).   Specialty:  Physician Assistant Why:  with repeat labs Contact information: [address] [place] [address] [phone]        Wellington Hampshire, MD. Schedule an appointment as soon as possible for a visit in 1 week(s).   Specialty:  Cardiology Contact information: [address] [place] [address] [phone]          No Known Allergies  Consultations:  GI  Procedures/Studies: Ct Abdomen Pelvis Wo Contrast  Result Date: 08/01/2017 CLINICAL DATA:  Dark tarry stools for 1 week fatigue and shortness of breath EXAM: CT ABDOMEN AND PELVIS WITHOUT CONTRAST TECHNIQUE: Multidetector CT imaging of the abdomen and pelvis was performed following the standard protocol without IV contrast. COMPARISON:  Report 12/28/2001 FINDINGS: Lower chest: Lung bases demonstrate no acute consolidation or pleural effusion. Coronary artery calcification. Hepatobiliary: No focal hepatic abnormality. Surgical clips at the gallbladder fossa. Prominent extrahepatic bile duct likely due to postsurgical change Pancreas: Unremarkable. No pancreatic ductal dilatation or surrounding inflammatory changes. Spleen: Normal in size without focal abnormality. Adrenals/Urinary Tract: Adrenal glands are within normal limits. Kidneys show no hydronephrosis. Probable cyst in the lower pole of left kidney. Intrarenal vascular calcifications. Small amount of air in the bladder, query any recent history of instrumentation. Stomach/Bowel: Possible wall thickening of the GE junction and proximal stomach. No dilated small bowel. Thickened appearance of the terminal ileum and ileocecal region appendix is not  well seen. Vascular/Lymphatic: Extensive aortic atherosclerosis. No aneurysmal dilatation. Marked calcifications of mesenteric branch vessels. No significantly enlarged lymph nodes. Reproductive: Status post hysterectomy. No adnexal masses. Other: Negative for free air or free fluid Musculoskeletal: Degenerative changes. No acute or suspicious bone lesion IMPRESSION: 1. Mild wall thickening of the terminal ileum and ileocecal junction. Findings could be secondary to ileitis as may be seen with infectious or inflammatory bowel disease; infiltrative mass is also in the differential. Suggest correlation with direct visualization. 2. Borderline to slight thickening of the GE junction and proximal stomach with possible gastric fold thickening. Consider gastritis versus mass; this may  also be evaluated with direct visualization. 3. Extensive vascular calcifications. 4. Small amount of air in the bladder, suspect that this may be related to recent instrumentation. Electronically Signed   By: Donavan Foil M.D.   On: 08/01/2017 18:10   Dg Chest 2 View  Result Date: 08/01/2017 CLINICAL DATA:  Rectal bleeding.  Diarrhea. EXAM: CHEST  2 VIEW COMPARISON:  05/07/2004. FINDINGS: Mediastinum and hilar structures normal. Prior CABG. Heart size normal. No focal infiltrate. No pleural effusion or pneumothorax. IMPRESSION: No acute cardiopulmonary disease.  Prior CABG.  Heart size normal. Electronically Signed   By: Marcello Moores  Register   On: 08/01/2017 14:03     EGD 08/02/2016 Dr. Fuller Plan:  The examined esophagus was normal. Localized moderate inflammation characterized by erosions, erythema,  nodules and granularity was found in the gastric antrum. Biopsies were  taken with a cold forceps for histology. The exam of the stomach was otherwise normal. The cardia and gastric fundus were normal on retroflexion. The duodenal bulb and second portion of the duodenum were normal. Impression:   - Normal esophagus. - Nodular gastritis. Biopsied. - Normal duodenal bulb and second portion of the duodenum.  Recommendation:  - Return patient to hospital ward for ongoing care. - Resume previous diet. - Continue present medications. - Await pathology results. - Resume Xarelto (rivaroxaban) at prior dose in 4 days. Refer to managing physician for further adjustment of therapy. - Outpatient GI follow up with Dr. Scarlette Shorts. - Protonix (pantoprazole) 40 mg PO daily.  Subjective: Feels well, no more bleeding. Abdominal pain significantly improved, tolerating diet without N/V. Wants to go home.   Discharge Exam: Vitals:   08/03/17 0736 08/03/17 0837  BP: 123/64 109/88  Pulse: (!) 56 61  Resp: 18   Temp: 98 F (36.7 C)   SpO2: 100%    General: Pt is alert, awake, not in acute distress Cardiovascular: RRR, S1/S2 +, no rubs, no gallops Respiratory: CTA bilaterally, no wheezing, no rhonchi Abdominal: Soft, very mild tenderness in left lower quadrant without rebound or guarding, ND, bowel sounds + Extremities: No edema, no cyanosis  Labs: BNP (last 3 results) No results for input(s): BNP in the last 8760 hours. Basic Metabolic Panel: Recent Labs  Lab 08/01/17 1256 08/02/17 0343 08/03/17 0352  NA 136 139 136  K 5.9* 4.1 4.3  CL 104 109 107  CO2 16* 19* 22  GLUCOSE 276* 157* 174*  BUN 56* 44* 32*  CREATININE 2.17* 1.56* 1.29*  CALCIUM 8.2* 7.8* 8.1*   Liver Function Tests: Recent Labs  Lab 08/01/17 1256  AST 35  ALT 24  ALKPHOS 104  BILITOT 1.2  PROT 5.0*  ALBUMIN 2.5*   No results for input(s): LIPASE, AMYLASE in the last 168 hours. No results for input(s): AMMONIA in the last 168 hours. CBC: Recent Labs  Lab 08/01/17 1256 08/01/17 2326 08/02/17 0343 08/03/17 0352  WBC 14.0* 8.0 7.3 6.5  HGB 6.4* 8.7* 8.3* 8.8*  HCT 20.8* 26.9* 25.7* 27.2*  MCV 102.0* 93.1 92.8 94.1  PLT 286 144* 139* 136*   Cardiac Enzymes: Recent Labs   Lab 08/01/17 1256  TROPONINI <0.03   BNP: Invalid input(s): POCBNP CBG: Recent Labs  Lab 08/01/17 2207 08/02/17 0734 08/02/17 1308 08/02/17 1628 08/02/17 2046  GLUCAP 179* 168* 145* 317* 277*   D-Dimer No results for input(s): DDIMER in the last 72 hours. Hgb A1c No results for input(s): HGBA1C in the last 72 hours. Lipid Profile No results for input(s): CHOL, HDL, LDLCALC,  TRIG, CHOLHDL, LDLDIRECT in the last 72 hours. Thyroid function studies No results for input(s): TSH, T4TOTAL, T3FREE, THYROIDAB in the last 72 hours.  Invalid input(s): FREET3 Anemia work up Recent Labs    08/01/17 1551  VITAMINB12 262  FOLATE 12.2  FERRITIN 50  TIBC 294  IRON 21*  RETICCTPCT 16.0*   Urinalysis    Component Value Date/Time   COLORURINE YELLOW 02/09/2017 1025   APPEARANCEUR CLOUDY (A) 02/09/2017 1025   LABSPEC 1.008 02/09/2017 1025   PHURINE 5.0 02/09/2017 1025   GLUCOSEU NEGATIVE 02/09/2017 1025   HGBUR MODERATE (A) 02/09/2017 1025   BILIRUBINUR NEGATIVE 02/09/2017 1025   [place] 02/09/2017 1025   PROTEINUR 100 (A) 02/09/2017 1025   NITRITE NEGATIVE 02/09/2017 1025   LEUKOCYTESUR LARGE (A) 02/09/2017 1025    Microbiology Recent Results (from the past 240 hour(s))  MRSA PCR Screening     Status: None   Collection Time: 08/01/17 11:40 PM  Result Value Ref Range Status   MRSA by PCR NEGATIVE NEGATIVE Final    Comment:        The GeneXpert MRSA Assay (FDA approved for NASAL specimens only), is one component of a comprehensive MRSA colonization surveillance program. It is not intended to diagnose MRSA infection nor to guide or monitor treatment for MRSA infections.     Time coordinating discharge: Approximately 40 minutes  Vance Gather, MD  [facility] 08/03/2017, 9:30 AM Pager [phone]

## 2017-08-03 NOTE — Progress Notes (Signed)
Patient discharged to home with all belongings, patient has no c/o pain or distress. VSS. Patient verbalized understandings in regards to discharge instructions.

## 2017-08-04 ENCOUNTER — Other Ambulatory Visit (HOSPITAL_COMMUNITY): Payer: Self-pay | Admitting: *Deleted

## 2017-08-07 ENCOUNTER — Ambulatory Visit (HOSPITAL_COMMUNITY): Admission: RE | Admit: 2017-08-07 | Payer: Medicare Other | Source: Ambulatory Visit

## 2017-08-10 NOTE — Anesthesia Postprocedure Evaluation (Signed)
Anesthesia Post Note  Patient: [name]  Procedure(s) Performed: ESOPHAGOGASTRODUODENOSCOPY (EGD) (N/A )     Patient location during evaluation: PACU Anesthesia Type: MAC Level of consciousness: awake and alert Pain management: pain level controlled Vital Signs Assessment: post-procedure vital signs reviewed and stable Respiratory status: spontaneous breathing, nonlabored ventilation, respiratory function stable and patient connected to nasal cannula oxygen Cardiovascular status: stable and blood pressure returned to baseline Postop Assessment: no apparent nausea or vomiting Anesthetic complications: no    Last Vitals:  Vitals:   08/03/17 0736 08/03/17 0837  BP: 123/64 109/88  Pulse: (!) 56 61  Resp: 18   Temp: 36.7 C   SpO2: 100%     Last Pain:  Vitals:   08/03/17 0741  TempSrc:   PainSc: [place]

## 2017-08-11 ENCOUNTER — Ambulatory Visit (HOSPITAL_COMMUNITY)
Admission: RE | Admit: 2017-08-11 | Discharge: 2017-08-11 | Disposition: A | Discharge status: Home / Self Care | Payer: Medicare Other | Source: Ambulatory Visit | Attending: Cardiology | Admitting: Cardiology

## 2017-08-11 DIAGNOSIS — I6523 Occlusion and stenosis of bilateral carotid arteries: Secondary | ICD-10-CM

## 2017-08-14 ENCOUNTER — Ambulatory Visit: Payer: Medicare Other | Admitting: Physical Therapy

## 2017-08-15 ENCOUNTER — Encounter: Payer: Self-pay | Admitting: Cardiovascular Disease

## 2017-08-15 ENCOUNTER — Ambulatory Visit (INDEPENDENT_AMBULATORY_CARE_PROVIDER_SITE_OTHER): Payer: Medicare Other | Admitting: Cardiovascular Disease

## 2017-08-15 VITALS — BP 133/55 | HR 56 | Ht 63.0 in | Wt 172.0 lb

## 2017-08-15 DIAGNOSIS — E7800 Pure hypercholesterolemia, unspecified: Secondary | ICD-10-CM | POA: Diagnosis not present

## 2017-08-15 DIAGNOSIS — I6523 Occlusion and stenosis of bilateral carotid arteries: Secondary | ICD-10-CM

## 2017-08-15 DIAGNOSIS — I10 Essential (primary) hypertension: Secondary | ICD-10-CM | POA: Diagnosis not present

## 2017-08-15 DIAGNOSIS — I779 Disorder of arteries and arterioles, unspecified: Secondary | ICD-10-CM

## 2017-08-15 DIAGNOSIS — I2510 Atherosclerotic heart disease of native coronary artery without angina pectoris: Secondary | ICD-10-CM | POA: Diagnosis not present

## 2017-08-15 DIAGNOSIS — I739 Peripheral vascular disease, unspecified: Secondary | ICD-10-CM | POA: Diagnosis not present

## 2017-08-15 NOTE — Progress Notes (Signed)
Cardiology Office Note   Date:  08/15/2017   ID:  [name], DOB [birth year], MRN [id number]  PCP:  Nicholes Rough, PA-C  Cardiologist:   Dr. Stanford Breed  Chief Complaint  Patient presents with  . Follow-up    PAD      History of Present Illness: [name] is a 62 y.o. female who presents for a followup visit regarding peripheral arterial disease. She has known history of  coronary artery status post coronary bypass graft surgery in 2004. She is known to have moderate bilateral carotid stenosis.  She also has prolonged history of diabetes with  chronic kidney disease and  diabetic neuropathy. 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  She is s/p left external iliac/common femoral and profunda endarterectomy with extended profundoplasty, ligation of the left superficial femoral artery and femoral to below the knee popliteal artery bypass with a vein graft in 05/18 and more recently right femoral to below-knee popliteal artery bypass in August.  Revascularization was performed for critical limb ischemia.  During last visit, I started on small dose Xarelto given peripheral arterial disease and coronary artery disease.  She had lower GI bleed and was hospitalized with a hemoglobin of 6.5.  EGD was unremarkable.  He did not undergo colonoscopy.  Improved with transfusion.  No further bleeding.  She has been doing reasonably well with no leg claudication.  She resumed taking cilostazol instead of Xarelto.  Past Medical History:  Diagnosis Date  . Adenomatous polyp of colon   . Anemia   . Arthritis   . Asthma   . Carotid artery disease ([place])   . Carpal tunnel syndrome   . Chronic renal insufficiency    Stage 3 Kidney disease  . Coronary artery disease   . Depression   . Depression with anxiety   . Diabetes mellitus    type II  . Diverticulosis of colon (without mention of hemorrhage)   . GERD (gastroesophageal reflux disease)   .  Heart murmur   . History of hiatal hernia   . Hyperlipidemia   . Hypertension   . Hypothyroidism   . Obesity   . PVD (peripheral vascular disease) ([place])   . Thyroid disease    hypo    Past Surgical History:  Procedure Laterality Date  . ABDOMINAL AORTAGRAM N/A 12/18/2013   Procedure: ABDOMINAL [name];  Surgeon: Wellington Hampshire, MD;  Location: [place] CATH [facility];  Service: Cardiovascular;  Laterality: N/A;  . ABDOMINAL AORTOGRAM W/LOWER EXTREMITY N/A 11/16/2016   Procedure: Abdominal Aortogram w/Lower Extremity;  Surgeon: Wellington Hampshire, MD;  Location: [place] [facility];  Service: Cardiovascular;  Laterality: N/A;  . ABDOMINAL AORTOGRAM W/LOWER EXTREMITY N/A 01/25/2017   Procedure: Abdominal Aortogram w/Lower Extremity;  Surgeon: Wellington Hampshire, MD;  Location: [place] [facility];  Service: Cardiovascular;  Laterality: N/A;  only completed Lower Extremity  . ABDOMINAL HYSTERECTOMY    . APPENDECTOMY    . APPLICATION OF WOUND VAC Right 02/13/2017   Procedure: APPLICATION OF INCISIONAL WOUND VAC RIGHT GROIN;  Surgeon: Elam Dutch, MD;  Location: [place];  Service: Vascular;  Laterality: Right;  . CHOLECYSTECTOMY    . CORONARY ARTERY BYPASS GRAFT  2004   x4  . ENDARTERECTOMY FEMORAL Left 11/23/2016   Procedure: ENDARTERECTOMY OF LEFT EXTERNAL COMMON FEMORAL ARTERY WITH EXTENDED LEFT PROFUNDOPLASTY;  Surgeon: Elam Dutch, MD;  Location: [place];  Service: Vascular;  Laterality: Left;  . ENDARTERECTOMY FEMORAL Right 02/13/2017   Procedure: ENDARTERECTOMY RIGHT FEMORAL ARTERY WITH PROFUNDAPLASTY;  Surgeon: Elam Dutch, MD;  Location: [facility] OR;  Service: Vascular;  Laterality: Right;  . ESOPHAGOGASTRODUODENOSCOPY N/A 08/02/2017   Procedure: ESOPHAGOGASTRODUODENOSCOPY (EGD);  Surgeon: Ladene Artist, MD;  Location: [facility] ENDOSCOPY;  Service: Endoscopy;  Laterality: N/A;  . FEMORAL-POPLITEAL BYPASS GRAFT Left 11/23/2016   Procedure: LEFT FEMORAL-BELOW KNEE POPLITEAL ARTERY BYPASS;  Surgeon:  Elam Dutch, MD;  Location: [place];  Service: Vascular;  Laterality: Left;  . FEMORAL-POPLITEAL BYPASS GRAFT Right 02/13/2017   Procedure: BYPASS GRAFT RIGHT FEMORAL-BELOW KNEE POPLITEAL ARTERY;  Surgeon: Elam Dutch, MD;  Location: [place];  Service: Vascular;  Laterality: Right;  . KNEE SURGERY Right 2013   arthroscopy  . PATCH ANGIOPLASTY Right 02/13/2017   Procedure: VEIN PATCH ANGIOPLASTY RIGHT POPLITEAL ARTERY AND HEMASHIELD PATCH ANGIOPLASTY OF RIGHT FEMORAL ARTERY;  Surgeon: Elam Dutch, MD;  Location: [facility] OR;  Service: Vascular;  Laterality: Right;     Current Outpatient Medications  Medication Sig Dispense Refill  . albuterol (PROVENTIL HFA;VENTOLIN HFA) 108 (90 BASE) MCG/ACT inhaler Inhale 2 puffs into the lungs every 6 (six) hours as needed for wheezing or shortness of breath.     . ALPRAZolam (XANAX) 1 MG tablet Take 1 mg by mouth 2 (two) times daily as needed for anxiety.     Marland Kitchen amLODipine (NORVASC) 5 MG tablet Take 1 tablet (5 mg total) by mouth at bedtime. 90 tablet 3  . aspirin EC 81 MG tablet Take 81 mg by mouth at bedtime.    Marland Kitchen atorvastatin (LIPITOR) 80 MG tablet Take 80 mg by mouth every morning.     . B-D INS SYR ULTRAFINE 1CC/31G 31G X 5/16" 1 ML MISC USE NEW NEEDLE FOR MEAL TIME 3 TIMES A DAY AND LONG ACTING INSULIN TWICE A DAY  11  . calcitRIOL (ROCALTROL) 0.25 MCG capsule Take 0.25 mcg by mouth 3 (three) times a week. Monday, Wednesday, Friday  6  . cetirizine (ZYRTEC) 10 MG tablet Take 10 mg by mouth daily.  6  . fenofibrate micronized (LOFIBRA) 200 MG capsule Take 200 mg by mouth at bedtime.     . ferrous sulfate 325 (65 FE) MG tablet Take 325 mg by mouth 3 (three) times daily.  3  . furosemide (LASIX) 40 MG tablet Take 40 mg by mouth 2 (two) times daily.     . Insulin Glargine (BASAGLAR KWIKPEN) 100 UNIT/ML SOPN Inject 20-60 Units into the skin 2 (two) times daily before a meal. Sliding Scale  3  . levothyroxine (SYNTHROID, LEVOTHROID) 175 MCG tablet Take  175 mcg by mouth daily before breakfast.     . loperamide (IMODIUM) 2 MG capsule Take 4 mg by mouth as needed for diarrhea or loose stools.    . metoprolol (LOPRESSOR) 50 MG tablet Take 100-150 mg by mouth See admin instructions. Takes 100 mg in the morning and 131m in the evening.  6  . NARCAN 4 MG/0.1ML LIQD nasal spray kit Place 1 spray into both nostrils See admin instructions. As needed in case of an overdose  0  . Omega-3 Fatty Acids (FISH OIL) 1000 MG CAPS Take 1,000 mg by mouth 2 (two) times daily.    .Marland Kitchenomeprazole (PRILOSEC) 40 MG capsule Take 40 mg by mouth daily.    .Glory RosebushVERIO test strip CHECK SUGAR AT HOME 4-5 TIMES A DAY  11  . oxyCODONE (OXYCONTIN) 15 mg 12  hr tablet Take 15 mg by mouth every 12 (twelve) hours.    Marland Kitchen oxyCODONE-acetaminophen (PERCOCET/ROXICET) 5-325 MG tablet Take 1 tablet by mouth every 8 (eight) hours as needed for severe pain. 14 tablet 0  . PARoxetine (PAXIL) 40 MG tablet Take 40 mg by mouth at bedtime.     . promethazine (PHENERGAN) 25 MG tablet Take 25 mg by mouth daily as needed for nausea or vomiting.   6  . rivaroxaban (XARELTO) 2.5 MG TABS tablet Take 1 tablet (2.5 mg total) by mouth 2 (two) times daily. 60 tablet 6  . sertraline (ZOLOFT) 100 MG tablet Take 100 mg by mouth daily.    . Vitamin D, Ergocalciferol, (DRISDOL) 50000 UNITS CAPS capsule Take 50,000 Units by mouth once a week. mondays  1  . VOLTAREN 1 % GEL APPLY TO AFFECTED AREA 4 GRAMS 3 TIMES DAILY AS NEEDED  1   No current facility-administered medications for this visit.     Allergies:   Patient has no known allergies.    Social History:  The patient  reports that she quit smoking about 15 years ago. Her smoking use included cigarettes. She has a 30.00 pack-year smoking history. she has never used smokeless tobacco. She reports that she does not drink alcohol or use drugs.   Family History:  The patient's family history includes Colon polyps in her daughter; Diabetes in her mother;  Heart disease in her father.    ROS:  Please see the history of present illness.   Otherwise, review of systems are positive for none.   All other systems are reviewed and negative.    PHYSICAL EXAM: VS:  BP (!) 133/55   Pulse (!) 56   Ht '5\' 3"'  (1.6 m)   Wt 172 lb (78 kg)   BMI 30.47 kg/m  , BMI Body mass index is 30.47 kg/m. GEN: Well nourished, well developed, in no acute distress  HEENT: normal  Neck: no JVD,  or masses. Bilateral carotid bruits. Cardiac: RRR; no rubs, or gallops,no edema . 2/6 systolic ejection murmur in the aortic area. Respiratory:  clear to auscultation bilaterally, normal work of breathing GI: soft, nontender, nondistended, + BS MS: no deformity or atrophy  Skin: warm and dry, no rash Neuro:  Strength and sensation are intact Psych: euthymic mood, full affect  EKG:  EKG is ordered today. EKG showed normal sinus rhythm with nonspecific T wave changes.    Recent Labs: 08/01/2017: ALT 24 08/03/2017: BUN 32; Creatinine, Ser 1.29; Hemoglobin 8.8; Platelets 136; Potassium 4.3; Sodium 136    Lipid Panel No results found for: CHOL, TRIG, HDL, CHOLHDL, VLDL, LDLCALC, LDLDIRECT    Wt Readings from Last 3 Encounters:  08/15/17 172 lb (78 kg)  08/01/17 167 lb 15.9 oz (76.2 kg)  06/29/17 170 lb (77.1 kg)         ASSESSMENT AND PLAN:  1. Peripheral arterial disease status post successful surgical revascularization of bilateral lower extremities.  She is doing well overall.  Continue low-dose aspirin.  2. Coronary artery disease: She has no chest pain. Stable exertional dyspnea.  3. Essential hypertension: Blood pressure is controlled on current medications  4. Hyperlipidemia: Currently on atorvastatin and fenofibrate.  5. Bilateral carotid stenosis: Recent carotid Doppler showed stable moderate left ICA stenosis.  Repeat study in 1 year.  6.  Blood loss anemia: No plans to resume Xarelto at the present time.  I also instructed her to stop taking  cilostazol to minimize the risk of  bleeding for now.  We can reevaluate this see if any additional antiplatelet or antithrombotic treatment is needed.  Disposition:   FU with me in 6 months  Signed,  [name], MD  08/15/2017 11:16 AM    [place]

## 2017-08-15 NOTE — Patient Instructions (Signed)
Medication Instructions: Do not restart the Xarelto. STOP the Pletal  If you need a refill on your cardiac medications before your next appointment, please call your pharmacy.    Follow-Up: Your physician wants you to follow-up in: 6 months with Dr. Fletcher Anon. You will receive a reminder letter in the mail two months in advance. If you don't receive a letter, please call our office at [phone] to schedule this follow-up appointment.    Thank you for choosing [facility]!!

## 2017-08-16 ENCOUNTER — Telehealth: Payer: Self-pay | Admitting: *Deleted

## 2017-08-16 DIAGNOSIS — I6523 Occlusion and stenosis of bilateral carotid arteries: Secondary | ICD-10-CM

## 2017-08-16 NOTE — Telephone Encounter (Signed)
-----   Message from Wellington Hampshire, MD sent at 08/16/2017  3:01 PM EST ----- Stable moderate left carotid disease. I already informed the patient during office visit. Recommend repeat study in 1 year.

## 2017-08-16 NOTE — Telephone Encounter (Signed)
Repeat carotid doppler ordered for next year.

## 2017-08-21 ENCOUNTER — Ambulatory Visit: Payer: Medicare Other | Admitting: Physical Therapy

## 2017-08-31 ENCOUNTER — Encounter: Payer: Self-pay | Admitting: Internal Medicine

## 2017-08-31 ENCOUNTER — Ambulatory Visit (INDEPENDENT_AMBULATORY_CARE_PROVIDER_SITE_OTHER): Payer: Medicare Other | Admitting: Internal Medicine

## 2017-08-31 VITALS — BP 130/62 | HR 64 | Ht 64.0 in | Wt 172.1 lb

## 2017-08-31 DIAGNOSIS — K2990 Gastroduodenitis, unspecified, without bleeding: Secondary | ICD-10-CM | POA: Diagnosis not present

## 2017-08-31 DIAGNOSIS — R935 Abnormal findings on diagnostic imaging of other abdominal regions, including retroperitoneum: Secondary | ICD-10-CM | POA: Diagnosis not present

## 2017-08-31 DIAGNOSIS — Z86010 Personal history of colonic polyps: Secondary | ICD-10-CM | POA: Diagnosis not present

## 2017-08-31 DIAGNOSIS — K2970 Gastritis, unspecified, without bleeding: Secondary | ICD-10-CM

## 2017-08-31 DIAGNOSIS — K922 Gastrointestinal hemorrhage, unspecified: Secondary | ICD-10-CM

## 2017-08-31 DIAGNOSIS — D62 Acute posthemorrhagic anemia: Secondary | ICD-10-CM

## 2017-08-31 DIAGNOSIS — I6523 Occlusion and stenosis of bilateral carotid arteries: Secondary | ICD-10-CM | POA: Diagnosis not present

## 2017-08-31 NOTE — Patient Instructions (Signed)
Please follow up as needed 

## 2017-08-31 NOTE — Progress Notes (Signed)
HISTORY OF PRESENT ILLNESS:  [name] is a 62 y.o. female with MULTIPLE SIGNIFICANT medical problems including hypertension, diabetes mellitus, obesity, tobacco abuse, chronic renal insufficiency, coronary artery disease, carotid artery disease, peripheral vascular disease, and hyperlipidemia. She was admitted to the hospital 08/01/2017 with a one-week history of recurrent loose stools and abdominal cramping discomfort. She was on Xarelto at the time. Loose stools are found to be GI bleeding. She was admitted with significant acute blood loss anemia with hemoglobin 6.4. Xarelto was held. She was transfused. A CT scan of the abdomen and pelvis was obtained on admission. She was found to have mild wall thickening of the terminal ileum and ileocecal junction. Nonspecific. Also said that have some slight thickening of the gastroesophageal junction and proximal stomach. Thus, she underwent upper endoscopy with Dr. Fuller Plan. The examination was unremarkable except for gastritis. Biopsies revealed reactive gastropathy negative for Helicobacter pylori. Her problems with bleeding stopped. Abdominal discomfort resolved. She was discharged home 2 days later with a hemoglobin of 8.8. This appointment arranged. She tells me that her prescribing physician has discontinued her Xarelto. She tells me that she has a good appetite without abdominal discomfort. Regular bowel movements. It should be noted that she did undergo complete colonoscopy 07/15/2016 because of a history of adenomatous colon polyps. Multiple previous colonoscopies as documented. She was found to have several diminutive colon polyps which were removed. No other abnormalities. Recall colonoscopy in 5 years recommended. Currently she is on a baby aspirin. She also takes omeprazole 40 mg daily for GERD. No active reflux symptoms. GI review of systems negative except for bloating  REVIEW OF SYSTEMS:  All non-GI ROS negative except for anxiety, arthritis, back  pain, cough, depression, fatigue, night sweats, itching, heart murmur, headaches, urinary leakage, ankle swelling  Past Medical History:  Diagnosis Date  . Adenomatous polyp of colon   . Anemia   . Arthritis   . Asthma   . Carotid artery disease ([place])   . Carpal tunnel syndrome   . Chronic renal insufficiency    Stage 3 Kidney disease  . Coronary artery disease   . Depression   . Depression with anxiety   . Diabetes mellitus    type II  . Diverticulosis of colon (without mention of hemorrhage)   . GERD (gastroesophageal reflux disease)   . Heart murmur   . History of hiatal hernia   . Hyperlipidemia   . Hypertension   . Hypothyroidism   . Obesity   . PVD (peripheral vascular disease) ([place])   . Thyroid disease    hypo    Past Surgical History:  Procedure Laterality Date  . ABDOMINAL AORTAGRAM N/A 12/18/2013   Procedure: ABDOMINAL [name];  Surgeon: Wellington Hampshire, MD;  Location: [place] CATH LAB;  Service: Cardiovascular;  Laterality: N/A;  . ABDOMINAL AORTOGRAM W/LOWER EXTREMITY N/A 11/16/2016   Procedure: Abdominal Aortogram w/Lower Extremity;  Surgeon: Wellington Hampshire, MD;  Location: [place] CV LAB;  Service: Cardiovascular;  Laterality: N/A;  . ABDOMINAL AORTOGRAM W/LOWER EXTREMITY N/A 01/25/2017   Procedure: Abdominal Aortogram w/Lower Extremity;  Surgeon: Wellington Hampshire, MD;  Location: [place] CV LAB;  Service: Cardiovascular;  Laterality: N/A;  only completed Lower Extremity  . ABDOMINAL HYSTERECTOMY    . APPENDECTOMY    . APPLICATION OF WOUND VAC Right 02/13/2017   Procedure: APPLICATION OF INCISIONAL WOUND VAC RIGHT GROIN;  Surgeon: Elam Dutch, MD;  Location: [place];  Service: Vascular;  Laterality: Right;  .  CHOLECYSTECTOMY    . CORONARY ARTERY BYPASS GRAFT  2004   x4  . ENDARTERECTOMY FEMORAL Left 11/23/2016   Procedure: ENDARTERECTOMY OF LEFT EXTERNAL COMMON FEMORAL ARTERY WITH EXTENDED LEFT PROFUNDOPLASTY;  Surgeon: Elam Dutch, MD;   Location: [place];  Service: Vascular;  Laterality: Left;  . ENDARTERECTOMY FEMORAL Right 02/13/2017   Procedure: ENDARTERECTOMY RIGHT FEMORAL ARTERY WITH PROFUNDAPLASTY;  Surgeon: Elam Dutch, MD;  Location: [facility] OR;  Service: Vascular;  Laterality: Right;  . ESOPHAGOGASTRODUODENOSCOPY N/A 08/02/2017   Procedure: ESOPHAGOGASTRODUODENOSCOPY (EGD);  Surgeon: Ladene Artist, MD;  Location: [facility] ENDOSCOPY;  Service: Endoscopy;  Laterality: N/A;  . FEMORAL-POPLITEAL BYPASS GRAFT Left 11/23/2016   Procedure: LEFT FEMORAL-BELOW KNEE POPLITEAL ARTERY BYPASS;  Surgeon: Elam Dutch, MD;  Location: [place];  Service: Vascular;  Laterality: Left;  . FEMORAL-POPLITEAL BYPASS GRAFT Right 02/13/2017   Procedure: BYPASS GRAFT RIGHT FEMORAL-BELOW KNEE POPLITEAL ARTERY;  Surgeon: Elam Dutch, MD;  Location: [place];  Service: Vascular;  Laterality: Right;  . KNEE SURGERY Right 2013   arthroscopy  . PATCH ANGIOPLASTY Right 02/13/2017   Procedure: VEIN PATCH ANGIOPLASTY RIGHT POPLITEAL ARTERY AND HEMASHIELD PATCH ANGIOPLASTY OF RIGHT FEMORAL ARTERY;  Surgeon: Elam Dutch, MD;  Location: [place];  Service: Vascular;  Laterality: Right;    Social History [name]  reports that she quit smoking about 15 years ago. Her smoking use included cigarettes. She has a 30.00 pack-year smoking history. she has never used smokeless tobacco. She reports that she does not drink alcohol or use drugs.  family history includes Colon polyps in her daughter; Diabetes in her mother; Heart disease in her father.  No Known Allergies     PHYSICAL EXAMINATION: Vital signs: BP 130/62   Pulse 64   Ht 5\' 4"  (1.626 m)   Wt 172 lb 2 oz (78.1 kg)   BMI 29.55 kg/m   Constitutional: Pleasant. Overweight, unhealthy appearing, no acute distress Psychiatric: alert and oriented x3, cooperative Eyes: extraocular movements intact, anicteric, conjunctiva pink Mouth: oral pharynx moist, no lesions Neck: supple no  lymphadenopathy Cardiovascular: heart regular rate and rhythm, no murmur Lungs: clear to auscultation bilaterally Abdomen: soft, nontender, nondistended, no obvious ascites, no peritoneal signs, normal bowel sounds, no organomegaly Rectal: Omitted Extremities: no clubbing or cyanosis. Trace lower extremity edema bilaterally Skin: no clinically relevant lesions on visible extremities Neuro: No focal deficits. Cranial nerves intact  ASSESSMENT:  #1. Acute GI bleed with acute blood loss anemia in the face of chronic anticoagulation. Negative upper endoscopy save mild gastritis. CT raised the question of mild ileal thickening. Question possible ischemia with her abdominal discomfort, bleeding, and abnormal CT. I possibilities include small bowel AVM or other small bowel lesion, though one would not expect abdominal discomfort. Multiple prior colonoscopies. Up-to-date. Last exam 1 year ago which was complete with excellent preparation. No further problems. Now off anticoagulation #2. Multiple prior colonoscopies. Last examination January 2018 #3. Multiple medical problems #4. GERD on PPI   PLAN:  #1. I reviewed her workup with her #2. Continue PPI #3. Repeat colonoscopy around 2023 is medically fit #4. Resume general medical care with PCP. Interval GI follow-up as needed  25 and spent face-to-face with the patient. Greater than 50% a time use for counseling regarding her recent hospitalization for GI bleeding with associated acute blood loss anemia, abdominal discomfort, and CT abnormality.

## 2017-09-04 ENCOUNTER — Other Ambulatory Visit: Payer: Self-pay | Admitting: Cardiology

## 2017-09-04 NOTE — Telephone Encounter (Signed)
REFILL 

## 2017-09-26 ENCOUNTER — Other Ambulatory Visit: Payer: Self-pay | Admitting: Physician Assistant

## 2017-09-26 DIAGNOSIS — Z139 Encounter for screening, unspecified: Secondary | ICD-10-CM

## 2017-09-27 ENCOUNTER — Ambulatory Visit
Admission: RE | Admit: 2017-09-27 | Discharge: 2017-09-27 | Disposition: A | Discharge status: Home / Self Care | Payer: Medicare Other | Source: Ambulatory Visit | Attending: Physician Assistant | Admitting: Physician Assistant

## 2017-09-27 DIAGNOSIS — Z139 Encounter for screening, unspecified: Secondary | ICD-10-CM

## 2017-09-28 ENCOUNTER — Ambulatory Visit (INDEPENDENT_AMBULATORY_CARE_PROVIDER_SITE_OTHER): Payer: Medicare Other | Admitting: Vascular Surgery

## 2017-09-28 ENCOUNTER — Encounter: Payer: Self-pay | Admitting: Vascular Surgery

## 2017-09-28 ENCOUNTER — Ambulatory Visit (HOSPITAL_COMMUNITY)
Admission: RE | Admit: 2017-09-28 | Discharge: 2017-09-28 | Disposition: A | Discharge status: Home / Self Care | Payer: Medicare Other | Source: Ambulatory Visit | Attending: Vascular Surgery | Admitting: Vascular Surgery

## 2017-09-28 ENCOUNTER — Ambulatory Visit (INDEPENDENT_AMBULATORY_CARE_PROVIDER_SITE_OTHER)
Admission: RE | Admit: 2017-09-28 | Discharge: 2017-09-28 | Disposition: A | Discharge status: Home / Self Care | Payer: Medicare Other | Source: Ambulatory Visit | Attending: Vascular Surgery | Admitting: Vascular Surgery

## 2017-09-28 ENCOUNTER — Other Ambulatory Visit: Payer: Self-pay

## 2017-09-28 ENCOUNTER — Other Ambulatory Visit: Payer: Self-pay | Admitting: Physician Assistant

## 2017-09-28 VITALS — BP 131/55 | HR 51 | Temp 98.5°F | Resp 14 | Ht 64.0 in | Wt 164.0 lb

## 2017-09-28 DIAGNOSIS — Z87891 Personal history of nicotine dependence: Secondary | ICD-10-CM

## 2017-09-28 DIAGNOSIS — I739 Peripheral vascular disease, unspecified: Secondary | ICD-10-CM

## 2017-09-28 DIAGNOSIS — I779 Disorder of arteries and arterioles, unspecified: Secondary | ICD-10-CM

## 2017-09-28 DIAGNOSIS — Z95828 Presence of other vascular implants and grafts: Secondary | ICD-10-CM

## 2017-09-28 DIAGNOSIS — I70203 Unspecified atherosclerosis of native arteries of extremities, bilateral legs: Secondary | ICD-10-CM | POA: Diagnosis not present

## 2017-09-28 DIAGNOSIS — Z951 Presence of aortocoronary bypass graft: Secondary | ICD-10-CM | POA: Insufficient documentation

## 2017-09-28 NOTE — Progress Notes (Signed)
HISTORY AND PHYSICAL     CC:  Check up Requesting Provider:  Nicholes Rough, PA-C  HPI: This is a 62 y.o. female who has underwent a left EIA & CFA/profunda endarterectomy with profundoplasty as well as left femoral to below knee bypass with vein on 11/23/16. She has also had on 02/13/17 a right femoral to below knee bypass with propaten with distal vein cuff due to her vein previously being harvested for CABG.  She does have very small ulcers that are essentially healed on the lateral aspect of each heel.  She has bandaids over these.    She returns today for follow up.  She states that she has done quite well.  She states that in her right inner thigh, she has started having some burning type sx.  She denies any wounds or redness in the area.    She was hospitalized in January with GIB and was taken off of her Xarelto.  She states that she is not on any blood thinners.   She quit smoking last May.    Dr. Fletcher Anon continues to monitor her carotid stenosis.    The pt is on a statin for cholesterol management.  She is on a beta blocker and CCB for blood pressure management.  She is on insulin for diabetes.    Yolanda Bonine will be attending [employer] for music in the fall.   Past Medical History:  Diagnosis Date  . Adenomatous polyp of colon   . Anemia   . Arthritis   . Asthma   . Carotid artery disease ([place])   . Carpal tunnel syndrome   . Chronic renal insufficiency    Stage 3 Kidney disease  . Coronary artery disease   . Depression   . Depression with anxiety   . Diabetes mellitus    type II  . Diverticulosis of colon (without mention of hemorrhage)   . GERD (gastroesophageal reflux disease)   . Heart murmur   . History of hiatal hernia   . Hyperlipidemia   . Hypertension   . Hypothyroidism   . Obesity   . PVD (peripheral vascular disease) ([place])   . Thyroid disease    hypo    Past Surgical History:  Procedure Laterality Date  . ABDOMINAL AORTAGRAM N/A 12/18/2013   Procedure: ABDOMINAL [name];  Surgeon: Wellington Hampshire, MD;  Location: [place] [facility];  Service: Cardiovascular;  Laterality: N/A;  . ABDOMINAL AORTOGRAM W/LOWER EXTREMITY N/A 11/16/2016   Procedure: Abdominal Aortogram w/Lower Extremity;  Surgeon: Wellington Hampshire, MD;  Location: [place] [facility];  Service: Cardiovascular;  Laterality: N/A;  . ABDOMINAL AORTOGRAM W/LOWER EXTREMITY N/A 01/25/2017   Procedure: Abdominal Aortogram w/Lower Extremity;  Surgeon: Wellington Hampshire, MD;  Location: [place] [facility];  Service: Cardiovascular;  Laterality: N/A;  only completed Lower Extremity  . ABDOMINAL HYSTERECTOMY    . APPENDECTOMY    . APPLICATION OF WOUND VAC Right 02/13/2017   Procedure: APPLICATION OF INCISIONAL WOUND VAC RIGHT GROIN;  Surgeon: Elam Dutch, MD;  Location: [place];  Service: Vascular;  Laterality: Right;  . CHOLECYSTECTOMY    . CORONARY ARTERY BYPASS GRAFT  2004   x4  . ENDARTERECTOMY FEMORAL Left 11/23/2016   Procedure: ENDARTERECTOMY OF LEFT EXTERNAL COMMON FEMORAL ARTERY WITH EXTENDED LEFT PROFUNDOPLASTY;  Surgeon: Elam Dutch, MD;  Location: [place];  Service: Vascular;  Laterality: Left;  . ENDARTERECTOMY FEMORAL Right 02/13/2017   Procedure: ENDARTERECTOMY RIGHT FEMORAL ARTERY WITH PROFUNDAPLASTY;  Surgeon: Oneida Alar,  Jessy Oto, MD;  Location: [facility];  Service: Vascular;  Laterality: Right;  . ESOPHAGOGASTRODUODENOSCOPY N/A 08/02/2017   Procedure: ESOPHAGOGASTRODUODENOSCOPY (EGD);  Surgeon: Ladene Artist, MD;  Location: [facility] ENDOSCOPY;  Service: Endoscopy;  Laterality: N/A;  . FEMORAL-POPLITEAL BYPASS GRAFT Left 11/23/2016   Procedure: LEFT FEMORAL-BELOW KNEE POPLITEAL ARTERY BYPASS;  Surgeon: Elam Dutch, MD;  Location: [place];  Service: Vascular;  Laterality: Left;  . FEMORAL-POPLITEAL BYPASS GRAFT Right 02/13/2017   Procedure: BYPASS GRAFT RIGHT FEMORAL-BELOW KNEE POPLITEAL ARTERY;  Surgeon: Elam Dutch, MD;  Location: [place];  Service: Vascular;  Laterality:  Right;  . KNEE SURGERY Right 2013   arthroscopy  . PATCH ANGIOPLASTY Right 02/13/2017   Procedure: VEIN PATCH ANGIOPLASTY RIGHT POPLITEAL ARTERY AND HEMASHIELD PATCH ANGIOPLASTY OF RIGHT FEMORAL ARTERY;  Surgeon: Elam Dutch, MD;  Location: [facility] OR;  Service: Vascular;  Laterality: Right;    No Known Allergies  Current Outpatient Medications  Medication Sig Dispense Refill  . albuterol (PROVENTIL HFA;VENTOLIN HFA) 108 (90 BASE) MCG/ACT inhaler Inhale 2 puffs into the lungs every 6 (six) hours as needed for wheezing or shortness of breath.     . ALPRAZolam (XANAX) 1 MG tablet Take 1 mg by mouth 2 (two) times daily as needed for anxiety.     Marland Kitchen amLODipine (NORVASC) 5 MG tablet Take 1 tablet (5 mg total) by mouth at bedtime. 90 tablet 3  . aspirin EC 81 MG tablet Take 81 mg by mouth at bedtime.    Marland Kitchen atorvastatin (LIPITOR) 80 MG tablet Take 80 mg by mouth every morning.     . B-D INS SYR ULTRAFINE 1CC/31G 31G X 5/16" 1 ML MISC USE NEW NEEDLE FOR MEAL TIME 3 TIMES A DAY AND LONG ACTING INSULIN TWICE A DAY  11  . calcitRIOL (ROCALTROL) 0.25 MCG capsule Take 0.25 mcg by mouth 3 (three) times a week. Monday, Wednesday, Friday  6  . cetirizine (ZYRTEC) 10 MG tablet Take 10 mg by mouth daily.  6  . fenofibrate micronized (LOFIBRA) 200 MG capsule Take 200 mg by mouth at bedtime.     . ferrous sulfate 325 (65 FE) MG tablet Take 325 mg by mouth 3 (three) times daily.  3  . furosemide (LASIX) 40 MG tablet Take 40 mg by mouth 2 (two) times daily.     . Insulin Glargine (BASAGLAR KWIKPEN) 100 UNIT/ML SOPN Inject 20-60 Units into the skin 2 (two) times daily before a meal. Sliding Scale  3  . levothyroxine (SYNTHROID, LEVOTHROID) 175 MCG tablet Take 175 mcg by mouth daily before breakfast.     . loperamide (IMODIUM) 2 MG capsule Take 4 mg by mouth as needed for diarrhea or loose stools.    . metoprolol (LOPRESSOR) 50 MG tablet Take 100-150 mg by mouth See admin instructions. Takes 100 mg in the morning and  170m in the evening.  6  . Omega-3 Fatty Acids (FISH OIL) 1000 MG CAPS Take 1,000 mg by mouth 2 (two) times daily.    .Marland Kitchenomeprazole (PRILOSEC) 40 MG capsule Take 40 mg by mouth daily.    .Glory RosebushVERIO test strip CHECK SUGAR AT HOME 4-5 TIMES A DAY  11  . PARoxetine (PAXIL) 40 MG tablet Take 40 mg by mouth at bedtime.     . promethazine (PHENERGAN) 25 MG tablet Take 25 mg by mouth daily as needed for nausea or vomiting.   6  . sertraline (ZOLOFT) 100 MG tablet Take 100 mg by mouth  daily.    . Vitamin D, Ergocalciferol, (DRISDOL) 50000 UNITS CAPS capsule Take 50,000 Units by mouth once a week. mondays  1  . cilostazol (PLETAL) 100 MG tablet TAKE 1 TABLET BY MOUTH TWICE A DAY (Patient not taking: Reported on 09/28/2017) 180 tablet 2  . NARCAN 4 MG/0.1ML LIQD nasal spray kit Place 1 spray into both nostrils See admin instructions. As needed in case of an overdose  0  . oxyCODONE (OXYCONTIN) 15 mg 12 hr tablet Take 15 mg by mouth every 12 (twelve) hours.    . Oxycodone HCl 10 MG TABS Take 10 mg by mouth.    . VOLTAREN 1 % GEL APPLY TO AFFECTED AREA 4 GRAMS 3 TIMES DAILY AS NEEDED  1   No current facility-administered medications for this visit.     Family History  Problem Relation Age of Onset  . Diabetes Mother   . Heart disease Father   . Colon polyps Daughter   . Colon cancer Neg Hx   . Esophageal cancer Neg Hx   . Stomach cancer Neg Hx   . Rectal cancer Neg Hx     Social History   Socioeconomic History  . Marital status: Widowed    Spouse name: Not on file  . Number of children: 2  . Years of education: Not on file  . Highest education level: Not on file  Occupational History  . 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 Financial resource strain: Not on file  . Food insecurity:    Worry: Not on file    Inability: Not on file  . Transportation needs:    Medical: Not on file    Non-medical: Not on file  Tobacco Use   . Smoking status: Former Smoker    Packs/day: 1.00    Years: 30.00    Pack years: 30.00    Types: Cigarettes    Last attempt to quit: 2004    Years since quitting: 15.2  . Smokeless tobacco: Never Used  Substance and Sexual Activity  . Alcohol use: No    Alcohol/week: 0.0 oz  . Drug use: No  . Sexual activity: Not on file  Lifestyle  . Physical activity:    Days per week: Not on file    Minutes per session: Not on file  . Stress: Not on file  Relationships  . Social connections:    Talks on phone: Not on file    Gets together: Not on file    Attends religious service: Not on file    Active member of club or organization: Not on file    Attends meetings of clubs or organizations: Not on file    Relationship status: Not on file  . Intimate partner violence:    Fear of current or ex partner: Not on file    Emotionally abused: Not on file    Physically abused: Not on file    Forced sexual activity: Not on file  Other Topics Concern  . Not on file  Social History Narrative   Daily Caffeine     REVIEW OF SYSTEMS:   '[X]'  denotes positive finding, '[ ]'  denotes negative finding Cardiac  Comments:  Chest pain or chest pressure:    Shortness of breath upon exertion:    Short of breath when lying flat:    Irregular heart rhythm:        Vascular  Pain in calf, thigh, or hip brought on by ambulation: x See HPI (right inner thigh)  Pain in feet at night that wakes you up from your sleep:     Blood clot in your veins:    Leg swelling:         Pulmonary    Oxygen at home:    Productive cough:     Wheezing:         Neurologic    Sudden weakness in arms or legs:     Sudden numbness in arms or legs:     Sudden onset of difficulty speaking or slurred speech:    Temporary loss of vision in one eye:     Problems with dizziness:         Gastrointestinal    Blood in stool:  x See HPI  Vomited blood:         Genitourinary    Burning when urinating:     Blood in urine:         Psychiatric    Major depression:         Hematologic    Bleeding problems: x See HPI  Problems with blood clotting too easily:        Skin    Rashes or ulcers:        Constitutional    Fever or chills:      PHYSICAL EXAMINATION:  Vitals:   09/28/17 1547  BP: (!) 131/55  Pulse: (!) 51  Resp: 14  Temp: 98.5 F (36.9 C)  SpO2: 96%   Vitals:   09/28/17 1547  Weight: 164 lb (74.4 kg)  Height: '5\' 4"'  (1.626 m)   Body mass index is 28.15 kg/m.  General:  WDWN in NAD; vital signs documented above Gait: Not observed HENT: WNL, normocephalic Pulmonary: normal non-labored breathing , without Rales, rhonchi,  wheezing Cardiac: regular HR, without  Murmurs with bilateral carotid bruits (right >left) Abdomen: soft, NT, no masses Skin: without rashes Vascular Exam/Pulses:  Right Left  Radial 2+ (normal) 2+ (normal)  Ulnar Unable to palpate  Unable to palpate   Femoral 2+ (normal) 2+ (normal)  Popliteal Unable to palpate  Unable to palpate   DP 2+ (normal) 2+ (normal)  PT 2+ (normal) Unable to palpate    Extremities: without ischemic changes, without Gangrene , without cellulitis; without open wounds; very small essentially healed ulcer on the lateral aspect of each heel. Musculoskeletal: no muscle wasting or atrophy  Neurologic: A&O X 3;  No focal weakness or paresthesias are detected Psychiatric:  The pt has Normal affect.   Non-Invasive Vascular Imaging:   ABI's 09/28/17: Right:  0.96 Left:  0.61 TBI's 09/28/17: Right:  0.79 Left:  0.59  BLE arterial duplex 09/28/17: Right:  Bypass graft with 30-49% stenosis in the inflow artery Left:  Bypass with 50-74% stenosis in the inflow artery  Pt meds includes: Statin:  Yes.   Beta Blocker:  Yes.   Aspirin:  No. ACEI:  No. ARB:  No. CCB use:  Yes Other Antiplatelet/Anticoagulant:  No   ASSESSMENT/PLAN:: 62 y.o. female  left EIA & CFA/profunda endarterectomy with profundoplasty as well as left femoral to  below knee bypass with vein on 11/23/16. She has also had on 02/13/17 a right femoral to below knee bypass with propaten with distal vein cuff due to her vein previously being harvested for CABG.   -pt is doing well and not having claudication symptoms.  She does have a 50-74% narrowing of  the inflow artery on the left.  We will have her return in 3 months to repeat ABI's and duplex of bypasses.  Dr. Oneida Alar discussed with the pt should she develop pain in her left leg or develop wounds or the small heel ulcer doesn't heal, we need to see her back sooner than 3 months and she expressed understanding.  -She does have some tingling pain in her right inner thigh.  There is no erythema, wounds or drainage in this area and could be related to an irritated nerve given her femoral exposure.   She will continue to monitor this.  -Dr. Fletcher Anon is following her carotid duplex and she just had this done in February, which showed the right 1-39% and the left with 40-59%.  She is asymptomatic.     Leontine Locket, PA-C [facility] [phone]  Clinic MD:  Pt seen and examined with Dr. Oneida Alar  History and exam findings as above.  Patient currently is asymptomatic but may be developing a narrowing of the inflow artery above her left leg bypass.  She has no claudication or rest pain symptoms.  Her ABIs have not dropped significantly.  However, I believe we need to continue to keep a close watch on this.  She will return in 3 months time with repeat ABIs and duplex of her bypass graft.  If the velocities continue to increase or she develops symptoms or decline in her ABIs we will consider an arteriogram.  Ruta Hinds, MD [facility] [place]: [phone] Pager: [phone]

## 2017-09-29 ENCOUNTER — Other Ambulatory Visit: Payer: Self-pay

## 2017-09-29 DIAGNOSIS — I739 Peripheral vascular disease, unspecified: Secondary | ICD-10-CM

## 2017-09-29 DIAGNOSIS — Z95828 Presence of other vascular implants and grafts: Secondary | ICD-10-CM

## 2017-09-29 DIAGNOSIS — I779 Disorder of arteries and arterioles, unspecified: Secondary | ICD-10-CM

## 2017-11-28 ENCOUNTER — Other Ambulatory Visit: Payer: Self-pay | Admitting: Physician Assistant

## 2017-11-28 DIAGNOSIS — E2839 Other primary ovarian failure: Secondary | ICD-10-CM

## 2018-01-01 ENCOUNTER — Encounter (HOSPITAL_COMMUNITY): Payer: Self-pay

## 2018-01-01 ENCOUNTER — Other Ambulatory Visit: Payer: Self-pay

## 2018-01-01 ENCOUNTER — Ambulatory Visit (INDEPENDENT_AMBULATORY_CARE_PROVIDER_SITE_OTHER)
Admission: RE | Admit: 2018-01-01 | Discharge: 2018-01-01 | Disposition: A | Discharge status: Home / Self Care | Payer: Medicare Other | Source: Ambulatory Visit | Attending: Vascular Surgery | Admitting: Vascular Surgery

## 2018-01-01 ENCOUNTER — Encounter: Payer: Self-pay | Admitting: Family

## 2018-01-01 ENCOUNTER — Encounter: Payer: Self-pay | Admitting: *Deleted

## 2018-01-01 ENCOUNTER — Other Ambulatory Visit: Payer: Self-pay | Admitting: *Deleted

## 2018-01-01 ENCOUNTER — Ambulatory Visit (HOSPITAL_COMMUNITY)
Admission: RE | Admit: 2018-01-01 | Discharge: 2018-01-01 | Disposition: A | Discharge status: Home / Self Care | Payer: Medicare Other | Source: Ambulatory Visit | Attending: Vascular Surgery | Admitting: Vascular Surgery

## 2018-01-01 ENCOUNTER — Ambulatory Visit (INDEPENDENT_AMBULATORY_CARE_PROVIDER_SITE_OTHER): Payer: Medicare Other | Admitting: Family

## 2018-01-01 ENCOUNTER — Ambulatory Visit: Payer: Medicare Other | Admitting: Family

## 2018-01-01 VITALS — BP 121/61 | HR 49 | Temp 97.9°F | Resp 16 | Ht 64.0 in | Wt 163.5 lb

## 2018-01-01 DIAGNOSIS — I779 Disorder of arteries and arterioles, unspecified: Secondary | ICD-10-CM

## 2018-01-01 DIAGNOSIS — I70293 Other atherosclerosis of native arteries of extremities, bilateral legs: Secondary | ICD-10-CM | POA: Diagnosis not present

## 2018-01-01 DIAGNOSIS — I739 Peripheral vascular disease, unspecified: Secondary | ICD-10-CM

## 2018-01-01 DIAGNOSIS — Z87891 Personal history of nicotine dependence: Secondary | ICD-10-CM | POA: Diagnosis not present

## 2018-01-01 DIAGNOSIS — I70702 Unspecified atherosclerosis of other type of bypass graft(s) of the extremities, left leg: Secondary | ICD-10-CM | POA: Diagnosis not present

## 2018-01-01 DIAGNOSIS — Z95828 Presence of other vascular implants and grafts: Secondary | ICD-10-CM | POA: Insufficient documentation

## 2018-01-01 NOTE — Progress Notes (Signed)
[facility] [place]   CC: Follow up peripheral artery occlusive disease  History of Present Illness [name] is a 62 y.o. female who is s/p left external iliac common femoral profunda endarterectomy with profundoplasty as well as left femoral to below-knee popliteal artery bypass with vein on 11/23/2016 by Dr. Oneida Alar.    Most recently she underwent a right femoral to below-knee popliteal bypass with propaten with a distal vein cuff on 02-13-17 by Dr. Oneida Alar  due to her vein previously been harvested for coronary bypass grafting. She has no claudication symptoms on the right side. She also has a small amount of drainage from the right groin.   Ulcers at lateral aspects of both heels are completely healed.   Dr. Oneida Alar and S. Rhyne PA-C last evaluated pt on 09-28-17. At that time pt was asymptomatic but may be developing a narrowing of the inflow artery above her left leg bypass.  She had no claudication or rest pain symptoms.  Her ABIs had not dropped significantly.  However,  Dr. Oneida Alar believed we need to continue to keep a close watch on this.  She was to return in 3 months time with repeat ABIs and duplex of her bypass graft.  If the velocities continue to increase or she develops symptoms or decline in her ABIs we will consider an arteriogram.   Serum creatinine was 1.29 on 08-03-17, last result on file. GFR 43.  She is walking at least an hour daily. After walking 30-60 minutes, her left calf feels like "it tightens up".  Dr. Fletcher Anon is monitoring her carotid arteries with duplex, pt has bilateral carotid bruits.    Diabetic: Yes, 8.7 A1C per pt was last A1C Tobacco use: former smoker, quit in 2004, smoked x 30 years  Pt meds include: Statin :Yes Betablocker: Yes ASA: Yes Other anticoagulants/antiplatelets: Xarelto 2.5 mg bid prescribed by Dr. Fletcher Anon, could only take for 2 weeks, then was admitted to [facility] for GI bleeding, had a transfusion.    Past Medical  History:  Diagnosis Date  . Adenomatous polyp of colon   . Anemia   . Arthritis   . Asthma   . Carotid artery disease ([place])   . Carpal tunnel syndrome   . Chronic renal insufficiency    Stage 3 Kidney disease  . Coronary artery disease   . Depression   . Depression with anxiety   . Diabetes mellitus    type II  . Diverticulosis of colon (without mention of hemorrhage)   . GERD (gastroesophageal reflux disease)   . Heart murmur   . History of hiatal hernia   . Hyperlipidemia   . Hypertension   . Hypothyroidism   . Obesity   . PVD (peripheral vascular disease) ([place])   . Thyroid disease    hypo    Social History Social History   Tobacco Use  . Smoking status: Former Smoker    Packs/day: 1.00    Years: 30.00    Pack years: 30.00    Types: Cigarettes    Last attempt to quit: 2018    Years since quitting: 1.4  . Smokeless tobacco: Never Used  Substance Use Topics  . Alcohol use: No    Alcohol/week: 0.0 oz  . Drug use: No    Family History Family History  Problem Relation Age of Onset  . Diabetes Mother   . Heart disease Father   . Colon polyps Daughter   . Colon cancer Neg Hx   .  Esophageal cancer Neg Hx   . Stomach cancer Neg Hx   . Rectal cancer Neg Hx     Past Surgical History:  Procedure Laterality Date  . ABDOMINAL AORTAGRAM N/A 12/18/2013   Procedure: ABDOMINAL [name];  Surgeon: Wellington Hampshire, MD;  Location: [place] CATH LAB;  Service: Cardiovascular;  Laterality: N/A;  . ABDOMINAL AORTOGRAM W/LOWER EXTREMITY N/A 11/16/2016   Procedure: Abdominal Aortogram w/Lower Extremity;  Surgeon: Wellington Hampshire, MD;  Location: [place] CV LAB;  Service: Cardiovascular;  Laterality: N/A;  . ABDOMINAL AORTOGRAM W/LOWER EXTREMITY N/A 01/25/2017   Procedure: Abdominal Aortogram w/Lower Extremity;  Surgeon: Wellington Hampshire, MD;  Location: [place] CV LAB;  Service: Cardiovascular;  Laterality: N/A;  only completed Lower Extremity  . ABDOMINAL HYSTERECTOMY     . APPENDECTOMY    . APPLICATION OF WOUND VAC Right 02/13/2017   Procedure: APPLICATION OF INCISIONAL WOUND VAC RIGHT GROIN;  Surgeon: Elam Dutch, MD;  Location: [place];  Service: Vascular;  Laterality: Right;  . CHOLECYSTECTOMY    . CORONARY ARTERY BYPASS GRAFT  2004   x4  . ENDARTERECTOMY FEMORAL Left 11/23/2016   Procedure: ENDARTERECTOMY OF LEFT EXTERNAL COMMON FEMORAL ARTERY WITH EXTENDED LEFT PROFUNDOPLASTY;  Surgeon: Elam Dutch, MD;  Location: [place];  Service: Vascular;  Laterality: Left;  . ENDARTERECTOMY FEMORAL Right 02/13/2017   Procedure: ENDARTERECTOMY RIGHT FEMORAL ARTERY WITH PROFUNDAPLASTY;  Surgeon: Elam Dutch, MD;  [facility];  Service: Vascular;  Laterality: Right;  . ESOPHAGOGASTRODUODENOSCOPY N/A 08/02/2017   Procedure: ESOPHAGOGASTRODUODENOSCOPY (EGD);  Surgeon: Ladene Artist, MD;  Location: [facility] ENDOSCOPY;  Service: Endoscopy;  Laterality: N/A;  . FEMORAL-POPLITEAL BYPASS GRAFT Left 11/23/2016   Procedure: LEFT FEMORAL-BELOW KNEE POPLITEAL ARTERY BYPASS;  Surgeon: Elam Dutch, MD;  Location: [place];  Service: Vascular;  Laterality: Left;  . FEMORAL-POPLITEAL BYPASS GRAFT Right 02/13/2017   Procedure: BYPASS GRAFT RIGHT FEMORAL-BELOW KNEE POPLITEAL ARTERY;  Surgeon: Elam Dutch, MD;  Location: [place];  Service: Vascular;  Laterality: Right;  . KNEE SURGERY Right 2013   arthroscopy  . PATCH ANGIOPLASTY Right 02/13/2017   Procedure: VEIN PATCH ANGIOPLASTY RIGHT POPLITEAL ARTERY AND HEMASHIELD PATCH ANGIOPLASTY OF RIGHT FEMORAL ARTERY;  Surgeon: Elam Dutch, MD;  Location: [facility] OR;  Service: Vascular;  Laterality: Right;    No Known Allergies  Current Outpatient Medications  Medication Sig Dispense Refill  . albuterol (PROVENTIL HFA;VENTOLIN HFA) 108 (90 BASE) MCG/ACT inhaler Inhale 2 puffs into the lungs every 6 (six) hours as needed for wheezing or shortness of breath.     . ALPRAZolam (XANAX) 1 MG tablet Take 1 mg by mouth 2 (two) times  daily as needed for anxiety.     Marland Kitchen amLODipine (NORVASC) 5 MG tablet Take 1 tablet (5 mg total) by mouth at bedtime. 90 tablet 3  . aspirin EC 81 MG tablet Take 81 mg by mouth at bedtime.    Marland Kitchen atorvastatin (LIPITOR) 80 MG tablet Take 80 mg by mouth every morning.     . B-D INS SYR ULTRAFINE 1CC/31G 31G X 5/16" 1 ML MISC USE NEW NEEDLE FOR MEAL TIME 3 TIMES A DAY AND LONG ACTING INSULIN TWICE A DAY  11  . calcitRIOL (ROCALTROL) 0.25 MCG capsule Take 0.25 mcg by mouth 3 (three) times a week. Monday, Wednesday, Friday  6  . cetirizine (ZYRTEC) 10 MG tablet Take 10 mg by mouth daily.  6  . fenofibrate micronized (LOFIBRA) 200 MG capsule Take 200 mg  by mouth at bedtime.     . ferrous sulfate 325 (65 FE) MG tablet Take 325 mg by mouth 3 (three) times daily.  3  . furosemide (LASIX) 40 MG tablet Take 40 mg by mouth 2 (two) times daily.     . Insulin Glargine (BASAGLAR KWIKPEN) 100 UNIT/ML SOPN Inject 20-60 Units into the skin 2 (two) times daily before a meal. Sliding Scale  3  . levothyroxine (SYNTHROID, LEVOTHROID) 150 MCG tablet Take 150 mcg by mouth daily before breakfast.    . loperamide (IMODIUM) 2 MG capsule Take 4 mg by mouth as needed for diarrhea or loose stools.    . metoprolol (LOPRESSOR) 50 MG tablet Take 100-150 mg by mouth See admin instructions. Takes 100 mg in the morning and 110m in the evening.  6  . NARCAN 4 MG/0.1ML LIQD nasal spray kit Place 1 spray into both nostrils See admin instructions. As needed in case of an overdose  0  . Omega-3 Fatty Acids (FISH OIL) 1000 MG CAPS Take 1,000 mg by mouth 2 (two) times daily.    .Marland Kitchenomeprazole (PRILOSEC) 40 MG capsule Take 40 mg by mouth daily.    .Glory RosebushVERIO test strip CHECK SUGAR AT HOME 4-5 TIMES A DAY  11  . oxyCODONE (OXYCONTIN) 15 mg 12 hr tablet Take 15 mg by mouth every 12 (twelve) hours.    . Oxycodone HCl 10 MG TABS Take 10 mg by mouth.    .Marland KitchenPARoxetine (PAXIL) 40 MG tablet Take 40 mg by mouth at bedtime.     . promethazine  (PHENERGAN) 25 MG tablet Take 25 mg by mouth daily as needed for nausea or vomiting.   6  . Vitamin D, Ergocalciferol, (DRISDOL) 50000 UNITS CAPS capsule Take 50,000 Units by mouth once a week. mondays  1   No current facility-administered medications for this visit.     ROS: See HPI for pertinent positives and negatives.   Physical Examination  Vitals:   01/01/18 1259 01/01/18 1304  BP: (!) 131/56 121/61  Pulse: (!) 49   Resp: 16   Temp: 97.9 F (36.6 C)   TempSrc: Oral   SpO2: 97%   Weight: 163 lb 8 oz (74.2 kg)   Height: '5\' 4"'  (1.626 m)    Body mass index is 28.06 kg/m.  General: A&O x 3, WDWN, female. Gait: normal Eyes: PERRLA. Pulmonary: Respirations are non labored, CTAB, good air movement in all fields.  Cardiac: regular rhythm, + low grade murmur.         Carotid Bruits Right Left   Positive Positive   Radial pulses are 1+ palpable bilaterally   Adominal aortic pulse is not palpable                         VASCULAR EXAM: Extremities without ischemic changes, without Gangrene; without open wounds. Bilateral heel ulcers are well healed. Bilateral groin incisions have completely healed.  LE Pulses Right Left       FEMORAL  3+ palpable  4+ palpable        POPLITEAL  2+ palpable   1+ palpable       POSTERIOR TIBIAL  2+ palpable   not palpable        DORSALIS PEDIS      ANTERIOR TIBIAL 2+ palpable  not palpable    Abdomen: soft, NT, no palpable masses. Musculoskeletal: no muscle wasting or atrophy.      Skin: no rashes, no cellulitis, no ulcers noted. Neurologic: A&O X 3; appropriate affect, Sensation is normal; MOTOR FUNCTION:  moving all extremities equally, motor strength 5/5 throughout. Voice is slightly husky. CN 2-12 intact.  Psychiatric: Thought content is normal, mood appropriate for  clinical situation.     ASSESSMENT: [name] is a 62 y.o. female who is s/p left external iliac common femoral profunda endarterectomy with profundoplasty as well as left femoral to below-knee popliteal artery bypass with vein on 11/23/2016.  She is also s/p right femoral to below-knee popliteal bypass with propaten with a distal vein cuff on 02-13-17.   After walking 30-60 minutes, her left calf feels like "it tightens up".   Both groin incisions have healed completely, both heel ulcers have healed.  Bilateral carotid bruits, no hx of stroke or TIA, Dr. Fletcher Anon has been monitoring her carotid arteries by duplex.    DATA  Bilateral LE Arterial Duplex (01-01-18); Right: 224 cm/s PSV in the CFA. , all biphasic waveforms. Patent graft with inflow disease.  Left: Inflow to graft at 233 cm/s, improved from 356 cm/s. 718 cm/s PSV at mid graft, was 92 cm/s on 09-28-17. Biphasic waveforms from inflow to mid graft, monophasic waveforms from distal graft to outflow.    Serum creatinine was 1.29 on 08-03-17, last result on file. GFR 43.    ABI (Date: 01/01/2018):  R:   ABI: 1.06 (was 0.96 on 09-28-17),   PT: bi  DP: bi  TBI:  0.71 (was 0.79)  L:   ABI: 0.60 (was 0.61),   PT: mono  DP: mono  TBI: 0.41 (was 0.69)  Stable and normal right ABI and TBI with biphasic waveforms. Stable left ABI with moderate disease, monophasic waveforms.  Decline lin left TBI.    PLAN:  Continue extensive walking program.  Based on the patient's vascular studies and examination, and after discussing with Dr. Trula Slade, pt will be scheduled for an aortogram with Dr. Oneida Alar next week, possible intervention.   I advised her to notify us if she develops concerns re the circulation in her feet or legs    I discussed in depth with the patient the nature of atherosclerosis, and emphasized the importance of maximal medical management including strict control of blood pressure, blood glucose, and  lipid levels, obtaining regular exercise, and continued cessation of smoking.  The patient is aware that without maximal medical management the underlying atherosclerotic disease process will progress, limiting the benefit of any interventions.  The patient was given information about PAD including signs, symptoms, treatment, what symptoms should prompt the patient to seek immediate medical care, and risk reduction measures to take.  Clemon Chambers, RN, MSN, FNP-C [facility] Phone: [phone]  Clinic MD: Trula Slade  01/01/18 1:17 PM

## 2018-01-01 NOTE — Patient Instructions (Signed)

## 2018-01-08 ENCOUNTER — Ambulatory Visit
Admission: RE | Admit: 2018-01-08 | Discharge: 2018-01-08 | Disposition: A | Discharge status: Home / Self Care | Payer: Medicare Other | Source: Ambulatory Visit | Attending: Physician Assistant | Admitting: Physician Assistant

## 2018-01-08 DIAGNOSIS — E2839 Other primary ovarian failure: Secondary | ICD-10-CM

## 2018-01-12 ENCOUNTER — Ambulatory Visit (HOSPITAL_COMMUNITY)
Admission: RE | Admit: 2018-01-12 | Discharge: 2018-01-12 | Disposition: A | Discharge status: Home / Self Care | Payer: Medicare Other | Source: Ambulatory Visit | Attending: Vascular Surgery | Admitting: Vascular Surgery

## 2018-01-12 ENCOUNTER — Telehealth: Payer: Self-pay | Admitting: Vascular Surgery

## 2018-01-12 ENCOUNTER — Encounter (HOSPITAL_COMMUNITY)
Admission: RE | Disposition: A | Discharge status: Home / Self Care | Payer: Self-pay | Source: Ambulatory Visit | Attending: Vascular Surgery

## 2018-01-12 DIAGNOSIS — R509 Fever, unspecified: Secondary | ICD-10-CM | POA: Insufficient documentation

## 2018-01-12 DIAGNOSIS — R309 Painful micturition, unspecified: Secondary | ICD-10-CM | POA: Insufficient documentation

## 2018-01-12 DIAGNOSIS — Z538 Procedure and treatment not carried out for other reasons: Secondary | ICD-10-CM | POA: Insufficient documentation

## 2018-01-12 DIAGNOSIS — Z01818 Encounter for other preprocedural examination: Secondary | ICD-10-CM | POA: Diagnosis present

## 2018-01-12 LAB — POCT I-STAT, CHEM 8
BUN: 79 mg/dL — AB (ref 8–23)
CREATININE: 2.6 mg/dL — AB (ref 0.44–1.00)
Calcium, Ion: 1.07 mmol/L — ABNORMAL LOW (ref 1.15–1.40)
Chloride: 100 mmol/L (ref 98–111)
Glucose, Bld: 225 mg/dL — ABNORMAL HIGH (ref 70–99)
HCT: 40 % (ref 36.0–46.0)
HEMOGLOBIN: 13.6 g/dL (ref 12.0–15.0)
POTASSIUM: 4.9 mmol/L (ref 3.5–5.1)
Sodium: 137 mmol/L (ref 135–145)
TCO2: 28 mmol/L (ref 22–32)

## 2018-01-12 SURGERY — LOWER EXTREMITY ANGIOGRAPHY
Anesthesia: LOCAL

## 2018-01-12 MED ORDER — SODIUM CHLORIDE 0.9 % IV SOLN
INTRAVENOUS | Status: DC
  Administered 2018-01-12: 06:00:00 via INTRAVENOUS

## 2018-01-12 NOTE — Telephone Encounter (Signed)
Pt arteriogram cancelled today due to rising Creatinine.  The indication for procedure was claudication with possible narrowing of her bypass.  The procedure is urgent but certainly not emergent to the point of risking worsening renal function.  I spoke with the pt and explained this.  She has an appt with Dr Florene Glen early next week.  We will schedule her to see me in the office in a few weeks to reassess  Ruta Hinds MD

## 2018-01-12 NOTE — Progress Notes (Signed)
Pt states she thinks she has a UTI. Slight fever at home.  Wiping pink/red when she voids/burning. Creat and BUN high. Gwynne Edinger notified.  Procedure cancelled per Dr Oneida Alar.  Pt encouraged to go see her kidney Dr ASAP.

## 2018-01-15 ENCOUNTER — Telehealth: Payer: Self-pay | Admitting: *Deleted

## 2018-01-15 ENCOUNTER — Telehealth: Payer: Self-pay | Admitting: Vascular Surgery

## 2018-01-15 NOTE — Telephone Encounter (Signed)
Patient has appointment with Dr. Florene Glen at [facility] for 01/16/18.

## 2018-01-15 NOTE — Telephone Encounter (Signed)
sch appt spk to pt 02/22/18 1115 am f/u MD

## 2018-02-20 ENCOUNTER — Encounter: Payer: Self-pay | Admitting: *Deleted

## 2018-02-20 ENCOUNTER — Ambulatory Visit (INDEPENDENT_AMBULATORY_CARE_PROVIDER_SITE_OTHER): Payer: Medicare Other | Admitting: Cardiovascular Disease

## 2018-02-20 ENCOUNTER — Telehealth: Payer: Self-pay | Admitting: *Deleted

## 2018-02-20 ENCOUNTER — Encounter: Payer: Self-pay | Admitting: Cardiovascular Disease

## 2018-02-20 VITALS — BP 146/65 | HR 50 | Ht 64.0 in | Wt 169.6 lb

## 2018-02-20 DIAGNOSIS — E785 Hyperlipidemia, unspecified: Secondary | ICD-10-CM

## 2018-02-20 DIAGNOSIS — I2510 Atherosclerotic heart disease of native coronary artery without angina pectoris: Secondary | ICD-10-CM

## 2018-02-20 DIAGNOSIS — I779 Disorder of arteries and arterioles, unspecified: Secondary | ICD-10-CM

## 2018-02-20 DIAGNOSIS — I739 Peripheral vascular disease, unspecified: Secondary | ICD-10-CM | POA: Diagnosis not present

## 2018-02-20 DIAGNOSIS — I10 Essential (primary) hypertension: Secondary | ICD-10-CM | POA: Diagnosis not present

## 2018-02-20 DIAGNOSIS — I6523 Occlusion and stenosis of bilateral carotid arteries: Secondary | ICD-10-CM

## 2018-02-20 DIAGNOSIS — Z01818 Encounter for other preprocedural examination: Secondary | ICD-10-CM

## 2018-02-20 NOTE — Patient Instructions (Signed)
Medication Instructions: Your physician recommends that you continue on your current medications as directed. Please refer to the Current Medication list given to you today.  If you need a refill on your cardiac medications before your next appointment, please call your pharmacy.    Follow-Up: Your physician wants you to follow-up in 2 months with Dr. Fletcher Anon.   Thank you for choosing Heartcare at [facility]!!

## 2018-02-20 NOTE — H&P (View-Only) (Signed)
Cardiology Office Note   Date:  02/20/2018   ID:  [name], DOB Dec 01, 1955, MRN [id number]  PCP:  Nicholes Rough, PA-C  Cardiologist:   Dr. Stanford Breed  No chief complaint on file.     History of Present Illness: [name] is a 62 y.o. female who presents for a followup visit regarding peripheral arterial disease. She has known history of  coronary artery status post coronary bypass graft surgery in 2004. She is known to have moderate bilateral carotid stenosis.  She also has prolonged history of diabetes with  chronic kidney disease and  diabetic neuropathy. 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  She is s/p left external iliac/common femoral and profunda endarterectomy with extended profundoplasty, ligation of the left superficial femoral artery and femoral to below the knee popliteal artery bypass with a vein graft in 05/18 and more recently right femoral to below-knee popliteal artery bypass in August, 2018.  Revascularization was performed for critical limb ischemia. She had GI bleed on small dose Xarelto which was started for peripheral arterial disease.  EGD was unremarkable.  She was taken off Xarelto.  She has been tolerating low-dose aspirin without bleeding issues.  She has been doing well with no recent chest pain or shortness of breath.  She has minimal left calf claudication.  However, she underwent noninvasive vascular evaluation with Dr. Oneida Alar in June which showed a drop in ABI to 0.6 on the left with moderate left common femoral artery stenosis and 70% stenosis in the mid segment of the graft.  The patient was scheduled for angiography with Dr. Oneida Alar but it was canceled due to worsening renal function.  Creatinine was 2.6. She saw Dr. Florene Glen and the dose of furosemide was decreased to 40 mg once daily as she first felt to be volume depleted.  She is on no other nephrotoxic medications.  Past Medical History:  Diagnosis  Date  . Adenomatous polyp of colon   . Anemia   . Arthritis   . Asthma   . Carotid artery disease ([place])   . Carpal tunnel syndrome   . Chronic renal insufficiency    Stage 3 Kidney disease  . Coronary artery disease   . Depression   . Depression with anxiety   . Diabetes mellitus    type II  . Diverticulosis of colon (without mention of hemorrhage)   . GERD (gastroesophageal reflux disease)   . Heart murmur   . History of hiatal hernia   . Hyperlipidemia   . Hypertension   . Hypothyroidism   . Obesity   . PVD (peripheral vascular disease) ([place])   . Thyroid disease    hypo    Past Surgical History:  Procedure Laterality Date  . ABDOMINAL AORTAGRAM N/A 12/18/2013   Procedure: ABDOMINAL [name];  Surgeon: Wellington Hampshire, MD;  Location: [place] CATH LAB;  Service: Cardiovascular;  Laterality: N/A;  . ABDOMINAL AORTOGRAM W/LOWER EXTREMITY N/A 11/16/2016   Procedure: Abdominal Aortogram w/Lower Extremity;  Surgeon: Wellington Hampshire, MD;  Location: [place] CV LAB;  Service: Cardiovascular;  Laterality: N/A;  . ABDOMINAL AORTOGRAM W/LOWER EXTREMITY N/A 01/25/2017   Procedure: Abdominal Aortogram w/Lower Extremity;  Surgeon: Wellington Hampshire, MD;  Location: [place] CV LAB;  Service: Cardiovascular;  Laterality: N/A;  only completed Lower Extremity  . ABDOMINAL HYSTERECTOMY    . APPENDECTOMY    . APPLICATION OF WOUND VAC Right 02/13/2017  Procedure: APPLICATION OF INCISIONAL WOUND VAC RIGHT GROIN;  Surgeon: Elam Dutch, MD;  Location: [facility];  Service: Vascular;  Laterality: Right;  . CHOLECYSTECTOMY    . CORONARY ARTERY BYPASS GRAFT  2004   x4  . ENDARTERECTOMY FEMORAL Left 11/23/2016   Procedure: ENDARTERECTOMY OF LEFT EXTERNAL COMMON FEMORAL ARTERY WITH EXTENDED LEFT PROFUNDOPLASTY;  Surgeon: Elam Dutch, MD;  Location: [place];  Service: Vascular;  Laterality: Left;  . ENDARTERECTOMY FEMORAL Right 02/13/2017   Procedure: ENDARTERECTOMY RIGHT FEMORAL ARTERY WITH  PROFUNDAPLASTY;  Surgeon: Elam Dutch, MD;  Location[facility] OR;  Service: Vascular;  Laterality: Right;  . ESOPHAGOGASTRODUODENOSCOPY N/A 08/02/2017   Procedure: ESOPHAGOGASTRODUODENOSCOPY (EGD);  Surgeon: Ladene Artist, MD;  [facility] END[facility];  Service: Endoscopy;  Laterality: N/A;  . FEMORAL-POPLITEAL BYPASS GRAFT Left 11/23/2016   Procedure: LEFT FEMORAL-BELOW KNEE POPLITEAL ARTERY BYPASS;  Surgeon: Elam Dutch, MD;  Location: [place];  Service: Vascular;  Laterality: Left;  . FEMORAL-POPLITEAL BYPASS GRAFT Right 02/13/2017   Procedure: BYPASS GRAFT RIGHT FEMORAL-BELOW KNEE POPLITEAL ARTERY;  Surgeon: Elam Dutch, MD;  Location: [place];  Service: Vascular;  Laterality: Right;  . KNEE SURGERY Right 2013   arthroscopy  . PATCH ANGIOPLASTY Right 02/13/2017   Procedure: VEIN PATCH ANGIOPLASTY RIGHT POPLITEAL ARTERY AND HEMASHIELD PATCH ANGIOPLASTY OF RIGHT FEMORAL ARTERY;  Surgeon: Elam Dutch, MD;  [facility] OR;  Service: Vascular;  Laterality: Right;     Current Outpatient Medications  Medication Sig Dispense Refill  . albuterol (PROVENTIL HFA;VENTOLIN HFA) 108 (90 BASE) MCG/ACT inhaler Inhale 2 puffs into the lungs every 6 (six) hours as needed for wheezing or shortness of breath.     . ALPRAZolam (XANAX) 1 MG tablet Take 1 mg by mouth 2 (two) times daily as needed for anxiety.     Marland Kitchen amLODipine (NORVASC) 5 MG tablet Take 1 tablet (5 mg total) by mouth at bedtime. 90 tablet 3  . aspirin EC 81 MG tablet Take 81 mg by mouth at bedtime.    Marland Kitchen atorvastatin (LIPITOR) 80 MG tablet Take 80 mg by mouth every morning.     . B-D INS SYR ULTRAFINE 1CC/31G 31G X 5/16" 1 ML MISC USE NEW NEEDLE FOR MEAL TIME 3 TIMES A DAY AND LONG ACTING INSULIN TWICE A DAY  11  . calcitRIOL (ROCALTROL) 0.25 MCG capsule Take 0.25 mcg by mouth 3 (three) times a week. Monday, Wednesday, Friday  6  . cetirizine (ZYRTEC) 10 MG tablet Take 10 mg by mouth daily.  6  . fenofibrate micronized (LOFIBRA) 200  MG capsule Take 200 mg by mouth at bedtime.     . ferrous sulfate 325 (65 FE) MG tablet Take 325 mg by mouth 3 (three) times daily.  3  . furosemide (LASIX) 40 MG tablet Take 40 mg by mouth 2 (two) times daily.     . Insulin Glargine (BASAGLAR KWIKPEN) 100 UNIT/ML SOPN Inject 20-60 Units into the skin 2 (two) times daily before a meal. Sliding Scale  3  . levothyroxine (SYNTHROID, LEVOTHROID) 150 MCG tablet Take 150 mcg by mouth daily before breakfast.    . loperamide (IMODIUM) 2 MG capsule Take 4 mg by mouth as needed for diarrhea or loose stools.    . metoprolol tartrate (LOPRESSOR) 100 MG tablet Take 100 mg by mouth 2 (two) times daily.   6  . NARCAN 4 MG/0.1ML LIQD nasal spray kit Place 1 spray into both nostrils See admin instructions. As needed in  case of an overdose  0  . Omega-3 Fatty Acids (FISH OIL) 1000 MG CAPS Take 1,000 mg by mouth 2 (two) times daily.    Marland Kitchen omeprazole (PRILOSEC) 40 MG capsule Take 40 mg by mouth daily.    Glory Rosebush VERIO test strip CHECK SUGAR AT HOME 4-5 TIMES A DAY  11  . oxyCODONE (OXYCONTIN) 15 mg 12 hr tablet Take 15 mg by mouth every 12 (twelve) hours.    Marland Kitchen oxyCODONE-acetaminophen (PERCOCET) 10-325 MG tablet Take 1 tablet by mouth 3 (three) times daily as needed.  0  . PARoxetine (PAXIL) 40 MG tablet Take 40 mg by mouth at bedtime.     . promethazine (PHENERGAN) 25 MG tablet Take 25 mg by mouth daily as needed for nausea or vomiting.   6  . Vitamin D, Ergocalciferol, (DRISDOL) 50000 UNITS CAPS capsule Take 50,000 Units by mouth once a week. mondays  1   No current facility-administered medications for this visit.     Allergies:   Patient has no known allergies.    Social History:  The patient  reports that she quit smoking about 19 months ago. Her smoking use included cigarettes. She has a 30.00 pack-year smoking history. She has never used smokeless tobacco. She reports that she does not drink alcohol or use drugs.   Family History:  The patient's family  history includes Colon polyps in her daughter; Diabetes in her mother; Heart disease in her father.    ROS:  Please see the history of present illness.   Otherwise, review of systems are positive for none.   All other systems are reviewed and negative.    PHYSICAL EXAM: VS:  BP (!) 146/65   Pulse (!) 50   Ht 5' 4" (1.626 m)   Wt 169 lb 9.6 oz (76.9 kg)   BMI 29.11 kg/m  , BMI Body mass index is 29.11 kg/m. GEN: Well nourished, well developed, in no acute distress  HEENT: normal  Neck: no JVD,  or masses. Bilateral carotid bruits. Cardiac: RRR; no rubs, or gallops,no edema . 2/6 systolic ejection murmur in the aortic area. Respiratory:  clear to auscultation bilaterally, normal work of breathing GI: soft, nontender, nondistended, + BS MS: no deformity or atrophy  Skin: warm and dry, no rash Neuro:  Strength and sensation are intact Psych: euthymic mood, full affect Vascular: Surgical scar is noted in both groins.  Femoral pulses normal bilaterally.  Distal pulses are palpable on the right side but not palpable on the left side.  EKG:  EKG is not ordered today.     Recent Labs: 08/01/2017: ALT 24 08/03/2017: Platelets 136 01/12/2018: BUN 79; Creatinine, Ser 2.60; Hemoglobin 13.6; Potassium 4.9; Sodium 137    Lipid Panel No results found for: CHOL, TRIG, HDL, CHOLHDL, VLDL, LDLCALC, LDLDIRECT    Wt Readings from Last 3 Encounters:  02/20/18 169 lb 9.6 oz (76.9 kg)  01/12/18 161 lb (73 kg)  01/01/18 163 lb 8 oz (74.2 kg)         ASSESSMENT AND PLAN:  1. Peripheral arterial disease status post successful surgical revascularization of bilateral lower extremities.   However, recent noninvasive vascular evaluation showed significant drop in ABI on the left side to 0.6 with moderate left common femoral artery disease and critical stenosis in the mid graft.  I do think that sudden closure of the left graft is possible which will likely lead to significant left lower extremity  ischemia.  I agree that she needs  angiography and possible endovascular intervention.  I discussed the case with Dr. Oneida Alar and I will go ahead and schedule the procedure for next week with CO2 in order to minimize the contrast and also will bring her early for hydration.  2. Coronary artery disease: She has no chest pain. Stable exertional dyspnea.  3. Essential hypertension: Blood pressure is mildly elevated.  We should consider switching metoprolol to carvedilol down the road.  4. Hyperlipidemia: Currently on atorvastatin and fenofibrate.  5. Bilateral carotid stenosis: carotid Doppler showed stable moderate left ICA stenosis.  Repeat study in February 2020.   Disposition:   FU with me in 2 months  Signed,  [name], MD  02/20/2018 8:15 AM    [place]

## 2018-02-20 NOTE — Progress Notes (Signed)
  Cardiology Office Note   Date:  02/20/2018   ID:  Nasra S Knipp, DOB 02/22/1956, MRN [id number]  PCP:  Beal, Sheri, PA-C  Cardiologist:   Dr. Crenshaw  No chief complaint on file.     History of Present Illness: [name] is a 62 y.o. female who presents for a followup visit regarding peripheral arterial disease. She has known history of  coronary artery status post coronary bypass graft surgery in 2004. She is known to have moderate bilateral carotid stenosis.  She also has prolonged history of diabetes with  chronic kidney disease and  diabetic neuropathy. 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  She is s/p left external iliac/common femoral and profunda endarterectomy with extended profundoplasty, ligation of the left superficial femoral artery and femoral to below the knee popliteal artery bypass with a vein graft in 05/18 and more recently right femoral to below-knee popliteal artery bypass in August, 2018.  Revascularization was performed for critical limb ischemia. She had GI bleed on small dose Xarelto which was started for peripheral arterial disease.  EGD was unremarkable.  She was taken off Xarelto.  She has been tolerating low-dose aspirin without bleeding issues.  She has been doing well with no recent chest pain or shortness of breath.  She has minimal left calf claudication.  However, she underwent noninvasive vascular evaluation with Dr. Fields in June which showed a drop in ABI to 0.6 on the left with moderate left common femoral artery stenosis and 70% stenosis in the mid segment of the graft.  The patient was scheduled for angiography with Dr. Fields but it was canceled due to worsening renal function.  Creatinine was 2.6. She saw Dr. Powell and the dose of furosemide was decreased to 40 mg once daily as she first felt to be volume depleted.  She is on no other nephrotoxic medications.  Past Medical History:  Diagnosis  Date  . Adenomatous polyp of colon   . Anemia   . Arthritis   . Asthma   . Carotid artery disease ([facility])   . Carpal tunnel syndrome   . Chronic renal insufficiency    Stage 3 Kidney disease  . Coronary artery disease   . Depression   . Depression with anxiety   . Diabetes mellitus    type II  . Diverticulosis of colon (without mention of hemorrhage)   . GERD (gastroesophageal reflux disease)   . Heart murmur   . History of hiatal hernia   . Hyperlipidemia   . Hypertension   . Hypothyroidism   . Obesity   . PVD (peripheral vascular disease) ([facility])   . Thyroid disease    hypo    Past Surgical History:  Procedure Laterality Date  . ABDOMINAL AORTAGRAM N/A 12/18/2013   Procedure: ABDOMINAL AORTAGRAM;  Surgeon: Muhammad A Arida, MD;  Location: [facility];  Service: Cardiovascular;  Laterality: N/A;  . ABDOMINAL AORTOGRAM W/LOWER EXTREMITY N/A 11/16/2016   Procedure: Abdominal Aortogram w/Lower Extremity;  Surgeon: Arida, Muhammad A, MD;  [facility] INV[facility];  Service: Cardiovascular;  Laterality: N/A;  . ABDOMINAL AORTOGRAM W/LOWER EXTREMITY N/A 01/25/2017   Procedure: Abdominal Aortogram w/Lower Extremity;  Surgeon: Arida, Muhammad A, MD;  [facility];  Service: Cardiovascular;  Laterality: N/A;  only completed Lower Extremity  . ABDOMINAL HYSTERECTOMY    . APPENDECTOMY    . APPLICATION OF WOUND VAC Right 02/13/2017     Procedure: APPLICATION OF INCISIONAL WOUND VAC RIGHT GROIN;  Surgeon: Fields, Charles E, MD;  [facility];  Service: Vascular;  Laterality: Right;  . CHOLECYSTECTOMY    . CORONARY ARTERY BYPASS GRAFT  2004   x4  . ENDARTERECTOMY FEMORAL Left 11/23/2016   Procedure: ENDARTERECTOMY OF LEFT EXTERNAL COMMON FEMORAL ARTERY WITH EXTENDED LEFT PROFUNDOPLASTY;  Surgeon: Fields, Charles E, MD;  [facility];  Service: Vascular;  Laterality: Left;  . ENDARTERECTOMY FEMORAL Right 02/13/2017   Procedure: ENDARTERECTOMY RIGHT FEMORAL ARTERY WITH  PROFUNDAPLASTY;  Surgeon: Fields, Charles E, MD;  [facility];  Service: Vascular;  Laterality: Right;  . ESOPHAGOGASTRODUODENOSCOPY N/A 08/02/2017   Procedure: ESOPHAGOGASTRODUODENOSCOPY (EGD);  Surgeon: Stark, Malcolm T, MD;  [facility];  Service: Endoscopy;  Laterality: N/A;  . FEMORAL-POPLITEAL BYPASS GRAFT Left 11/23/2016   Procedure: LEFT FEMORAL-BELOW KNEE POPLITEAL ARTERY BYPASS;  Surgeon: Fields, Charles E, MD;  [facility];  Service: Vascular;  Laterality: Left;  . FEMORAL-POPLITEAL BYPASS GRAFT Right 02/13/2017   Procedure: BYPASS GRAFT RIGHT FEMORAL-BELOW KNEE POPLITEAL ARTERY;  Surgeon: Fields, Charles E, MD;  [facility];  Service: Vascular;  Laterality: Right;  . KNEE SURGERY Right 2013   arthroscopy  . PATCH ANGIOPLASTY Right 02/13/2017   Procedure: VEIN PATCH ANGIOPLASTY RIGHT POPLITEAL ARTERY AND HEMASHIELD PATCH ANGIOPLASTY OF RIGHT FEMORAL ARTERY;  Surgeon: Fields, Charles E, MD;  [facility];  Service: Vascular;  Laterality: Right;     Current Outpatient Medications  Medication Sig Dispense Refill  . albuterol (PROVENTIL HFA;VENTOLIN HFA) 108 (90 BASE) MCG/ACT inhaler Inhale 2 puffs into the lungs every 6 (six) hours as needed for wheezing or shortness of breath.     . ALPRAZolam (XANAX) 1 MG tablet Take 1 mg by mouth 2 (two) times daily as needed for anxiety.     . amLODipine (NORVASC) 5 MG tablet Take 1 tablet (5 mg total) by mouth at bedtime. 90 tablet 3  . aspirin EC 81 MG tablet Take 81 mg by mouth at bedtime.    . atorvastatin (LIPITOR) 80 MG tablet Take 80 mg by mouth every morning.     . B-D INS SYR ULTRAFINE 1CC/31G 31G X 5/16" 1 ML MISC USE NEW NEEDLE FOR MEAL TIME 3 TIMES A DAY AND LONG ACTING INSULIN TWICE A DAY  11  . calcitRIOL (ROCALTROL) 0.25 MCG capsule Take 0.25 mcg by mouth 3 (three) times a week. Monday, Wednesday, Friday  6  . cetirizine (ZYRTEC) 10 MG tablet Take 10 mg by mouth daily.  6  . fenofibrate micronized (LOFIBRA) 200  MG capsule Take 200 mg by mouth at bedtime.     . ferrous sulfate 325 (65 FE) MG tablet Take 325 mg by mouth 3 (three) times daily.  3  . furosemide (LASIX) 40 MG tablet Take 40 mg by mouth 2 (two) times daily.     . Insulin Glargine (BASAGLAR KWIKPEN) 100 UNIT/ML SOPN Inject 20-60 Units into the skin 2 (two) times daily before a meal. Sliding Scale  3  . levothyroxine (SYNTHROID, LEVOTHROID) 150 MCG tablet Take 150 mcg by mouth daily before breakfast.    . loperamide (IMODIUM) 2 MG capsule Take 4 mg by mouth as needed for diarrhea or loose stools.    . metoprolol tartrate (LOPRESSOR) 100 MG tablet Take 100 mg by mouth 2 (two) times daily.   6  . NARCAN 4 MG/0.1ML LIQD nasal spray kit Place 1 spray into both nostrils See admin instructions. As needed in   case of an overdose  0  . Omega-3 Fatty Acids (FISH OIL) 1000 MG CAPS Take 1,000 mg by mouth 2 (two) times daily.    . omeprazole (PRILOSEC) 40 MG capsule Take 40 mg by mouth daily.    . ONETOUCH VERIO test strip CHECK SUGAR AT HOME 4-5 TIMES A DAY  11  . oxyCODONE (OXYCONTIN) 15 mg 12 hr tablet Take 15 mg by mouth every 12 (twelve) hours.    . oxyCODONE-acetaminophen (PERCOCET) 10-325 MG tablet Take 1 tablet by mouth 3 (three) times daily as needed.  0  . PARoxetine (PAXIL) 40 MG tablet Take 40 mg by mouth at bedtime.     . promethazine (PHENERGAN) 25 MG tablet Take 25 mg by mouth daily as needed for nausea or vomiting.   6  . Vitamin D, Ergocalciferol, (DRISDOL) 50000 UNITS CAPS capsule Take 50,000 Units by mouth once a week. mondays  1   No current facility-administered medications for this visit.     Allergies:   Patient has no known allergies.    Social History:  The patient  reports that she quit smoking about 19 months ago. Her smoking use included cigarettes. She has a 30.00 pack-year smoking history. She has never used smokeless tobacco. She reports that she does not drink alcohol or use drugs.   Family History:  The patient's family  history includes Colon polyps in her daughter; Diabetes in her mother; Heart disease in her father.    ROS:  Please see the history of present illness.   Otherwise, review of systems are positive for none.   All other systems are reviewed and negative.    PHYSICAL EXAM: VS:  BP (!) 146/65   Pulse (!) 50   Ht 5' 4" (1.626 m)   Wt 169 lb 9.6 oz (76.9 kg)   BMI 29.11 kg/m  , BMI Body mass index is 29.11 kg/m. GEN: Well nourished, well developed, in no acute distress  HEENT: normal  Neck: no JVD,  or masses. Bilateral carotid bruits. Cardiac: RRR; no rubs, or gallops,no edema . 2/6 systolic ejection murmur in the aortic area. Respiratory:  clear to auscultation bilaterally, normal work of breathing GI: soft, nontender, nondistended, + BS MS: no deformity or atrophy  Skin: warm and dry, no rash Neuro:  Strength and sensation are intact Psych: euthymic mood, full affect Vascular: Surgical scar is noted in both groins.  Femoral pulses normal bilaterally.  Distal pulses are palpable on the right side but not palpable on the left side.  EKG:  EKG is not ordered today.     Recent Labs: 08/01/2017: ALT 24 08/03/2017: Platelets 136 01/12/2018: BUN 79; Creatinine, Ser 2.60; Hemoglobin 13.6; Potassium 4.9; Sodium 137    Lipid Panel No results found for: CHOL, TRIG, HDL, CHOLHDL, VLDL, LDLCALC, LDLDIRECT    Wt Readings from Last 3 Encounters:  02/20/18 169 lb 9.6 oz (76.9 kg)  01/12/18 161 lb (73 kg)  01/01/18 163 lb 8 oz (74.2 kg)         ASSESSMENT AND PLAN:  1. Peripheral arterial disease status post successful surgical revascularization of bilateral lower extremities.   However, recent noninvasive vascular evaluation showed significant drop in ABI on the left side to 0.6 with moderate left common femoral artery disease and critical stenosis in the mid graft.  I do think that sudden closure of the left graft is possible which will likely lead to significant left lower extremity  ischemia.  I agree that she needs   angiography and possible endovascular intervention.  I discussed the case with Dr. Oneida Alar and I will go ahead and schedule the procedure for next week with CO2 in order to minimize the contrast and also will bring her early for hydration.  2. Coronary artery disease: She has no chest pain. Stable exertional dyspnea.  3. Essential hypertension: Blood pressure is mildly elevated.  We should consider switching metoprolol to carvedilol down the road.  4. Hyperlipidemia: Currently on atorvastatin and fenofibrate.  5. Bilateral carotid stenosis: carotid Doppler showed stable moderate left ICA stenosis.  Repeat study in February 2020.   Disposition:   FU with me in 2 months  Signed,  [name], MD  02/20/2018 8:15 AM    [place]

## 2018-02-20 NOTE — Telephone Encounter (Signed)
Patient called and informed of abdominal aortogram on 8/21 with Dr. Fletcher Anon. Patient has been instructed to be at [facility] at 6:30 that morning and to hold furosemide and her diabetic medications that morning.   She stated that she will have her pre procedure labs drawn tomorrow. Letter has been sent with all instructions. Patient has verbalized her understanding.

## 2018-02-22 ENCOUNTER — Ambulatory Visit (INDEPENDENT_AMBULATORY_CARE_PROVIDER_SITE_OTHER): Payer: Medicare Other | Admitting: Vascular Surgery

## 2018-02-22 ENCOUNTER — Encounter: Payer: Self-pay | Admitting: Vascular Surgery

## 2018-02-22 VITALS — BP 140/59 | HR 53 | Resp 18 | Ht 64.0 in | Wt 166.3 lb

## 2018-02-22 DIAGNOSIS — I739 Peripheral vascular disease, unspecified: Secondary | ICD-10-CM | POA: Diagnosis not present

## 2018-02-22 DIAGNOSIS — I779 Disorder of arteries and arterioles, unspecified: Secondary | ICD-10-CM

## 2018-02-22 NOTE — Progress Notes (Signed)
Patient is a 62 year old female who returns for follow-up today.  She was last seen June 2019 and noted to have a vein graft stenosis on the left side.  She was originally scheduled for an arteriogram but this was canceled due to renal dysfunction.  She now returns today for further follow-up.  In the meanwhile she has been seen by Dr. Fletcher Anon who currently has her scheduled for an arteriogram on August 21.  She denies any nonhealing wounds currently.  He does get some left calf claudication currently after walking 30 to 60 minutes.  Chronic medical problems include coronary artery disease diabetes asthma all of which are currently stable.  She has CKD 3 kidney disease.  Her most recent serum creatinine was 2.6 on January 12, 2018.  She had a left femoral endarterectomy and left femoral below-knee popliteal bypass with vein May 2018.  She subsequently had a right femoral to below-knee popliteal bypass with PTFE and a distal vein cuff August 2018.  She was recently noted on a surveillance graft scan to have a narrowing mid graft left leg.  Past Medical History:  Diagnosis Date  . Adenomatous polyp of colon   . Anemia   . Arthritis   . Asthma   . Carotid artery disease ([place])   . Carpal tunnel syndrome   . Chronic renal insufficiency    Stage 3 Kidney disease  . Coronary artery disease   . Depression   . Depression with anxiety   . Diabetes mellitus    type II  . Diverticulosis of colon (without mention of hemorrhage)   . GERD (gastroesophageal reflux disease)   . Heart murmur   . History of hiatal hernia   . Hyperlipidemia   . Hypertension   . Hypothyroidism   . Obesity   . PVD (peripheral vascular disease) ([place])   . Thyroid disease    hypo     Review of systems: She denies shortness of breath.  She denies chest pain.  Physical exam:  Vitals:   02/22/18 1113  BP: (!) 140/59  Pulse: (!) 53  Resp: 18  SpO2: 99%  Weight: 166 lb 4.8 oz (75.4 kg)  Height: 5\' 4"  (1.626 m)     Extremities: 1+ dorsalis pedis pulse left leg 2+ popliteal pulse, 2+ posterior tibial dorsalis pedis and popliteal pulse right leg  Skin: Callus Achilles area left and right side no open wound  Assessment: Patient most likely with a vein graft stenosis left leg with some mild claudication symptoms  Plan: Patient is scheduled for an arteriogram with Dr. Fletcher Anon next week possible angioplasty of the vein graft stenosis if necessary.  I have scheduled the patient for a follow-up appointment with me in 3 months time with repeat duplex ultrasounds and ABIs in anticipation that the vein graft stenosis will be fixed by Dr. Fletcher Anon next week.  Ruta Hinds, MD [facility] Office: [phone] Pager: [phone]

## 2018-02-23 ENCOUNTER — Encounter (HOSPITAL_COMMUNITY): Payer: Self-pay

## 2018-02-23 ENCOUNTER — Ambulatory Visit: Payer: Medicare Other | Admitting: Family

## 2018-02-26 ENCOUNTER — Other Ambulatory Visit: Payer: Self-pay

## 2018-02-26 DIAGNOSIS — I779 Disorder of arteries and arterioles, unspecified: Secondary | ICD-10-CM

## 2018-02-26 DIAGNOSIS — Z95828 Presence of other vascular implants and grafts: Secondary | ICD-10-CM

## 2018-02-26 DIAGNOSIS — I739 Peripheral vascular disease, unspecified: Secondary | ICD-10-CM

## 2018-02-27 ENCOUNTER — Telehealth: Payer: Self-pay | Admitting: *Deleted

## 2018-02-27 NOTE — Telephone Encounter (Signed)
Pt is returning your call

## 2018-02-27 NOTE — Telephone Encounter (Signed)
Patient stated she will head to the [facility] office to have the lab work completed immediately.

## 2018-02-27 NOTE — Telephone Encounter (Signed)
Left a message for the patient to call back. She was due to have lab work done for her procedure tomorrow.

## 2018-02-28 ENCOUNTER — Ambulatory Visit (HOSPITAL_COMMUNITY)
Admission: RE | Admit: 2018-02-28 | Discharge: 2018-03-01 | Disposition: A | Discharge status: Home / Self Care | Payer: Medicare Other | Source: Ambulatory Visit | Attending: Cardiovascular Disease | Admitting: Cardiovascular Disease

## 2018-02-28 ENCOUNTER — Encounter (HOSPITAL_COMMUNITY)
Admission: RE | Disposition: A | Discharge status: Home / Self Care | Payer: Self-pay | Source: Ambulatory Visit | Attending: Cardiovascular Disease

## 2018-02-28 DIAGNOSIS — Z87891 Personal history of nicotine dependence: Secondary | ICD-10-CM | POA: Diagnosis not present

## 2018-02-28 DIAGNOSIS — Z9889 Other specified postprocedural states: Secondary | ICD-10-CM | POA: Insufficient documentation

## 2018-02-28 DIAGNOSIS — Z7982 Long term (current) use of aspirin: Secondary | ICD-10-CM | POA: Insufficient documentation

## 2018-02-28 DIAGNOSIS — Z79899 Other long term (current) drug therapy: Secondary | ICD-10-CM | POA: Insufficient documentation

## 2018-02-28 DIAGNOSIS — I6523 Occlusion and stenosis of bilateral carotid arteries: Secondary | ICD-10-CM | POA: Insufficient documentation

## 2018-02-28 DIAGNOSIS — K219 Gastro-esophageal reflux disease without esophagitis: Secondary | ICD-10-CM | POA: Insufficient documentation

## 2018-02-28 DIAGNOSIS — Z794 Long term (current) use of insulin: Secondary | ICD-10-CM | POA: Diagnosis not present

## 2018-02-28 DIAGNOSIS — F418 Other specified anxiety disorders: Secondary | ICD-10-CM | POA: Diagnosis not present

## 2018-02-28 DIAGNOSIS — Z6829 Body mass index (BMI) 29.0-29.9, adult: Secondary | ICD-10-CM | POA: Insufficient documentation

## 2018-02-28 DIAGNOSIS — Z8371 Family history of colonic polyps: Secondary | ICD-10-CM | POA: Insufficient documentation

## 2018-02-28 DIAGNOSIS — E039 Hypothyroidism, unspecified: Secondary | ICD-10-CM | POA: Diagnosis not present

## 2018-02-28 DIAGNOSIS — Y832 Surgical operation with anastomosis, bypass or graft as the cause of abnormal reaction of the patient, or of later complication, without mention of misadventure at the time of the procedure: Secondary | ICD-10-CM | POA: Insufficient documentation

## 2018-02-28 DIAGNOSIS — G5600 Carpal tunnel syndrome, unspecified upper limb: Secondary | ICD-10-CM | POA: Insufficient documentation

## 2018-02-28 DIAGNOSIS — E785 Hyperlipidemia, unspecified: Secondary | ICD-10-CM | POA: Diagnosis not present

## 2018-02-28 DIAGNOSIS — N183 Chronic kidney disease, stage 3 (moderate): Secondary | ICD-10-CM | POA: Diagnosis not present

## 2018-02-28 DIAGNOSIS — I129 Hypertensive chronic kidney disease with stage 1 through stage 4 chronic kidney disease, or unspecified chronic kidney disease: Secondary | ICD-10-CM | POA: Diagnosis not present

## 2018-02-28 DIAGNOSIS — Z833 Family history of diabetes mellitus: Secondary | ICD-10-CM | POA: Insufficient documentation

## 2018-02-28 DIAGNOSIS — I70213 Atherosclerosis of native arteries of extremities with intermittent claudication, bilateral legs: Secondary | ICD-10-CM | POA: Insufficient documentation

## 2018-02-28 DIAGNOSIS — I739 Peripheral vascular disease, unspecified: Secondary | ICD-10-CM | POA: Diagnosis present

## 2018-02-28 DIAGNOSIS — Z86010 Personal history of colonic polyps: Secondary | ICD-10-CM | POA: Diagnosis not present

## 2018-02-28 DIAGNOSIS — E1122 Type 2 diabetes mellitus with diabetic chronic kidney disease: Secondary | ICD-10-CM | POA: Insufficient documentation

## 2018-02-28 DIAGNOSIS — Z8249 Family history of ischemic heart disease and other diseases of the circulatory system: Secondary | ICD-10-CM | POA: Insufficient documentation

## 2018-02-28 DIAGNOSIS — E1140 Type 2 diabetes mellitus with diabetic neuropathy, unspecified: Secondary | ICD-10-CM | POA: Diagnosis not present

## 2018-02-28 DIAGNOSIS — M1990 Unspecified osteoarthritis, unspecified site: Secondary | ICD-10-CM | POA: Insufficient documentation

## 2018-02-28 DIAGNOSIS — Z951 Presence of aortocoronary bypass graft: Secondary | ICD-10-CM | POA: Insufficient documentation

## 2018-02-28 DIAGNOSIS — T82858A Stenosis of vascular prosthetic devices, implants and grafts, initial encounter: Secondary | ICD-10-CM | POA: Insufficient documentation

## 2018-02-28 DIAGNOSIS — Z9049 Acquired absence of other specified parts of digestive tract: Secondary | ICD-10-CM | POA: Insufficient documentation

## 2018-02-28 DIAGNOSIS — J45909 Unspecified asthma, uncomplicated: Secondary | ICD-10-CM | POA: Insufficient documentation

## 2018-02-28 DIAGNOSIS — Z90710 Acquired absence of both cervix and uterus: Secondary | ICD-10-CM | POA: Insufficient documentation

## 2018-02-28 DIAGNOSIS — Z79890 Hormone replacement therapy (postmenopausal): Secondary | ICD-10-CM | POA: Diagnosis not present

## 2018-02-28 DIAGNOSIS — I70302 Unspecified atherosclerosis of unspecified type of bypass graft(s) of the extremities, left leg: Secondary | ICD-10-CM | POA: Diagnosis not present

## 2018-02-28 DIAGNOSIS — E669 Obesity, unspecified: Secondary | ICD-10-CM | POA: Insufficient documentation

## 2018-02-28 HISTORY — PX: ABDOMINAL AORTOGRAM W/LOWER EXTREMITY: CATH118223

## 2018-02-28 HISTORY — PX: PERIPHERAL VASCULAR BALLOON ANGIOPLASTY: CATH118281

## 2018-02-28 LAB — BASIC METABOLIC PANEL
BUN/Creatinine Ratio: 16 (ref 12–28)
BUN: 33 mg/dL — AB (ref 8–27)
CALCIUM: 9 mg/dL (ref 8.7–10.3)
CO2: 25 mmol/L (ref 20–29)
CREATININE: 2.04 mg/dL — AB (ref 0.57–1.00)
Chloride: 103 mmol/L (ref 96–106)
GFR calc Af Amer: 29 mL/min/{1.73_m2} — ABNORMAL LOW (ref >59)
GFR, EST NON AFRICAN AMERICAN: 25 mL/min/{1.73_m2} — AB (ref >59)
GLUCOSE: 142 mg/dL — AB (ref 65–99)
Potassium: 5.8 mmol/L — ABNORMAL HIGH (ref 3.5–5.2)
Sodium: 140 mmol/L (ref 134–144)

## 2018-02-28 LAB — CBC
HEMOGLOBIN: 13 g/dL (ref 11.1–15.9)
Hematocrit: 39.4 % (ref 34.0–46.6)
MCH: 29.9 pg (ref 26.6–33.0)
MCHC: 33 g/dL (ref 31.5–35.7)
MCV: 91 fL (ref 79–97)
Platelets: 234 10*3/uL (ref 150–450)
RBC: 4.35 x10E6/uL (ref 3.77–5.28)
RDW: 13.9 % (ref 12.3–15.4)
WBC: 9.3 10*3/uL (ref 3.4–10.8)

## 2018-02-28 LAB — GLUCOSE, CAPILLARY
GLUCOSE-CAPILLARY: 118 mg/dL — AB (ref 70–99)
GLUCOSE-CAPILLARY: 123 mg/dL — AB (ref 70–99)
GLUCOSE-CAPILLARY: 57 mg/dL — AB (ref 70–99)
GLUCOSE-CAPILLARY: 60 mg/dL — AB (ref 70–99)
Glucose-Capillary: 65 mg/dL — ABNORMAL LOW (ref 70–99)
Glucose-Capillary: 31 mg/dL — CL (ref 70–99)
Glucose-Capillary: 160 mg/dL — ABNORMAL HIGH (ref 70–99)
Glucose-Capillary: 163 mg/dL — ABNORMAL HIGH (ref 70–99)
Glucose-Capillary: 64 mg/dL — ABNORMAL LOW (ref 70–99)
Glucose-Capillary: 212 mg/dL — ABNORMAL HIGH (ref 70–99)

## 2018-02-28 LAB — POCT ACTIVATED CLOTTING TIME: Activated Clotting Time: 246 seconds

## 2018-02-28 LAB — POTASSIUM: Potassium: 4.1 mmol/L (ref 3.5–5.1)

## 2018-02-28 SURGERY — ABDOMINAL AORTOGRAM W/LOWER EXTREMITY
Anesthesia: LOCAL

## 2018-02-28 MED ORDER — PAROXETINE HCL 20 MG PO TABS
60.0000 mg | ORAL_TABLET | Freq: Every day | ORAL | Status: DC
  Administered 2018-02-28: 22:00:00 60 mg via ORAL
  Filled 2018-02-28: qty 3

## 2018-02-28 MED ORDER — FENTANYL CITRATE (PF) 100 MCG/2ML IJ SOLN
INTRAMUSCULAR | Status: DC | PRN
  Administered 2018-02-28 (×2): 25 ug via INTRAVENOUS
  Administered 2018-02-28: 50 ug via INTRAVENOUS

## 2018-02-28 MED ORDER — DEXTROSE 50 % IV SOLN
INTRAVENOUS | Status: AC
  Administered 2018-02-28: 25 mL via INTRAVENOUS
  Filled 2018-02-28: qty 50

## 2018-02-28 MED ORDER — ACETAMINOPHEN 325 MG PO TABS: 650.0000 mg | ORAL_TABLET | ORAL | Status: DC | PRN

## 2018-02-28 MED ORDER — ASPIRIN EC 81 MG PO TBEC
81.0000 mg | DELAYED_RELEASE_TABLET | Freq: Every day | ORAL | Status: DC
  Administered 2018-02-28: 81 mg via ORAL
  Filled 2018-02-28: qty 1

## 2018-02-28 MED ORDER — AMLODIPINE BESYLATE 5 MG PO TABS
5.0000 mg | ORAL_TABLET | Freq: Every day | ORAL | Status: DC
  Administered 2018-02-28: 5 mg via ORAL
  Filled 2018-02-28: qty 1

## 2018-02-28 MED ORDER — FENTANYL CITRATE (PF) 100 MCG/2ML IJ SOLN
INTRAMUSCULAR | Status: AC
  Filled 2018-02-28: qty 2

## 2018-02-28 MED ORDER — HEPARIN (PORCINE) IN NACL 1000-0.9 UT/500ML-% IV SOLN
INTRAVENOUS | Status: DC | PRN
  Administered 2018-02-28 (×2): 500 mL

## 2018-02-28 MED ORDER — SODIUM CHLORIDE 0.9% FLUSH: 3.0000 mL | INTRAVENOUS | Status: DC | PRN

## 2018-02-28 MED ORDER — FUROSEMIDE 40 MG PO TABS
40.0000 mg | ORAL_TABLET | Freq: Every day | ORAL | Status: DC
  Administered 2018-03-01: 40 mg via ORAL
  Filled 2018-02-28: qty 1

## 2018-02-28 MED ORDER — PREDNISOLONE ACETATE 1 % OP SUSP
1.0000 [drp] | Freq: Two times a day (BID) | OPHTHALMIC | Status: DC
  Filled 2018-02-28: qty 5

## 2018-02-28 MED ORDER — NALOXONE HCL 4 MG/0.1ML NA LIQD: 0.4000 mg | Freq: Once | NASAL | Status: DC

## 2018-02-28 MED ORDER — VITAMIN D (ERGOCALCIFEROL) 1.25 MG (50000 UNIT) PO CAPS: 50000.0000 [IU] | ORAL_CAPSULE | ORAL | Status: DC

## 2018-02-28 MED ORDER — FERROUS SULFATE 325 (65 FE) MG PO TABS
325.0000 mg | ORAL_TABLET | Freq: Three times a day (TID) | ORAL | Status: DC
  Administered 2018-02-28 – 2018-03-01 (×2): 325 mg via ORAL
  Filled 2018-02-28 (×2): qty 1

## 2018-02-28 MED ORDER — DEXTROSE-NACL 5-0.45 % IV SOLN
INTRAVENOUS | Status: DC
  Filled 2018-02-28 (×3): qty 1000

## 2018-02-28 MED ORDER — OMEGA-3-ACID ETHYL ESTERS 1 G PO CAPS
1.0000 g | ORAL_CAPSULE | Freq: Two times a day (BID) | ORAL | Status: DC
  Administered 2018-02-28 – 2018-03-01 (×2): 1 g via ORAL
  Filled 2018-02-28 (×2): qty 1

## 2018-02-28 MED ORDER — HYDRALAZINE HCL 20 MG/ML IJ SOLN
5.0000 mg | INTRAMUSCULAR | Status: DC | PRN
  Administered 2018-02-28: 19:00:00 5 mg via INTRAVENOUS
  Filled 2018-02-28: qty 1

## 2018-02-28 MED ORDER — LORATADINE 10 MG PO TABS
10.0000 mg | ORAL_TABLET | Freq: Every day | ORAL | Status: DC
  Administered 2018-03-01: 12:00:00 10 mg via ORAL
  Filled 2018-02-28: qty 1

## 2018-02-28 MED ORDER — OXYCODONE-ACETAMINOPHEN 5-325 MG PO TABS
1.0000 | ORAL_TABLET | Freq: Four times a day (QID) | ORAL | Status: DC | PRN
  Administered 2018-02-28: 18:00:00 1 via ORAL
  Filled 2018-02-28: qty 1

## 2018-02-28 MED ORDER — ALBUTEROL SULFATE (2.5 MG/3ML) 0.083% IN NEBU: 2.5000 mg | INHALATION_SOLUTION | Freq: Four times a day (QID) | RESPIRATORY_TRACT | Status: DC | PRN

## 2018-02-28 MED ORDER — SODIUM CHLORIDE 0.9 % IV SOLN: 250.0000 mL | INTRAVENOUS | Status: DC | PRN

## 2018-02-28 MED ORDER — OXYCODONE HCL 5 MG PO TABS: 5.0000 mg | ORAL_TABLET | Freq: Four times a day (QID) | ORAL | Status: DC | PRN

## 2018-02-28 MED ORDER — CLOPIDOGREL BISULFATE 300 MG PO TABS
ORAL_TABLET | ORAL | Status: DC | PRN
  Administered 2018-02-28: 300 mg via ORAL

## 2018-02-28 MED ORDER — INSULIN GLARGINE 100 UNIT/ML ~~LOC~~ SOLN
20.0000 [IU] | Freq: Two times a day (BID) | SUBCUTANEOUS | Status: DC
  Filled 2018-02-28 (×3): qty 0.2

## 2018-02-28 MED ORDER — METOPROLOL TARTRATE 25 MG PO TABS
150.0000 mg | ORAL_TABLET | Freq: Two times a day (BID) | ORAL | Status: DC
  Administered 2018-02-28 – 2018-03-01 (×2): 150 mg via ORAL
  Filled 2018-02-28 (×2): qty 6

## 2018-02-28 MED ORDER — PROMETHAZINE HCL 25 MG PO TABS: 25.0000 mg | ORAL_TABLET | Freq: Every day | ORAL | Status: DC | PRN

## 2018-02-28 MED ORDER — HEPARIN SODIUM (PORCINE) 1000 UNIT/ML IJ SOLN
INTRAMUSCULAR | Status: DC | PRN
  Administered 2018-02-28: 7000 [IU] via INTRAVENOUS

## 2018-02-28 MED ORDER — ONDANSETRON HCL 4 MG/2ML IJ SOLN: 4.0000 mg | Freq: Four times a day (QID) | INTRAMUSCULAR | Status: DC | PRN

## 2018-02-28 MED ORDER — OXYCODONE HCL ER 15 MG PO T12A
15.0000 mg | EXTENDED_RELEASE_TABLET | Freq: Two times a day (BID) | ORAL | Status: DC
  Administered 2018-02-28 – 2018-03-01 (×2): 15 mg via ORAL
  Filled 2018-02-28 (×2): qty 1

## 2018-02-28 MED ORDER — SODIUM CHLORIDE 0.9 % WEIGHT BASED INFUSION
1.0000 mL/kg/h | INTRAVENOUS | Status: AC
  Administered 2018-02-28: 1 mL/kg/h via INTRAVENOUS

## 2018-02-28 MED ORDER — FISH OIL 1000 MG PO CAPS: 1000.0000 mg | ORAL_CAPSULE | Freq: Two times a day (BID) | ORAL | Status: DC

## 2018-02-28 MED ORDER — ALPRAZOLAM 0.5 MG PO TABS: 1.0000 mg | ORAL_TABLET | Freq: Two times a day (BID) | ORAL | Status: DC | PRN

## 2018-02-28 MED ORDER — BASAGLAR KWIKPEN 100 UNIT/ML ~~LOC~~ SOPN: 20.0000 [IU] | PEN_INJECTOR | Freq: Two times a day (BID) | SUBCUTANEOUS | Status: DC

## 2018-02-28 MED ORDER — ATORVASTATIN CALCIUM 80 MG PO TABS
80.0000 mg | ORAL_TABLET | Freq: Every day | ORAL | Status: DC
  Administered 2018-03-01: 80 mg via ORAL
  Filled 2018-02-28 (×2): qty 1

## 2018-02-28 MED ORDER — LOPERAMIDE HCL 2 MG PO CAPS: 4.0000 mg | ORAL_CAPSULE | ORAL | Status: DC | PRN

## 2018-02-28 MED ORDER — SODIUM CHLORIDE 0.9% FLUSH
3.0000 mL | Freq: Two times a day (BID) | INTRAVENOUS | Status: DC
  Administered 2018-03-01: 02:00:00 3 mL via INTRAVENOUS

## 2018-02-28 MED ORDER — DEXTROSE 50 % IV SOLN
25.0000 mL | Freq: Once | INTRAVENOUS | Status: AC
  Administered 2018-02-28 (×3): 25 mL via INTRAVENOUS

## 2018-02-28 MED ORDER — HEPARIN SODIUM (PORCINE) 1000 UNIT/ML IJ SOLN
INTRAMUSCULAR | Status: AC
  Filled 2018-02-28: qty 1

## 2018-02-28 MED ORDER — ANGIOPLASTY BOOK
Freq: Once | Status: AC
  Administered 2018-02-28: 22:00:00
  Filled 2018-02-28: qty 1

## 2018-02-28 MED ORDER — OXYCODONE-ACETAMINOPHEN 10-325 MG PO TABS: 1.0000 | ORAL_TABLET | Freq: Four times a day (QID) | ORAL | Status: DC | PRN

## 2018-02-28 MED ORDER — HYDRALAZINE HCL 20 MG/ML IJ SOLN
INTRAMUSCULAR | Status: AC
  Filled 2018-02-28: qty 1

## 2018-02-28 MED ORDER — CLOPIDOGREL BISULFATE 300 MG PO TABS
ORAL_TABLET | ORAL | Status: AC
  Filled 2018-02-28: qty 1

## 2018-02-28 MED ORDER — SODIUM CHLORIDE 0.9% FLUSH: 3.0000 mL | Freq: Two times a day (BID) | INTRAVENOUS | Status: DC

## 2018-02-28 MED ORDER — LIDOCAINE HCL (PF) 1 % IJ SOLN
INTRAMUSCULAR | Status: DC | PRN
  Administered 2018-02-28: 22 mL

## 2018-02-28 MED ORDER — DEXTROSE 50 % IV SOLN
25.0000 mL | Freq: Once | INTRAVENOUS | Status: AC
  Administered 2018-02-28: 22:00:00 25 mL via INTRAVENOUS

## 2018-02-28 MED ORDER — HYDRALAZINE HCL 20 MG/ML IJ SOLN
INTRAMUSCULAR | Status: DC | PRN
  Administered 2018-02-28: 10 mg via INTRAVENOUS

## 2018-02-28 MED ORDER — HEPARIN (PORCINE) IN NACL 1000-0.9 UT/500ML-% IV SOLN
INTRAVENOUS | Status: AC
  Filled 2018-02-28: qty 1000

## 2018-02-28 MED ORDER — ASPIRIN 81 MG PO CHEW: 81.0000 mg | CHEWABLE_TABLET | ORAL | Status: DC

## 2018-02-28 MED ORDER — MIDAZOLAM HCL 2 MG/2ML IJ SOLN
INTRAMUSCULAR | Status: AC
  Filled 2018-02-28: qty 2

## 2018-02-28 MED ORDER — PANTOPRAZOLE SODIUM 40 MG PO TBEC
40.0000 mg | DELAYED_RELEASE_TABLET | Freq: Every day | ORAL | Status: DC
  Administered 2018-03-01: 40 mg via ORAL
  Filled 2018-02-28: qty 1

## 2018-02-28 MED ORDER — LEVOTHYROXINE SODIUM 75 MCG PO TABS
150.0000 ug | ORAL_TABLET | Freq: Every day | ORAL | Status: DC
  Administered 2018-03-01: 07:00:00 150 ug via ORAL
  Filled 2018-02-28: qty 2

## 2018-02-28 MED ORDER — DEXTROSE 50 % IV SOLN
INTRAVENOUS | Status: AC
  Filled 2018-02-28: qty 50

## 2018-02-28 MED ORDER — MIDAZOLAM HCL 2 MG/2ML IJ SOLN
INTRAMUSCULAR | Status: DC | PRN
  Administered 2018-02-28 (×2): 1 mg via INTRAVENOUS

## 2018-02-28 MED ORDER — SODIUM CHLORIDE 0.9 % IV SOLN
INTRAVENOUS | Status: DC
  Administered 2018-02-28: 08:00:00 via INTRAVENOUS

## 2018-02-28 MED ORDER — ALBUTEROL SULFATE HFA 108 (90 BASE) MCG/ACT IN AERS: 2.0000 | INHALATION_SPRAY | Freq: Four times a day (QID) | RESPIRATORY_TRACT | Status: DC | PRN

## 2018-02-28 MED ORDER — LIDOCAINE HCL (PF) 1 % IJ SOLN
INTRAMUSCULAR | Status: AC
  Filled 2018-02-28: qty 30

## 2018-02-28 MED ORDER — CLOPIDOGREL BISULFATE 75 MG PO TABS
75.0000 mg | ORAL_TABLET | Freq: Every day | ORAL | Status: DC
  Administered 2018-03-01: 75 mg via ORAL
  Filled 2018-02-28: qty 1

## 2018-02-28 MED ORDER — CALCITRIOL 0.25 MCG PO CAPS: 0.2500 ug | ORAL_CAPSULE | ORAL | Status: DC

## 2018-02-28 MED ORDER — FENOFIBRATE 160 MG PO TABS
160.0000 mg | ORAL_TABLET | Freq: Every day | ORAL | Status: DC
  Administered 2018-03-01: 160 mg via ORAL
  Filled 2018-02-28: qty 1

## 2018-02-28 SURGICAL SUPPLY — 20 items
BALLN LUTONIX DCB 5X40X130 (BALLOONS) ×3
BALLOON LUTONIX DCB 5X40X130 (BALLOONS) ×2 IMPLANT
CATH OMNI FLUSH 5F 65CM (CATHETERS) ×3 IMPLANT
KIT ENCORE 26 ADVANTAGE (KITS) ×3 IMPLANT
KIT MICROPUNCTURE NIT STIFF (SHEATH) ×3 IMPLANT
KIT PV (KITS) ×3 IMPLANT
SHEATH HIGHFLEX ANSEL 6FRX55 (SHEATH) ×3 IMPLANT
SHEATH PINNACLE 5F 10CM (SHEATH) ×3 IMPLANT
SHEATH PINNACLE 7F 10CM (SHEATH) ×3 IMPLANT
SHEATH PINNACLE ST 6F 45CM (SHEATH) ×3 IMPLANT
SHEATH PROBE COVER 6X72 (BAG) ×3 IMPLANT
STOPCOCK MORSE 400PSI 3WAY (MISCELLANEOUS) ×3 IMPLANT
TAPE VIPERTRACK RADIOPAQ (MISCELLANEOUS) ×2 IMPLANT
TAPE VIPERTRACK RADIOPAQUE (MISCELLANEOUS) ×1
TRANSDUCER W/STOPCOCK (MISCELLANEOUS) ×3 IMPLANT
TRAY PV CATH (CUSTOM PROCEDURE TRAY) ×3 IMPLANT
TUBING CIL FLEX 10 FLL-RA (TUBING) ×3 IMPLANT
WIRE BENTSON .035X145CM (WIRE) ×3 IMPLANT
WIRE HI TORQ VERSACORE J 260CM (WIRE) ×3 IMPLANT
WIRE ROSEN-J .035X260CM (WIRE) ×3 IMPLANT

## 2018-02-28 NOTE — Progress Notes (Signed)
Barbaraann Rondo in Cath lab notified of pt's unstable blood sugar,  Cath lab to recheck CBG per protocol

## 2018-02-28 NOTE — Progress Notes (Signed)
Pt's CBG 57.  Dr Fletcher Anon notified. Retreated.

## 2018-02-28 NOTE — Progress Notes (Signed)
Pt's CBG is 65. Hypoglycemia protocol initiated

## 2018-02-28 NOTE — Progress Notes (Addendum)
Pt's CBG continues to drop after treatment. Dr Fletcher Anon notified   Pt treated with D50 and D5,45 hung at 100cchr.

## 2018-02-28 NOTE — Interval H&P Note (Signed)
History and Physical Interval Note:  02/28/2018 1:53 PM  [name]  has presented today for surgery, with the diagnosis of pad  The various methods of treatment have been discussed with the patient and family. After consideration of risks, benefits and other options for treatment, the patient has consented to  Procedure(s): ABDOMINAL AORTOGRAM W/LOWER EXTREMITY (N/A) as a surgical intervention .  The patient's history has been reviewed, patient examined, no change in status, stable for surgery.  I have reviewed the patient's chart and labs.  Questions were answered to the patient's satisfaction.     [name]

## 2018-03-01 ENCOUNTER — Telehealth: Payer: Self-pay | Admitting: Cardiology

## 2018-03-01 ENCOUNTER — Other Ambulatory Visit: Payer: Self-pay | Admitting: Physician Assistant

## 2018-03-01 ENCOUNTER — Other Ambulatory Visit: Payer: Self-pay

## 2018-03-01 ENCOUNTER — Encounter (HOSPITAL_COMMUNITY): Payer: Self-pay | Admitting: Cardiovascular Disease

## 2018-03-01 ENCOUNTER — Ambulatory Visit (HOSPITAL_BASED_OUTPATIENT_CLINIC_OR_DEPARTMENT_OTHER): Payer: Medicare Other

## 2018-03-01 DIAGNOSIS — R0989 Other specified symptoms and signs involving the circulatory and respiratory systems: Secondary | ICD-10-CM | POA: Diagnosis not present

## 2018-03-01 DIAGNOSIS — I739 Peripheral vascular disease, unspecified: Secondary | ICD-10-CM

## 2018-03-01 DIAGNOSIS — N183 Chronic kidney disease, stage 3 unspecified: Secondary | ICD-10-CM

## 2018-03-01 DIAGNOSIS — T82858A Stenosis of vascular prosthetic devices, implants and grafts, initial encounter: Secondary | ICD-10-CM | POA: Diagnosis not present

## 2018-03-01 LAB — BASIC METABOLIC PANEL
Anion gap: 4 — ABNORMAL LOW (ref 5–15)
BUN: 31 mg/dL — ABNORMAL HIGH (ref 8–23)
CALCIUM: 8.2 mg/dL — AB (ref 8.9–10.3)
CHLORIDE: 110 mmol/L (ref 98–111)
CO2: 26 mmol/L (ref 22–32)
Creatinine, Ser: 1.92 mg/dL — ABNORMAL HIGH (ref 0.44–1.00)
GFR calc non Af Amer: 27 mL/min — ABNORMAL LOW (ref >60)
GFR, EST AFRICAN AMERICAN: 31 mL/min — AB (ref >60)
Glucose, Bld: 198 mg/dL — ABNORMAL HIGH (ref 70–99)
Potassium: 4.5 mmol/L (ref 3.5–5.1)
Sodium: 140 mmol/L (ref 135–145)

## 2018-03-01 LAB — GLUCOSE, CAPILLARY
Glucose-Capillary: 112 mg/dL — ABNORMAL HIGH (ref 70–99)
Glucose-Capillary: 78 mg/dL (ref 70–99)

## 2018-03-01 MED ORDER — CLOPIDOGREL BISULFATE 75 MG PO TABS: 75.0000 mg | ORAL_TABLET | Freq: Every day | ORAL | 0 refills | Status: DC

## 2018-03-01 NOTE — Progress Notes (Signed)
Inpatient Diabetes Program Recommendations  AACE/ADA: New Consensus Statement on Inpatient Glycemic Control (2019)  Target Ranges:  Prepandial:   less than 140 mg/dL      Peak postprandial:   less than 180 mg/dL (1-2 hours)      Critically ill patients:  140 - 180 mg/dL  Results for [name], [name] (MRN [id number]) as of 03/01/2018 12:46  Ref. Range 03/01/2018 06:26 03/01/2018 12:31  Glucose-Capillary Latest Ref Range: 70 - 99 mg/dL 112 (H) 78   Results for [name], [name] (MRN [id number]) as of 03/01/2018 12:46  Ref. Range 02/28/2018 06:55 02/28/2018 09:10 02/28/2018 09:49 02/28/2018 12:06 02/28/2018 13:12 02/28/2018 13:41 02/28/2018 15:08 02/28/2018 20:29 02/28/2018 21:17 02/28/2018 21:49  Glucose-Capillary Latest Ref Range: 70 - 99 mg/dL 118 (H) 65 (L) 123 (H) 31 (LL) 57 (L) 160 (H) 163 (H) 64 (L) 60 (L) 212 (H)   Review of Glycemic Control  Diabetes history: DM2 Outpatient Diabetes medications:Basaglar 20-60 units BID (based on correction scale) Current orders for Inpatient glycemic control: Lantus 20 units BID  Inpatient Diabetes Program Recommendations:  Insulin - Basal: NO Lantus has been given since admitted. Please discontinue Lantus. Correction (SSI): While inpatient, please consider ordering CBGs with Novolog 0-9 units TID with meals and Novolog 0-5 units QHS.  Thanks, Barnie Alderman, RN, MSN, CDE Diabetes Coordinator Inpatient Diabetes Program [phone] (Team Pager from 8am to 5pm)

## 2018-03-01 NOTE — Telephone Encounter (Signed)
Called patient and LVM to call back and schedule appt in 3 months with Dr. Stanford Breed.

## 2018-03-01 NOTE — Discharge Summary (Signed)
Discharge Summary    Patient ID: [name],  MRN: [id number], DOB/AGE: 1956-01-04 62 y.o.  Admit date: 02/28/2018 Discharge date: 03/01/2018  Primary Care Provider: Nicholes Rough Primary Cardiologist: Dr. Fletcher Anon   Discharge Diagnoses    Active Problems:   PAD (peripheral artery disease) (HCC)   Allergies No Known Allergies  Diagnostic Studies/Procedures    02/28/2018 PERIPHERAL VASCULAR BALLOON ANGIOPLASTY  ABDOMINAL AORTOGRAM W/LOWER EXTREMITY  1.  No significant aortoiliac disease. 2.  Left lower extremity: Patent left femoral to below the knee popliteal artery bypass with discrete 80% stenosis in the mid to distal segment. 3.  Successful drug-coated balloon angioplasty of the left femoropopliteal bypass.  Difficulty in advancing sheaths via the groin due to significant scar tissue Recommendations: Dual antiplatelet therapy for 1 month. Continue aggressive treatment of risk factors. Gentle hydration overnight due to chronic kidney disease.  Only 30 mL of contrast was used.  03/01/2018 VAS [place] GROIN PSEUDOANEURYSM Preliminary results show no evidence of pseudoaneurysm or AV fistula _____________   History of Present Illness     62 y.o. female with past medical history of coronary artery disease status post coronary artery bypass and graft, stage III chronic kidney disease, peripheral vascular disease, diabetes mellitus, hypertension, hyperlipidemia was admitted 8/21 for PV cath and possible angioplasty of left femoral-popliteal bypass.  Hospital Course     Consultants: None  Ms. Enns tolerated the procedure well.  8/22, she was examined by Dr. Stanford Breed.  She had a 2+ pulse on the left, but had a right groin bruit.  An ultrasound was performed, but showed no pseudoaneurysm or AV fistula.  She is to continue aspirin, Plavix and statin.  Per Dr. Tyrell Antonio note, the Plavix is only for 30 days.  She was not having any ischemic symptoms.  She is to continue on her home  medications for CAD, hypertension, diabetes, and hyperlipidemia.  Her carotid disease is considered stable and she is for repeat carotid Dopplers in February 2020.  Her renal function was stable post cath and minimal dye was used.  Recheck next Monday. _____________  Discharge Vitals Blood pressure (!) 182/86, pulse (!) 55, temperature 98.5 F (36.9 C), temperature source Oral, resp. rate 16, height '5\' 5"'  (1.651 m), weight 75 kg, SpO2 98 %.  Filed Weights   02/28/18 0652 03/01/18 0405  Weight: 74.8 kg 75 kg    Labs & Radiologic Studies    CBC Recent Labs    02/27/18 1400  WBC 9.3  HGB 13.0  HCT 39.4  MCV 91  PLT 416   Basic Metabolic Panel Recent Labs    02/27/18 1400 02/28/18 0718 03/01/18 0347  NA 140  --  140  K 5.8* 4.1 4.5  CL 103  --  110  CO2 25  --  26  GLUCOSE 142*  --  198*  BUN 33*  --  31*  CREATININE 2.04*  --  1.92*  CALCIUM 9.0  --  8.2*   _____________   Disposition   Pt is being discharged home today in good condition.  Follow-up Plans & Appointments    Follow-up Information    Wellington Hampshire, MD Follow up on 03/05/2018.   Specialty:  Cardiology Why:  Come in to get blood work done, see instructions.  Contact information: [address] [place] [address] [phone]          Discharge Instructions    Diet - low sodium heart healthy   Complete  by:  As directed    Increase activity slowly   Complete by:  As directed       Discharge Medications   Allergies as of 03/01/2018   No Known Allergies     Medication List    TAKE these medications   albuterol 108 (90 Base) MCG/ACT inhaler Commonly known as:  PROVENTIL HFA;VENTOLIN HFA Inhale 2 puffs into the lungs every 6 (six) hours as needed for wheezing or shortness of breath.   ALPRAZolam 1 MG tablet Commonly known as:  XANAX Take 1 mg by mouth 2 (two) times daily as needed for anxiety.   amLODipine 5 MG tablet Commonly known as:  NORVASC Take 1  tablet (5 mg total) by mouth at bedtime.   aspirin EC 81 MG tablet Take 81 mg by mouth at bedtime.   atorvastatin 80 MG tablet Commonly known as:  LIPITOR Take 80 mg by mouth daily.   BASAGLAR KWIKPEN 100 UNIT/ML Sopn Inject 20-60 Units into the skin 2 (two) times daily before a meal. Per sliding scale   calcitRIOL 0.25 MCG capsule Commonly known as:  ROCALTROL Take 0.25 mcg by mouth every Monday, Wednesday, and Friday.   cetirizine 10 MG tablet Commonly known as:  ZYRTEC Take 10 mg by mouth daily.   clopidogrel 75 MG tablet Commonly known as:  PLAVIX Take 1 tablet (75 mg total) by mouth daily with breakfast. Start taking on:  03/02/2018   fenofibrate micronized 200 MG capsule Commonly known as:  LOFIBRA Take 200 mg by mouth at bedtime.   ferrous sulfate 325 (65 FE) MG tablet Take 325 mg by mouth 3 (three) times daily.   Fish Oil 1000 MG Caps Take 1,000 mg by mouth 2 (two) times daily.   furosemide 40 MG tablet Commonly known as:  LASIX Take 40 mg by mouth daily.   levothyroxine 150 MCG tablet Commonly known as:  SYNTHROID, LEVOTHROID Take 150 mcg by mouth daily before breakfast.   loperamide 2 MG capsule Commonly known as:  IMODIUM Take 4 mg by mouth as needed for diarrhea or loose stools.   metoprolol tartrate 100 MG tablet Commonly known as:  LOPRESSOR Take 150 mg by mouth 2 (two) times daily.   NARCAN 4 MG/0.1ML Liqd nasal spray kit Generic drug:  naloxone Place 0.4 mg into the nose once.   omeprazole 40 MG capsule Commonly known as:  PRILOSEC Take 40 mg by mouth daily.   oxyCODONE 15 mg 12 hr tablet Commonly known as:  OXYCONTIN Take 15 mg by mouth every 12 (twelve) hours.   oxyCODONE-acetaminophen 10-325 MG tablet Commonly known as:  PERCOCET Take 1 tablet by mouth 3 (three) times daily as needed for pain.   PARoxetine 40 MG tablet Commonly known as:  PAXIL Take 60 mg by mouth at bedtime.   prednisoLONE acetate 1 % ophthalmic  suspension Commonly known as:  PRED FORTE Place 1 drop into the right eye 2 (two) times daily.   promethazine 25 MG tablet Commonly known as:  PHENERGAN Take 25 mg by mouth daily as needed for nausea or vomiting.   Vitamin D (Ergocalciferol) 50000 units Caps capsule Commonly known as:  DRISDOL Take 50,000 Units by mouth every Monday.         Outstanding Labs/Studies   None  Duration of Discharge Encounter   Greater than 30 minutes including physician time.  Alcario Drought Freddy Spadafora PA 03/01/2018, 2:55 PM

## 2018-03-01 NOTE — Discharge Instructions (Signed)
Come to Dr. Tyrell Dorsey office next Monday, August 26, to get blood work done. You do not have to be fasting, okay to take all your usual morning medications.

## 2018-03-01 NOTE — Progress Notes (Signed)
Progress Note  Patient Name: [name] Date of Encounter: 03/01/2018  Primary Cardiologist: Dr Stanford Breed   Subjective   No chest pain or dyspnea  Inpatient Medications    Scheduled Meds: . amLODipine  5 mg Oral QHS  . aspirin EC  81 mg Oral QHS  . atorvastatin  80 mg Oral Daily  . [START ON 03/02/2018] calcitRIOL  0.25 mcg Oral Q M,W,F  . clopidogrel  75 mg Oral Q breakfast  . dextrose      . fenofibrate  160 mg Oral Daily  . ferrous sulfate  325 mg Oral TID  . furosemide  40 mg Oral Daily  . insulin glargine  20 Units Subcutaneous BID  . levothyroxine  150 mcg Oral QAC breakfast  . loratadine  10 mg Oral Daily  . metoprolol tartrate  150 mg Oral BID  . omega-3 acid ethyl esters  1 g Oral BID  . oxyCODONE  15 mg Oral Q12H  . pantoprazole  40 mg Oral Daily  . PARoxetine  60 mg Oral QHS  . prednisoLONE acetate  1 drop Right Eye BID  . sodium chloride flush  3 mL Intravenous Q12H  . [START ON 03/05/2018] Vitamin D (Ergocalciferol)  50,000 Units Oral Q Mon   Continuous Infusions: . sodium chloride    . dextrose 5 % and 0.45% NaCl     PRN Meds: sodium chloride, acetaminophen, albuterol, ALPRAZolam, hydrALAZINE, loperamide, ondansetron (ZOFRAN) IV, oxyCODONE-acetaminophen **AND** oxyCODONE, promethazine, sodium chloride flush   Vital Signs    Vitals:   02/28/18 2000 02/28/18 2158 03/01/18 0405 03/01/18 0755  BP: (!) 118/27 (!) 175/31 (!) 155/61 (!) 139/50  Pulse: (!) 51 62 (!) 56 (!) 58  Resp: 17  15 12   Temp: 98.6 F (37 C)  98.4 F (36.9 C) 98.5 F (36.9 C)  TempSrc: Oral  Oral   SpO2: 100%  97% 99%  Weight:   75 kg   Height:        Intake/Output Summary (Last 24 hours) at 03/01/2018 0756 Last data filed at 03/01/2018 0338 Gross per 24 hour  Intake 1235.48 ml  Output 1600 ml  Net -364.52 ml   Filed Weights   02/28/18 0652 03/01/18 0405  Weight: 74.8 kg 75 kg    Telemetry    Sinus- Personally Reviewed  Physical Exam   GEN: No acute distress.     Neck: No JVD Cardiac: RRR Respiratory: Clear to auscultation bilaterally. GI: Soft, nontender, non-distended right groin with bruit noted. MS: No edema Neuro:  Nonfocal  Psych: Normal affect   Labs    Chemistry Recent Labs  Lab 02/27/18 1400 02/28/18 0718 03/01/18 0347  NA 140  --  140  K 5.8* 4.1 4.5  CL 103  --  110  CO2 25  --  26  GLUCOSE 142*  --  198*  BUN 33*  --  31*  CREATININE 2.04*  --  1.92*  CALCIUM 9.0  --  8.2*  GFRNONAA 25*  --  27*  GFRAA 29*  --  31*  ANIONGAP  --   --  4*     Hematology Recent Labs  Lab 02/27/18 1400  WBC 9.3  RBC 4.35  HGB 13.0  HCT 39.4  MCV 91  MCH 29.9  MCHC 33.0  RDW 13.9  PLT 234    Patient Profile     62 y.o. female with past medical history of coronary artery disease status post coronary artery bypass and graft,  stage III chronic kidney disease, peripheral vascular disease, diabetes mellitus, hypertension, hyperlipidemia now status post angioplasty of left femoral-popliteal bypass.  Assessment & Plan    1. Peripheral arterial disease- status post angioplasty of left femoral-popliteal bypass.  Patient doing well with 2+ dorsalis pedis pulse on the left.  However patient has a right groin bruit.  Schedule ultrasound to exclude pseudoaneurysm or AV fistula.  If negative patient can be discharged.   Continue aspirin, Plavix and statin.  2. Coronary artery disease: No chest pain.  Continue medical therapy.  3. Essential hypertension: Mildly elevated.  Will follow as an outpatient and adjust regimen as needed.  4. Hyperlipidemia: Continue present medications.  5. Bilateral carotid stenosis: carotid Doppler showed stable moderate left ICA stenosis.  Repeat study in February 2020.  6 chronic stage III kidney disease-creatinine unchanged this morning.  Plan repeat bmet Monday, August 20 16.  As outlined above await ultrasound of groin.  If no pseudoaneurysm or AV fistula patient can be discharged.  Follow-up with  Dr. Fletcher Anon in 2 weeks.  Follow-up with me in 3 months.  Greater than 30-minute PA and physician time. D2  For questions or updates, please contact [place] Please consult [email or web address] for contact info under Cardiology/STEMI.      Signed, Kirk Ruths, MD  03/01/2018, 7:56 AM

## 2018-03-01 NOTE — Progress Notes (Signed)
Hypoglycemic Event  CBG: 64  Treatment: 15 GM carbohydrate snack  Symptoms: Hungry  Follow-up CBG: Time:2117 CBG Result:60  Possible Reasons for Event: Inadequate meal intake  Comments/MD notified:Dr.Arida    [name], [employer]

## 2018-03-01 NOTE — Progress Notes (Signed)
Hypoglycemic Event  CBG:60  Treatment: D50 IV 25 mL  Symptoms: None  Follow-up CBG: Time:2149 CBG Result:212  Possible Reasons for Event: Unknown  Comments/MD notified:Dr.Arida    [name], [employer]

## 2018-03-01 NOTE — Progress Notes (Signed)
Right Groin evaluation for pseudoaneurysm completed. Preliminary results - There is no evidence of a pseudoaneurysm or A-V fistula.  [facility], [place] 03/01/2018 11:37 AM

## 2018-03-02 ENCOUNTER — Other Ambulatory Visit: Payer: Self-pay | Admitting: *Deleted

## 2018-03-02 DIAGNOSIS — I739 Peripheral vascular disease, unspecified: Secondary | ICD-10-CM

## 2018-03-05 ENCOUNTER — Telehealth: Payer: Self-pay | Admitting: *Deleted

## 2018-03-05 ENCOUNTER — Other Ambulatory Visit: Payer: Self-pay

## 2018-03-05 DIAGNOSIS — I10 Essential (primary) hypertension: Secondary | ICD-10-CM

## 2018-03-05 DIAGNOSIS — E875 Hyperkalemia: Secondary | ICD-10-CM

## 2018-03-05 LAB — BASIC METABOLIC PANEL
BUN / CREAT RATIO: 17 (ref 12–28)
BUN: 34 mg/dL — AB (ref 8–27)
CO2: 24 mmol/L (ref 20–29)
CREATININE: 2.01 mg/dL — AB (ref 0.57–1.00)
Calcium: 8.8 mg/dL (ref 8.7–10.3)
Chloride: 105 mmol/L (ref 96–106)
GFR calc non Af Amer: 26 mL/min/{1.73_m2} — ABNORMAL LOW (ref >59)
GFR, EST AFRICAN AMERICAN: 30 mL/min/{1.73_m2} — AB (ref >59)
Glucose: 197 mg/dL — ABNORMAL HIGH (ref 65–99)
Potassium: 5.9 mmol/L (ref 3.5–5.2)
SODIUM: 139 mmol/L (ref 134–144)

## 2018-03-05 NOTE — Telephone Encounter (Signed)
Advised patient and will have STAT labs when she returns Wednesday for doppler   Notes recorded by Wellington Hampshire, MD on 03/05/2018 at 4:42 PM EDT Inform patient that labs showed stable kidney function. However, her potassium is elevated which could be due to delayed processing. Please provide her with low potassium diet instructions and repeat stat potassium lab in 2 days.

## 2018-03-05 NOTE — Telephone Encounter (Signed)
Received a call from [facility] regarding critical K+ of 5.8. Will forward to Dr Stanford Breed DOD for review

## 2018-03-05 NOTE — Telephone Encounter (Signed)
See result note from dr Fletcher Anon.

## 2018-03-05 NOTE — Telephone Encounter (Signed)
Low K diet, make sure pt not taking K supplement; repeat bmet tomorrow Kirk Ruths

## 2018-03-07 ENCOUNTER — Emergency Department (HOSPITAL_COMMUNITY): Payer: Medicare Other

## 2018-03-07 ENCOUNTER — Ambulatory Visit (HOSPITAL_BASED_OUTPATIENT_CLINIC_OR_DEPARTMENT_OTHER)
Admission: RE | Admit: 2018-03-07 | Discharge: 2018-03-07 | Disposition: A | Discharge status: Home / Self Care | Payer: Medicare Other | Source: Ambulatory Visit | Attending: Internal Medicine | Admitting: Internal Medicine

## 2018-03-07 ENCOUNTER — Other Ambulatory Visit: Payer: Self-pay

## 2018-03-07 ENCOUNTER — Encounter (HOSPITAL_COMMUNITY): Payer: Self-pay | Admitting: Emergency Medicine

## 2018-03-07 ENCOUNTER — Telehealth: Payer: Self-pay | Admitting: *Deleted

## 2018-03-07 ENCOUNTER — Emergency Department (HOSPITAL_COMMUNITY)
Admission: EM | Admit: 2018-03-07 | Discharge: 2018-03-07 | Disposition: A | Discharge status: Home / Self Care | Payer: Medicare Other | Attending: Emergency Medicine | Admitting: Emergency Medicine

## 2018-03-07 DIAGNOSIS — I739 Peripheral vascular disease, unspecified: Secondary | ICD-10-CM

## 2018-03-07 DIAGNOSIS — N183 Chronic kidney disease, stage 3 (moderate): Secondary | ICD-10-CM | POA: Diagnosis not present

## 2018-03-07 DIAGNOSIS — E119 Type 2 diabetes mellitus without complications: Secondary | ICD-10-CM | POA: Insufficient documentation

## 2018-03-07 DIAGNOSIS — Z87891 Personal history of nicotine dependence: Secondary | ICD-10-CM | POA: Insufficient documentation

## 2018-03-07 DIAGNOSIS — E039 Hypothyroidism, unspecified: Secondary | ICD-10-CM | POA: Diagnosis not present

## 2018-03-07 DIAGNOSIS — Z79899 Other long term (current) drug therapy: Secondary | ICD-10-CM | POA: Diagnosis not present

## 2018-03-07 DIAGNOSIS — R531 Weakness: Secondary | ICD-10-CM | POA: Insufficient documentation

## 2018-03-07 DIAGNOSIS — Z7982 Long term (current) use of aspirin: Secondary | ICD-10-CM | POA: Insufficient documentation

## 2018-03-07 DIAGNOSIS — I129 Hypertensive chronic kidney disease with stage 1 through stage 4 chronic kidney disease, or unspecified chronic kidney disease: Secondary | ICD-10-CM | POA: Insufficient documentation

## 2018-03-07 DIAGNOSIS — Z794 Long term (current) use of insulin: Secondary | ICD-10-CM | POA: Diagnosis not present

## 2018-03-07 DIAGNOSIS — J45909 Unspecified asthma, uncomplicated: Secondary | ICD-10-CM | POA: Insufficient documentation

## 2018-03-07 LAB — I-STAT CHEM 8, ED
BUN: 32 mg/dL — ABNORMAL HIGH (ref 8–23)
CALCIUM ION: 1.13 mmol/L — AB (ref 1.15–1.40)
Chloride: 105 mmol/L (ref 98–111)
Creatinine, Ser: 1.7 mg/dL — ABNORMAL HIGH (ref 0.44–1.00)
GLUCOSE: 279 mg/dL — AB (ref 70–99)
HCT: 36 % (ref 36.0–46.0)
HEMOGLOBIN: 12.2 g/dL (ref 12.0–15.0)
POTASSIUM: 4.7 mmol/L (ref 3.5–5.1)
SODIUM: 135 mmol/L (ref 135–145)
TCO2: 22 mmol/L (ref 22–32)

## 2018-03-07 LAB — COMPREHENSIVE METABOLIC PANEL
ALT: 12 U/L (ref 0–44)
ANION GAP: 10 (ref 5–15)
AST: 18 U/L (ref 15–41)
Albumin: 3.1 g/dL — ABNORMAL LOW (ref 3.5–5.0)
Alkaline Phosphatase: 48 U/L (ref 38–126)
BUN: 29 mg/dL — ABNORMAL HIGH (ref 8–23)
CHLORIDE: 105 mmol/L (ref 98–111)
CO2: 21 mmol/L — ABNORMAL LOW (ref 22–32)
Calcium: 8.6 mg/dL — ABNORMAL LOW (ref 8.9–10.3)
Creatinine, Ser: 1.71 mg/dL — ABNORMAL HIGH (ref 0.44–1.00)
GFR, EST AFRICAN AMERICAN: 36 mL/min — AB (ref >60)
GFR, EST NON AFRICAN AMERICAN: 31 mL/min — AB (ref >60)
Glucose, Bld: 288 mg/dL — ABNORMAL HIGH (ref 70–99)
Potassium: 4.6 mmol/L (ref 3.5–5.1)
Sodium: 136 mmol/L (ref 135–145)
Total Bilirubin: 0.9 mg/dL (ref 0.3–1.2)
Total Protein: 6 g/dL — ABNORMAL LOW (ref 6.5–8.1)

## 2018-03-07 LAB — APTT: APTT: 28 s (ref 24–36)

## 2018-03-07 LAB — DIFFERENTIAL
ABS IMMATURE GRANULOCYTES: 0 10*3/uL (ref 0.0–0.1)
BASOS PCT: 1 %
Basophils Absolute: 0 10*3/uL (ref 0.0–0.1)
EOS ABS: 0.2 10*3/uL (ref 0.0–0.7)
Eosinophils Relative: 2 %
Immature Granulocytes: 0 %
Lymphocytes Relative: 24 %
Lymphs Abs: 1.8 10*3/uL (ref 0.7–4.0)
MONO ABS: 0.6 10*3/uL (ref 0.1–1.0)
MONOS PCT: 8 %
NEUTROS PCT: 65 %
Neutro Abs: 4.7 10*3/uL (ref 1.7–7.7)

## 2018-03-07 LAB — CBC
HEMATOCRIT: 36.6 % (ref 36.0–46.0)
Hemoglobin: 11.9 g/dL — ABNORMAL LOW (ref 12.0–15.0)
MCH: 30.1 pg (ref 26.0–34.0)
MCHC: 32.5 g/dL (ref 30.0–36.0)
MCV: 92.4 fL (ref 78.0–100.0)
Platelets: 182 10*3/uL (ref 150–400)
RBC: 3.96 MIL/uL (ref 3.87–5.11)
RDW: 13.3 % (ref 11.5–15.5)
WBC: 7.3 10*3/uL (ref 4.0–10.5)

## 2018-03-07 LAB — I-STAT TROPONIN, ED
TROPONIN I, POC: 0.03 ng/mL (ref 0.00–0.08)
Troponin i, poc: 0.02 ng/mL (ref 0.00–0.08)

## 2018-03-07 LAB — PROTIME-INR
INR: 0.99
PROTHROMBIN TIME: 13 s (ref 11.4–15.2)

## 2018-03-07 NOTE — ED Provider Notes (Signed)
Patient placed in Quick Look pathway, seen and evaluated   Chief Complaint: abnormal lab  HPI:   Pt reports being sent by her doctor for elevated potassium(6.2) and that she has been having right arm weakness for approx 2-3 days. Denies chest pain, SOB, and n/v/d.  ROS: Neuro: right arm weakness  Physical Exam:  BP (!) 167/60 (BP Location: Right Arm)   Pulse (!) 47   Temp 97.9 F (36.6 C) (Oral)   Resp 16   Ht 5\' 5"  (1.651 m)   Wt 74.8 kg   SpO2 100%   BMI 27.46 kg/m    Gen: No distress  Neuro: Awake and Alert, no facial weakness, slight decreased strength right upper extremity, mild pronator drift right.  Skin: Warm and dry     Initiation of care has begun. The patient has been counseled on the process, plan, and necessity for staying for the completion/evaluation, and the remainder of the medical screening examination    Ashley Murrain, NP 03/07/18 Garey Ham, MD 03/08/18 [phone]

## 2018-03-07 NOTE — Discharge Instructions (Signed)
Follow up with your Physician for recheck as scheduled.  Your potassium is normal. Your head Ct is normal

## 2018-03-07 NOTE — Telephone Encounter (Signed)
OPEN IN ERROR 

## 2018-03-07 NOTE — ED Provider Notes (Signed)
[facility] EMERGENCY DEPARTMENT Provider Note   CSN: [id number] Arrival date & time: 03/07/18  1827     History   Chief Complaint Chief Complaint  Patient presents with  . Abnormal Lab  . Weakness    HPI [name] is a 62 y.o. female.  The history is provided by the patient. No language interpreter was used.  Abnormal Lab  Time since result:  Today Patient referred by:  PCP Resulting agency:  External Result type: chemistry   Chemistry:    Potassium:  High Weakness   Pt was sent here by cardiology because her potassium was 6.2.  Pt reports she has been weaker than usual.  Pt reports she has had some right arm weakness for the past 2 weeks.    Past Medical History:  Diagnosis Date  . Adenomatous polyp of colon   . Anemia   . Arthritis   . Asthma   . Carotid artery disease ([place])   . Carpal tunnel syndrome   . Chronic renal insufficiency    Stage 3 Kidney disease  . Coronary artery disease   . Depression   . Depression with anxiety   . Diabetes mellitus    type II  . Diverticulosis of colon (without mention of hemorrhage)   . GERD (gastroesophageal reflux disease)   . Heart murmur   . History of hiatal hernia   . Hyperlipidemia   . Hypertension   . Hypothyroidism   . Obesity   . PVD (peripheral vascular disease) ([place])   . Thyroid disease    hypo    Patient Active Problem List   Diagnosis Date Noted  . Abnormal CT scan, esophagus   . Hematochezia   . GIB (gastrointestinal bleeding) 08/01/2017  . Pressure injury of skin 11/25/2016  . PAD (peripheral artery disease) ([place]) 11/23/2016  . Major depressive disorder, recurrent episode, moderate ([place]) 04/04/2016  . Disorder resulting from impaired renal function 10/27/2009  . Bilateral carotid artery stenosis 10/08/2009  . Essential hypertension 10/02/2008  . GERD 03/11/2008  . COLONIC POLYPS 03/10/2008  . DM2 (diabetes mellitus, type 2) ([place]) 03/10/2008  . Hyperlipidemia 03/10/2008   . ANXIETY DEPRESSION 03/10/2008  . Coronary atherosclerosis 03/10/2008  . Peripheral vascular disease ([place]) 03/10/2008  . Diverticulosis of colon 03/10/2008    Past Surgical History:  Procedure Laterality Date  . ABDOMINAL AORTAGRAM N/A 12/18/2013   Procedure: ABDOMINAL [name];  Surgeon: Wellington Hampshire, MD;  Location: [facility] CATH LAB;  Service: Cardiovascular;  Laterality: N/A;  . ABDOMINAL AORTOGRAM W/LOWER EXTREMITY N/A 11/16/2016   Procedure: Abdominal Aortogram w/Lower Extremity;  Surgeon: Wellington Hampshire, MD;  Location: [facility];  Service: Cardiovascular;  Laterality: N/A;  . ABDOMINAL AORTOGRAM W/LOWER EXTREMITY N/A 01/25/2017   Procedure: Abdominal Aortogram w/Lower Extremity;  Surgeon: Wellington Hampshire, MD;  Location: [facility];  Service: Cardiovascular;  Laterality: N/A;  only completed Lower Extremity  . ABDOMINAL AORTOGRAM W/LOWER EXTREMITY N/A 02/28/2018   Procedure: ABDOMINAL AORTOGRAM W/LOWER EXTREMITY;  Surgeon: Wellington Hampshire, MD;  Location: [facility];  Service: Cardiovascular;  Laterality: N/A;  . ABDOMINAL HYSTERECTOMY    . APPENDECTOMY    . APPLICATION OF WOUND VAC Right 02/13/2017   Procedure: APPLICATION OF INCISIONAL WOUND VAC RIGHT GROIN;  Surgeon: Elam Dutch, MD;  Location: [facility];  Service: Vascular;  Laterality: Right;  . CHOLECYSTECTOMY    . CORONARY ARTERY BYPASS GRAFT  2004   x4  . ENDARTERECTOMY  FEMORAL Left 11/23/2016   Procedure: ENDARTERECTOMY OF LEFT EXTERNAL COMMON FEMORAL ARTERY WITH EXTENDED LEFT PROFUNDOPLASTY;  Surgeon: Elam Dutch, MD;  Location: [facility];  Service: Vascular;  Laterality: Left;  . ENDARTERECTOMY FEMORAL Right 02/13/2017   Procedure: ENDARTERECTOMY RIGHT FEMORAL ARTERY WITH PROFUNDAPLASTY;  Surgeon: Elam Dutch, MD;  Location: [facility] OR;  Service: Vascular;  Laterality: Right;  . ESOPHAGOGASTRODUODENOSCOPY N/A 08/02/2017   Procedure: ESOPHAGOGASTRODUODENOSCOPY (EGD);  Surgeon: Ladene Artist,  MD;  Location: [facility] ENDOSCOPY;  Service: Endoscopy;  Laterality: N/A;  . FEMORAL-POPLITEAL BYPASS GRAFT Left 11/23/2016   Procedure: LEFT FEMORAL-BELOW KNEE POPLITEAL ARTERY BYPASS;  Surgeon: Elam Dutch, MD;  Location: [facility];  Service: Vascular;  Laterality: Left;  . FEMORAL-POPLITEAL BYPASS GRAFT Right 02/13/2017   Procedure: BYPASS GRAFT RIGHT FEMORAL-BELOW KNEE POPLITEAL ARTERY;  Surgeon: Elam Dutch, MD;  Location: [facility];  Service: Vascular;  Laterality: Right;  . KNEE SURGERY Right 2013   arthroscopy  . PATCH ANGIOPLASTY Right 02/13/2017   Procedure: VEIN PATCH ANGIOPLASTY RIGHT POPLITEAL ARTERY AND HEMASHIELD PATCH ANGIOPLASTY OF RIGHT FEMORAL ARTERY;  Surgeon: Elam Dutch, MD;  Location: [facility];  Service: Vascular;  Laterality: Right;  . PERIPHERAL VASCULAR BALLOON ANGIOPLASTY  02/28/2018   Procedure: PERIPHERAL VASCULAR BALLOON ANGIOPLASTY;  Surgeon: Wellington Hampshire, MD;  Location: [facility] CV LAB;  Service: Cardiovascular;;  Left Fem-pop Bypass     OB History   None      Home Medications    Prior to Admission medications   Medication Sig Start Date End Date Taking? Authorizing Provider  albuterol (PROVENTIL HFA;VENTOLIN HFA) 108 (90 BASE) MCG/ACT inhaler Inhale 2 puffs into the lungs every 6 (six) hours as needed for wheezing or shortness of breath.     [provider]  ALPRAZolam Duanne Moron) 1 MG tablet Take 1 mg by mouth 2 (two) times daily as needed for anxiety.  05/11/11   [provider]  amLODipine (NORVASC) 5 MG tablet Take 1 tablet (5 mg total) by mouth at bedtime. 06/27/17   Wellington Hampshire, MD  aspirin EC 81 MG tablet Take 81 mg by mouth at bedtime.    [provider]  atorvastatin (LIPITOR) 80 MG tablet Take 80 mg by mouth daily.     [provider]  calcitRIOL (ROCALTROL) 0.25 MCG capsule Take 0.25 mcg by mouth every Monday, Wednesday, and Friday.  07/02/17   [provider]  cetirizine (ZYRTEC) 10 MG tablet  Take 10 mg by mouth daily. 06/30/17   [provider]  clopidogrel (PLAVIX) 75 MG tablet Take 1 tablet (75 mg total) by mouth daily with breakfast. 03/02/18   Barrett, Evelene Croon, PA-C  fenofibrate micronized (LOFIBRA) 200 MG capsule Take 200 mg by mouth at bedtime.     [provider]  ferrous sulfate 325 (65 FE) MG tablet Take 325 mg by mouth 3 (three) times daily. 05/10/15   [provider]  furosemide (LASIX) 40 MG tablet Take 40 mg by mouth daily.  05/08/11   [provider]  Insulin Glargine (BASAGLAR KWIKPEN) 100 UNIT/ML SOPN Inject 20-60 Units into the skin 2 (two) times daily before a meal. Per sliding scale 01/26/17   [provider]  levothyroxine (SYNTHROID, LEVOTHROID) 150 MCG tablet Take 150 mcg by mouth daily before breakfast.    [provider]  loperamide (IMODIUM) 2 MG capsule Take 4 mg by mouth as needed for diarrhea or loose stools.    [provider]  metoprolol  tartrate (LOPRESSOR) 100 MG tablet Take 150 mg by mouth 2 (two) times daily.  11/13/16   [provider]  NARCAN 4 MG/0.1ML LIQD nasal spray kit Place 0.4 mg into the nose once.  01/27/17   [provider]  Omega-3 Fatty Acids (FISH OIL) 1000 MG CAPS Take 1,000 mg by mouth 2 (two) times daily.    [provider]  omeprazole (PRILOSEC) 40 MG capsule Take 40 mg by mouth daily.    [provider]  oxyCODONE (OXYCONTIN) 15 mg 12 hr tablet Take 15 mg by mouth every 12 (twelve) hours.    [provider]  oxyCODONE-acetaminophen (PERCOCET) 10-325 MG tablet Take 1 tablet by mouth 3 (three) times daily as needed for pain.  12/22/17   [provider]  PARoxetine (PAXIL) 40 MG tablet Take 60 mg by mouth at bedtime.     [provider]  prednisoLONE acetate (PRED FORTE) 1 % ophthalmic suspension Place 1 drop into the right eye 2 (two) times daily.    [provider]  promethazine (PHENERGAN) 25 MG tablet Take  25 mg by mouth daily as needed for nausea or vomiting.  08/06/16   [provider]  Vitamin D, Ergocalciferol, (DRISDOL) 50000 UNITS CAPS capsule Take 50,000 Units by mouth every Monday.     [provider]    Family History Family History  Problem Relation Age of Onset  . Diabetes Mother   . Heart disease Father   . Colon polyps Daughter   . Colon cancer Neg Hx   . Esophageal cancer Neg Hx   . Stomach cancer Neg Hx   . Rectal cancer Neg Hx     Social History Social History   Tobacco Use  . Smoking status: Former Smoker    Packs/day: 1.00    Years: 30.00    Pack years: 30.00    Types: Cigarettes    Last attempt to quit: 2018    Years since quitting: 1.6  . Smokeless tobacco: Never Used  Substance Use Topics  . Alcohol use: No    Alcohol/week: 0.0 standard drinks  . Drug use: No     Allergies   Patient has no known allergies.   Review of Systems Review of Systems  Neurological: Positive for weakness.  All other systems reviewed and are negative.    Physical Exam Updated Vital Signs BP (!) 181/61   Pulse (!) 52   Temp 97.9 F (36.6 C) (Oral)   Resp 15   Ht '5\' 5"'  (1.651 m)   Wt 74.8 kg   SpO2 97%   BMI 27.46 kg/m   Physical Exam  Constitutional: She appears well-developed and well-nourished.  HENT:  Head: Normocephalic.  Eyes: Pupils are equal, round, and reactive to light. Conjunctivae and EOM are normal.  Neck: Normal range of motion.  Cardiovascular: Normal rate.  Pulmonary/Chest: Effort normal.  Abdominal: Soft.  Musculoskeletal: Normal range of motion.  Neurological: She is alert.  Skin: Skin is warm.  Psychiatric: She has a normal mood and affect.  Nursing note and vitals reviewed.    ED Treatments / Results  Labs (all labs ordered are listed, but only abnormal results are displayed) Labs Reviewed  CBC - Abnormal; Notable for the following components:      Result Value   Hemoglobin 11.9 (*)    All other components  within normal limits  COMPREHENSIVE METABOLIC PANEL - Abnormal; Notable for the following components:   CO2 21 (*)  Glucose, Bld 288 (*)    BUN 29 (*)    Creatinine, Ser 1.71 (*)    Calcium 8.6 (*)    Total Protein 6.0 (*)    Albumin 3.1 (*)    GFR calc non Af Amer 31 (*)    GFR calc Af Amer 36 (*)    All other components within normal limits  I-STAT CHEM 8, ED - Abnormal; Notable for the following components:   BUN 32 (*)    Creatinine, Ser 1.70 (*)    Glucose, Bld 279 (*)    Calcium, Ion 1.13 (*)    All other components within normal limits  PROTIME-INR  APTT  DIFFERENTIAL  URINALYSIS, ROUTINE W REFLEX MICROSCOPIC  I-STAT TROPONIN, ED  CBG MONITORING, ED  I-STAT TROPONIN, ED    EKG EKG Interpretation  Date/Time:  Wednesday March 07 2018 18:49:16 EDT Ventricular Rate:  51 PR Interval:  180 QRS Duration: 84 QT Interval:  478 QTC Calculation: 440 R Axis:   33 Text Interpretation:  Sinus bradycardia ST & T wave abnormality, consider lateral ischemia Abnormal ECG Baseline wander Confirmed by Quintella Reichert [phone]) on 03/07/2018 10:10:25 PM   Radiology Ct Head Wo Contrast  Result Date: 03/07/2018 CLINICAL DATA:  Hyperkalemia. RIGHT arm weakness for 3 days. History of hypertension, hyperlipidemia, diabetes. EXAM: CT HEAD WITHOUT CONTRAST TECHNIQUE: Contiguous axial images were obtained from the base of the skull through the vertex without intravenous contrast. COMPARISON:  None. FINDINGS: BRAIN: No intraparenchymal hemorrhage, mass effect nor midline shift. The ventricles and sulci are normal for age. Faint supratentorial white matter hypodensities within normal range for patient's age, though non-specific are most compatible with chronic small vessel ischemic disease. No acute large vascular territory infarcts. No abnormal extra-axial fluid collections. Potential partially empty sella. Basal cisterns are patent. VASCULAR: Severe calcific atherosclerosis of the carotid  siphons and vertebral arteries. SKULL: No skull fracture. No significant scalp soft tissue swelling. SINUSES/ORBITS: Trace paranasal sinus mucosal thickening. Small LEFT sphenoid sinus air-fluid level. Mastoid air cells are well aerated. Soft tissue RIGHT external auditory canal most compatible with cerumen. The included ocular globes and orbital contents are non-suspicious. Status post RIGHT ocular lens implant. OTHER: None. IMPRESSION: 1. No acute intracranial process. 2. Severe atherosclerosis. 3. Otherwise negative non-contrast CT HEAD for age. Electronically Signed   By: Elon Alas M.D.   On: 03/07/2018 19:41    Procedures Procedures (including critical care time)  Medications Ordered in ED Medications - No data to display   Initial Impression / Assessment and Plan / ED Course  I have reviewed the triage vital signs and the nursing notes.  Pertinent labs & imaging results that were available during my care of the patient were reviewed by me and considered in my medical decision making (see chart for details).       Final Clinical Impressions(s) / ED Diagnoses   Final diagnoses:  Weakness    ED Discharge Orders    None    An After Visit Summary was printed and given to the patient.   Sidney Ace 03/07/18 [id number]    Quintella Reichert, MD 03/08/18 [phone]

## 2018-03-07 NOTE — ED Triage Notes (Signed)
Pt reports being sent by her doctor for elevated potassium(6.2) and that she has been having right arm weakness for approx 2-3 days. Denies chest pain, SOB, and n/v/d.

## 2018-03-08 ENCOUNTER — Telehealth: Payer: Self-pay | Admitting: *Deleted

## 2018-03-08 LAB — BASIC METABOLIC PANEL
BUN / CREAT RATIO: 18 (ref 12–28)
BUN: 31 mg/dL — AB (ref 8–27)
CALCIUM: 8.7 mg/dL (ref 8.7–10.3)
CO2: 20 mmol/L (ref 20–29)
Chloride: 105 mmol/L (ref 96–106)
Creatinine, Ser: 1.76 mg/dL — ABNORMAL HIGH (ref 0.57–1.00)
GFR, EST AFRICAN AMERICAN: 35 mL/min/{1.73_m2} — AB (ref >59)
GFR, EST NON AFRICAN AMERICAN: 30 mL/min/{1.73_m2} — AB (ref >59)
Glucose: 410 mg/dL — ABNORMAL HIGH (ref 65–99)
Potassium: 6.2 mmol/L (ref 3.5–5.2)
Sodium: 140 mmol/L (ref 134–144)

## 2018-03-08 NOTE — Telephone Encounter (Signed)
[facility] CALLED TO INFORM OFFICE OF  ELEVATED K+ LEVEL FROM 03/07/18.    THE MATTER TAKEN CARE OF ON 03/07/18-SEE LAB RESULTS , TELEPHONE CALLS.

## 2018-03-09 ENCOUNTER — Telehealth: Payer: Self-pay | Admitting: *Deleted

## 2018-03-09 DIAGNOSIS — I739 Peripheral vascular disease, unspecified: Secondary | ICD-10-CM

## 2018-03-09 NOTE — Telephone Encounter (Signed)
Patient made aware of results and verbalized understanding. Repeat orders placed.    

## 2018-03-09 NOTE — Telephone Encounter (Signed)
-----   Message from Wellington Hampshire, MD sent at 03/09/2018  1:39 PM EDT ----- Inform patient that ABI improved close to normal on the left side.  Repeat same studies in 6 months.

## 2018-03-15 ENCOUNTER — Telehealth: Payer: Self-pay

## 2018-03-15 MED ORDER — FAMOTIDINE 40 MG PO TABS: 40.0000 mg | ORAL_TABLET | Freq: Every day | ORAL | 1 refills | Status: DC

## 2018-03-15 NOTE — Telephone Encounter (Signed)
-----   Message from Lonn Georgia, PA-C sent at 03/09/2018  5:30 PM EDT ----- 40 mg of either Thx ----- Message ----- From: Harold Hedge, CMA Sent: 03/09/2018   4:26 PM EDT To: Lonn Georgia, PA-C  What strength of Pepcid or Nexium?  ----- Message ----- From: Reola Mosher Sent: 03/09/2018   2:30 PM EDT To: Lonn Georgia, PA-C, Harold Hedge, CMA  Pt of Dr Fletcher Anon I discharged her on Plavix for 30 days and omeprazole. Can you ask her to change to Pepcid or Nexium while on Plavix? Thanks

## 2018-03-15 NOTE — Telephone Encounter (Signed)
Spoke with patient informed patient that we were going change the Omeprazole to Pepcid or Nexium.  I informed patient that I would send a rx for Pepcid, but informed patient to contact office if Pepcid is too expensive and that we would change it to Nexium. Patient voiced understanding.

## 2018-03-24 ENCOUNTER — Other Ambulatory Visit: Payer: Self-pay | Admitting: Physician Assistant

## 2018-04-03 ENCOUNTER — Ambulatory Visit (INDEPENDENT_AMBULATORY_CARE_PROVIDER_SITE_OTHER): Payer: Medicare Other | Admitting: Cardiovascular Disease

## 2018-04-03 ENCOUNTER — Encounter: Payer: Self-pay | Admitting: Cardiovascular Disease

## 2018-04-03 VITALS — BP 138/62 | HR 98 | Wt 170.0 lb

## 2018-04-03 DIAGNOSIS — I779 Disorder of arteries and arterioles, unspecified: Secondary | ICD-10-CM | POA: Diagnosis not present

## 2018-04-03 DIAGNOSIS — I2510 Atherosclerotic heart disease of native coronary artery without angina pectoris: Secondary | ICD-10-CM | POA: Diagnosis not present

## 2018-04-03 DIAGNOSIS — I739 Peripheral vascular disease, unspecified: Secondary | ICD-10-CM | POA: Diagnosis not present

## 2018-04-03 DIAGNOSIS — E7800 Pure hypercholesterolemia, unspecified: Secondary | ICD-10-CM | POA: Diagnosis not present

## 2018-04-03 DIAGNOSIS — I6523 Occlusion and stenosis of bilateral carotid arteries: Secondary | ICD-10-CM

## 2018-04-03 DIAGNOSIS — I10 Essential (primary) hypertension: Secondary | ICD-10-CM

## 2018-04-03 NOTE — Patient Instructions (Signed)
Medication Instructions:  Your physician recommends that you continue on your current medications as directed. Please refer to the Current Medication list given to you today.   Labwork: none  Testing/Procedures: Your physician has requested that you have a carotid duplex. This test is an ultrasound of the carotid arteries in your neck. It looks at blood flow through these arteries that supply the brain with blood. Allow one hour for this exam. There are no restrictions or special instructions.  Your physician has requested that you have a lower extremity arterial doppler- During this test, ultrasound is used to evaluate arterial blood flow in the legs. Allow approximately one hour for this exam.  Your physician has requested that you have an ankle brachial index (ABI). During this test an ultrasound and blood pressure cuff are used to evaluate the arteries that supply the arms and legs with blood. Allow thirty minutes for this exam. There are no restrictions or special instructions.    Follow-Up: Your physician wants you to follow-up in: 6 months with [facility] - at least week after testing. You will receive a reminder letter in the mail two months in advance. If you don't receive a letter, please call our office to schedule the follow-up appointment.   Any Other Special Instructions Will Be Listed Below (If Applicable).     If you need a refill on your cardiac medications before your next appointment, please call your pharmacy.

## 2018-04-03 NOTE — Progress Notes (Signed)
Cardiology Office Note   Date:  04/03/2018   ID:  [name], DOB 1955-07-12, MRN [id number]  PCP:  Nicholes Rough, PA-C  Cardiologist:   Dr. Stanford Breed  No chief complaint on file.     History of Present Illness: [name] is a 62 y.o. female who presents for a followup visit regarding peripheral arterial disease. She has known history of  coronary artery status post coronary bypass graft surgery in 2004. She is known to have moderate bilateral carotid stenosis.  She also has prolonged history of diabetes with  chronic kidney disease and  diabetic neuropathy.  She is s/p left external iliac/common femoral and profunda endarterectomy with extended profundoplasty, ligation of the left superficial femoral artery and femoral to below the knee popliteal artery bypass with a vein graft in 05/18 and more recently right femoral to below-knee popliteal artery bypass in August, 2018.  Revascularization was performed for critical limb ischemia. She had GI bleed on small dose Xarelto which was started for peripheral arterial disease.  EGD was unremarkable.  She was taken off Xarelto.   She had a recent drop in ABI to 0.6 on the left with severe stenosis in the mid to distal segment of the femoropopliteal bypass. I proceeded with CO2 angiography in August which showed patent left femoral to below the knee popliteal artery bypass with discrete 80% stenosis in the mid to distal segment.  I performed successful drug-coated balloon angioplasty.  The patient was hydrated and there was no worsening of chronic kidney disease. She has been doing well with no claudication.  Postprocedure ABI improved close to normal in the left with significant improvement in the velocity in the graft. No chest pain or shortness of breath.  Past Medical History:  Diagnosis Date  . Adenomatous polyp of colon   . Anemia   . Arthritis   . Asthma   . Carotid artery disease ([place])   . Carpal tunnel syndrome   . Chronic  renal insufficiency    Stage 3 Kidney disease  . Coronary artery disease   . Depression   . Depression with anxiety   . Diabetes mellitus    type II  . Diverticulosis of colon (without mention of hemorrhage)   . GERD (gastroesophageal reflux disease)   . Heart murmur   . History of hiatal hernia   . Hyperlipidemia   . Hypertension   . Hypothyroidism   . Obesity   . PVD (peripheral vascular disease) ([place])   . Thyroid disease    hypo    Past Surgical History:  Procedure Laterality Date  . ABDOMINAL AORTAGRAM N/A 12/18/2013   Procedure: ABDOMINAL [name];  Surgeon: Wellington Hampshire, MD;  Location: [place] CATH LAB;  Service: Cardiovascular;  Laterality: N/A;  . ABDOMINAL AORTOGRAM W/LOWER EXTREMITY N/A 11/16/2016   Procedure: Abdominal Aortogram w/Lower Extremity;  Surgeon: Wellington Hampshire, MD;  Location: [place] CV LAB;  Service: Cardiovascular;  Laterality: N/A;  . ABDOMINAL AORTOGRAM W/LOWER EXTREMITY N/A 01/25/2017   Procedure: Abdominal Aortogram w/Lower Extremity;  Surgeon: Wellington Hampshire, MD;  Location: [place] CV LAB;  Service: Cardiovascular;  Laterality: N/A;  only completed Lower Extremity  . ABDOMINAL AORTOGRAM W/LOWER EXTREMITY N/A 02/28/2018   Procedure: ABDOMINAL AORTOGRAM W/LOWER EXTREMITY;  Surgeon: Wellington Hampshire, MD;  Location: [place] CV LAB;  Service: Cardiovascular;  Laterality: N/A;  . ABDOMINAL HYSTERECTOMY    . APPENDECTOMY    . APPLICATION OF WOUND VAC Right 02/13/2017  Procedure: APPLICATION OF INCISIONAL WOUND VAC RIGHT GROIN;  Surgeon: Elam Dutch, MD;  Location: [place];  Service: Vascular;  Laterality: Right;  . CHOLECYSTECTOMY    . CORONARY ARTERY BYPASS GRAFT  2004   x4  . ENDARTERECTOMY FEMORAL Left 11/23/2016   Procedure: ENDARTERECTOMY OF LEFT EXTERNAL COMMON FEMORAL ARTERY WITH EXTENDED LEFT PROFUNDOPLASTY;  Surgeon: Elam Dutch, MD;  Location: [place];  Service: Vascular;  Laterality: Left;  . ENDARTERECTOMY FEMORAL Right  02/13/2017   Procedure: ENDARTERECTOMY RIGHT FEMORAL ARTERY WITH PROFUNDAPLASTY;  Surgeon: Elam Dutch, MD;  Location: [facility] OR;  Service: Vascular;  Laterality: Right;  . ESOPHAGOGASTRODUODENOSCOPY N/A 08/02/2017   Procedure: ESOPHAGOGASTRODUODENOSCOPY (EGD);  Surgeon: Ladene Artist, MD;  Location: [facility] ENDOSCOPY;  Service: Endoscopy;  Laterality: N/A;  . FEMORAL-POPLITEAL BYPASS GRAFT Left 11/23/2016   Procedure: LEFT FEMORAL-BELOW KNEE POPLITEAL ARTERY BYPASS;  Surgeon: Elam Dutch, MD;  Location: [place];  Service: Vascular;  Laterality: Left;  . FEMORAL-POPLITEAL BYPASS GRAFT Right 02/13/2017   Procedure: BYPASS GRAFT RIGHT FEMORAL-BELOW KNEE POPLITEAL ARTERY;  Surgeon: Elam Dutch, MD;  Location: [place];  Service: Vascular;  Laterality: Right;  . KNEE SURGERY Right 2013   arthroscopy  . PATCH ANGIOPLASTY Right 02/13/2017   Procedure: VEIN PATCH ANGIOPLASTY RIGHT POPLITEAL ARTERY AND HEMASHIELD PATCH ANGIOPLASTY OF RIGHT FEMORAL ARTERY;  Surgeon: Elam Dutch, MD;  Location: [place];  Service: Vascular;  Laterality: Right;  . PERIPHERAL VASCULAR BALLOON ANGIOPLASTY  02/28/2018   Procedure: PERIPHERAL VASCULAR BALLOON ANGIOPLASTY;  Surgeon: Wellington Hampshire, MD;  Location: [place] CV LAB;  Service: Cardiovascular;;  Left Fem-pop Bypass     Current Outpatient Medications  Medication Sig Dispense Refill  . albuterol (PROVENTIL HFA;VENTOLIN HFA) 108 (90 BASE) MCG/ACT inhaler Inhale 2 puffs into the lungs every 6 (six) hours as needed for wheezing or shortness of breath.     . ALPRAZolam (XANAX) 1 MG tablet Take 1 mg by mouth 2 (two) times daily as needed for anxiety.     Marland Kitchen amLODipine (NORVASC) 5 MG tablet Take 1 tablet (5 mg total) by mouth at bedtime. 90 tablet 3  . aspirin EC 81 MG tablet Take 81 mg by mouth at bedtime.    Marland Kitchen atorvastatin (LIPITOR) 80 MG tablet Take 80 mg by mouth daily.     . calcitRIOL (ROCALTROL) 0.25 MCG capsule Take 0.25 mcg by mouth every Monday,  Wednesday, and Friday.   6  . cetirizine (ZYRTEC) 10 MG tablet Take 10 mg by mouth daily.  6  . clopidogrel (PLAVIX) 75 MG tablet Take 1 tablet (75 mg total) by mouth daily with breakfast. Please schedule appointment for refills 30 tablet 0  . famotidine (PEPCID) 40 MG tablet Take 1 tablet (40 mg total) by mouth daily. 90 tablet 1  . fenofibrate micronized (LOFIBRA) 200 MG capsule Take 200 mg by mouth at bedtime.     . ferrous sulfate 325 (65 FE) MG tablet Take 325 mg by mouth 3 (three) times daily.  3  . furosemide (LASIX) 40 MG tablet Take 40 mg by mouth daily.     . Insulin Glargine (BASAGLAR KWIKPEN) 100 UNIT/ML SOPN Inject 20-60 Units into the skin 2 (two) times daily before a meal. Per sliding scale  3  . levothyroxine (SYNTHROID, LEVOTHROID) 150 MCG tablet Take 150 mcg by mouth daily before breakfast.    . loperamide (IMODIUM) 2 MG capsule Take 4 mg by mouth as needed for diarrhea or loose stools.    Marland Kitchen  metoprolol tartrate (LOPRESSOR) 100 MG tablet Take 150 mg by mouth 2 (two) times daily.   6  . NARCAN 4 MG/0.1ML LIQD nasal spray kit Place 0.4 mg into the nose once.   0  . Omega-3 Fatty Acids (FISH OIL) 1000 MG CAPS Take 1,000 mg by mouth 2 (two) times daily.    Marland Kitchen oxyCODONE (OXYCONTIN) 15 mg 12 hr tablet Take 15 mg by mouth every 12 (twelve) hours.    Marland Kitchen oxyCODONE-acetaminophen (PERCOCET) 10-325 MG tablet Take 1 tablet by mouth 3 (three) times daily as needed for pain.   0  . PARoxetine (PAXIL) 40 MG tablet Take 60 mg by mouth at bedtime.     . prednisoLONE acetate (PRED FORTE) 1 % ophthalmic suspension Place 1 drop into the right eye 2 (two) times daily.    . promethazine (PHENERGAN) 25 MG tablet Take 25 mg by mouth daily as needed for nausea or vomiting.   6  . Vitamin D, Ergocalciferol, (DRISDOL) 50000 UNITS CAPS capsule Take 50,000 Units by mouth 2 (two) times a week. Takes on Mondays and Fridays  1   No current facility-administered medications for this visit.     Allergies:    Patient has no known allergies.    Social History:  The patient  reports that she quit smoking about 20 months ago. Her smoking use included cigarettes. She has a 30.00 pack-year smoking history. She has never used smokeless tobacco. She reports that she does not drink alcohol or use drugs.   Family History:  The patient's family history includes Colon polyps in her daughter; Diabetes in her mother; Heart disease in her father.    ROS:  Please see the history of present illness.   Otherwise, review of systems are positive for none.   All other systems are reviewed and negative.    PHYSICAL EXAM: VS:  BP 138/62   Pulse 98   Wt 170 lb (77.1 kg)   SpO2 98%   BMI 28.29 kg/m  , BMI Body mass index is 28.29 kg/m. GEN: Well nourished, well developed, in no acute distress  HEENT: normal  Neck: no JVD,  or masses. Bilateral carotid bruits. Cardiac: RRR; no rubs, or gallops,no edema . 2/6 systolic ejection murmur in the aortic area. Respiratory:  clear to auscultation bilaterally, normal work of breathing GI: soft, nontender, nondistended, + BS MS: no deformity or atrophy  Skin: warm and dry, no rash Neuro:  Strength and sensation are intact Psych: euthymic mood, full affect Vascular: Surgical scar is noted in both groins.  Femoral pulses normal bilaterally.  Distal pulses are faint but palpable bilaterally  EKG:  EKG is not ordered today.     Recent Labs: 03/07/2018: ALT 12; BUN 32; Creatinine, Ser 1.70; Hemoglobin 12.2; Platelets 182; Potassium 4.7; Sodium 135    Lipid Panel No results found for: CHOL, TRIG, HDL, CHOLHDL, VLDL, LDLCALC, LDLDIRECT    Wt Readings from Last 3 Encounters:  04/03/18 170 lb (77.1 kg)  03/07/18 165 lb (74.8 kg)  03/01/18 165 lb 5.5 oz (75 kg)         ASSESSMENT AND PLAN:  1. Peripheral arterial disease status post successful surgical revascularization of bilateral lower extremities.   Status post recent drug-coated balloon angioplasty to the  left femoral-popliteal bypass with good results.  The patient took Plavix for 1 month and then it was discontinued given her prior GI bleed.  Continue aggressive treatment of risk factors.  Repeat vascular studies in February.  2. Coronary artery disease: She has no chest pain. Stable exertional dyspnea.  3. Essential hypertension: Blood pressure is reasonably controlled on current medications.  4. Hyperlipidemia: Currently on atorvastatin and fenofibrate.  Recent lipid profile showed significant improvement in triglycerides from 1000 to 217.  LDL was 65.  5. Bilateral carotid stenosis: carotid Doppler showed stable moderate left ICA stenosis.  Repeat study in February 2020.   Disposition:   FU with me in 6 months  Signed,  [name], MD  04/03/2018 11:05 AM    [place]

## 2018-04-06 ENCOUNTER — Other Ambulatory Visit: Payer: Self-pay | Admitting: Cardiovascular Disease
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
## 2018-04-06 NOTE — Telephone Encounter (Signed)
Spoke with pharmacist and explained circumstances, refill given. Left message, ok per [facility]

## 2018-05-24 ENCOUNTER — Encounter (HOSPITAL_COMMUNITY): Payer: Self-pay

## 2018-05-24 ENCOUNTER — Ambulatory Visit: Payer: Self-pay | Admitting: Vascular Surgery

## 2018-05-25 ENCOUNTER — Encounter: Payer: Self-pay | Admitting: Vascular Surgery

## 2018-08-21 ENCOUNTER — Ambulatory Visit (HOSPITAL_COMMUNITY)
Admission: RE | Admit: 2018-08-21 | Discharge: 2018-08-21 | Disposition: A | Discharge status: Home / Self Care | Payer: Medicare Other | Source: Ambulatory Visit | Attending: Cardiology | Admitting: Cardiology

## 2018-08-21 ENCOUNTER — Ambulatory Visit (HOSPITAL_BASED_OUTPATIENT_CLINIC_OR_DEPARTMENT_OTHER)
Admission: RE | Admit: 2018-08-21 | Discharge: 2018-08-21 | Disposition: A | Discharge status: Home / Self Care | Payer: Medicare Other | Source: Ambulatory Visit | Attending: Cardiology | Admitting: Cardiology

## 2018-08-21 DIAGNOSIS — I6523 Occlusion and stenosis of bilateral carotid arteries: Secondary | ICD-10-CM

## 2018-08-21 DIAGNOSIS — I739 Peripheral vascular disease, unspecified: Secondary | ICD-10-CM

## 2018-08-24 ENCOUNTER — Telehealth: Payer: Self-pay | Admitting: *Deleted

## 2018-08-24 DIAGNOSIS — I6523 Occlusion and stenosis of bilateral carotid arteries: Secondary | ICD-10-CM

## 2018-08-24 DIAGNOSIS — I739 Peripheral vascular disease, unspecified: Secondary | ICD-10-CM

## 2018-08-24 NOTE — Telephone Encounter (Signed)
Patient made aware of results and verbalized understanding. Repeat orders placed.    

## 2018-08-24 NOTE — Telephone Encounter (Signed)
-----   Message from Wellington Hampshire, MD sent at 08/22/2018 11:25 AM EST ----- Normal ABI and patent grafts. Repeat studies in 1 year.

## 2018-09-01 ENCOUNTER — Other Ambulatory Visit: Payer: Self-pay | Admitting: Physician Assistant

## 2018-10-01 ENCOUNTER — Telehealth: Payer: Self-pay | Admitting: *Deleted

## 2018-10-01 NOTE — Telephone Encounter (Signed)
Left a message for the patient to call back as part of the COVID-19 prescreening protocol prior to her appointment tomorrow.

## 2018-10-01 NOTE — Telephone Encounter (Signed)
   Primary Cardiologist:  [name], MD Patient contacted.  History reviewed.  No symptoms to suggest any unstable cardiac conditions.  Based on discussion, with current pandemic situation, we will be postponing this appointment for the patient.  If symptoms change, she has been instructed to contact our office.   Routing to C19 CANCEL pool for tracking (P CV DIV CV19 CANCEL) and assigning priority (1 = 4-6 wks, 2 = 6-12 wks, 3 = >12 wks).  Ricci Barker, RN  10/01/2018 9:04 AM         .

## 2018-10-01 NOTE — Telephone Encounter (Signed)
Patient returning call re: covid screen

## 2018-10-02 ENCOUNTER — Ambulatory Visit: Payer: Medicare Other | Admitting: Cardiovascular Disease

## 2018-12-10 ENCOUNTER — Telehealth: Payer: Self-pay | Admitting: *Deleted

## 2018-12-10 NOTE — Telephone Encounter (Signed)
A message was left, re: follow up visit. 

## 2019-02-13 ENCOUNTER — Other Ambulatory Visit: Payer: Self-pay | Admitting: Physician Assistant

## 2019-03-15 ENCOUNTER — Encounter: Payer: Self-pay | Admitting: Cardiovascular Disease

## 2019-03-19 ENCOUNTER — Ambulatory Visit (INDEPENDENT_AMBULATORY_CARE_PROVIDER_SITE_OTHER): Payer: Medicare Other | Admitting: Cardiovascular Disease

## 2019-03-19 ENCOUNTER — Other Ambulatory Visit: Payer: Self-pay

## 2019-03-19 VITALS — BP 164/50 | HR 53 | Temp 97.1°F | Ht 64.0 in | Wt 157.0 lb

## 2019-03-19 DIAGNOSIS — I10 Essential (primary) hypertension: Secondary | ICD-10-CM | POA: Diagnosis not present

## 2019-03-19 DIAGNOSIS — E782 Mixed hyperlipidemia: Secondary | ICD-10-CM

## 2019-03-19 DIAGNOSIS — I779 Disorder of arteries and arterioles, unspecified: Secondary | ICD-10-CM

## 2019-03-19 DIAGNOSIS — I2510 Atherosclerotic heart disease of native coronary artery without angina pectoris: Secondary | ICD-10-CM | POA: Diagnosis not present

## 2019-03-19 DIAGNOSIS — I739 Peripheral vascular disease, unspecified: Secondary | ICD-10-CM

## 2019-03-19 DIAGNOSIS — I6523 Occlusion and stenosis of bilateral carotid arteries: Secondary | ICD-10-CM | POA: Diagnosis not present

## 2019-03-19 MED ORDER — CARVEDILOL 12.5 MG PO TABS: 12.5000 mg | ORAL_TABLET | Freq: Two times a day (BID) | ORAL | 3 refills | Status: DC

## 2019-03-19 NOTE — Patient Instructions (Addendum)
Medication Instructions:  - STOP TAKING METOPROLOL  - START TAKING CARVEDILOL 12.5 MG  ONE TABLET TWICE A DAY     If you need a refill on your cardiac medications before your next appointment, please call your pharmacy.   Lab work: NOT NEEDED   Testing/Procedures:  KEEP SCHEDULE APPOINTMENT IN FEB FOR VASCULAR DOPPLERS  Follow-Up: At [facility], you and your health needs are our priority.  As part of our continuing mission to provide you with exceptional heart care, we have created designated Provider Care Teams.  These Care Teams include your primary Cardiologist (physician) and Advanced Practice Providers (APPs -  Physician Assistants and Nurse Practitioners) who all work together to provide you with the care you need, when you need it. . You will need a follow up appointment in [place] TEST  - MARCH 2021 .  Please call our office 2 months in advance to schedule this appointment.  You may see Dr Fletcher Anon     Any Other Special Instructions Will Be Listed Below (If Applicable).

## 2019-03-19 NOTE — Progress Notes (Signed)
Cardiology Office Note   Date:  03/19/2019   ID:  [name], DOB 11-12-1955, MRN [id number]  PCP:  Nicholes Rough, PA-C  Cardiologist:   Dr. Stanford Breed  Chief Complaint  Patient presents with  . Other    6 month follow up. patient c/o BP running a little high. Meds reviewed verbally with paitent.       History of Present Illness: [name] is a 62 y.o. female who presents for a followup visit regarding peripheral arterial disease. She has known history of  coronary artery status post coronary bypass graft surgery in 2004. She is known to have moderate bilateral carotid stenosis.  She also has prolonged history of diabetes with  chronic kidney disease and  diabetic neuropathy.  She is s/p left external iliac/common femoral and profunda endarterectomy with extended profundoplasty, ligation of the left superficial femoral artery and femoral to below the knee popliteal artery bypass with a vein graft in 05/18 and more recently right femoral to below-knee popliteal artery bypass in August, 2018.  Revascularization was performed for critical limb ischemia. She had GI bleed on small dose Xarelto which was started for peripheral arterial disease.  EGD was unremarkable.  She was taken off Xarelto.   She had a significant drop in ABI on the left side in 2019.  CO2 angiography in August 2019 showed patent left femoral to below the knee popliteal artery bypass with discrete 80% stenosis in the mid to distal segment.  This was treated successfully with drug-coated balloon angioplasty.  She has been doing well with no recent chest pain, shortness of breath or palpitations.  She takes her medications regularly.  She has noted episodes of dizziness recently.  She reports that her metoprolol was changed to once daily by her nephrologist.   Past Medical History:  Diagnosis Date  . Adenomatous polyp of colon   . Anemia   . Arthritis   . Asthma   . Carotid artery disease ([place])   . Carpal tunnel  syndrome   . Chronic renal insufficiency    Stage 3 Kidney disease  . Coronary artery disease   . Depression   . Depression with anxiety   . Diabetes mellitus    type II  . Diverticulosis of colon (without mention of hemorrhage)   . GERD (gastroesophageal reflux disease)   . Heart murmur   . History of hiatal hernia   . Hyperlipidemia   . Hypertension   . Hypothyroidism   . Obesity   . PVD (peripheral vascular disease) ([place])   . Thyroid disease    hypo    Past Surgical History:  Procedure Laterality Date  . ABDOMINAL AORTAGRAM N/A 12/18/2013   Procedure: ABDOMINAL [name];  Surgeon: Wellington Hampshire, MD;  Location: [place] CATH LAB;  Service: Cardiovascular;  Laterality: N/A;  . ABDOMINAL AORTOGRAM W/LOWER EXTREMITY N/A 11/16/2016   Procedure: Abdominal Aortogram w/Lower Extremity;  Surgeon: Wellington Hampshire, MD;  Location: [place] CV LAB;  Service: Cardiovascular;  Laterality: N/A;  . ABDOMINAL AORTOGRAM W/LOWER EXTREMITY N/A 01/25/2017   Procedure: Abdominal Aortogram w/Lower Extremity;  Surgeon: Wellington Hampshire, MD;  Location: [place] CV LAB;  Service: Cardiovascular;  Laterality: N/A;  only completed Lower Extremity  . ABDOMINAL AORTOGRAM W/LOWER EXTREMITY N/A 02/28/2018   Procedure: ABDOMINAL AORTOGRAM W/LOWER EXTREMITY;  Surgeon: Wellington Hampshire, MD;  Location: [place] CV LAB;  Service: Cardiovascular;  Laterality: N/A;  . ABDOMINAL HYSTERECTOMY    . APPENDECTOMY    .  APPLICATION OF WOUND VAC Right 02/13/2017   Procedure: APPLICATION OF INCISIONAL WOUND VAC RIGHT GROIN;  Surgeon: Elam Dutch, MD;  Location: [place];  Service: Vascular;  Laterality: Right;  . CHOLECYSTECTOMY    . CORONARY ARTERY BYPASS GRAFT  2004   x4  . ENDARTERECTOMY FEMORAL Left 11/23/2016   Procedure: ENDARTERECTOMY OF LEFT EXTERNAL COMMON FEMORAL ARTERY WITH EXTENDED LEFT PROFUNDOPLASTY;  Surgeon: Elam Dutch, MD;  Location: [place];  Service: Vascular;  Laterality: Left;  .  ENDARTERECTOMY FEMORAL Right 02/13/2017   Procedure: ENDARTERECTOMY RIGHT FEMORAL ARTERY WITH PROFUNDAPLASTY;  Surgeon: Elam Dutch, MD;  [facility];  Service: Vascular;  Laterality: Right;  . ESOPHAGOGASTRODUODENOSCOPY N/A 08/02/2017   Procedure: ESOPHAGOGASTRODUODENOSCOPY (EGD);  Surgeon: Ladene Artist, MD;  Location[facility]COPY;  Service: Endoscopy;  Laterality: N/A;  . FEMORAL-POPLITEAL BYPASS GRAFT Left 11/23/2016   Procedure: LEFT FEMORAL-BELOW KNEE POPLITEAL ARTERY BYPASS;  Surgeon: Elam Dutch, MD;  Location: [place];  Service: Vascular;  Laterality: Left;  . FEMORAL-POPLITEAL BYPASS GRAFT Right 02/13/2017   Procedure: BYPASS GRAFT RIGHT FEMORAL-BELOW KNEE POPLITEAL ARTERY;  Surgeon: Elam Dutch, MD;  Location: [place];  Service: Vascular;  Laterality: Right;  . KNEE SURGERY Right 2013   arthroscopy  . PATCH ANGIOPLASTY Right 02/13/2017   Procedure: VEIN PATCH ANGIOPLASTY RIGHT POPLITEAL ARTERY AND HEMASHIELD PATCH ANGIOPLASTY OF RIGHT FEMORAL ARTERY;  Surgeon: Elam Dutch, MD;  Location: [place];  Service: Vascular;  Laterality: Right;  . PERIPHERAL VASCULAR BALLOON ANGIOPLASTY  02/28/2018   Procedure: PERIPHERAL VASCULAR BALLOON ANGIOPLASTY;  Surgeon: Wellington Hampshire, MD;  Location: [place] CV LAB;  Service: Cardiovascular;;  Left Fem-pop Bypass     Current Outpatient Medications  Medication Sig Dispense Refill  . albuterol (PROVENTIL HFA;VENTOLIN HFA) 108 (90 BASE) MCG/ACT inhaler Inhale 2 puffs into the lungs every 6 (six) hours as needed for wheezing or shortness of breath.     . ALPRAZolam (XANAX) 1 MG tablet Take 1 mg by mouth 2 (two) times daily as needed for anxiety.     Marland Kitchen amLODipine (NORVASC) 5 MG tablet Take 1 tablet (5 mg total) by mouth at bedtime. 90 tablet 3  . aspirin EC 81 MG tablet Take 81 mg by mouth at bedtime.    Marland Kitchen atorvastatin (LIPITOR) 80 MG tablet Take 80 mg by mouth daily.     . calcitRIOL (ROCALTROL) 0.25 MCG capsule Take 0.25 mcg by  mouth every Monday, Wednesday, and Friday.   6  . cetirizine (ZYRTEC) 10 MG tablet Take 10 mg by mouth daily.  6  . famotidine (PEPCID) 40 MG tablet TAKE 1 TABLET BY MOUTH EVERY DAY 60 tablet 0  . fenofibrate micronized (LOFIBRA) 200 MG capsule Take 200 mg by mouth at bedtime.     . ferrous sulfate 325 (65 FE) MG tablet Take 325 mg by mouth 3 (three) times daily.  3  . furosemide (LASIX) 40 MG tablet Take 40 mg by mouth daily.     . Insulin Glargine (BASAGLAR KWIKPEN) 100 UNIT/ML SOPN Inject 20-60 Units into the skin 2 (two) times daily before a meal. Per sliding scale  3  . levothyroxine (SYNTHROID, LEVOTHROID) 150 MCG tablet Take 150 mcg by mouth daily before breakfast.    . loperamide (IMODIUM) 2 MG capsule Take 4 mg by mouth as needed for diarrhea or loose stools.    . metoprolol tartrate (LOPRESSOR) 100 MG tablet Take 100 mg by mouth 2 (two) times daily.  6  . Omega-3 Fatty Acids (FISH OIL) 1000 MG CAPS Take 1,000 mg by mouth 2 (two) times daily.    Marland Kitchen PARoxetine (PAXIL) 40 MG tablet Take 60 mg by mouth at bedtime.     . prednisoLONE acetate (PRED FORTE) 1 % ophthalmic suspension Place 1 drop into the right eye 2 (two) times daily.    . promethazine (PHENERGAN) 25 MG tablet Take 25 mg by mouth daily as needed for nausea or vomiting.   6  . Vitamin D, Ergocalciferol, (DRISDOL) 50000 UNITS CAPS capsule Take 50,000 Units by mouth 2 (two) times a week. Takes on Mondays and Fridays  1   No current facility-administered medications for this visit.     Allergies:   Patient has no known allergies.    Social History:  The patient  reports that she quit smoking about 2 years ago. Her smoking use included cigarettes. She has a 30.00 pack-year smoking history. She has never used smokeless tobacco. She reports that she does not drink alcohol or use drugs.   Family History:  The patient's family history includes Colon polyps in her daughter; Diabetes in her mother; Heart disease in her father.     ROS:  Please see the history of present illness.   Otherwise, review of systems are positive for none.   All other systems are reviewed and negative.    PHYSICAL EXAM: VS:  BP (!) 164/50 (BP Location: Left Arm, Patient Position: Sitting, Cuff Size: Normal)   Pulse (!) 53   Temp (!) 97.1 F (36.2 C)   Ht 5\' 4"  (1.626 m)   Wt 157 lb (71.2 kg)   BMI 26.95 kg/m  , BMI Body mass index is 26.95 kg/m. GEN: Well nourished, well developed, in no acute distress  HEENT: normal  Neck: no JVD,  or masses. Bilateral carotid bruits. Cardiac: RRR; no rubs, or gallops,no edema . 2/6 systolic ejection murmur in the aortic area. Respiratory:  clear to auscultation bilaterally, normal work of breathing GI: soft, nontender, nondistended, + BS MS: no deformity or atrophy  Skin: warm and dry, no rash Neuro:  Strength and sensation are intact Psych: euthymic mood, full affect Vascular: Surgical scar is noted in both groins.  Femoral pulses normal bilaterally.  Distal pulses are faint but palpable bilaterally  EKG:  EKG is ordered today.  EKG showed sinus bradycardia with possible left atrial enlargement, inferolateral ST changes suggestive of ischemia.   Recent Labs: No results found for requested labs within last 8760 hours.    Lipid Panel No results found for: CHOL, TRIG, HDL, CHOLHDL, VLDL, LDLCALC, LDLDIRECT    Wt Readings from Last 3 Encounters:  03/19/19 157 lb (71.2 kg)  04/03/18 170 lb (77.1 kg)  03/07/18 165 lb (74.8 kg)         ASSESSMENT AND PLAN:  1. Peripheral arterial disease status post successful surgical revascularization of bilateral lower extremities.   Status post  drug-coated balloon angioplasty to the left femoral-popliteal bypass.  Repeat vascular Doppler in February.  Continue low-dose aspirin.  2. Coronary artery disease: She has no chest pain. Stable exertional dyspnea.  3. Essential hypertension: She reports that her blood pressure has been elevated with  episodes of dizziness which could be due to bradycardia induced by metoprolol.  I elected to switch her from metoprolol to carvedilol 12.5 mg twice daily.  4.  Mixed hyperlipidemia: Currently on atorvastatin and fenofibrate.    5. Bilateral carotid stenosis: carotid Doppler showed stable moderate left  ICA stenosis.  Repeat study in February 2021.   Disposition:   FU with me in 6 months  Signed,  [name], MD  03/19/2019 9:43 AM    [place]

## 2019-04-10 ENCOUNTER — Other Ambulatory Visit: Payer: Self-pay | Admitting: Physician Assistant

## 2019-04-10 DIAGNOSIS — Z1231 Encounter for screening mammogram for malignant neoplasm of breast: Secondary | ICD-10-CM

## 2019-05-28 ENCOUNTER — Ambulatory Visit
Admission: RE | Admit: 2019-05-28 | Discharge: 2019-05-28 | Disposition: A | Discharge status: Home / Self Care | Payer: Medicare Other | Source: Ambulatory Visit | Attending: Physician Assistant | Admitting: Physician Assistant

## 2019-05-28 ENCOUNTER — Other Ambulatory Visit: Payer: Self-pay

## 2019-05-28 DIAGNOSIS — Z1231 Encounter for screening mammogram for malignant neoplasm of breast: Secondary | ICD-10-CM

## 2019-06-12 ENCOUNTER — Other Ambulatory Visit: Payer: Self-pay

## 2019-06-12 MED ORDER — FAMOTIDINE 40 MG PO TABS: 40.0000 mg | ORAL_TABLET | Freq: Every day | ORAL | 0 refills | Status: DC

## 2019-06-18 ENCOUNTER — Other Ambulatory Visit: Payer: Self-pay | Admitting: Physician Assistant

## 2019-07-08 ENCOUNTER — Other Ambulatory Visit: Payer: Self-pay | Admitting: Vascular Surgery

## 2019-07-08 DIAGNOSIS — I739 Peripheral vascular disease, unspecified: Secondary | ICD-10-CM

## 2019-07-08 DIAGNOSIS — Z95828 Presence of other vascular implants and grafts: Secondary | ICD-10-CM

## 2019-07-08 DIAGNOSIS — I779 Disorder of arteries and arterioles, unspecified: Secondary | ICD-10-CM

## 2019-08-12 IMAGING — DX DG CHEST 2V
2 series · 2 of 2 positions shown · non-contrast
Comparison: 05/07/2004.

CLINICAL DATA: Rectal bleeding.  Diarrhea.

EXAM:
CHEST  2 VIEW

[chest pa]
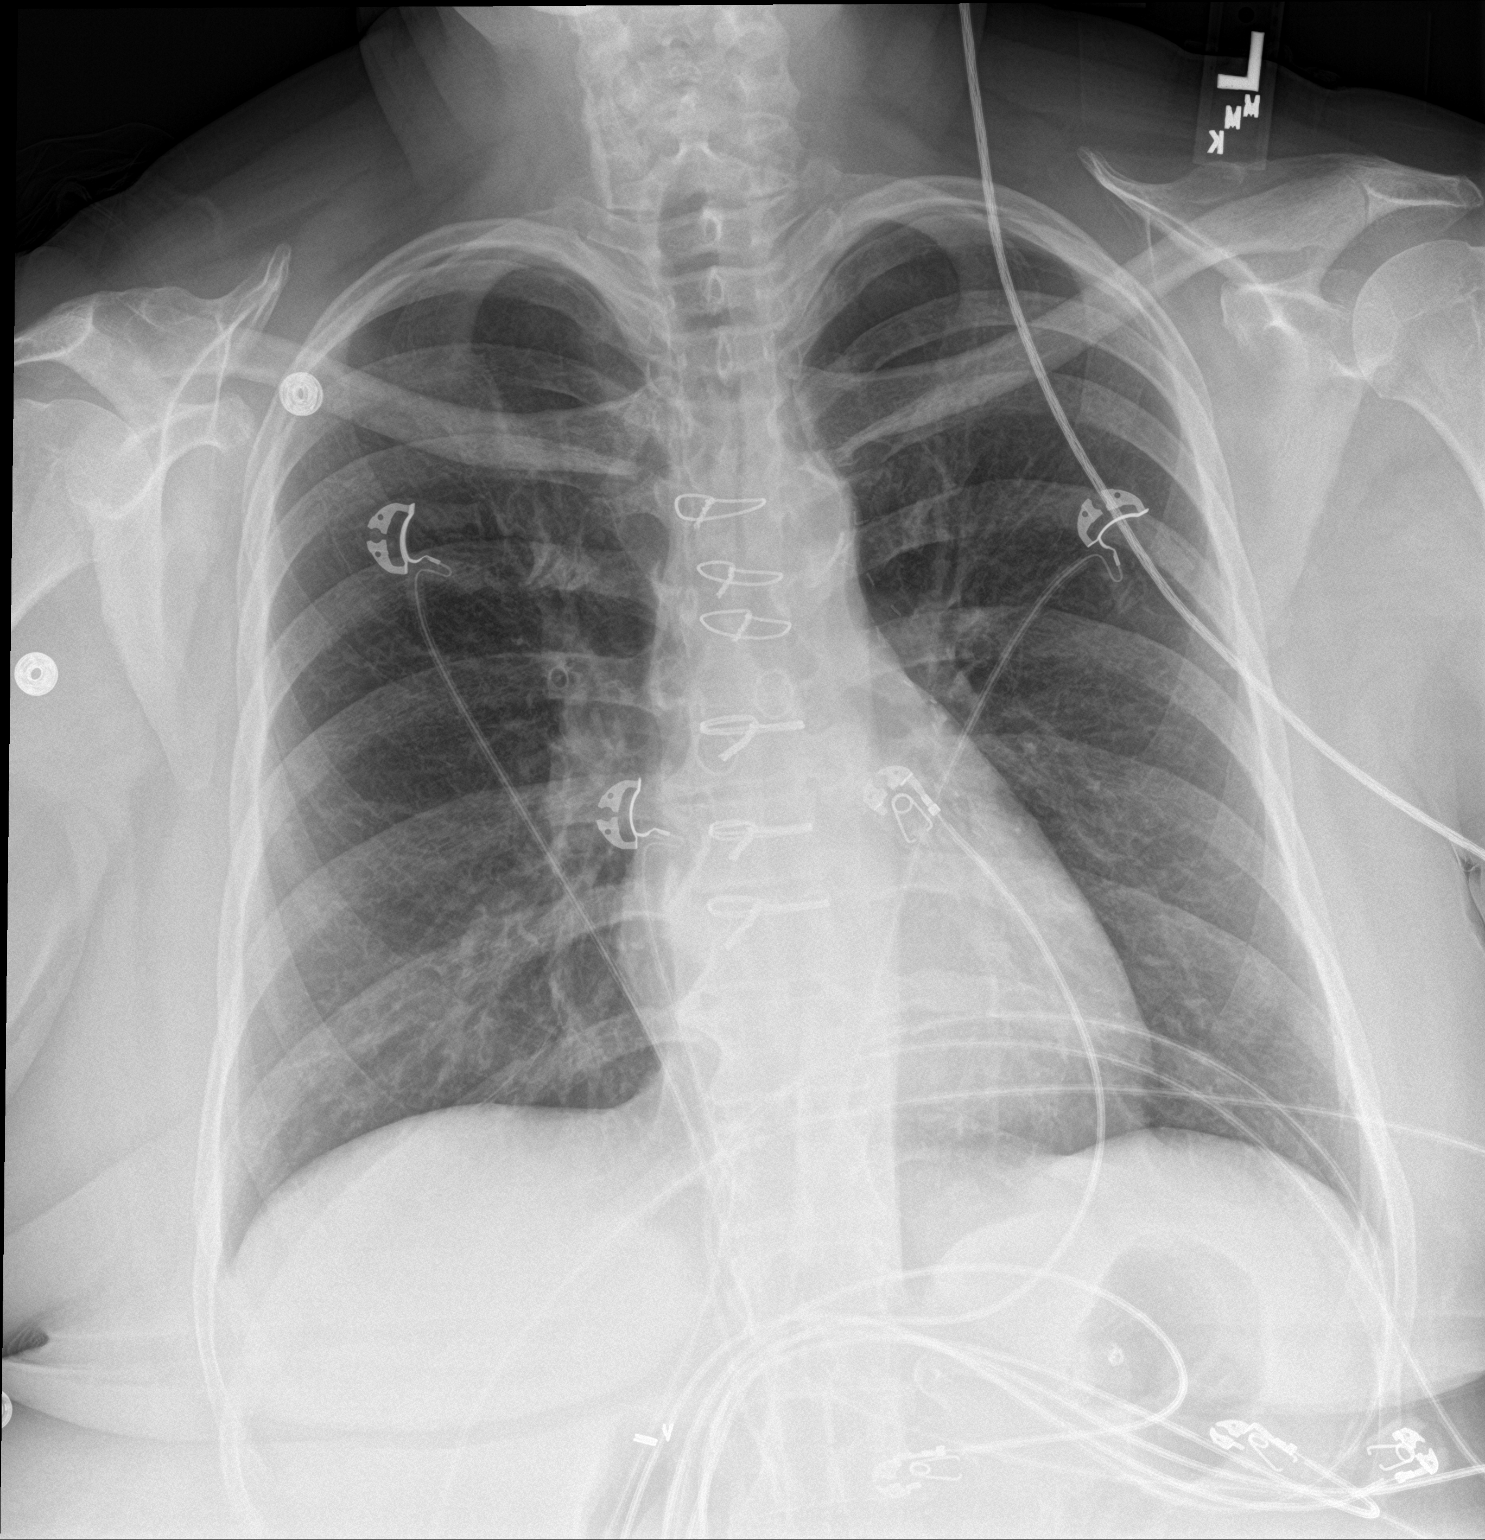

[chest lat]
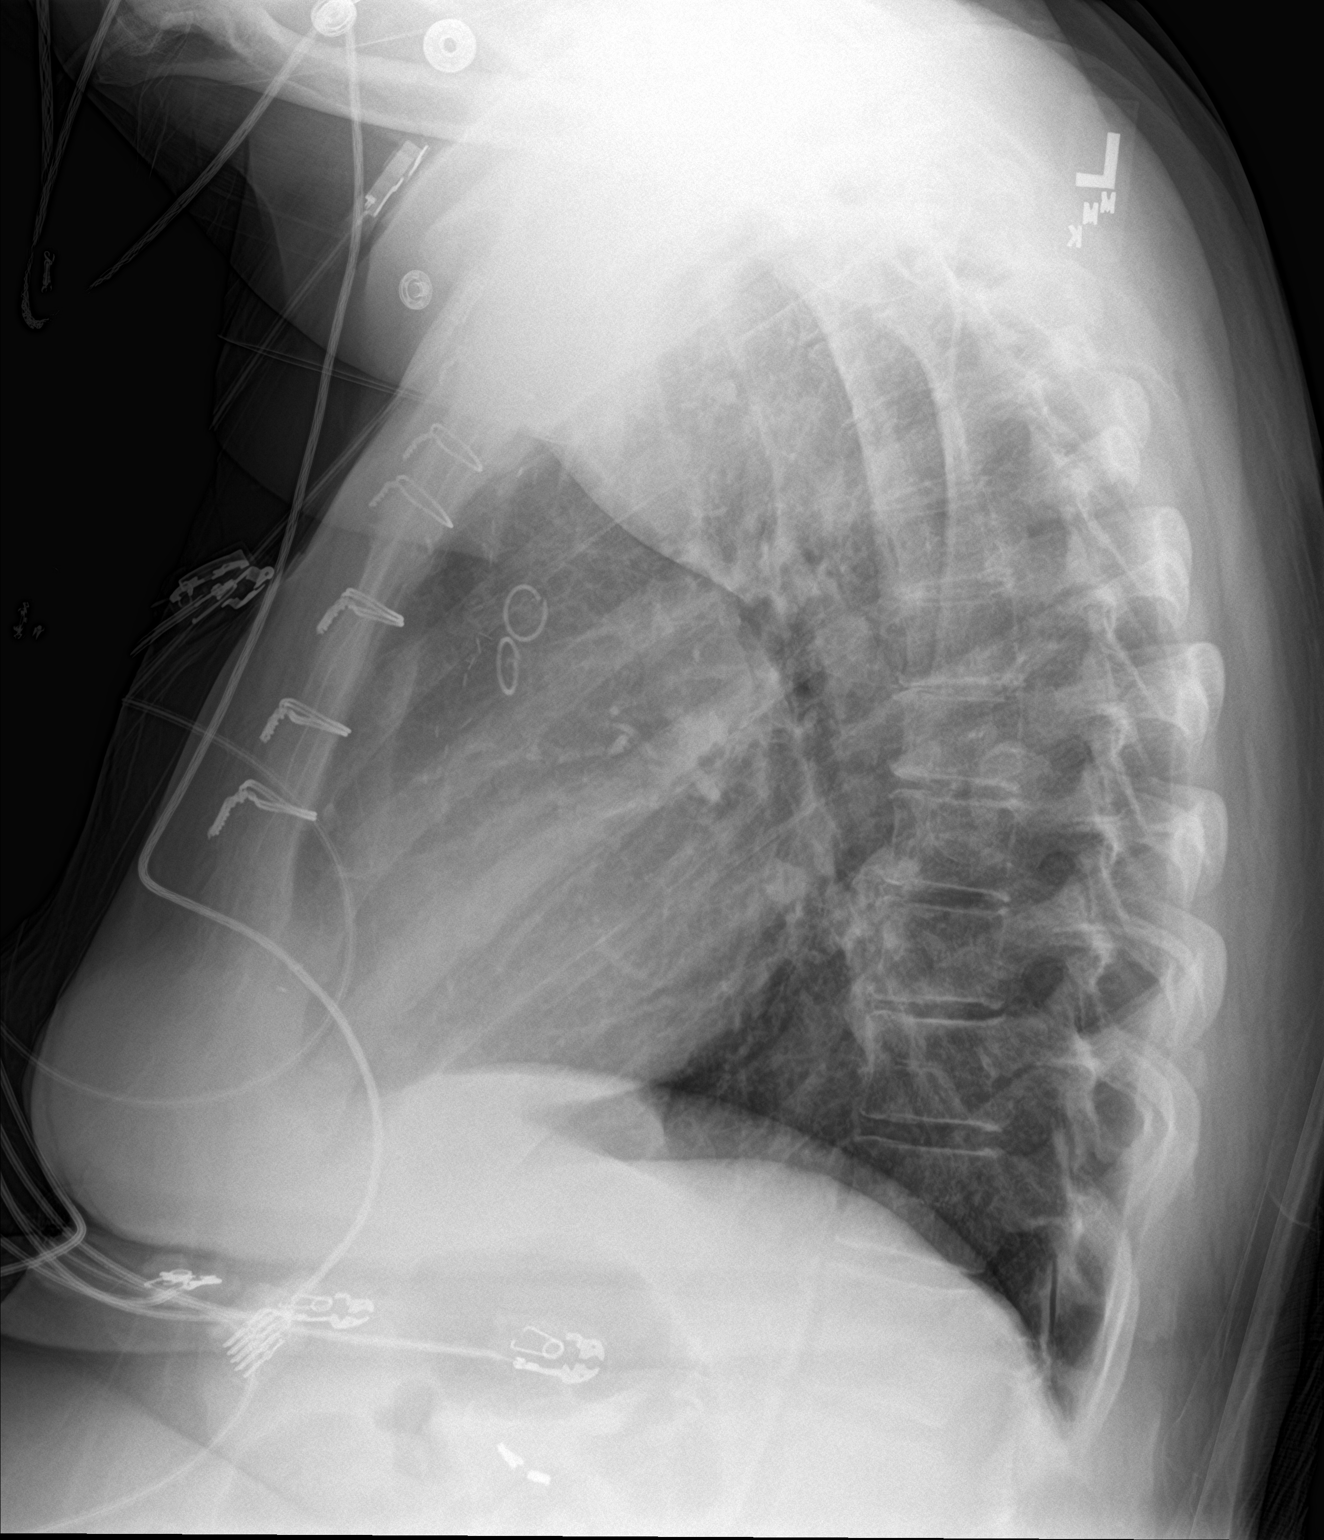

[2 of 2 positions shown; findings below may reference images not displayed]

FINDINGS: Mediastinum and hilar structures normal. Prior CABG. Heart size
normal. No focal infiltrate. No pleural effusion or pneumothorax.
IMPRESSION: No acute cardiopulmonary disease.  Prior CABG.  Heart size normal.

## 2019-08-12 IMAGING — CT CT ABD-PELV W/O CM
2 of 4 series · 16 of 46 positions shown, 18 images · non-contrast
Comparison: Report 12/28/2001

CLINICAL DATA: Dark tarry stools for 1 week fatigue and shortness
of breath

EXAM:
CT ABDOMEN AND PELVIS WITHOUT CONTRAST
TECHNIQUE: Multidetector CT imaging of the abdomen and pelvis was performed
following the standard protocol without IV contrast.

[Series 3: abd/ pelvis 5.0 i30f 2 · axial · 0.79mm/px · z∈[-72,+358]mm · 13 of 94 slices shown, 15 images]
[im 4/94  soft-tissue]
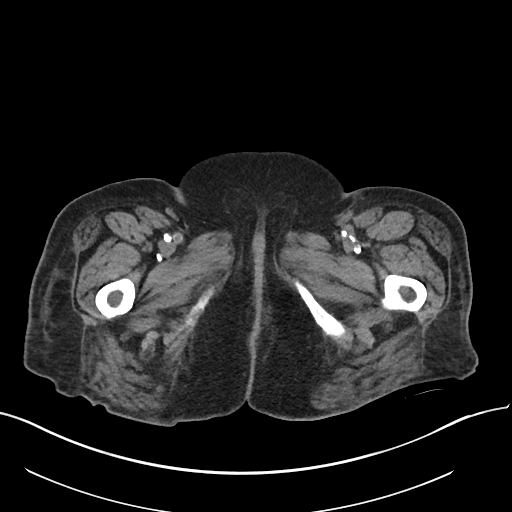
[im 4/94  bone]
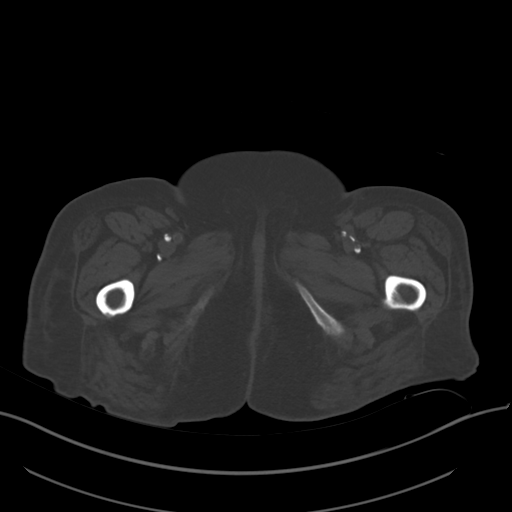
[im 12/94  soft-tissue]
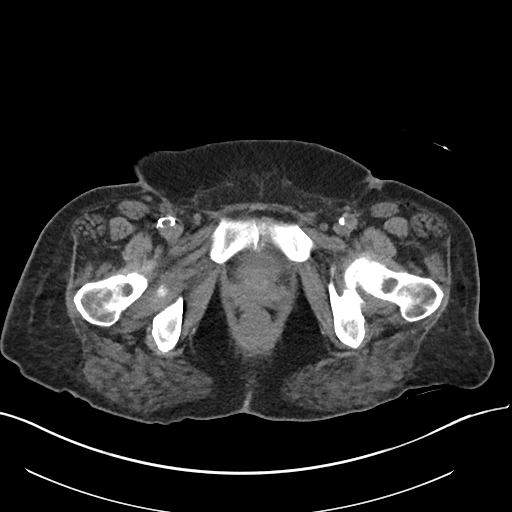
[im 20/94  soft-tissue]
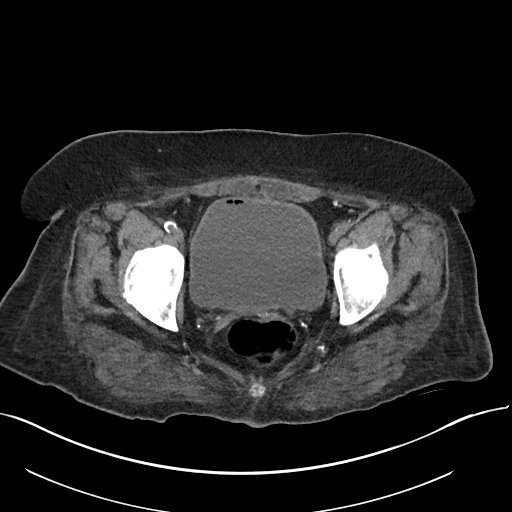
[im 28/94  soft-tissue]
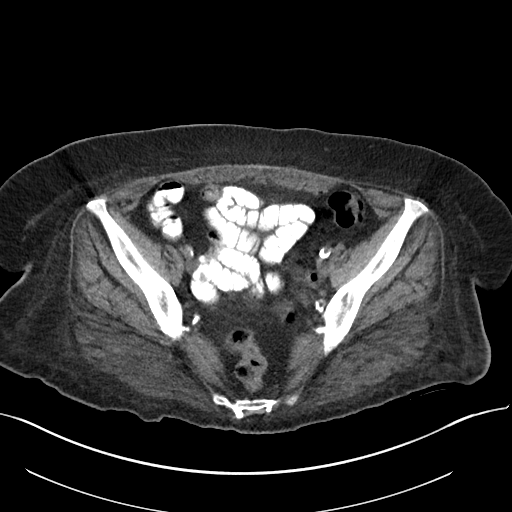
[im 32/94  soft-tissue]
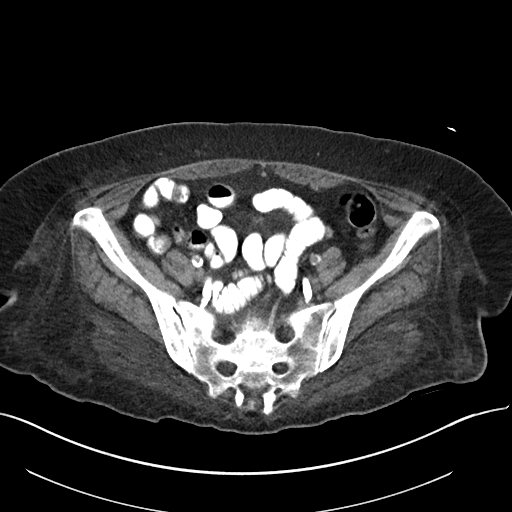
[im 39/94  soft-tissue]
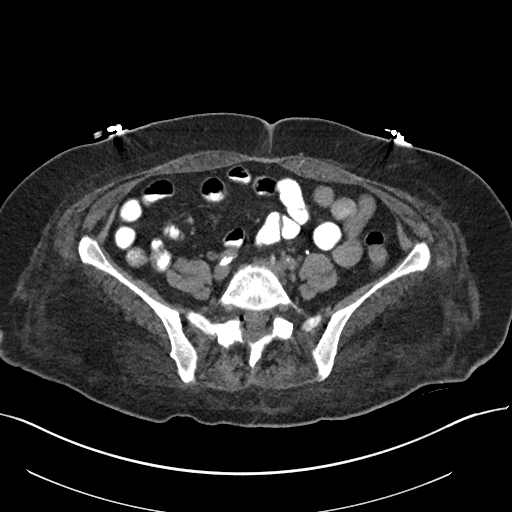
[im 47/94  soft-tissue]
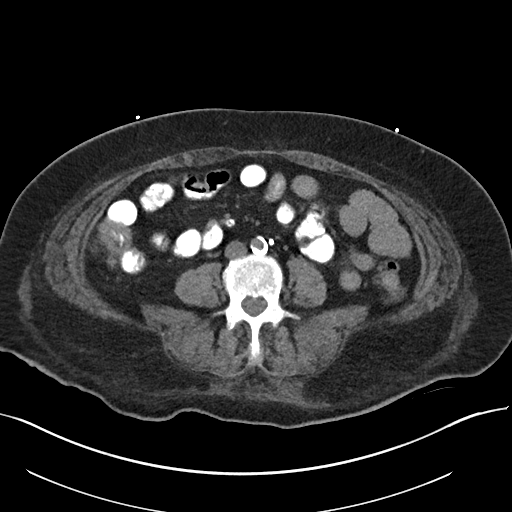
[im 55/94  soft-tissue]
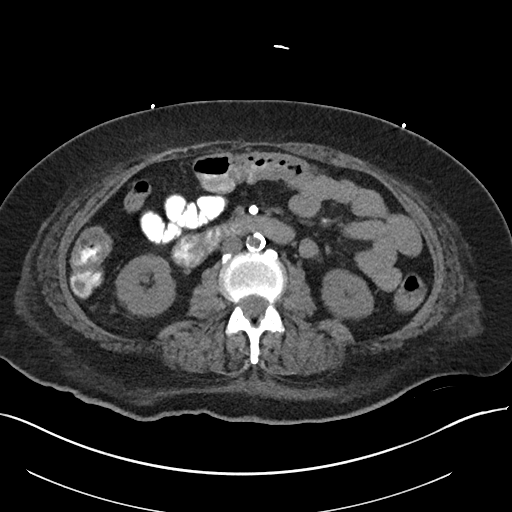
[im 63/94  soft-tissue]
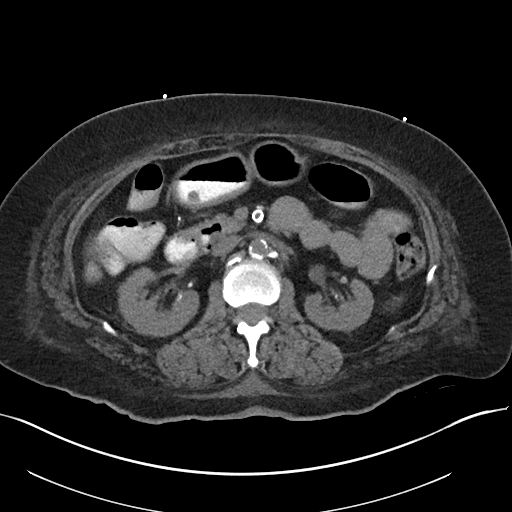
[im 63/94  bone]
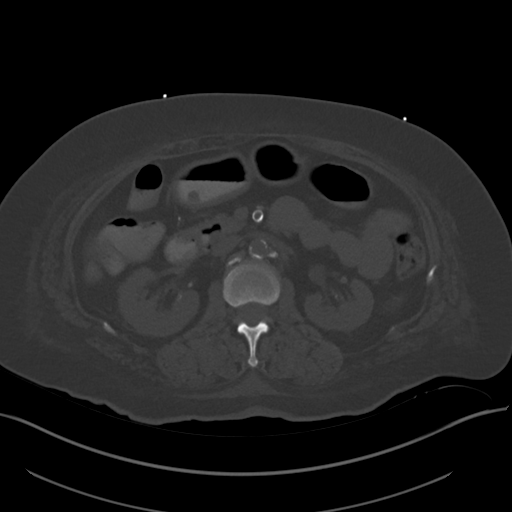
[im 66/94  soft-tissue]
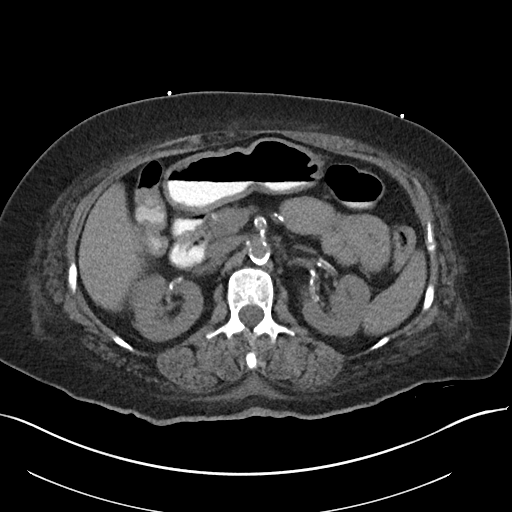
[im 74/94  soft-tissue]
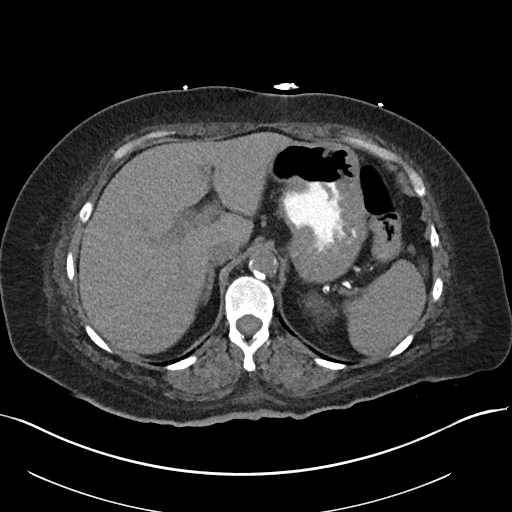
[im 82/94  soft-tissue]
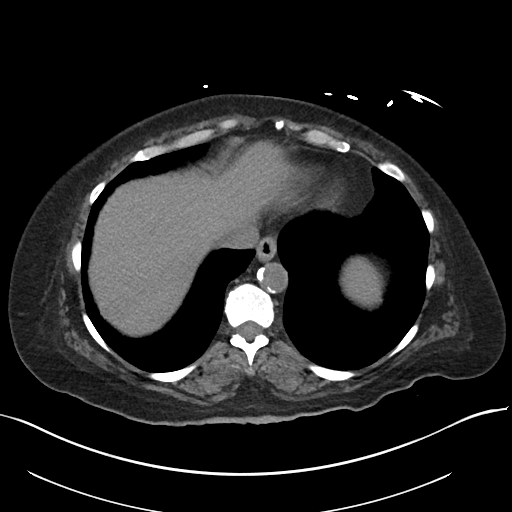
[im 90/94  soft-tissue]
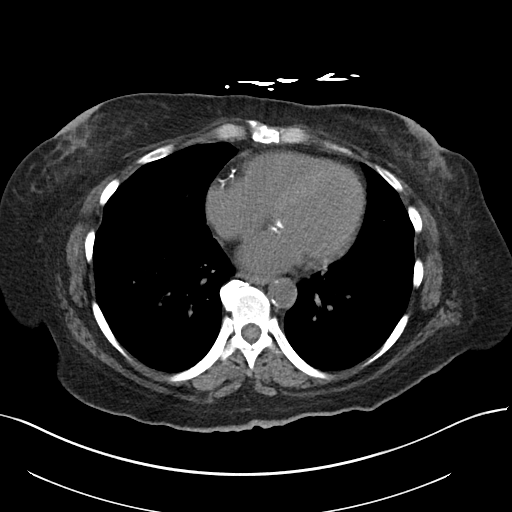

[Series 6: cor st · coronal · 0.89mm/px · 3 of 101 slices shown]
[im 34/101  soft-tissue]
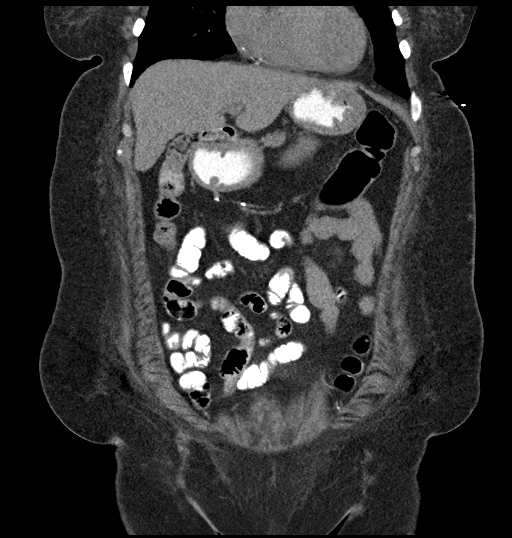
[im 45/101  soft-tissue]
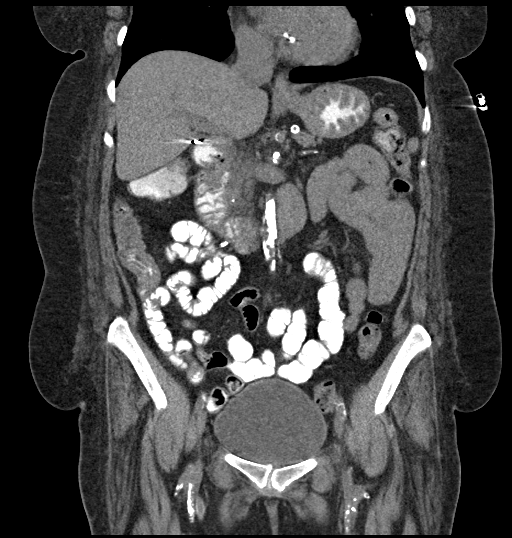
[im 56/101  soft-tissue]
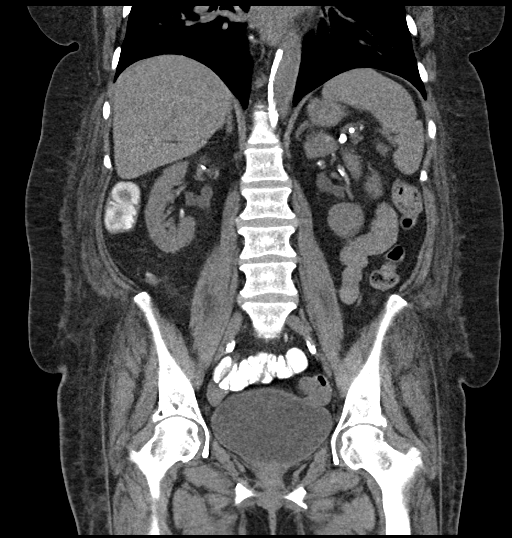

[16 of 46 positions shown; findings below may reference images not displayed]

FINDINGS: Lower chest: Lung bases demonstrate no acute consolidation or
pleural effusion. Coronary artery calcification.

Hepatobiliary: No focal hepatic abnormality. Surgical clips at the
gallbladder fossa. Prominent extrahepatic bile duct likely due to
postsurgical change

Pancreas: Unremarkable. No pancreatic ductal dilatation or
surrounding inflammatory changes.

Spleen: Normal in size without focal abnormality.

Adrenals/Urinary Tract: Adrenal glands are within normal limits.
Kidneys show no hydronephrosis. Probable cyst in the lower pole of
left kidney. Intrarenal vascular calcifications. Small amount of air
in the bladder, query any recent history of instrumentation.

Stomach/Bowel: Possible wall thickening of the GE junction and
proximal stomach. No dilated small bowel. Thickened appearance of
the terminal ileum and ileocecal region appendix is not well seen.

Vascular/Lymphatic: Extensive aortic atherosclerosis. No aneurysmal
dilatation. Marked calcifications of mesenteric branch vessels. No
significantly enlarged lymph nodes.

Reproductive: Status post hysterectomy. No adnexal masses.

Other: Negative for free air or free fluid

Musculoskeletal: Degenerative changes. No acute or suspicious bone
lesion
IMPRESSION: 1. Mild wall thickening of the terminal ileum and ileocecal
junction. Findings could be secondary to ileitis as may be seen with
infectious or inflammatory bowel disease; infiltrative mass is also
in the differential. Suggest correlation with direct visualization.
2. Borderline to slight thickening of the GE junction and proximal
stomach with possible gastric fold thickening. Consider gastritis
versus mass; this may also be evaluated with direct visualization.
3. Extensive vascular calcifications.
4. Small amount of air in the bladder, suspect that this may be
related to recent instrumentation.

## 2019-08-23 ENCOUNTER — Ambulatory Visit (HOSPITAL_COMMUNITY)
Admission: RE | Admit: 2019-08-23 | Payer: Medicare Other | Source: Ambulatory Visit | Attending: Cardiovascular Disease | Admitting: Cardiovascular Disease

## 2019-08-23 ENCOUNTER — Encounter (HOSPITAL_COMMUNITY): Payer: Medicare Other

## 2019-08-23 ENCOUNTER — Ambulatory Visit (HOSPITAL_COMMUNITY): Payer: Medicare Other

## 2019-09-03 ENCOUNTER — Ambulatory Visit (HOSPITAL_COMMUNITY)
Admission: RE | Admit: 2019-09-03 | Discharge: 2019-09-03 | Disposition: A | Discharge status: Home / Self Care | Payer: Medicare Other | Source: Ambulatory Visit | Attending: Cardiology | Admitting: Cardiology

## 2019-09-03 ENCOUNTER — Other Ambulatory Visit (HOSPITAL_COMMUNITY): Payer: Self-pay | Admitting: Cardiovascular Disease

## 2019-09-03 ENCOUNTER — Encounter (INDEPENDENT_AMBULATORY_CARE_PROVIDER_SITE_OTHER): Payer: Self-pay

## 2019-09-03 ENCOUNTER — Other Ambulatory Visit: Payer: Self-pay

## 2019-09-03 ENCOUNTER — Ambulatory Visit (HOSPITAL_BASED_OUTPATIENT_CLINIC_OR_DEPARTMENT_OTHER)
Admission: RE | Admit: 2019-09-03 | Discharge: 2019-09-03 | Disposition: A | Discharge status: Home / Self Care | Payer: Medicare Other | Source: Ambulatory Visit | Attending: Cardiovascular Disease | Admitting: Cardiovascular Disease

## 2019-09-03 DIAGNOSIS — I6523 Occlusion and stenosis of bilateral carotid arteries: Secondary | ICD-10-CM

## 2019-09-03 DIAGNOSIS — I739 Peripheral vascular disease, unspecified: Secondary | ICD-10-CM | POA: Diagnosis not present

## 2019-09-10 ENCOUNTER — Encounter: Payer: Self-pay | Admitting: Cardiovascular Disease

## 2019-09-10 ENCOUNTER — Other Ambulatory Visit: Payer: Self-pay

## 2019-09-10 ENCOUNTER — Ambulatory Visit (INDEPENDENT_AMBULATORY_CARE_PROVIDER_SITE_OTHER): Payer: Medicare Other | Admitting: Cardiovascular Disease

## 2019-09-10 ENCOUNTER — Other Ambulatory Visit: Payer: Self-pay | Admitting: *Deleted

## 2019-09-10 VITALS — BP 148/52 | HR 62 | Temp 96.8°F | Ht 65.0 in | Wt 152.0 lb

## 2019-09-10 DIAGNOSIS — I739 Peripheral vascular disease, unspecified: Secondary | ICD-10-CM

## 2019-09-10 DIAGNOSIS — I10 Essential (primary) hypertension: Secondary | ICD-10-CM | POA: Diagnosis not present

## 2019-09-10 DIAGNOSIS — I2510 Atherosclerotic heart disease of native coronary artery without angina pectoris: Secondary | ICD-10-CM

## 2019-09-10 DIAGNOSIS — I6523 Occlusion and stenosis of bilateral carotid arteries: Secondary | ICD-10-CM

## 2019-09-10 DIAGNOSIS — E785 Hyperlipidemia, unspecified: Secondary | ICD-10-CM

## 2019-09-10 NOTE — Progress Notes (Signed)
Cardiology Office Note   Date:  09/10/2019   ID:  [name], DOB 05-Oct-1955, MRN [id number]  PCP:  Nicholes Rough, PA-C  Cardiologist:   Dr. Stanford Breed  No chief complaint on file.     History of Present Illness: [name] is a 64 y.o. female who presents for a followup visit regarding peripheral arterial disease. She has known history of  coronary artery status post coronary bypass graft surgery in 2004. She is known to have moderate bilateral carotid stenosis.  She also has prolonged history of diabetes with  chronic kidney disease and  diabetic neuropathy.  She is s/p left external iliac/common femoral and profunda endarterectomy with extended profundoplasty, ligation of the left superficial femoral artery and femoral to below the knee popliteal artery bypass with a vein graft in 05/18 and more recently right femoral to below-knee popliteal artery bypass in August, 2018.  Revascularization was performed for critical limb ischemia. She had GI bleed on small dose Xarelto which was started for peripheral arterial disease.  EGD was unremarkable.  She was taken off Xarelto.   She had a significant drop in ABI on the left side in 2019.  CO2 angiography in August 2019 showed patent left femoral to below the knee popliteal artery bypass with discrete 80% stenosis in the mid to distal segment.  This was treated successfully with drug-coated balloon angioplasty.  She has done very well since then with no recurrent claudication.  No chest pain or shortness of breath.  She is watching her diet more carefully and continues to lose weight.  During last visit, I switch metoprolol to carvedilol due to elevated blood pressure and bradycardia.  Both improved.  Her dizziness resolved.   Past Medical History:  Diagnosis Date  . Adenomatous polyp of colon   . Anemia   . Arthritis   . Asthma   . Carotid artery disease ([place])   . Carpal tunnel syndrome   . Chronic renal insufficiency    Stage 3 Kidney  disease  . Coronary artery disease   . Depression   . Depression with anxiety   . Diabetes mellitus    type II  . Diverticulosis of colon (without mention of hemorrhage)   . GERD (gastroesophageal reflux disease)   . Heart murmur   . History of hiatal hernia   . Hyperlipidemia   . Hypertension   . Hypothyroidism   . Obesity   . PVD (peripheral vascular disease) ([place])   . Thyroid disease    hypo    Past Surgical History:  Procedure Laterality Date  . ABDOMINAL AORTAGRAM N/A 12/18/2013   Procedure: ABDOMINAL [name];  Surgeon: Wellington Hampshire, MD;  Location: [place] CATH LAB;  Service: Cardiovascular;  Laterality: N/A;  . ABDOMINAL AORTOGRAM W/LOWER EXTREMITY N/A 11/16/2016   Procedure: Abdominal Aortogram w/Lower Extremity;  Surgeon: Wellington Hampshire, MD;  Location: [place] CV LAB;  Service: Cardiovascular;  Laterality: N/A;  . ABDOMINAL AORTOGRAM W/LOWER EXTREMITY N/A 01/25/2017   Procedure: Abdominal Aortogram w/Lower Extremity;  Surgeon: Wellington Hampshire, MD;  Location: [place] CV LAB;  Service: Cardiovascular;  Laterality: N/A;  only completed Lower Extremity  . ABDOMINAL AORTOGRAM W/LOWER EXTREMITY N/A 02/28/2018   Procedure: ABDOMINAL AORTOGRAM W/LOWER EXTREMITY;  Surgeon: Wellington Hampshire, MD;  Location: [place] CV LAB;  Service: Cardiovascular;  Laterality: N/A;  . ABDOMINAL HYSTERECTOMY    . APPENDECTOMY    . APPLICATION OF WOUND VAC Right 02/13/2017   Procedure: APPLICATION  OF INCISIONAL WOUND VAC RIGHT GROIN;  Surgeon: Elam Dutch, MD;  Location: [place];  Service: Vascular;  Laterality: Right;  . CHOLECYSTECTOMY    . CORONARY ARTERY BYPASS GRAFT  2004   x4  . ENDARTERECTOMY FEMORAL Left 11/23/2016   Procedure: ENDARTERECTOMY OF LEFT EXTERNAL COMMON FEMORAL ARTERY WITH EXTENDED LEFT PROFUNDOPLASTY;  Surgeon: Elam Dutch, MD;  Location: [place];  Service: Vascular;  Laterality: Left;  . ENDARTERECTOMY FEMORAL Right 02/13/2017   Procedure: ENDARTERECTOMY  RIGHT FEMORAL ARTERY WITH PROFUNDAPLASTY;  Surgeon: Elam Dutch, MD;  Location: [facility] OR;  Service: Vascular;  Laterality: Right;  . ESOPHAGOGASTRODUODENOSCOPY N/A 08/02/2017   Procedure: ESOPHAGOGASTRODUODENOSCOPY (EGD);  Surgeon: Ladene Artist, MD;  Location[facility] ENDOSCOPY;  Service: Endoscopy;  Laterality: N/A;  . FEMORAL-POPLITEAL BYPASS GRAFT Left 11/23/2016   Procedure: LEFT FEMORAL-BELOW KNEE POPLITEAL ARTERY BYPASS;  Surgeon: Elam Dutch, MD;  Location: [place];  Service: Vascular;  Laterality: Left;  . FEMORAL-POPLITEAL BYPASS GRAFT Right 02/13/2017   Procedure: BYPASS GRAFT RIGHT FEMORAL-BELOW KNEE POPLITEAL ARTERY;  Surgeon: Elam Dutch, MD;  Location: [place];  Service: Vascular;  Laterality: Right;  . KNEE SURGERY Right 2013   arthroscopy  . PATCH ANGIOPLASTY Right 02/13/2017   Procedure: VEIN PATCH ANGIOPLASTY RIGHT POPLITEAL ARTERY AND HEMASHIELD PATCH ANGIOPLASTY OF RIGHT FEMORAL ARTERY;  Surgeon: Elam Dutch, MD;  Location: [place];  Service: Vascular;  Laterality: Right;  . PERIPHERAL VASCULAR BALLOON ANGIOPLASTY  02/28/2018   Procedure: PERIPHERAL VASCULAR BALLOON ANGIOPLASTY;  Surgeon: Wellington Hampshire, MD;  Location: [place] CV LAB;  Service: Cardiovascular;;  Left Fem-pop Bypass     Current Outpatient Medications  Medication Sig Dispense Refill  . albuterol (PROVENTIL HFA;VENTOLIN HFA) 108 (90 BASE) MCG/ACT inhaler Inhale 2 puffs into the lungs every 6 (six) hours as needed for wheezing or shortness of breath.     . ALPRAZolam (XANAX) 1 MG tablet Take 1 mg by mouth 2 (two) times daily as needed for anxiety.     Marland Kitchen amLODipine (NORVASC) 5 MG tablet Take 1 tablet (5 mg total) by mouth at bedtime. (Patient taking differently: Take 10 mg by mouth at bedtime. ) 90 tablet 3  . aspirin EC 81 MG tablet Take 81 mg by mouth at bedtime.    Marland Kitchen atorvastatin (LIPITOR) 80 MG tablet Take 80 mg by mouth daily.     . calcitRIOL (ROCALTROL) 0.25 MCG capsule Take 0.25 mcg by  mouth every Monday, Wednesday, and Friday.   6  . cetirizine (ZYRTEC) 10 MG tablet Take 10 mg by mouth daily.  6  . famotidine (PEPCID) 40 MG tablet TAKE 1 TABLET BY MOUTH EVERY DAY 60 tablet 1  . fenofibrate micronized (LOFIBRA) 200 MG capsule Take 200 mg by mouth at bedtime.     . ferrous sulfate 325 (65 FE) MG tablet Take 325 mg by mouth 3 (three) times daily.  3  . furosemide (LASIX) 40 MG tablet Take 40 mg by mouth daily.     . Insulin Glargine (BASAGLAR KWIKPEN) 100 UNIT/ML SOPN Inject 20-60 Units into the skin 2 (two) times daily before a meal. Per sliding scale  3  . levothyroxine (SYNTHROID, LEVOTHROID) 150 MCG tablet Take 150 mcg by mouth daily before breakfast.    . loperamide (IMODIUM) 2 MG capsule Take 4 mg by mouth as needed for diarrhea or loose stools.    . Omega-3 Fatty Acids (FISH OIL) 1000 MG CAPS Take 1,000 mg by mouth 2 (two) times  daily.    Marland Kitchen PARoxetine (PAXIL) 40 MG tablet Take 60 mg by mouth at bedtime.     . prednisoLONE acetate (PRED FORTE) 1 % ophthalmic suspension Place 1 drop into the right eye 2 (two) times daily.    . promethazine (PHENERGAN) 25 MG tablet Take 25 mg by mouth daily as needed for nausea or vomiting.   6  . Vitamin D, Ergocalciferol, (DRISDOL) 50000 UNITS CAPS capsule Take 50,000 Units by mouth 2 (two) times a week. Takes on Mondays and Fridays  1  . carvedilol (COREG) 12.5 MG tablet Take 1 tablet (12.5 mg total) by mouth 2 (two) times daily. 180 tablet 3   No current facility-administered medications for this visit.    Allergies:   Patient has no known allergies.    Social History:  The patient  reports that she quit smoking about 3 years ago. Her smoking use included cigarettes. She has a 30.00 pack-year smoking history. She has never used smokeless tobacco. She reports that she does not drink alcohol or use drugs.   Family History:  The patient's family history includes Colon polyps in her daughter; Diabetes in her mother; Heart disease in her  father.    ROS:  Please see the history of present illness.   Otherwise, review of systems are positive for none.   All other systems are reviewed and negative.    PHYSICAL EXAM: VS:  BP (!) 148/52   Pulse 62   Temp (!) 96.8 F (36 C)   Ht 5\' 5"  (1.651 m)   Wt 152 lb (68.9 kg)   SpO2 99%   BMI 25.29 kg/m  , BMI Body mass index is 25.29 kg/m. GEN: Well nourished, well developed, in no acute distress  HEENT: normal  Neck: no JVD,  or masses. Bilateral carotid bruits. Cardiac: RRR; no rubs, or gallops,no edema . 2/6 systolic ejection murmur in the aortic area. Respiratory:  clear to auscultation bilaterally, normal work of breathing GI: soft, nontender, nondistended, + BS MS: no deformity or atrophy  Skin: warm and dry, no rash Neuro:  Strength and sensation are intact Psych: euthymic mood, full affect   EKG:  EKG is ordered today.  EKG showed normal sinus rhythm with possible left atrial enlargement and possible old septal infarct.   Recent Labs: No results found for requested labs within last 8760 hours.    Lipid Panel No results found for: CHOL, TRIG, HDL, CHOLHDL, VLDL, LDLCALC, LDLDIRECT    Wt Readings from Last 3 Encounters:  09/10/19 152 lb (68.9 kg)  03/19/19 157 lb (71.2 kg)  04/03/18 170 lb (77.1 kg)         ASSESSMENT AND PLAN:  1. Peripheral arterial disease status post successful surgical revascularization of bilateral lower extremities.   Status post  drug-coated balloon angioplasty to the left femoral-popliteal bypass.  I reviewed recent vascular studies with her which showed normal ABI bilaterally with normal velocities.  Continue medical therapy and repeat studies in 1 year.  2. Coronary artery disease: She has no chest pain. Stable exertional dyspnea.  3. Essential hypertension: Blood pressure improved significantly after switching metoprolol to carvedilol.  Still not optimally controlled.  She is not on an ACE inhibitor or ARB due to chronic  kidney disease with creatinine around 2.  If blood pressure remains elevated, we can consider adding hydralazine.  4.  Mixed hyperlipidemia: Currently on atorvastatin and fenofibrate with excellent control.  I reviewed her recent labs done with her primary care  physician.  5. Bilateral carotid stenosis: Recent carotid Doppler showed mild bilateral disease.  Repeat study in 1 year.    Disposition:   FU with me in 6 months  Signed,  [name], MD  09/10/2019 9:36 AM    [place]

## 2019-09-10 NOTE — Patient Instructions (Signed)

## 2019-10-26 ENCOUNTER — Other Ambulatory Visit: Payer: Self-pay | Admitting: Physician Assistant

## 2019-10-28 NOTE — Telephone Encounter (Signed)
Pt's pharmacy is requesting a refill on famotidine, would Dr. Fletcher Anon like to refill this medication? Please address

## 2020-03-06 ENCOUNTER — Other Ambulatory Visit: Payer: Self-pay | Admitting: Cardiovascular Disease

## 2020-03-10 ENCOUNTER — Other Ambulatory Visit: Payer: Self-pay

## 2020-03-10 ENCOUNTER — Encounter: Payer: Self-pay | Admitting: Cardiovascular Disease

## 2020-03-10 ENCOUNTER — Ambulatory Visit (INDEPENDENT_AMBULATORY_CARE_PROVIDER_SITE_OTHER): Payer: Medicare Other | Admitting: Cardiovascular Disease

## 2020-03-10 VITALS — BP 120/52 | HR 62 | Ht 65.0 in | Wt 143.0 lb

## 2020-03-10 DIAGNOSIS — I10 Essential (primary) hypertension: Secondary | ICD-10-CM

## 2020-03-10 DIAGNOSIS — I779 Disorder of arteries and arterioles, unspecified: Secondary | ICD-10-CM

## 2020-03-10 DIAGNOSIS — I739 Peripheral vascular disease, unspecified: Secondary | ICD-10-CM | POA: Diagnosis not present

## 2020-03-10 DIAGNOSIS — Z5181 Encounter for therapeutic drug level monitoring: Secondary | ICD-10-CM

## 2020-03-10 DIAGNOSIS — I6523 Occlusion and stenosis of bilateral carotid arteries: Secondary | ICD-10-CM

## 2020-03-10 DIAGNOSIS — I2510 Atherosclerotic heart disease of native coronary artery without angina pectoris: Secondary | ICD-10-CM

## 2020-03-10 DIAGNOSIS — E785 Hyperlipidemia, unspecified: Secondary | ICD-10-CM

## 2020-03-10 LAB — COMPREHENSIVE METABOLIC PANEL
ALT: 10 IU/L (ref 0–32)
AST: 19 IU/L (ref 0–40)
Albumin/Globulin Ratio: 1.7 (ref 1.2–2.2)
Albumin: 4.3 g/dL (ref 3.8–4.8)
Alkaline Phosphatase: 49 IU/L (ref 48–121)
BUN/Creatinine Ratio: 22 (ref 12–28)
BUN: 44 mg/dL — ABNORMAL HIGH (ref 8–27)
Bilirubin Total: 0.3 mg/dL (ref 0.0–1.2)
CO2: 23 mmol/L (ref 20–29)
Calcium: 9.9 mg/dL (ref 8.7–10.3)
Chloride: 100 mmol/L (ref 96–106)
Creatinine, Ser: 1.99 mg/dL — ABNORMAL HIGH (ref 0.57–1.00)
GFR calc Af Amer: 30 mL/min/{1.73_m2} — ABNORMAL LOW (ref >59)
GFR calc non Af Amer: 26 mL/min/{1.73_m2} — ABNORMAL LOW (ref >59)
Globulin, Total: 2.5 g/dL (ref 1.5–4.5)
Glucose: 257 mg/dL — ABNORMAL HIGH (ref 65–99)
Potassium: 4.2 mmol/L (ref 3.5–5.2)
Sodium: 139 mmol/L (ref 134–144)
Total Protein: 6.8 g/dL (ref 6.0–8.5)

## 2020-03-10 LAB — LIPID PANEL
Chol/HDL Ratio: 2.7 ratio (ref 0.0–4.4)
Cholesterol, Total: 125 mg/dL (ref 100–199)
HDL: 46 mg/dL (ref >39)
LDL Chol Calc (NIH): 54 mg/dL (ref 0–99)
Triglycerides: 144 mg/dL (ref 0–149)
VLDL Cholesterol Cal: 25 mg/dL (ref 5–40)

## 2020-03-10 NOTE — Progress Notes (Signed)
Cardiology Office Note   Date:  03/10/2020   ID:  [name], DOB [birth year], MRN [id number]  PCP:  Nicholes Rough, PA-C  Cardiologist:   Dr. Stanford Breed  No chief complaint on file.     History of Present Illness: [name] is a 64 y.o. female who presents for a followup visit regarding peripheral arterial disease. She has known history of  coronary artery status post coronary bypass graft surgery in 2004. She is known to have moderate bilateral carotid stenosis.  She also has prolonged history of diabetes with  chronic kidney disease and  diabetic neuropathy.  She is s/p left external iliac/common femoral and profunda endarterectomy with extended profundoplasty, ligation of the left superficial femoral artery and femoral to below the knee popliteal artery bypass with a vein graft in 05/18 and more recently right femoral to below-knee popliteal artery bypass in August, 2018.  Revascularization was performed for critical limb ischemia. She had GI bleed on small dose Xarelto which was started for peripheral arterial disease.  EGD was unremarkable.  She was taken off Xarelto.   She had a significant drop in ABI on the left side in 2019.  CO2 angiography in August 2019 showed patent left femoral to below the knee popliteal artery bypass with discrete 80% stenosis in the mid to distal segment.  This was treated successfully with drug-coated balloon angioplasty.   She has been doing well with no recent chest pain or worsening dyspnea.  No leg claudication although she does have occasional numbness in her feet.  She improved her lifestyle and continues to lose weight.  She is down 14 pounds since last year.  Past Medical History:  Diagnosis Date  . Adenomatous polyp of colon   . Anemia   . Arthritis   . Asthma   . Carotid artery disease ([place])   . Carpal tunnel syndrome   . Chronic renal insufficiency    Stage 3 Kidney disease  . Coronary artery disease   . Depression   . Depression  with anxiety   . Diabetes mellitus    type II  . Diverticulosis of colon (without mention of hemorrhage)   . GERD (gastroesophageal reflux disease)   . Heart murmur   . History of hiatal hernia   . Hyperlipidemia   . Hypertension   . Hypothyroidism   . Obesity   . PVD (peripheral vascular disease) ([place])   . Thyroid disease    hypo    Past Surgical History:  Procedure Laterality Date  . ABDOMINAL AORTAGRAM N/A 12/18/2013   Procedure: ABDOMINAL [name];  Surgeon: Wellington Hampshire, MD;  Location: [place] CATH LAB;  Service: Cardiovascular;  Laterality: N/A;  . ABDOMINAL AORTOGRAM W/LOWER EXTREMITY N/A 11/16/2016   Procedure: Abdominal Aortogram w/Lower Extremity;  Surgeon: Wellington Hampshire, MD;  Location: [place] CV LAB;  Service: Cardiovascular;  Laterality: N/A;  . ABDOMINAL AORTOGRAM W/LOWER EXTREMITY N/A 01/25/2017   Procedure: Abdominal Aortogram w/Lower Extremity;  Surgeon: Wellington Hampshire, MD;  Location: [place] CV LAB;  Service: Cardiovascular;  Laterality: N/A;  only completed Lower Extremity  . ABDOMINAL AORTOGRAM W/LOWER EXTREMITY N/A 02/28/2018   Procedure: ABDOMINAL AORTOGRAM W/LOWER EXTREMITY;  Surgeon: Wellington Hampshire, MD;  Location: [place] CV LAB;  Service: Cardiovascular;  Laterality: N/A;  . ABDOMINAL HYSTERECTOMY    . APPENDECTOMY    . APPLICATION OF WOUND VAC Right 02/13/2017   Procedure: APPLICATION OF INCISIONAL WOUND VAC RIGHT GROIN;  Surgeon: Ruta Hinds  E, MD;  Location: [place];  Service: Vascular;  Laterality: Right;  . CHOLECYSTECTOMY    . CORONARY ARTERY BYPASS GRAFT  2004   x4  . ENDARTERECTOMY FEMORAL Left 11/23/2016   Procedure: ENDARTERECTOMY OF LEFT EXTERNAL COMMON FEMORAL ARTERY WITH EXTENDED LEFT PROFUNDOPLASTY;  Surgeon: Elam Dutch, MD;  Location: [place];  Service: Vascular;  Laterality: Left;  . ENDARTERECTOMY FEMORAL Right 02/13/2017   Procedure: ENDARTERECTOMY RIGHT FEMORAL ARTERY WITH PROFUNDAPLASTY;  Surgeon: Elam Dutch,  MD;  Location: [facility] OR;  Service: Vascular;  Laterality: Right;  . ESOPHAGOGASTRODUODENOSCOPY N/A 08/02/2017   Procedure: ESOPHAGOGASTRODUODENOSCOPY (EGD);  Surgeon: Ladene Artist, MD;  Location[facility] ENDOSCOPY;  Service: Endoscopy;  Laterality: N/A;  . FEMORAL-POPLITEAL BYPASS GRAFT Left 11/23/2016   Procedure: LEFT FEMORAL-BELOW KNEE POPLITEAL ARTERY BYPASS;  Surgeon: Elam Dutch, MD;  Location: [place];  Service: Vascular;  Laterality: Left;  . FEMORAL-POPLITEAL BYPASS GRAFT Right 02/13/2017   Procedure: BYPASS GRAFT RIGHT FEMORAL-BELOW KNEE POPLITEAL ARTERY;  Surgeon: Elam Dutch, MD;  Location: [place];  Service: Vascular;  Laterality: Right;  . KNEE SURGERY Right 2013   arthroscopy  . PATCH ANGIOPLASTY Right 02/13/2017   Procedure: VEIN PATCH ANGIOPLASTY RIGHT POPLITEAL ARTERY AND HEMASHIELD PATCH ANGIOPLASTY OF RIGHT FEMORAL ARTERY;  Surgeon: Elam Dutch, MD;  Location: [place];  Service: Vascular;  Laterality: Right;  . PERIPHERAL VASCULAR BALLOON ANGIOPLASTY  02/28/2018   Procedure: PERIPHERAL VASCULAR BALLOON ANGIOPLASTY;  Surgeon: Wellington Hampshire, MD;  Location: [place] CV LAB;  Service: Cardiovascular;;  Left Fem-pop Bypass     Current Outpatient Medications  Medication Sig Dispense Refill  . albuterol (PROVENTIL HFA;VENTOLIN HFA) 108 (90 BASE) MCG/ACT inhaler Inhale 2 puffs into the lungs every 6 (six) hours as needed for wheezing or shortness of breath.     . ALPRAZolam (XANAX) 1 MG tablet Take 1 mg by mouth 2 (two) times daily as needed for anxiety.     Marland Kitchen amLODipine (NORVASC) 10 MG tablet Take 10 mg by mouth daily.    Marland Kitchen aspirin EC 81 MG tablet Take 81 mg by mouth at bedtime.    Marland Kitchen atorvastatin (LIPITOR) 80 MG tablet Take 80 mg by mouth daily.     . calcitRIOL (ROCALTROL) 0.25 MCG capsule Take 0.25 mcg by mouth every Monday, Wednesday, and Friday.   6  . carvedilol (COREG) 12.5 MG tablet TAKE 1 TABLET BY MOUTH TWICE A DAY 180 tablet 0  . cetirizine (ZYRTEC) 10 MG  tablet Take 10 mg by mouth daily.  6  . famotidine (PEPCID) 40 MG tablet TAKE 1 TABLET BY MOUTH EVERY DAY 90 tablet 1  . fenofibrate micronized (LOFIBRA) 200 MG capsule Take 200 mg by mouth at bedtime.     . ferrous sulfate 325 (65 FE) MG tablet Take 325 mg by mouth 3 (three) times daily.  3  . furosemide (LASIX) 40 MG tablet Take 40 mg by mouth daily.     . Insulin Glargine (BASAGLAR KWIKPEN) 100 UNIT/ML SOPN Inject 20-60 Units into the skin 2 (two) times daily before a meal. Per sliding scale  3  . levothyroxine (SYNTHROID) 125 MCG tablet Take 1 tablet daily in the morning. Take on an empty stomach.    . Omega-3 Fatty Acids (FISH OIL) 1000 MG CAPS Take 1,000 mg by mouth 2 (two) times daily.    Marland Kitchen PARoxetine (PAXIL) 40 MG tablet Take 60 mg by mouth at bedtime.     . prednisoLONE acetate (PRED FORTE)  1 % ophthalmic suspension Place 1 drop into the right eye 2 (two) times daily.    . promethazine (PHENERGAN) 25 MG tablet Take 25 mg by mouth daily as needed for nausea or vomiting.   6  . Vitamin D, Ergocalciferol, (DRISDOL) 50000 UNITS CAPS capsule Take 50,000 Units by mouth 2 (two) times a week. Takes on Mondays and Fridays  1   No current facility-administered medications for this visit.    Allergies:   Patient has no known allergies.    Social History:  The patient  reports that she quit smoking about 3 years ago. Her smoking use included cigarettes. She has a 30.00 pack-year smoking history. She has never used smokeless tobacco. She reports that she does not drink alcohol and does not use drugs.   Family History:  The patient's family history includes Colon polyps in her daughter; Diabetes in her mother; Heart disease in her father.    ROS:  Please see the history of present illness.   Otherwise, review of systems are positive for none.   All other systems are reviewed and negative.    PHYSICAL EXAM: VS:  BP (!) 120/52   Pulse 62   Ht 5\' 5"  (1.651 m)   Wt 143 lb (64.9 kg)   SpO2 98%    BMI 23.80 kg/m  , BMI Body mass index is 23.8 kg/m. GEN: Well nourished, well developed, in no acute distress  HEENT: normal  Neck: no JVD,  or masses. Bilateral carotid bruits. Cardiac: RRR; no rubs, or gallops,no edema . 2/6 systolic ejection murmur in the aortic area. Respiratory:  clear to auscultation bilaterally, normal work of breathing GI: soft, nontender, nondistended, + BS MS: no deformity or atrophy  Skin: warm and dry, no rash Neuro:  Strength and sensation are intact Psych: euthymic mood, full affect Dorsalis pedis is palpable bilaterally  EKG:  EKG is not ordered today.  EKG showed normal sinus rhythm with possible left atrial enlargement and possible old septal infarct.   Recent Labs: No results found for requested labs within last 8760 hours.    Lipid Panel No results found for: CHOL, TRIG, HDL, CHOLHDL, VLDL, LDLCALC, LDLDIRECT    Wt Readings from Last 3 Encounters:  03/10/20 143 lb (64.9 kg)  09/10/19 152 lb (68.9 kg)  03/19/19 157 lb (71.2 kg)         ASSESSMENT AND PLAN:  1. Peripheral arterial disease status post successful surgical revascularization of bilateral lower extremities.   Status post  drug-coated balloon angioplasty to the left femoral-popliteal bypass.  She has palpable dorsalis pedis bilaterally.  Repeat Doppler studies in February next year  2. Coronary artery disease: She has no chest pain. Stable exertional dyspnea.  3. Essential hypertension: Blood pressure is controlled on current medications.  Check basic metabolic profile  4.  Mixed hyperlipidemia: Currently on atorvastatin and fenofibrate .  I requested lipid and liver profile today.  5. Bilateral carotid stenosis: Recent carotid Doppler showed mild bilateral disease.  Repeat study in February 2022  7.  Cardiac murmur suggestive of mild aortic stenosis: We will plan echocardiogram next year.   Disposition:   FU with me in 6 months  Signed,  [name], MD   03/10/2020 11:00 AM    [place]

## 2020-03-10 NOTE — Patient Instructions (Signed)
Medication Instructions:  Your physician recommends that you continue on your current medications as directed. Please refer to the Current Medication list given to you today.  *If you need a refill on your cardiac medications before your next appointment, please call your pharmacy*  Lab Work: LP/CMET TODAY   If you have labs (blood work) drawn today and your tests are completely normal, you will receive your results only by: Marland Kitchen MyChart Message (if you have MyChart) OR . A paper copy in the mail If you have any lab test that is abnormal or we need to change your treatment, we will call you to review the results.   Testing/Procedures: NONE  Follow-Up: At [facility], you and your health needs are our priority.  As part of our continuing mission to provide you with exceptional heart care, we have created designated Provider Care Teams.  These Care Teams include your primary Cardiologist (physician) and Advanced Practice Providers (APPs -  Physician Assistants and Nurse Practitioners) who all work together to provide you with the care you need, when you need it.  We recommend signing up for the patient portal called "MyChart".  Sign up information is provided on this After Visit Summary.  MyChart is used to connect with patients for Virtual Visits (Telemedicine).  Patients are able to view lab/test results, encounter notes, upcoming appointments, etc.  Non-urgent messages can be sent to your provider as well.   To learn more about what you can do with MyChart, go to [email or web address].    Your next appointment:   6 month(s)  The format for your next appointment:   In Person  Provider:   You may see DR Fletcher Anon or one of the following Advanced Practice Providers on your designated Care Team:    Kerin Ransom, PA-C  [place], [place]  Coletta Memos, [place]

## 2020-03-13 ENCOUNTER — Encounter: Payer: Self-pay | Admitting: *Deleted

## 2020-05-01 ENCOUNTER — Other Ambulatory Visit: Payer: Self-pay | Admitting: Cardiovascular Disease

## 2020-05-01 NOTE — Telephone Encounter (Signed)
Refill Request.  

## 2020-05-29 ENCOUNTER — Other Ambulatory Visit: Payer: Self-pay | Admitting: Cardiovascular Disease

## 2020-05-29 NOTE — Telephone Encounter (Signed)
Refill Request.  

## 2020-06-15 ENCOUNTER — Other Ambulatory Visit: Payer: Self-pay | Admitting: Cardiovascular Disease

## 2020-06-15 NOTE — Telephone Encounter (Signed)
Refill Request.  

## 2020-07-14 ENCOUNTER — Other Ambulatory Visit: Payer: Self-pay | Admitting: Physician Assistant

## 2020-07-14 DIAGNOSIS — Z1231 Encounter for screening mammogram for malignant neoplasm of breast: Secondary | ICD-10-CM

## 2020-08-14 ENCOUNTER — Encounter: Payer: Self-pay | Admitting: Emergency Medicine

## 2020-08-14 ENCOUNTER — Ambulatory Visit (INDEPENDENT_AMBULATORY_CARE_PROVIDER_SITE_OTHER): Payer: Medicare Other

## 2020-08-14 ENCOUNTER — Ambulatory Visit
Admission: EM | Admit: 2020-08-14 | Discharge: 2020-08-14 | Disposition: A | Discharge status: Home / Self Care | Payer: Medicare Other | Attending: Family Medicine | Admitting: Family Medicine

## 2020-08-14 ENCOUNTER — Other Ambulatory Visit: Payer: Self-pay

## 2020-08-14 DIAGNOSIS — S93401A Sprain of unspecified ligament of right ankle, initial encounter: Secondary | ICD-10-CM

## 2020-08-14 DIAGNOSIS — M25571 Pain in right ankle and joints of right foot: Secondary | ICD-10-CM

## 2020-08-14 DIAGNOSIS — W19XXXA Unspecified fall, initial encounter: Secondary | ICD-10-CM

## 2020-08-14 MED ORDER — ACETAMINOPHEN-CODEINE #3 300-30 MG PO TABS: 1.0000 | ORAL_TABLET | Freq: Four times a day (QID) | ORAL | 0 refills | Status: DC | PRN

## 2020-08-14 MED ORDER — TIZANIDINE HCL 4 MG PO TABS: 4.0000 mg | ORAL_TABLET | Freq: Four times a day (QID) | ORAL | 0 refills | Status: DC | PRN

## 2020-08-14 NOTE — ED Provider Notes (Signed)
[place]   [id number] 08/14/20 Arrival Time: 1022  KX:FGHWE PAIN  SUBJECTIVE: History from: patient and family. [name] is a 65 y.o. female complains of right ankle pain that began one week ago when she fell at home after tripping over a rug. Describes the pain as intermittent and achy in character. Has tried OTC medications and lidocaine patches with some relief. Symptoms are made worse with activity. Denies similar symptoms in the past. Denies fever, chills, erythema, ecchymosis, effusion, weakness, numbness and tingling, saddle paresthesias, loss of bowel or bladder function.      ROS: As per HPI.  All other pertinent ROS negative.     Past Medical History:  Diagnosis Date  . Adenomatous polyp of colon   . Anemia   . Arthritis   . Asthma   . Carotid artery disease ([place])   . Carpal tunnel syndrome   . Chronic renal insufficiency    Stage 3 Kidney disease  . Coronary artery disease   . Depression   . Depression with anxiety   . Diabetes mellitus    type II  . Diverticulosis of colon (without mention of hemorrhage)   . GERD (gastroesophageal reflux disease)   . Heart murmur   . History of hiatal hernia   . Hyperlipidemia   . Hypertension   . Hypothyroidism   . Obesity   . PVD (peripheral vascular disease) ([place])   . Thyroid disease    hypo   Past Surgical History:  Procedure Laterality Date  . ABDOMINAL AORTAGRAM N/A 12/18/2013   Procedure: ABDOMINAL [name];  Surgeon: Wellington Hampshire, MD;  Location: [facility] CATH [facility];  Service: Cardiovascular;  Laterality: N/A;  . ABDOMINAL AORTOGRAM W/LOWER EXTREMITY N/A 11/16/2016   Procedure: Abdominal Aortogram w/Lower Extremity;  Surgeon: Wellington Hampshire, MD;  Location: [facility] CV [facility];  Service: Cardiovascular;  Laterality: N/A;  . ABDOMINAL AORTOGRAM W/LOWER EXTREMITY N/A 01/25/2017   Procedure: Abdominal Aortogram w/Lower Extremity;  Surgeon: Wellington Hampshire, MD;  Location: [facility] CV [facility];  Service:  Cardiovascular;  Laterality: N/A;  only completed Lower Extremity  . ABDOMINAL AORTOGRAM W/LOWER EXTREMITY N/A 02/28/2018   Procedure: ABDOMINAL AORTOGRAM W/LOWER EXTREMITY;  Surgeon: Wellington Hampshire, MD;  Location: [facility] CV LAB;  Service: Cardiovascular;  Laterality: N/A;  . ABDOMINAL HYSTERECTOMY    . APPENDECTOMY    . APPLICATION OF WOUND VAC Right 02/13/2017   Procedure: APPLICATION OF INCISIONAL WOUND VAC RIGHT GROIN;  Surgeon: Elam Dutch, MD;  Location: [facility];  Service: Vascular;  Laterality: Right;  . CHOLECYSTECTOMY    . CORONARY ARTERY BYPASS GRAFT  2004   x4  . ENDARTERECTOMY FEMORAL Left 11/23/2016   Procedure: ENDARTERECTOMY OF LEFT EXTERNAL COMMON FEMORAL ARTERY WITH EXTENDED LEFT PROFUNDOPLASTY;  Surgeon: Elam Dutch, MD;  Location: [facility];  Service: Vascular;  Laterality: Left;  . ENDARTERECTOMY FEMORAL Right 02/13/2017   Procedure: ENDARTERECTOMY RIGHT FEMORAL ARTERY WITH PROFUNDAPLASTY;  Surgeon: Elam Dutch, MD;  Location: [facility] OR;  Service: Vascular;  Laterality: Right;  . ESOPHAGOGASTRODUODENOSCOPY N/A 08/02/2017   Procedure: ESOPHAGOGASTRODUODENOSCOPY (EGD);  Surgeon: Ladene Artist, MD;  Location: [facility] ENDOSCOPY;  Service: Endoscopy;  Laterality: N/A;  . FEMORAL-POPLITEAL BYPASS GRAFT Left 11/23/2016   Procedure: LEFT FEMORAL-BELOW KNEE POPLITEAL ARTERY BYPASS;  Surgeon: Elam Dutch, MD;  Location: [facility];  Service: Vascular;  Laterality: Left;  . FEMORAL-POPLITEAL BYPASS GRAFT Right 02/13/2017   Procedure: BYPASS GRAFT RIGHT FEMORAL-BELOW KNEE POPLITEAL ARTERY;  Surgeon: Oneida Alar,  Jessy Oto, MD;  Location: [facility];  Service: Vascular;  Laterality: Right;  . KNEE SURGERY Right 2013   arthroscopy  . PATCH ANGIOPLASTY Right 02/13/2017   Procedure: VEIN PATCH ANGIOPLASTY RIGHT POPLITEAL ARTERY AND HEMASHIELD PATCH ANGIOPLASTY OF RIGHT FEMORAL ARTERY;  Surgeon: Elam Dutch, MD;  Location: [facility];  Service: Vascular;  Laterality: Right;  . PERIPHERAL  VASCULAR BALLOON ANGIOPLASTY  02/28/2018   Procedure: PERIPHERAL VASCULAR BALLOON ANGIOPLASTY;  Surgeon: Wellington Hampshire, MD;  Location: [facility] CV LAB;  Service: Cardiovascular;;  Left Fem-pop Bypass   No Known Allergies No current facility-administered medications on file prior to encounter.   Current Outpatient Medications on File Prior to Encounter  Medication Sig Dispense Refill  . albuterol (PROVENTIL HFA;VENTOLIN HFA) 108 (90 BASE) MCG/ACT inhaler Inhale 2 puffs into the lungs every 6 (six) hours as needed for wheezing or shortness of breath.     . ALPRAZolam (XANAX) 1 MG tablet Take 1 mg by mouth 2 (two) times daily as needed for anxiety.     Marland Kitchen amLODipine (NORVASC) 10 MG tablet Take 10 mg by mouth daily.    Marland Kitchen aspirin EC 81 MG tablet Take 81 mg by mouth at bedtime.    Marland Kitchen atorvastatin (LIPITOR) 80 MG tablet Take 80 mg by mouth daily.     . calcitRIOL (ROCALTROL) 0.25 MCG capsule Take 0.25 mcg by mouth every Monday, Wednesday, and Friday.   6  . carvedilol (COREG) 12.5 MG tablet TAKE 1 TABLET BY MOUTH TWICE A DAY 180 tablet 0  . cetirizine (ZYRTEC) 10 MG tablet Take 10 mg by mouth daily.  6  . famotidine (PEPCID) 40 MG tablet TAKE 1 TABLET BY MOUTH EVERY DAY 90 tablet 1  . fenofibrate micronized (LOFIBRA) 200 MG capsule Take 200 mg by mouth at bedtime.     . ferrous sulfate 325 (65 FE) MG tablet Take 325 mg by mouth 3 (three) times daily.  3  . furosemide (LASIX) 40 MG tablet Take 40 mg by mouth daily.     . Insulin Glargine (BASAGLAR KWIKPEN) 100 UNIT/ML SOPN Inject 20-60 Units into the skin 2 (two) times daily before a meal. Per sliding scale  3  . levothyroxine (SYNTHROID) 125 MCG tablet Take 1 tablet daily in the morning. Take on an empty stomach.    . Omega-3 Fatty Acids (FISH OIL) 1000 MG CAPS Take 1,000 mg by mouth 2 (two) times daily.    Marland Kitchen PARoxetine (PAXIL) 40 MG tablet Take 60 mg by mouth at bedtime.     . prednisoLONE acetate (PRED FORTE) 1 % ophthalmic suspension Place 1  drop into the right eye 2 (two) times daily.    . promethazine (PHENERGAN) 25 MG tablet Take 25 mg by mouth daily as needed for nausea or vomiting.   6  . Vitamin D, Ergocalciferol, (DRISDOL) 50000 UNITS CAPS capsule Take 50,000 Units by mouth 2 (two) times a week. Takes on Mondays and Fridays  1   Social History   Socioeconomic History  . Marital status: Widowed    Spouse name: Not on file  . Number of children: 2  . Years of education: Not on file  . Highest education level: Not on file  Occupational History  . 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 Smoking status: Former Smoker    Packs/day: 1.00    Years: 30.00    Pack years: 30.00  Types: Cigarettes    Quit date: 2018    Years since quitting: 4.0  . Smokeless tobacco: Never Used  Vaping Use  . Vaping Use: Never used  Substance and Sexual Activity  . Alcohol use: No    Alcohol/week: 0.0 standard drinks  . Drug use: No  . Sexual activity: Not on file  Other Topics Concern  . Not on file  Social History Narrative   Daily Caffeine   Social Determinants of Health   Financial Resource Strain: Not on file  Food Insecurity: Not on file  Transportation Needs: Not on file  Physical Activity: Not on file  Stress: Not on file  Social Connections: Not on file  Intimate Partner Violence: Not on file   Family History  Problem Relation Age of Onset  . Diabetes Mother   . Heart disease Father   . Colon polyps Daughter   . Colon cancer Neg Hx   . Esophageal cancer Neg Hx   . Stomach cancer Neg Hx   . Rectal cancer Neg Hx     OBJECTIVE:  Vitals:   08/14/20 1052  BP: 119/73  Pulse: 80  Resp: 18  Temp: 98.4 F (36.9 C)  TempSrc: Oral  SpO2: 97%    General appearance: ALERT; in no acute distress.  Head: NCAT Lungs: Normal respiratory effort CV: pulses 2+ bilaterally. Cap refill < 2 seconds Musculoskeletal:  Inspection: Skin warm, dry, clear and  intact No erythema, effusion to R ankle Palpation: dorsal surface of R foot tender to palpation ROM: Limited ROM active and passive to R ankle Skin: warm and dry Neurologic: Ambulates without difficulty; Sensation intact about the upper/ lower extremities Psychological: alert and cooperative; normal mood and affect  DIAGNOSTIC STUDIES:  DG Ankle Complete Right  Result Date: 08/14/2020 CLINICAL DATA:  Recent fall.  Pain EXAM: RIGHT ANKLE - COMPLETE 3+ VIEW COMPARISON:  None. FINDINGS: Normal alignment no fracture. Mild degenerative change in the midfoot. Mild calcaneal spurring. Arterial calcification. IMPRESSION: Negative for fracture. Electronically Signed   By: Franchot Gallo M.D.   On: 08/14/2020 11:12     ASSESSMENT & PLAN:  1. Acute right ankle pain   2. Fall, initial encounter   3. Sprain of right ankle, unspecified ligament, initial encounter     Meds ordered this encounter  Medications  . acetaminophen-codeine (TYLENOL #3) 300-30 MG tablet    Sig: Take 1-2 tablets by mouth every 6 (six) hours as needed for moderate pain.    Dispense:  15 tablet    Refill:  0    Order Specific Question:   Supervising Provider    Answer:   Chase Picket [id number]  . tiZANidine (ZANAFLEX) 4 MG tablet    Sig: Take 1 tablet (4 mg total) by mouth every 6 (six) hours as needed for muscle spasms.    Dispense:  30 tablet    Refill:  0    Order Specific Question:   Supervising Provider    Answer:   Chase Picket [id number]   Xrays negative today for fractures or dislocations Continue wearing air cast as needed Continue conservative management of rest, ice, and gentle stretches Take tylenol 3 as prescribed for pain relief  Take tizanidine at nighttime for symptomatic relief. Avoid driving or operating heavy machinery while using medication. Follow up with PCP if symptoms persist Return or go to the ER if you have any new or worsening symptoms (fever, chills, chest pain, abdominal pain,  changes in bowel or bladder  habits, pain radiating into lower legs)    Controlled Substances Registry consulted for this patient. I feel the risk/benefit ratio today is favorable for proceeding with this prescription for a controlled substance. Medication sedation precautions given.  Reviewed expectations re: course of current medical issues. Questions answered. Outlined signs and symptoms indicating need for more acute intervention. Patient verbalized understanding. After Visit Summary given.       Faustino Congress, NP 08/15/20 0830

## 2020-08-14 NOTE — ED Triage Notes (Signed)
RT ankle pain after tripping and falling x 1 week ago

## 2020-08-14 NOTE — Discharge Instructions (Addendum)
Take ibuprofen as needed.  Rest and elevate your ankle  Apply ice packs 2-3 times a day for up to 20 minutes each.  Wear the brace as needed for comfort.    Prescribed Tylenol 3 for you to take 1 tablet every 6 hours as needed for moderate pain  Prescribed tizanidine for you to take 1 tablet every 6 hours as needed for muscle spasms  Follow up with your primary care provider or an orthopedist if you symptoms continue or worsen;  Or if you develop new symptoms, such as numbness, tingling, or weakness.

## 2020-08-20 ENCOUNTER — Ambulatory Visit: Payer: Medicare Other

## 2020-08-25 ENCOUNTER — Telehealth: Payer: Self-pay | Admitting: Cardiovascular Disease

## 2020-08-25 NOTE — Telephone Encounter (Signed)
Call placed to the patient and her daughter. She stated that the patient fell on 2/4 and hurt her foot. She saw the orthopedist because her right foot has been getting worse. She has had two X-rays and both were normal, showing no breaks.   She stated that the patient's foot has gradually been getting worse. Her foot is now cold to the touch and painful both at rest and when she ambulates. She also stated that there is a light red streak on her shin and ankle. She denies any open wounds and fever.   Appointment made for 2/16 for a LEA/ABI. The patient has been advised to proceed to the ED if her foot worsens and/or she develops a fever and loss of sensation or pulse.

## 2020-08-25 NOTE — Telephone Encounter (Signed)
Patient daughter calling for urgent appt to eval R Foot  pain and swelling with some irritation and glossy skin ( tightness ) red on foot and toes are bluish purple and cold to touch denies hardness but seeing streaking up leg.   Sob when ambulating attributing to pain from injury.    Patient seen by ortho and does not have a fx was told to see Fletcher Anon asap or go to ed   Next available Friday. Please advise.

## 2020-08-25 NOTE — Telephone Encounter (Signed)
Spoke with Lattie Haw / Dr. Fletcher Anon . Patient daughter aware Lattie Haw from [place] office will call to discuss scheduling ultrasounds.

## 2020-08-25 NOTE — Telephone Encounter (Signed)
Okay; thanks.

## 2020-08-26 ENCOUNTER — Telehealth: Payer: Self-pay | Admitting: Cardiovascular Disease

## 2020-08-26 ENCOUNTER — Encounter (HOSPITAL_COMMUNITY): Payer: Self-pay | Admitting: Emergency Medicine

## 2020-08-26 ENCOUNTER — Other Ambulatory Visit: Payer: Self-pay

## 2020-08-26 ENCOUNTER — Inpatient Hospital Stay (HOSPITAL_COMMUNITY)
Admission: EM | Admit: 2020-08-26 | Discharge: 2020-08-29 | DRG: 253 | Disposition: A | Discharge status: Home / Self Care | Payer: Medicare Other | Attending: Vascular Surgery | Admitting: Vascular Surgery

## 2020-08-26 ENCOUNTER — Ambulatory Visit (HOSPITAL_BASED_OUTPATIENT_CLINIC_OR_DEPARTMENT_OTHER)
Admission: RE | Admit: 2020-08-26 | Discharge: 2020-08-26 | Disposition: A | Discharge status: Home / Self Care | Payer: Medicare Other | Source: Ambulatory Visit | Attending: Cardiovascular Disease | Admitting: Cardiovascular Disease

## 2020-08-26 DIAGNOSIS — E1122 Type 2 diabetes mellitus with diabetic chronic kidney disease: Secondary | ICD-10-CM | POA: Diagnosis present

## 2020-08-26 DIAGNOSIS — T82858A Stenosis of vascular prosthetic devices, implants and grafts, initial encounter: Secondary | ICD-10-CM

## 2020-08-26 DIAGNOSIS — Z87891 Personal history of nicotine dependence: Secondary | ICD-10-CM

## 2020-08-26 DIAGNOSIS — Z7982 Long term (current) use of aspirin: Secondary | ICD-10-CM

## 2020-08-26 DIAGNOSIS — Z951 Presence of aortocoronary bypass graft: Secondary | ICD-10-CM

## 2020-08-26 DIAGNOSIS — E1142 Type 2 diabetes mellitus with diabetic polyneuropathy: Secondary | ICD-10-CM | POA: Diagnosis present

## 2020-08-26 DIAGNOSIS — Y832 Surgical operation with anastomosis, bypass or graft as the cause of abnormal reaction of the patient, or of later complication, without mention of misadventure at the time of the procedure: Secondary | ICD-10-CM | POA: Diagnosis present

## 2020-08-26 DIAGNOSIS — E7800 Pure hypercholesterolemia, unspecified: Secondary | ICD-10-CM | POA: Diagnosis present

## 2020-08-26 DIAGNOSIS — E871 Hypo-osmolality and hyponatremia: Secondary | ICD-10-CM | POA: Diagnosis present

## 2020-08-26 DIAGNOSIS — E039 Hypothyroidism, unspecified: Secondary | ICD-10-CM | POA: Diagnosis present

## 2020-08-26 DIAGNOSIS — I70221 Atherosclerosis of native arteries of extremities with rest pain, right leg: Secondary | ICD-10-CM

## 2020-08-26 DIAGNOSIS — Z79899 Other long term (current) drug therapy: Secondary | ICD-10-CM

## 2020-08-26 DIAGNOSIS — Z8249 Family history of ischemic heart disease and other diseases of the circulatory system: Secondary | ICD-10-CM

## 2020-08-26 DIAGNOSIS — Z794 Long term (current) use of insulin: Secondary | ICD-10-CM

## 2020-08-26 DIAGNOSIS — Z20822 Contact with and (suspected) exposure to covid-19: Secondary | ICD-10-CM | POA: Diagnosis present

## 2020-08-26 DIAGNOSIS — Z79890 Hormone replacement therapy (postmenopausal): Secondary | ICD-10-CM

## 2020-08-26 DIAGNOSIS — Z86010 Personal history of colonic polyps: Secondary | ICD-10-CM

## 2020-08-26 DIAGNOSIS — I739 Peripheral vascular disease, unspecified: Secondary | ICD-10-CM

## 2020-08-26 DIAGNOSIS — I6523 Occlusion and stenosis of bilateral carotid arteries: Secondary | ICD-10-CM | POA: Diagnosis present

## 2020-08-26 DIAGNOSIS — Z833 Family history of diabetes mellitus: Secondary | ICD-10-CM

## 2020-08-26 DIAGNOSIS — I129 Hypertensive chronic kidney disease with stage 1 through stage 4 chronic kidney disease, or unspecified chronic kidney disease: Secondary | ICD-10-CM | POA: Diagnosis present

## 2020-08-26 DIAGNOSIS — Z8371 Family history of colonic polyps: Secondary | ICD-10-CM

## 2020-08-26 DIAGNOSIS — T82868A Thrombosis of vascular prosthetic devices, implants and grafts, initial encounter: Secondary | ICD-10-CM | POA: Diagnosis not present

## 2020-08-26 DIAGNOSIS — I998 Other disorder of circulatory system: Secondary | ICD-10-CM | POA: Diagnosis not present

## 2020-08-26 DIAGNOSIS — E785 Hyperlipidemia, unspecified: Secondary | ICD-10-CM | POA: Diagnosis present

## 2020-08-26 DIAGNOSIS — S93401D Sprain of unspecified ligament of right ankle, subsequent encounter: Secondary | ICD-10-CM

## 2020-08-26 DIAGNOSIS — I70229 Atherosclerosis of native arteries of extremities with rest pain, unspecified extremity: Secondary | ICD-10-CM

## 2020-08-26 DIAGNOSIS — K219 Gastro-esophageal reflux disease without esophagitis: Secondary | ICD-10-CM | POA: Diagnosis present

## 2020-08-26 DIAGNOSIS — Z9049 Acquired absence of other specified parts of digestive tract: Secondary | ICD-10-CM

## 2020-08-26 DIAGNOSIS — Z90710 Acquired absence of both cervix and uterus: Secondary | ICD-10-CM

## 2020-08-26 DIAGNOSIS — N184 Chronic kidney disease, stage 4 (severe): Secondary | ICD-10-CM | POA: Diagnosis present

## 2020-08-26 DIAGNOSIS — N179 Acute kidney failure, unspecified: Secondary | ICD-10-CM | POA: Diagnosis present

## 2020-08-26 DIAGNOSIS — I2510 Atherosclerotic heart disease of native coronary artery without angina pectoris: Secondary | ICD-10-CM | POA: Diagnosis present

## 2020-08-26 DIAGNOSIS — E1151 Type 2 diabetes mellitus with diabetic peripheral angiopathy without gangrene: Secondary | ICD-10-CM | POA: Diagnosis present

## 2020-08-26 DIAGNOSIS — W19XXXD Unspecified fall, subsequent encounter: Secondary | ICD-10-CM | POA: Diagnosis present

## 2020-08-26 DIAGNOSIS — E1165 Type 2 diabetes mellitus with hyperglycemia: Secondary | ICD-10-CM | POA: Diagnosis present

## 2020-08-26 LAB — COMPREHENSIVE METABOLIC PANEL
ALT: 11 U/L (ref 0–44)
AST: 15 U/L (ref 15–41)
Albumin: 2.7 g/dL — ABNORMAL LOW (ref 3.5–5.0)
Alkaline Phosphatase: 71 U/L (ref 38–126)
Anion gap: 13 (ref 5–15)
BUN: 39 mg/dL — ABNORMAL HIGH (ref 8–23)
CO2: 24 mmol/L (ref 22–32)
Calcium: 9.3 mg/dL (ref 8.9–10.3)
Chloride: 90 mmol/L — ABNORMAL LOW (ref 98–111)
Creatinine, Ser: 2.3 mg/dL — ABNORMAL HIGH (ref 0.44–1.00)
GFR, Estimated: 23 mL/min — ABNORMAL LOW (ref >60)
Glucose, Bld: 409 mg/dL — ABNORMAL HIGH (ref 70–99)
Potassium: 4.7 mmol/L (ref 3.5–5.1)
Sodium: 127 mmol/L — ABNORMAL LOW (ref 135–145)
Total Bilirubin: 0.8 mg/dL (ref 0.3–1.2)
Total Protein: 6.7 g/dL (ref 6.5–8.1)

## 2020-08-26 LAB — CBC WITH DIFFERENTIAL/PLATELET
Abs Immature Granulocytes: 0.08 K/uL — ABNORMAL HIGH (ref 0.00–0.07)
Basophils Absolute: 0 K/uL (ref 0.0–0.1)
Basophils Relative: 0 %
Eosinophils Absolute: 0.4 K/uL (ref 0.0–0.5)
Eosinophils Relative: 3 %
HCT: 36.5 % (ref 36.0–46.0)
Hemoglobin: 12 g/dL (ref 12.0–15.0)
Immature Granulocytes: 1 %
Lymphocytes Relative: 12 %
Lymphs Abs: 1.5 K/uL (ref 0.7–4.0)
MCH: 29.4 pg (ref 26.0–34.0)
MCHC: 32.9 g/dL (ref 30.0–36.0)
MCV: 89.5 fL (ref 80.0–100.0)
Monocytes Absolute: 1 K/uL (ref 0.1–1.0)
Monocytes Relative: 7 %
Neutro Abs: 9.8 K/uL — ABNORMAL HIGH (ref 1.7–7.7)
Neutrophils Relative %: 77 %
Platelets: 308 K/uL (ref 150–400)
RBC: 4.08 MIL/uL (ref 3.87–5.11)
RDW: 13 % (ref 11.5–15.5)
WBC: 12.8 K/uL — ABNORMAL HIGH (ref 4.0–10.5)
nRBC: 0 % (ref 0.0–0.2)

## 2020-08-26 MED ORDER — HEPARIN (PORCINE) 25000 UT/250ML-% IV SOLN
1000.0000 [IU]/h | INTRAVENOUS | Status: DC
  Administered 2020-08-26: 22:00:00 1000 [IU]/h via INTRAVENOUS
  Filled 2020-08-26 (×2): qty 250

## 2020-08-26 MED ORDER — HEPARIN BOLUS VIA INFUSION
4000.0000 [IU] | Freq: Once | INTRAVENOUS | Status: AC
  Administered 2020-08-26: 4000 [IU] via INTRAVENOUS
  Filled 2020-08-26: qty 4000

## 2020-08-26 MED ORDER — FENTANYL CITRATE (PF) 100 MCG/2ML IJ SOLN
50.0000 ug | Freq: Once | INTRAMUSCULAR | Status: AC
  Administered 2020-08-26: 50 ug via INTRAVENOUS
  Filled 2020-08-26: qty 2

## 2020-08-26 MED ORDER — ALPRAZOLAM 0.25 MG PO TABS: 1.0000 mg | ORAL_TABLET | Freq: Two times a day (BID) | ORAL | Status: DC | PRN

## 2020-08-26 MED ORDER — ONDANSETRON HCL 4 MG/2ML IJ SOLN
4.0000 mg | Freq: Once | INTRAMUSCULAR | Status: AC
  Administered 2020-08-26: 4 mg via INTRAVENOUS
  Filled 2020-08-26: qty 2

## 2020-08-26 NOTE — Progress Notes (Signed)
ANTICOAGULATION CONSULT NOTE - Initial Consult  Pharmacy Consult for heparin Indication: R ischemic leg  No Known Allergies  Patient Measurements: Height: 5\' 4"  (162.6 cm) Weight: 58.8 kg (129 lb 9.6 oz) IBW/kg (Calculated) : 54.7 Heparin Dosing Weight: 58.8  Vital Signs: Temp: 97.5 F (36.4 C) (02/16 1835) Temp Source: Oral (02/16 1835) BP: 176/69 (02/16 2010) Pulse Rate: 73 (02/16 2010)  Labs: Recent Labs    08/26/20 1852  HGB 12.0  HCT 36.5  PLT 308  CREATININE 2.30*    Estimated Creatinine Clearance: 21.1 mL/min (A) (by C-G formula based on SCr of 2.3 mg/dL (H)).   Medical History: Past Medical History:  Diagnosis Date  . Adenomatous polyp of colon   . Anemia   . Arthritis   . Asthma   . Carotid artery disease ([place])   . Carpal tunnel syndrome   . Chronic renal insufficiency    Stage 3 Kidney disease  . Coronary artery disease   . Depression   . Depression with anxiety   . Diabetes mellitus    type II  . Diverticulosis of colon (without mention of hemorrhage)   . GERD (gastroesophageal reflux disease)   . Heart murmur   . History of hiatal hernia   . Hyperlipidemia   . Hypertension   . Hypothyroidism   . Obesity   . PVD (peripheral vascular disease) ([place])   . Thyroid disease    hypo    Medications:  Scheduled:  . heparin  4,000 Units Intravenous Once    Assessment: 65 yo admitted after abnormal ABI study.  Plan for angiogram tomorrow (2/17).  No AC noted PTA.  CBC stable.  Pharmacy consulted to dose heparin.   Goal of Therapy:  Heparin level 0.3-0.7 units/ml Monitor platelets by anticoagulation protocol: Yes   Plan:  Heparin 4000 units bolus x1 Start heparin drip at 1000 units/hr F/u post-angiogram for heparin restart Will not enter in HL at this time due to procedure tomorrow (unknown time) Monitor CBC, s/sx bleeding  Dimple Nanas, PharmD PGY-1 Acute Care Pharmacy Resident Office: [phone] 08/26/2020 8:45 PM

## 2020-08-26 NOTE — Consult Note (Signed)
[facility] [place]  Patient name: [name] MRN: [id number] DOB: 05/07/1956 Sex: female   REQUESTING PROVIDER:    Dr Audelia Acton   REASON FOR CONSULT:    Occluded bypass graft  HISTORY OF PRESENT ILLNESS:   [name] is a 65 y.o. female, who is status post left femoral endarterectomy and femoral below knee popliteal bypass with non-reversed vein on 11-23-2016 for a foot wound, and right femoral endarterectomy and dacron patch angioplasty and femoral to below knee bypass with PTFE and vein patch to the popliteal artery for a right foot wound.  These were performed by Dr Oneida Alar.  He last saw her in 2019  At that time, she had a palpable left DP pulse and a palpable right PT pulse. On duplex imaging, she was found to have a mid graft stenosis in her left leg bypass which was treated by Dr. Fletcher Anon with [facility]  She recently fell and sprained her ankle.  She states that she has been having right leg pain since February 4  She also noted discoloration of her right toes  She had a duplex of her bypass and her right leg graft was found to be occluded with a severe drop in her ABI's she was sent to the hospital to be admitted on a heparin drip and vascular evaluation.  She is now having rest pain.  She was also going to receive gentle hydration given her renal insufficiency with a creatinine of 23.  The patient is a poorly controlled diabetic with glucose of 409 in the ER.  She is medically managed for hypertension  She takes a statin for hypercholesterolemia.  She is a former smoker.  She has a history of CAD, status post CABG in 2003  PAST MEDICAL HISTORY    Past Medical History:  Diagnosis Date  . Adenomatous polyp of colon   . Anemia   . Arthritis   . Asthma   . Carotid artery disease ([place])   . Carpal tunnel syndrome   . Chronic renal insufficiency    Stage 3 Kidney disease  . Coronary artery disease   . Depression   . Depression  with anxiety   . Diabetes mellitus    type II  . Diverticulosis of colon (without mention of hemorrhage)   . GERD (gastroesophageal reflux disease)   . Heart murmur   . History of hiatal hernia   . Hyperlipidemia   . Hypertension   . Hypothyroidism   . Obesity   . PVD (peripheral vascular disease) ([place])   . Thyroid disease    hypo     FAMILY HISTORY   Family History  Problem Relation Age of Onset  . Diabetes Mother   . Heart disease Father   . Colon polyps Daughter   . Colon cancer Neg Hx   . Esophageal cancer Neg Hx   . Stomach cancer Neg Hx   . Rectal cancer Neg Hx     SOCIAL HISTORY:   Social History   Socioeconomic History  . Marital status: Widowed    Spouse name: Not on file  . Number of children: 2  . Years of education: Not on file  . Highest education level: Not on file  Occupational History  . 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 Smoking status: Former Smoker    Packs/day: 1.00    Years: 30.00  Pack years: 30.00    Types: Cigarettes    Quit date: 2018    Years since quitting: 4.1  . Smokeless tobacco: Never Used  Vaping Use  . Vaping Use: Never used  Substance and Sexual Activity  . Alcohol use: No    Alcohol/week: 0.0 standard drinks  . Drug use: No  . Sexual activity: Not on file  Other Topics Concern  . Not on file  Social History Narrative   Daily Caffeine   Social Determinants of Health   Financial Resource Strain: Not on file  Food Insecurity: Not on file  Transportation Needs: Not on file  Physical Activity: Not on file  Stress: Not on file  Social Connections: Not on file  Intimate Partner Violence: Not on file    ALLERGIES:    No Known Allergies  CURRENT MEDICATIONS:    Current Facility-Administered Medications  Medication Dose Route Frequency Provider Last Rate Last Admin  . ALPRAZolam (XANAX) tablet 1 mg  1 mg Oral BID PRN Harris, Abigail, PA-C       . heparin ADULT infusion 100 units/mL (25000 units/28mL)  1,000 Units/hr Intravenous Continuous Plunkett, Whitney, MD      . heparin bolus via infusion 4,000 Units  4,000 Units Intravenous Once Blanchie Dessert, MD       Current Outpatient Medications  Medication Sig Dispense Refill  . acetaminophen-codeine (TYLENOL #3) 300-30 MG tablet Take 1-2 tablets by mouth every 6 (six) hours as needed for moderate pain. 15 tablet 0  . albuterol (PROVENTIL HFA;VENTOLIN HFA) 108 (90 BASE) MCG/ACT inhaler Inhale 2 puffs into the lungs every 6 (six) hours as needed for wheezing or shortness of breath.     . ALPRAZolam (XANAX) 1 MG tablet Take 1 mg by mouth 2 (two) times daily as needed for anxiety.     Marland Kitchen amLODipine (NORVASC) 10 MG tablet Take 10 mg by mouth daily.    Marland Kitchen aspirin EC 81 MG tablet Take 81 mg by mouth at bedtime.    Marland Kitchen atorvastatin (LIPITOR) 80 MG tablet Take 80 mg by mouth daily.     . calcitRIOL (ROCALTROL) 0.25 MCG capsule Take 0.25 mcg by mouth every Monday, Wednesday, and Friday.   6  . carvedilol (COREG) 12.5 MG tablet TAKE 1 TABLET BY MOUTH TWICE A DAY 180 tablet 0  . cetirizine (ZYRTEC) 10 MG tablet Take 10 mg by mouth daily.  6  . famotidine (PEPCID) 40 MG tablet TAKE 1 TABLET BY MOUTH EVERY DAY 90 tablet 1  . fenofibrate micronized (LOFIBRA) 200 MG capsule Take 200 mg by mouth at bedtime.     . ferrous sulfate 325 (65 FE) MG tablet Take 325 mg by mouth 3 (three) times daily.  3  . furosemide (LASIX) 40 MG tablet Take 40 mg by mouth daily.     . Insulin Glargine (BASAGLAR KWIKPEN) 100 UNIT/ML SOPN Inject 20-60 Units into the skin 2 (two) times daily before a meal. Per sliding scale  3  . levothyroxine (SYNTHROID) 125 MCG tablet Take 1 tablet daily in the morning. Take on an empty stomach.    . Omega-3 Fatty Acids (FISH OIL) 1000 MG CAPS Take 1,000 mg by mouth 2 (two) times daily.    Marland Kitchen PARoxetine (PAXIL) 40 MG tablet Take 60 mg by mouth at bedtime.     . prednisoLONE acetate (PRED  FORTE) 1 % ophthalmic suspension Place 1 drop into the right eye 2 (two) times daily.    . promethazine (PHENERGAN) 25 MG  tablet Take 25 mg by mouth daily as needed for nausea or vomiting.   6  . tiZANidine (ZANAFLEX) 4 MG tablet Take 1 tablet (4 mg total) by mouth every 6 (six) hours as needed for muscle spasms. 30 tablet 0  . Vitamin D, Ergocalciferol, (DRISDOL) 50000 UNITS CAPS capsule Take 50,000 Units by mouth 2 (two) times a week. Takes on Mondays and Fridays  1    REVIEW OF SYSTEMS:   [X]  denotes positive finding, [ ]  denotes negative finding Cardiac  Comments:  Chest pain or chest pressure:    Shortness of breath upon exertion:    Short of breath when lying flat:    Irregular heart rhythm:        Vascular    Pain in calf, thigh, or hip brought on by ambulation: x   Pain in feet at night that wakes you up from your sleep:  x   Blood clot in your veins:    Leg swelling:         Pulmonary    Oxygen at home:    Productive cough:     Wheezing:         Neurologic    Sudden weakness in arms or legs:     Sudden numbness in arms or legs:     Sudden onset of difficulty speaking or slurred speech:    Temporary loss of vision in one eye:     Problems with dizziness:         Gastrointestinal    Blood in stool:      Vomited blood:         Genitourinary    Burning when urinating:     Blood in urine:        Psychiatric    Major depression:         Hematologic    Bleeding problems:    Problems with blood clotting too easily:        Skin    Rashes or ulcers:        Constitutional    Fever or chills:     PHYSICAL EXAM:   Vitals:   08/26/20 1835 08/26/20 2010 08/26/20 2045  BP: 120/68 (!) 176/69 (!) 179/66  Pulse: 63 73 60  Resp: 17 17 12   Temp: (!) 97.5 F (36.4 C)    TempSrc: Oral    SpO2: 100% 100% 99%  Weight:  58.8 kg   Height:  5\' 4"  (1.626 m)     GENERAL: The patient is a well-nourished female, in no acute distress. The vital signs are documented  above. CARDIAC: There is a regular rate and rhythm.  VASCULAR: Nonpalpable pedal pulses on the right.  She has palpable bilateral femoral pulses PULMONARY: Nonlabored respirations ABDOMEN: Soft and non-tender with normal pitched bowel sounds.  MUSCULOSKELETAL: There are no major deformities or cyanosis. NEUROLOGIC: Decreased motor and sensory function to the right foot SKIN: There are no ulcers or rashes noted. PSYCHIATRIC: The patient has a normal affect.  STUDIES:   I have reviewed her U/S from today which shows a left ABI of 088 and a right ABI of 028 with an occluded right fem-pop bypass occlusion  ASSESSMENT and PLAN   Ischemic right leg with occluded bypass graft: I discussed with the patient this is a limb threatening situation and that we need to proceed with attempts at revascularization.  She is scheduled for angiography later today with Dr. Carlis Abbott.  This will be done with CO2 and  minimal IV contrast if dye is utilized.  The plan is to try and place a lytics catheter and perform thrombolysis overnight and repeat arteriogram tomorrow.  I did discuss with the patient that she potentially could require surgical intervention if lysis is not successful.  She is in agreement to proceed.   Leia Alf, MD, FACS [facility] [phone] Pager [phone]

## 2020-08-26 NOTE — ED Provider Notes (Signed)
[facility] EMERGENCY DEPARTMENT Provider Note   CSN: [id number] Arrival date & time: 08/26/20  1804     History Chief Complaint  Patient presents with  . Circulatory Problem    [name] is a 65 y.o. female who was sent in from her cardiology office.  Patient has an ankle injury few days ago was seen in urgent care she had a sprained ankle.  She went to see her cardiologist, Dr. Fletcher Anon, who ordered a vascular study found to have significantly diminished ABIs.  He contacted Dr. Trula Slade who plans to do an angiography tomorrow.  Cardiology service will admit the patient.  She complains of pain in the right ankle and foot.  She is status post bilateral femoropopliteal bypass.  HPI     Past Medical History:  Diagnosis Date  . Adenomatous polyp of colon   . Anemia   . Arthritis   . Asthma   . Carotid artery disease ([place])   . Carpal tunnel syndrome   . Chronic renal insufficiency    Stage 3 Kidney disease  . Coronary artery disease   . Depression   . Depression with anxiety   . Diabetes mellitus    type II  . Diverticulosis of colon (without mention of hemorrhage)   . GERD (gastroesophageal reflux disease)   . Heart murmur   . History of hiatal hernia   . Hyperlipidemia   . Hypertension   . Hypothyroidism   . Obesity   . PVD (peripheral vascular disease) ([place])   . Thyroid disease    hypo    Patient Active Problem List   Diagnosis Date Noted  . Abnormal CT scan, esophagus   . Hematochezia   . GIB (gastrointestinal bleeding) 08/01/2017  . Pressure injury of skin 11/25/2016  . PAD (peripheral artery disease) ([place]) 11/23/2016  . Major depressive disorder, recurrent episode, moderate ([place]) 04/04/2016  . Disorder resulting from impaired renal function 10/27/2009  . Bilateral carotid artery stenosis 10/08/2009  . Essential hypertension 10/02/2008  . GERD 03/11/2008  . COLONIC POLYPS 03/10/2008  . DM2 (diabetes mellitus, type 2) ([place]) 03/10/2008  .  Hyperlipidemia 03/10/2008  . ANXIETY DEPRESSION 03/10/2008  . Coronary atherosclerosis 03/10/2008  . Peripheral vascular disease ([place]) 03/10/2008  . Diverticulosis of colon 03/10/2008    Past Surgical History:  Procedure Laterality Date  . ABDOMINAL AORTAGRAM N/A 12/18/2013   Procedure: ABDOMINAL [name];  Surgeon: Wellington Hampshire, MD;  Location: [facility] CATH LAB;  Service: Cardiovascular;  Laterality: N/A;  . ABDOMINAL AORTOGRAM W/LOWER EXTREMITY N/A 11/16/2016   Procedure: Abdominal Aortogram w/Lower Extremity;  Surgeon: Wellington Hampshire, MD;  Location: [facility] CV LAB;  Service: Cardiovascular;  Laterality: N/A;  . ABDOMINAL AORTOGRAM W/LOWER EXTREMITY N/A 01/25/2017   Procedure: Abdominal Aortogram w/Lower Extremity;  Surgeon: Wellington Hampshire, MD;  Location: [facility] CV LAB;  Service: Cardiovascular;  Laterality: N/A;  only completed Lower Extremity  . ABDOMINAL AORTOGRAM W/LOWER EXTREMITY N/A 02/28/2018   Procedure: ABDOMINAL AORTOGRAM W/LOWER EXTREMITY;  Surgeon: Wellington Hampshire, MD;  Location: [facility] CV LAB;  Service: Cardiovascular;  Laterality: N/A;  . ABDOMINAL HYSTERECTOMY    . APPENDECTOMY    . APPLICATION OF WOUND VAC Right 02/13/2017   Procedure: APPLICATION OF INCISIONAL WOUND VAC RIGHT GROIN;  Surgeon: Elam Dutch, MD;  Location: [facility];  Service: Vascular;  Laterality: Right;  . CHOLECYSTECTOMY    . CORONARY ARTERY BYPASS GRAFT  2004   x4  . ENDARTERECTOMY FEMORAL  Left 11/23/2016   Procedure: ENDARTERECTOMY OF LEFT EXTERNAL COMMON FEMORAL ARTERY WITH EXTENDED LEFT PROFUNDOPLASTY;  Surgeon: Elam Dutch, MD;  Location: [facility];  Service: Vascular;  Laterality: Left;  . ENDARTERECTOMY FEMORAL Right 02/13/2017   Procedure: ENDARTERECTOMY RIGHT FEMORAL ARTERY WITH PROFUNDAPLASTY;  Surgeon: Elam Dutch, MD;  Location: [facility] OR;  Service: Vascular;  Laterality: Right;  . ESOPHAGOGASTRODUODENOSCOPY N/A 08/02/2017   Procedure: ESOPHAGOGASTRODUODENOSCOPY (EGD);   Surgeon: Ladene Artist, MD;  Location: [facility] ENDOSCOPY;  Service: Endoscopy;  Laterality: N/A;  . FEMORAL-POPLITEAL BYPASS GRAFT Left 11/23/2016   Procedure: LEFT FEMORAL-BELOW KNEE POPLITEAL ARTERY BYPASS;  Surgeon: Elam Dutch, MD;  Location: [facility];  Service: Vascular;  Laterality: Left;  . FEMORAL-POPLITEAL BYPASS GRAFT Right 02/13/2017   Procedure: BYPASS GRAFT RIGHT FEMORAL-BELOW KNEE POPLITEAL ARTERY;  Surgeon: Elam Dutch, MD;  Location: [facility];  Service: Vascular;  Laterality: Right;  . KNEE SURGERY Right 2013   arthroscopy  . PATCH ANGIOPLASTY Right 02/13/2017   Procedure: VEIN PATCH ANGIOPLASTY RIGHT POPLITEAL ARTERY AND HEMASHIELD PATCH ANGIOPLASTY OF RIGHT FEMORAL ARTERY;  Surgeon: Elam Dutch, MD;  Location: [facility];  Service: Vascular;  Laterality: Right;  . PERIPHERAL VASCULAR BALLOON ANGIOPLASTY  02/28/2018   Procedure: PERIPHERAL VASCULAR BALLOON ANGIOPLASTY;  Surgeon: Wellington Hampshire, MD;  Location: [facility] CV LAB;  Service: Cardiovascular;;  Left Fem-pop Bypass     OB History   No obstetric history on file.     Family History  Problem Relation Age of Onset  . Diabetes Mother   . Heart disease Father   . Colon polyps Daughter   . Colon cancer Neg Hx   . Esophageal cancer Neg Hx   . Stomach cancer Neg Hx   . Rectal cancer Neg Hx     Social History   Tobacco Use  . Smoking status: Former Smoker    Packs/day: 1.00    Years: 30.00    Pack years: 30.00    Types: Cigarettes    Quit date: 2018    Years since quitting: 4.1  . Smokeless tobacco: Never Used  Vaping Use  . Vaping Use: Never used  Substance Use Topics  . Alcohol use: No    Alcohol/week: 0.0 standard drinks  . Drug use: No    Home Medications Prior to Admission medications   Medication Sig Start Date End Date Taking? Authorizing Provider  acetaminophen-codeine (TYLENOL #3) 300-30 MG tablet Take 1-2 tablets by mouth every 6 (six) hours as needed for moderate pain. 08/14/20    Faustino Congress, NP  albuterol (PROVENTIL HFA;VENTOLIN HFA) 108 (90 BASE) MCG/ACT inhaler Inhale 2 puffs into the lungs every 6 (six) hours as needed for wheezing or shortness of breath.     [provider]  ALPRAZolam Duanne Moron) 1 MG tablet Take 1 mg by mouth 2 (two) times daily as needed for anxiety.  05/11/11   [provider]  amLODipine (NORVASC) 10 MG tablet Take 10 mg by mouth daily. 05/16/19   [provider]  aspirin EC 81 MG tablet Take 81 mg by mouth at bedtime.    [provider]  atorvastatin (LIPITOR) 80 MG tablet Take 80 mg by mouth daily.     [provider]  calcitRIOL (ROCALTROL) 0.25 MCG capsule Take 0.25 mcg by mouth every Monday, Wednesday, and Friday.  07/02/17   [provider]  carvedilol (COREG) 12.5 MG tablet TAKE 1 TABLET BY MOUTH TWICE A DAY 06/15/20   Wellington Hampshire,  MD  cetirizine (ZYRTEC) 10 MG tablet Take 10 mg by mouth daily. 06/30/17   [provider]  famotidine (PEPCID) 40 MG tablet TAKE 1 TABLET BY MOUTH EVERY DAY 05/01/20   Wellington Hampshire, MD  fenofibrate micronized (LOFIBRA) 200 MG capsule Take 200 mg by mouth at bedtime.     [provider]  ferrous sulfate 325 (65 FE) MG tablet Take 325 mg by mouth 3 (three) times daily. 05/10/15   [provider]  furosemide (LASIX) 40 MG tablet Take 40 mg by mouth daily.  05/08/11   [provider]  Insulin Glargine (BASAGLAR KWIKPEN) 100 UNIT/ML SOPN Inject 20-60 Units into the skin 2 (two) times daily before a meal. Per sliding scale 01/26/17   [provider]  levothyroxine (SYNTHROID) 125 MCG tablet Take 1 tablet daily in the morning. Take on an empty stomach. 12/09/19 12/08/20  [provider]  Omega-3 Fatty Acids (FISH OIL) 1000 MG CAPS Take 1,000 mg by mouth 2 (two) times daily.    [provider]  PARoxetine (PAXIL) 40 MG tablet Take 60 mg by mouth at bedtime.     [provider]   prednisoLONE acetate (PRED FORTE) 1 % ophthalmic suspension Place 1 drop into the right eye 2 (two) times daily.    [provider]  promethazine (PHENERGAN) 25 MG tablet Take 25 mg by mouth daily as needed for nausea or vomiting.  08/06/16   [provider]  tiZANidine (ZANAFLEX) 4 MG tablet Take 1 tablet (4 mg total) by mouth every 6 (six) hours as needed for muscle spasms. 08/14/20   Faustino Congress, NP  Vitamin D, Ergocalciferol, (DRISDOL) 50000 UNITS CAPS capsule Take 50,000 Units by mouth 2 (two) times a week. Takes on Mondays and Fridays    [provider]    Allergies    Patient has no known allergies.  Review of Systems   Review of Systems Ten systems reviewed and are negative for acute change, except as noted in the HPI.   Physical Exam Updated Vital Signs BP (!) 176/69 (BP Location: Right Arm)   Pulse 73   Temp (!) 97.5 F (36.4 C) (Oral)   Resp 17   SpO2 100%   Physical Exam Vitals and nursing note reviewed.  Constitutional:      General: She is not in acute distress.    Appearance: She is well-developed and well-nourished. She is not diaphoretic.  HENT:     Head: Normocephalic and atraumatic.  Eyes:     General: No scleral icterus.    Conjunctiva/sclera: Conjunctivae normal.  Cardiovascular:     Rate and Rhythm: Normal rate and regular rhythm.     Heart sounds: Normal heart sounds. No murmur heard. No friction rub. No gallop.      Comments: Doppler pulses of the left foot show monophasic, quiet and thready DP and PT pulse.  Biphasic DP pulse present and palpable in the left foot. The right foot is cool to touch, with discoloration, pale toes and cold, purple great toe Pulmonary:     Effort: Pulmonary effort is normal. No respiratory distress.     Breath sounds: Normal breath sounds.  Abdominal:     General: Bowel sounds are normal. There is no distension.     Palpations: Abdomen is soft. There is no mass.     Tenderness: There is  no abdominal tenderness. There is no guarding.  Musculoskeletal:     Cervical back: Normal range of motion.  Skin:    General: Skin is warm and dry.  Neurological:     Mental Status: She is alert and oriented to person, place, and time.  Psychiatric:        Behavior: Behavior normal.     ED Results / Procedures / Treatments   Labs (all labs ordered are listed, but only abnormal results are displayed) Labs Reviewed  CBC WITH DIFFERENTIAL/PLATELET - Abnormal; Notable for the following components:      Result Value   WBC 12.8 (*)    Neutro Abs 9.8 (*)    Abs Immature Granulocytes 0.08 (*)    All other components within normal limits  COMPREHENSIVE METABOLIC PANEL - Abnormal; Notable for the following components:   Sodium 127 (*)    Chloride 90 (*)    Glucose, Bld 409 (*)    BUN 39 (*)    Creatinine, Ser 2.30 (*)    Albumin 2.7 (*)    GFR, Estimated 23 (*)    All other components within normal limits    EKG None  Radiology VAS [place] ABI WITH/WO TBI  Result Date: 08/26/2020 LOWER EXTREMITY DOPPLER STUDY Indications: Claudication, rest pain, peripheral artery disease, and Patient              fell and hurt her right ankle 08/14/2020 and started having pain              going up the calf. The right 1st and 2nd toes are purple in color.              She has rest pain all day and night. She is currently wearing an              orthopedic boot. High Risk Factors: Hypertension, hyperlipidemia, Diabetes, past history of                    smoking, coronary artery disease. Other Factors: SEE BILATERAL LEG ARTERIAL DUPLEX REPORT.  Vascular Interventions: Right FEM-POP BPG 02/23/2017                         Left FEM-POP BPG 11/23/2016. Comparison Study: Prior ABI 09/03/2019 Right 1.17 Left 1.20 Performing Technologist: Salvadore Dom RVT  Examination Guidelines: A complete evaluation includes at minimum, Doppler waveform signals and systolic blood pressure reading at the level of bilateral brachial,  anterior tibial, and posterior tibial arteries, when vessel segments are accessible. Bilateral testing is considered an integral part of a complete examination. Photoelectric Plethysmograph (PPG) waveforms and toe systolic pressure readings are included as required and additional duplex testing as needed. Limited examinations for reoccurring indications may be performed as noted.  ABI Findings: +---------+------------------+-----+-------------------+--------+ Right    Rt Pressure (mmHg)IndexWaveform           Comment  +---------+------------------+-----+-------------------+--------+ Brachial 174                                                +---------+------------------+-----+-------------------+--------+ ATA      49                0.28 dampened monophasic         +---------+------------------+-----+-------------------+--------+ PTA      49                0.28 dampened monophasic         +---------+------------------+-----+-------------------+--------+  PERO                            absent                      +---------+------------------+-----+-------------------+--------+ Great Toe0                 0.00 Absent                      +---------+------------------+-----+-------------------+--------+ +---------+------------------+-----+----------+-------+ Left     Lt Pressure (mmHg)IndexWaveform  Comment +---------+------------------+-----+----------+-------+ Brachial 172                                      +---------+------------------+-----+----------+-------+ ATA      15                0.09 biphasic          +---------+------------------+-----+----------+-------+ PTA      146               0.84 biphasic          +---------+------------------+-----+----------+-------+ PERO     127               0.73 monophasic        +---------+------------------+-----+----------+-------+ Great Toe120               0.69 Abnormal           +---------+------------------+-----+----------+-------+ +-------+-----------+-----------+------------+------------+ ABI/TBIToday's ABIToday's TBIPrevious ABIPrevious TBI +-------+-----------+-----------+------------+------------+ Right  .28        0          1.17        .90          +-------+-----------+-----------+------------+------------+ Left   .88        .69        1.20        1.22         +-------+-----------+-----------+------------+------------+ TOES Findings: +----------+---------------+--------+-------+ Right ToesPressure (mmHg)WaveformComment +----------+---------------+--------+-------+ 1st Digit                Absent          +----------+---------------+--------+-------+ 2nd Digit                Absent          +----------+---------------+--------+-------+ 3rd Digit                Absent          +----------+---------------+--------+-------+ 4th Digit                Absent          +----------+---------------+--------+-------+ 5th Digit                Absent          +----------+---------------+--------+-------+  +---------+---------------+--------+-------+ Left ToesPressure (mmHg)WaveformComment +---------+---------------+--------+-------+ 1st Digit               Abnormal        +---------+---------------+--------+-------+ 2nd Digit               Abnormal        +---------+---------------+--------+-------+ 3rd Digit               Abnormal        +---------+---------------+--------+-------+ 4th Digit               Abnormal        +---------+---------------+--------+-------+  5th Digit               Abnormal        +---------+---------------+--------+-------+   Bilateral ABIs and TBIs appear decreased compared to prior study on 09/03/2019.  Findings reported to Dr. Tyrell Antonio email through [facility] and [place], RN in [place] at 4:40 pm . Summary: Right: Resting right ankle-brachial index indicates critical limb ischemia. The right  toe-brachial index is abnormal. Left: Resting left ankle-brachial index indicates mild left lower extremity arterial disease. The left toe-brachial index is abnormal.  *See table(s) above for measurements and observations.  Vascular consult recommended. Electronically signed by Ida Rogue MD on 08/26/2020 at 7:11:09 PM.    Final    VAS [place] LOWER EXTREMITY ARTERIAL DUPLEX  Result Date: 08/26/2020 LOWER EXTREMITY ARTERIAL DUPLEX STUDY Indications: Claudication, rest pain, and Patient fell and hurt her right ankle              08/14/2020 and started having pain going up the calf. The right 1st              and 2nd toes are purple in color. She has rest pain all day and              night. She is currently wearing an orthopedic boot. High Risk Factors: Hypertension, hyperlipidemia, Diabetes, past history of                    smoking, coronary artery disease. Other Factors: SEE ABI REPORT.  Vascular Interventions: Right FEM-POP BPG 02/23/2017                         Left FEM-POP BPG 11/23/2016. Current ABI:            Right .28 Left .32 Comparison Study: Prior bilateral leg arterial duplex exam on 09/03/2019 Right                   highest velocity 124 cm/s at proximal anastomosis. Left                   proximal anastomosis 134 cm/s. Performing Technologist: Salvadore Dom RVT, RDCS (AE), RDMS  Examination Guidelines: A complete evaluation includes B-mode imaging, spectral Doppler, color Doppler, and power Doppler as needed of all accessible portions of each vessel. Bilateral testing is considered an integral part of a complete examination. Limited examinations for reoccurring indications may be performed as noted.  +-----------+--------+-----+--------+-------------------+--------+ RIGHT      PSV cm/sRatioStenosisWaveform           Comments +-----------+--------+-----+--------+-------------------+--------+ CFA Prox   82                   barely triphasic             +-----------+--------+-----+--------+-------------------+--------+ CFA Distal 81                   monophasic                  +-----------+--------+-----+--------+-------------------+--------+ DFA        165                  monophasic                  +-----------+--------+-----+--------+-------------------+--------+ ATA Prox   18                   dampened monophasic         +-----------+--------+-----+--------+-------------------+--------+  ATA Mid    19                   monophasic                  +-----------+--------+-----+--------+-------------------+--------+ ATA Distal 13                   monophasic                  +-----------+--------+-----+--------+-------------------+--------+ PTA Prox   25                   monophasic                  +-----------+--------+-----+--------+-------------------+--------+ PTA Mid    22                   monophasic                  +-----------+--------+-----+--------+-------------------+--------+ PTA Distal 15                   monophasic                  +-----------+--------+-----+--------+-------------------+--------+ PERO Prox  8                    dampened monophasic         +-----------+--------+-----+--------+-------------------+--------+ PERO Mid   8                    monophasic                  +-----------+--------+-----+--------+-------------------+--------+ PERO Distal8                    monophasic                  +-----------+--------+-----+--------+-------------------+--------+ Right pedal artery 269 ms, category 4 severe ischemia.  Right Graft #1: FEM-POP BPG +------------------+--------+--------+----------+--------+                   PSV cm/sStenosisWaveform  Comments +------------------+--------+--------+----------+--------+ Inflow            81              monophasic         +------------------+--------+--------+----------+--------+ Prox Anastomosis           occluded                   +------------------+--------+--------+----------+--------+ Proximal Graft            occluded                   +------------------+--------+--------+----------+--------+ Mid Graft                 occluded                   +------------------+--------+--------+----------+--------+ Distal Graft              occluded                   +------------------+--------+--------+----------+--------+ Distal Anastomosis28              monophasic         +------------------+--------+--------+----------+--------+ Outflow           29              monophasic         +------------------+--------+--------+----------+--------+ OCCLUDED BPG  +----------+--------+-----+--------+----------------+--------+  LEFT      PSV cm/sRatioStenosisWaveform        Comments +----------+--------+-----+--------+----------------+--------+ CFA Prox  210                  biphasic                 +----------+--------+-----+--------+----------------+--------+ CFA Distal148                  biphasic                 +----------+--------+-----+--------+----------------+--------+ DFA       103                  barely triphasic         +----------+--------+-----+--------+----------------+--------+ SFA Prox  148                                           +----------+--------+-----+--------+----------------+--------+ TP Trunk  161                  biphasic                 +----------+--------+-----+--------+----------------+--------+  Left Graft #1: FEM-POP BPG +--------------------+--------+--------+--------+--------+                     PSV cm/sStenosisWaveformComments +--------------------+--------+--------+--------+--------+ Inflow              148             biphasic         +--------------------+--------+--------+--------+--------+ Proximal Anastomosis160             biphasic          +--------------------+--------+--------+--------+--------+ Proximal Graft      48              biphasic         +--------------------+--------+--------+--------+--------+ Mid Graft           69              biphasic         +--------------------+--------+--------+--------+--------+ Distal Graft        51              biphasic         +--------------------+--------+--------+--------+--------+ Distal Anastomosis  139             biphasic         +--------------------+--------+--------+--------+--------+ Outflow             161             biphasicTPT      +--------------------+--------+--------+--------+--------+ Patent BPG, no stenosis seen.  Findings reported to Dr. Tyrell Antonio email through [facility] and [place], RN in [place] at 4:40 pm.  Summary: Right: Severe progression is noted compared to previous study. Occluded FEM-POP BPG. Left: No significant change as compared to previous study. Patent FEM-POP BPG.  See table(s) above for measurements and observations. Vascular consult recommended. Electronically signed by Ida Rogue MD on 08/26/2020 at 56:08:56 PM.    Final     Procedures Procedures   Medications Ordered in ED Medications - No data to display  ED Course  I have reviewed the triage vital signs and the nursing notes.  Pertinent labs & imaging results that were available during my care of the patient were reviewed by me and considered in my medical decision making (see  chart for details).    MDM Rules/Calculators/A&P                          Patient with ischemic foot on the Right. Patient will need admission. Labs show slightly low sodium with elevated glucose, mild AKI. Getting fluids.  Patient will be admitted by the cardiology service.  I have given signout to [place] for consult and admission Final Clinical Impression(s) / ED Diagnoses Final diagnoses:  None    Rx / DC Orders ED Discharge Orders    None       Margarita Mail, PA-C 08/27/20  0003    Blanchie Dessert, MD 08/28/20 1525

## 2020-08-26 NOTE — ED Triage Notes (Signed)
Pt was sent here by her cardiologist after have an abnormal ABI study and sent for admission and plan for angiogram tomorrow. Pt has been having what started as right ankle pain from a fall and result into pain in right leg, hx of femoral bypass

## 2020-08-26 NOTE — Telephone Encounter (Signed)
The patient called our office yesterday due to severe pain affecting the right lower extremity with discoloration of her toes. She has been dealing with a sprained right ankle and was seen by orthopedic surgery. Her symptoms might have started on 4 February. I arranged for her to have Doppler studies in our office today which showed severe drop in ABI to 0.28 on the right with evidence of occluded femoral-popliteal bypass graft.  Given her symptoms and Doppler findings, the patient was notified to go to the emergency department for admission. She will be admitted under our service ( cardiology) and will need gentle hydration overnight for an anticipated angiogram tomorrow.  The patient's previous surgery was done by Dr. Oneida Alar. I discussed the case with Dr. Trula Slade and I asked him to see the patient tomorrow with plans for an angiogram by him.

## 2020-08-27 ENCOUNTER — Encounter (HOSPITAL_COMMUNITY)
Admission: EM | Disposition: A | Discharge status: Home / Self Care | Payer: Self-pay | Source: Home / Self Care | Attending: Cardiovascular Disease

## 2020-08-27 ENCOUNTER — Encounter (HOSPITAL_COMMUNITY): Payer: Self-pay | Admitting: Internal Medicine

## 2020-08-27 DIAGNOSIS — Z9049 Acquired absence of other specified parts of digestive tract: Secondary | ICD-10-CM | POA: Diagnosis not present

## 2020-08-27 DIAGNOSIS — I998 Other disorder of circulatory system: Secondary | ICD-10-CM | POA: Diagnosis present

## 2020-08-27 DIAGNOSIS — Z87891 Personal history of nicotine dependence: Secondary | ICD-10-CM | POA: Diagnosis not present

## 2020-08-27 DIAGNOSIS — N184 Chronic kidney disease, stage 4 (severe): Secondary | ICD-10-CM

## 2020-08-27 DIAGNOSIS — Z79890 Hormone replacement therapy (postmenopausal): Secondary | ICD-10-CM | POA: Diagnosis not present

## 2020-08-27 DIAGNOSIS — I70221 Atherosclerosis of native arteries of extremities with rest pain, right leg: Secondary | ICD-10-CM | POA: Diagnosis not present

## 2020-08-27 DIAGNOSIS — Z90710 Acquired absence of both cervix and uterus: Secondary | ICD-10-CM | POA: Diagnosis not present

## 2020-08-27 DIAGNOSIS — I25708 Atherosclerosis of coronary artery bypass graft(s), unspecified, with other forms of angina pectoris: Secondary | ICD-10-CM | POA: Diagnosis not present

## 2020-08-27 DIAGNOSIS — T82868A Thrombosis of vascular prosthetic devices, implants and grafts, initial encounter: Secondary | ICD-10-CM | POA: Diagnosis present

## 2020-08-27 DIAGNOSIS — T82858A Stenosis of vascular prosthetic devices, implants and grafts, initial encounter: Secondary | ICD-10-CM | POA: Diagnosis not present

## 2020-08-27 DIAGNOSIS — E039 Hypothyroidism, unspecified: Secondary | ICD-10-CM | POA: Diagnosis present

## 2020-08-27 DIAGNOSIS — Z794 Long term (current) use of insulin: Secondary | ICD-10-CM | POA: Diagnosis not present

## 2020-08-27 DIAGNOSIS — E871 Hypo-osmolality and hyponatremia: Secondary | ICD-10-CM | POA: Diagnosis not present

## 2020-08-27 DIAGNOSIS — S93401D Sprain of unspecified ligament of right ankle, subsequent encounter: Secondary | ICD-10-CM | POA: Diagnosis not present

## 2020-08-27 DIAGNOSIS — I70229 Atherosclerosis of native arteries of extremities with rest pain, unspecified extremity: Secondary | ICD-10-CM | POA: Diagnosis not present

## 2020-08-27 DIAGNOSIS — Z951 Presence of aortocoronary bypass graft: Secondary | ICD-10-CM | POA: Diagnosis not present

## 2020-08-27 DIAGNOSIS — N179 Acute kidney failure, unspecified: Secondary | ICD-10-CM | POA: Diagnosis present

## 2020-08-27 DIAGNOSIS — Y832 Surgical operation with anastomosis, bypass or graft as the cause of abnormal reaction of the patient, or of later complication, without mention of misadventure at the time of the procedure: Secondary | ICD-10-CM | POA: Diagnosis present

## 2020-08-27 DIAGNOSIS — Z20822 Contact with and (suspected) exposure to covid-19: Secondary | ICD-10-CM | POA: Diagnosis present

## 2020-08-27 DIAGNOSIS — Z8249 Family history of ischemic heart disease and other diseases of the circulatory system: Secondary | ICD-10-CM | POA: Diagnosis not present

## 2020-08-27 DIAGNOSIS — Z86010 Personal history of colonic polyps: Secondary | ICD-10-CM | POA: Diagnosis not present

## 2020-08-27 DIAGNOSIS — I739 Peripheral vascular disease, unspecified: Secondary | ICD-10-CM

## 2020-08-27 DIAGNOSIS — I10 Essential (primary) hypertension: Secondary | ICD-10-CM

## 2020-08-27 DIAGNOSIS — E1165 Type 2 diabetes mellitus with hyperglycemia: Secondary | ICD-10-CM

## 2020-08-27 DIAGNOSIS — W19XXXD Unspecified fall, subsequent encounter: Secondary | ICD-10-CM | POA: Diagnosis present

## 2020-08-27 DIAGNOSIS — Z79899 Other long term (current) drug therapy: Secondary | ICD-10-CM | POA: Diagnosis not present

## 2020-08-27 DIAGNOSIS — Z833 Family history of diabetes mellitus: Secondary | ICD-10-CM | POA: Diagnosis not present

## 2020-08-27 DIAGNOSIS — E785 Hyperlipidemia, unspecified: Secondary | ICD-10-CM | POA: Diagnosis present

## 2020-08-27 DIAGNOSIS — I6523 Occlusion and stenosis of bilateral carotid arteries: Secondary | ICD-10-CM | POA: Diagnosis present

## 2020-08-27 DIAGNOSIS — Z7982 Long term (current) use of aspirin: Secondary | ICD-10-CM | POA: Diagnosis not present

## 2020-08-27 DIAGNOSIS — I2510 Atherosclerotic heart disease of native coronary artery without angina pectoris: Secondary | ICD-10-CM | POA: Diagnosis present

## 2020-08-27 DIAGNOSIS — K219 Gastro-esophageal reflux disease without esophagitis: Secondary | ICD-10-CM | POA: Diagnosis present

## 2020-08-27 DIAGNOSIS — Z8371 Family history of colonic polyps: Secondary | ICD-10-CM | POA: Diagnosis not present

## 2020-08-27 HISTORY — PX: ABDOMINAL AORTOGRAM W/LOWER EXTREMITY: CATH118223

## 2020-08-27 LAB — BASIC METABOLIC PANEL
Anion gap: 11 (ref 5–15)
BUN: 37 mg/dL — ABNORMAL HIGH (ref 8–23)
CO2: 24 mmol/L (ref 22–32)
Calcium: 8.9 mg/dL (ref 8.9–10.3)
Chloride: 99 mmol/L (ref 98–111)
Creatinine, Ser: 2 mg/dL — ABNORMAL HIGH (ref 0.44–1.00)
GFR, Estimated: 27 mL/min — ABNORMAL LOW (ref >60)
Glucose, Bld: 276 mg/dL — ABNORMAL HIGH (ref 70–99)
Potassium: 4 mmol/L (ref 3.5–5.1)
Sodium: 134 mmol/L — ABNORMAL LOW (ref 135–145)

## 2020-08-27 LAB — RESP PANEL BY RT-PCR (FLU A&B, COVID) ARPGX2
Influenza A by PCR: NEGATIVE
Influenza B by PCR: NEGATIVE
SARS Coronavirus 2 by RT PCR: NEGATIVE

## 2020-08-27 LAB — CBC
HCT: 38.4 % (ref 36.0–46.0)
HCT: 34.5 % — ABNORMAL LOW (ref 36.0–46.0)
Hemoglobin: 12.8 g/dL (ref 12.0–15.0)
Hemoglobin: 11.5 g/dL — ABNORMAL LOW (ref 12.0–15.0)
MCH: 29.2 pg (ref 26.0–34.0)
MCH: 29.3 pg (ref 26.0–34.0)
MCHC: 33.3 g/dL (ref 30.0–36.0)
MCHC: 33.3 g/dL (ref 30.0–36.0)
MCV: 87.7 fL (ref 80.0–100.0)
MCV: 87.8 fL (ref 80.0–100.0)
Platelets: 291 10*3/uL (ref 150–400)
Platelets: 269 10*3/uL (ref 150–400)
RBC: 4.38 MIL/uL (ref 3.87–5.11)
RBC: 3.93 MIL/uL (ref 3.87–5.11)
RDW: 12.9 % (ref 11.5–15.5)
RDW: 13 % (ref 11.5–15.5)
WBC: 13.3 10*3/uL — ABNORMAL HIGH (ref 4.0–10.5)
WBC: 11 10*3/uL — ABNORMAL HIGH (ref 4.0–10.5)
nRBC: 0 % (ref 0.0–0.2)
nRBC: 0 % (ref 0.0–0.2)

## 2020-08-27 LAB — CBG MONITORING, ED
Glucose-Capillary: 244 mg/dL — ABNORMAL HIGH (ref 70–99)
Glucose-Capillary: 76 mg/dL (ref 70–99)
Glucose-Capillary: 132 mg/dL — ABNORMAL HIGH (ref 70–99)

## 2020-08-27 LAB — FIBRINOGEN: Fibrinogen: 749 mg/dL — ABNORMAL HIGH (ref 210–475)

## 2020-08-27 LAB — HEPARIN LEVEL (UNFRACTIONATED)
Heparin Unfractionated: 0.63 IU/mL (ref 0.30–0.70)
Heparin Unfractionated: 0.5 IU/mL (ref 0.30–0.70)
Heparin Unfractionated: 0.43 IU/mL (ref 0.30–0.70)

## 2020-08-27 LAB — GLUCOSE, CAPILLARY
Glucose-Capillary: 112 mg/dL — ABNORMAL HIGH (ref 70–99)
Glucose-Capillary: 55 mg/dL — ABNORMAL LOW (ref 70–99)
Glucose-Capillary: 87 mg/dL (ref 70–99)

## 2020-08-27 LAB — POCT ACTIVATED CLOTTING TIME: Activated Clotting Time: 249 seconds

## 2020-08-27 LAB — HEMOGLOBIN A1C
Hgb A1c MFr Bld: 12.2 % — ABNORMAL HIGH (ref 4.8–5.6)
Mean Plasma Glucose: 303.44 mg/dL

## 2020-08-27 LAB — MRSA PCR SCREENING: MRSA by PCR: NEGATIVE

## 2020-08-27 SURGERY — ABDOMINAL AORTOGRAM W/LOWER EXTREMITY
Anesthesia: LOCAL

## 2020-08-27 MED ORDER — ACETAMINOPHEN-CODEINE #3 300-30 MG PO TABS
1.0000 | ORAL_TABLET | Freq: Four times a day (QID) | ORAL | Status: DC | PRN
  Administered 2020-08-27: 1 via ORAL
  Administered 2020-08-28 (×2): 2 via ORAL
  Administered 2020-08-29: 1 via ORAL
  Filled 2020-08-27 (×2): qty 2
  Filled 2020-08-27: qty 1
  Filled 2020-08-27: qty 2

## 2020-08-27 MED ORDER — INSULIN GLARGINE 100 UNIT/ML ~~LOC~~ SOLN
15.0000 [IU] | Freq: Every day | SUBCUTANEOUS | Status: DC
  Administered 2020-08-27 – 2020-08-28 (×3): 15 [IU] via SUBCUTANEOUS
  Filled 2020-08-27 (×4): qty 0.15

## 2020-08-27 MED ORDER — ALPRAZOLAM 0.5 MG PO TABS
1.0000 mg | ORAL_TABLET | Freq: Three times a day (TID) | ORAL | Status: DC | PRN
  Administered 2020-08-28: 1 mg via ORAL
  Filled 2020-08-27: qty 2

## 2020-08-27 MED ORDER — AMLODIPINE BESYLATE 10 MG PO TABS
10.0000 mg | ORAL_TABLET | Freq: Every day | ORAL | Status: DC
  Administered 2020-08-27 – 2020-08-29 (×3): 10 mg via ORAL
  Filled 2020-08-27: qty 2
  Filled 2020-08-27 (×2): qty 1

## 2020-08-27 MED ORDER — SODIUM CHLORIDE 0.9 % IV SOLN
1.0000 mg/h | INTRAVENOUS | Status: DC
  Administered 2020-08-27: 1 mg/h
  Filled 2020-08-27 (×5): qty 10

## 2020-08-27 MED ORDER — MORPHINE SULFATE (PF) 4 MG/ML IV SOLN
5.0000 mg | INTRAVENOUS | Status: DC | PRN
  Administered 2020-08-27 – 2020-08-28 (×5): 5 mg via INTRAVENOUS
  Filled 2020-08-27 (×5): qty 2

## 2020-08-27 MED ORDER — LIDOCAINE HCL (PF) 1 % IJ SOLN
INTRAMUSCULAR | Status: DC | PRN
  Administered 2020-08-27: 17 mL via INTRADERMAL

## 2020-08-27 MED ORDER — SODIUM CHLORIDE 0.9 % IV SOLN: 250.0000 mL | INTRAVENOUS | Status: DC | PRN

## 2020-08-27 MED ORDER — HEPARIN SODIUM (PORCINE) 1000 UNIT/ML IJ SOLN
INTRAMUSCULAR | Status: AC
  Filled 2020-08-27: qty 1

## 2020-08-27 MED ORDER — HYDROMORPHONE HCL 1 MG/ML IJ SOLN
INTRAMUSCULAR | Status: DC | PRN
  Administered 2020-08-27: 0.5 mg via INTRAVENOUS

## 2020-08-27 MED ORDER — CHLORHEXIDINE GLUCONATE 0.12 % MT SOLN
15.0000 mL | Freq: Two times a day (BID) | OROMUCOSAL | Status: DC
  Administered 2020-08-27 – 2020-08-29 (×3): 15 mL via OROMUCOSAL
  Filled 2020-08-27 (×3): qty 15

## 2020-08-27 MED ORDER — HYDROMORPHONE HCL 1 MG/ML IJ SOLN
INTRAMUSCULAR | Status: AC
  Filled 2020-08-27: qty 0.5

## 2020-08-27 MED ORDER — TIZANIDINE HCL 4 MG PO TABS: 4.0000 mg | ORAL_TABLET | Freq: Four times a day (QID) | ORAL | Status: DC | PRN

## 2020-08-27 MED ORDER — CHLORHEXIDINE GLUCONATE CLOTH 2 % EX PADS
6.0000 | MEDICATED_PAD | Freq: Every day | CUTANEOUS | Status: DC
  Administered 2020-08-27 – 2020-08-29 (×3): 6 via TOPICAL

## 2020-08-27 MED ORDER — HEPARIN (PORCINE) IN NACL 1000-0.9 UT/500ML-% IV SOLN
INTRAVENOUS | Status: DC | PRN
  Administered 2020-08-27 (×2): 500 mL

## 2020-08-27 MED ORDER — PAROXETINE HCL 20 MG PO TABS
60.0000 mg | ORAL_TABLET | Freq: Every day | ORAL | Status: DC
Start: 2020-08-27 — End: 2020-08-29
  Administered 2020-08-27 – 2020-08-28 (×2): 60 mg via ORAL
  Filled 2020-08-27 (×2): qty 3
  Filled 2020-08-27: qty 2

## 2020-08-27 MED ORDER — IODIXANOL 320 MG/ML IV SOLN
INTRAVENOUS | Status: DC | PRN
  Administered 2020-08-27: 4 mL via INTRA_ARTERIAL

## 2020-08-27 MED ORDER — ONDANSETRON HCL 4 MG/2ML IJ SOLN: 4.0000 mg | Freq: Four times a day (QID) | INTRAMUSCULAR | Status: DC | PRN

## 2020-08-27 MED ORDER — MIDAZOLAM HCL 2 MG/2ML IJ SOLN
INTRAMUSCULAR | Status: AC
  Filled 2020-08-27: qty 2

## 2020-08-27 MED ORDER — FAMOTIDINE 20 MG PO TABS
40.0000 mg | ORAL_TABLET | Freq: Every day | ORAL | Status: DC
  Administered 2020-08-28: 40 mg via ORAL
  Filled 2020-08-27 (×3): qty 2

## 2020-08-27 MED ORDER — FENTANYL CITRATE (PF) 100 MCG/2ML IJ SOLN
INTRAMUSCULAR | Status: AC
  Filled 2020-08-27: qty 2

## 2020-08-27 MED ORDER — ATORVASTATIN CALCIUM 80 MG PO TABS
80.0000 mg | ORAL_TABLET | Freq: Every day | ORAL | Status: DC
  Administered 2020-08-27 – 2020-08-29 (×3): 80 mg via ORAL
  Filled 2020-08-27 (×3): qty 1

## 2020-08-27 MED ORDER — CALCITRIOL 0.25 MCG PO CAPS
0.2500 ug | ORAL_CAPSULE | ORAL | Status: DC
  Administered 2020-08-28: 0.25 ug via ORAL
  Filled 2020-08-27: qty 1

## 2020-08-27 MED ORDER — SODIUM CHLORIDE 0.9 % IV SOLN: Freq: Once | INTRAVENOUS | Status: DC

## 2020-08-27 MED ORDER — ONDANSETRON HCL 4 MG/2ML IJ SOLN
4.0000 mg | Freq: Four times a day (QID) | INTRAMUSCULAR | Status: DC | PRN
  Administered 2020-08-27: 4 mg via INTRAVENOUS
  Filled 2020-08-27: qty 2

## 2020-08-27 MED ORDER — SODIUM CHLORIDE 0.9 % IV SOLN: INTRAVENOUS | Status: DC

## 2020-08-27 MED ORDER — ALBUTEROL SULFATE HFA 108 (90 BASE) MCG/ACT IN AERS: 2.0000 | INHALATION_SPRAY | Freq: Four times a day (QID) | RESPIRATORY_TRACT | Status: DC | PRN

## 2020-08-27 MED ORDER — ORAL CARE MOUTH RINSE
15.0000 mL | Freq: Two times a day (BID) | OROMUCOSAL | Status: DC
  Administered 2020-08-27 – 2020-08-28 (×2): 15 mL via OROMUCOSAL

## 2020-08-27 MED ORDER — INSULIN ASPART 100 UNIT/ML ~~LOC~~ SOLN
0.0000 [IU] | SUBCUTANEOUS | Status: DC
  Administered 2020-08-27: 5 [IU] via SUBCUTANEOUS
  Administered 2020-08-28: 2 [IU] via SUBCUTANEOUS
  Administered 2020-08-29: 3 [IU] via SUBCUTANEOUS

## 2020-08-27 MED ORDER — FENTANYL CITRATE (PF) 100 MCG/2ML IJ SOLN
INTRAMUSCULAR | Status: DC | PRN
  Administered 2020-08-27: 50 ug via INTRAVENOUS

## 2020-08-27 MED ORDER — CARVEDILOL 12.5 MG PO TABS
12.5000 mg | ORAL_TABLET | Freq: Two times a day (BID) | ORAL | Status: DC
  Administered 2020-08-27 – 2020-08-29 (×4): 12.5 mg via ORAL
  Filled 2020-08-27 (×4): qty 1

## 2020-08-27 MED ORDER — METOPROLOL TARTRATE 5 MG/5ML IV SOLN: 2.0000 mg | INTRAVENOUS | Status: DC | PRN

## 2020-08-27 MED ORDER — ACETAMINOPHEN 325 MG PO TABS
650.0000 mg | ORAL_TABLET | Freq: Four times a day (QID) | ORAL | Status: DC | PRN
  Filled 2020-08-27: qty 2

## 2020-08-27 MED ORDER — HEPARIN (PORCINE) IN NACL 1000-0.9 UT/500ML-% IV SOLN
INTRAVENOUS | Status: AC
  Filled 2020-08-27: qty 1000

## 2020-08-27 MED ORDER — LEVOTHYROXINE SODIUM 112 MCG PO TABS
112.0000 ug | ORAL_TABLET | Freq: Every day | ORAL | Status: DC
  Administered 2020-08-27 – 2020-08-29 (×2): 112 ug via ORAL
  Filled 2020-08-27 (×3): qty 1

## 2020-08-27 MED ORDER — HEPARIN SODIUM (PORCINE) 1000 UNIT/ML IJ SOLN
INTRAMUSCULAR | Status: DC | PRN
  Administered 2020-08-27: 3000 [IU] via INTRAVENOUS

## 2020-08-27 MED ORDER — HEPARIN (PORCINE) 25000 UT/250ML-% IV SOLN
800.0000 [IU]/h | INTRAVENOUS | Status: DC
  Filled 2020-08-27: qty 250

## 2020-08-27 MED ORDER — ASPIRIN EC 81 MG PO TBEC
81.0000 mg | DELAYED_RELEASE_TABLET | Freq: Every day | ORAL | Status: DC
  Administered 2020-08-27 – 2020-08-29 (×3): 81 mg via ORAL
  Filled 2020-08-27 (×3): qty 1

## 2020-08-27 MED ORDER — MIDAZOLAM HCL 2 MG/2ML IJ SOLN: 1.0000 mg | INTRAMUSCULAR | Status: DC | PRN

## 2020-08-27 MED ORDER — SODIUM CHLORIDE 0.9% FLUSH: 3.0000 mL | INTRAVENOUS | Status: DC | PRN

## 2020-08-27 MED ORDER — MORPHINE SULFATE (PF) 2 MG/ML IV SOLN
2.0000 mg | Freq: Once | INTRAVENOUS | Status: AC
  Administered 2020-08-27: 2 mg via INTRAVENOUS
  Filled 2020-08-27: qty 1

## 2020-08-27 MED ORDER — SODIUM CHLORIDE 0.9 % IV SOLN: INTRAVENOUS | Status: AC

## 2020-08-27 MED ORDER — DEXTROSE 50 % IV SOLN
25.0000 mL | Freq: Once | INTRAVENOUS | Status: AC
  Administered 2020-08-27: 25 mL via INTRAVENOUS
  Filled 2020-08-27: qty 50

## 2020-08-27 MED ORDER — ONDANSETRON HCL 4 MG PO TABS: 4.0000 mg | ORAL_TABLET | Freq: Four times a day (QID) | ORAL | Status: DC | PRN

## 2020-08-27 MED ORDER — MIDAZOLAM HCL 2 MG/2ML IJ SOLN
INTRAMUSCULAR | Status: DC | PRN
  Administered 2020-08-27: 1 mg via INTRAVENOUS

## 2020-08-27 MED ORDER — LORATADINE 10 MG PO TABS
10.0000 mg | ORAL_TABLET | Freq: Every day | ORAL | Status: DC
  Administered 2020-08-28 – 2020-08-29 (×2): 10 mg via ORAL
  Filled 2020-08-27 (×3): qty 1

## 2020-08-27 MED ORDER — SODIUM CHLORIDE 0.9% FLUSH
3.0000 mL | Freq: Two times a day (BID) | INTRAVENOUS | Status: DC
  Administered 2020-08-27 – 2020-08-29 (×4): 3 mL via INTRAVENOUS

## 2020-08-27 MED ORDER — FUROSEMIDE 40 MG PO TABS
40.0000 mg | ORAL_TABLET | Freq: Every day | ORAL | Status: DC
  Administered 2020-08-28 – 2020-08-29 (×2): 40 mg via ORAL
  Filled 2020-08-27 (×2): qty 1

## 2020-08-27 SURGICAL SUPPLY — 14 items
CATH INFUS 135CMX50CM (CATHETERS) ×2 IMPLANT
CATH OMNI FLUSH 5F 65CM (CATHETERS) ×2 IMPLANT
CATH TEMPO AQUA 5F 100CM (CATHETERS) ×2 IMPLANT
FILTER CO2 0.2 MICRON (VASCULAR PRODUCTS) ×4 IMPLANT
GLIDEWIRE ADV .035X260CM (WIRE) ×2 IMPLANT
KIT MICROPUNCTURE NIT STIFF (SHEATH) ×2 IMPLANT
KIT PV (KITS) ×2 IMPLANT
RESERVOIR CO2 (VASCULAR PRODUCTS) ×2 IMPLANT
SET FLUSH CO2 (MISCELLANEOUS) ×2 IMPLANT
SHEATH FLEX ANSEL ANG 6F 45CM (SHEATH) ×2 IMPLANT
SHEATH PINNACLE 5F 10CM (SHEATH) ×2 IMPLANT
TRANSDUCER W/STOPCOCK (MISCELLANEOUS) ×2 IMPLANT
TRAY PV CATH (CUSTOM PROCEDURE TRAY) ×2 IMPLANT
WIRE STARTER BENTSON 035X150 (WIRE) ×2 IMPLANT

## 2020-08-27 NOTE — Progress Notes (Addendum)
Progress Note  Patient Name: [name] Date of Encounter: 08/27/2020  [facility] Cardiologist: [name], MD   Subjective   C/o right LE pain but better controlled this morning.  Inpatient Medications    Scheduled Meds: . amLODipine  10 mg Oral Daily  . aspirin EC  81 mg Oral Daily  . atorvastatin  80 mg Oral Daily  . [START ON 08/28/2020] calcitRIOL  0.25 mcg Oral Q M,W,F  . carvedilol  12.5 mg Oral BID WC  . famotidine  40 mg Oral Daily  . [START ON 08/28/2020] furosemide  40 mg Oral Daily  . insulin aspart  0-15 Units Subcutaneous Q4H  . insulin glargine  15 Units Subcutaneous QHS  . levothyroxine  112 mcg Oral QAC breakfast  . loratadine  10 mg Oral Daily  . PARoxetine  60 mg Oral QHS   Continuous Infusions: . heparin 1,000 Units/hr (08/26/20 2134)   PRN Meds: acetaminophen, acetaminophen-codeine, albuterol, ALPRAZolam, ondansetron **OR** ondansetron (ZOFRAN) IV, tiZANidine   Vital Signs    Vitals:   08/27/20 0630 08/27/20 0645 08/27/20 0700 08/27/20 0715  BP: 133/65 139/74 (!) 141/67 (!) 149/64  Pulse: 67 69 68 68  Resp: 16 17 16 17   Temp:      TempSrc:      SpO2: 96% 94% 95% 96%  Weight:      Height:       No intake or output data in the 24 hours ending 08/27/20 0846 Last 3 Weights 08/26/2020 03/10/2020 09/10/2019  Weight (lbs) 129 lb 9.6 oz 143 lb 152 lb  Weight (kg) 58.786 kg 64.864 kg 68.947 kg  Some encounter information is confidential and restricted. Go to Review Flowsheets activity to see all data.      Telemetry    SR - Personally Reviewed  Physical Exam   GEN: No acute distress.   Neck: No JVD Cardiac: RRR, no murmurs, rubs, or gallops.  Respiratory: Clear to auscultation bilaterally. GI: Soft, nontender, non-distended  MS: No edema; RLE with discoloration mottled look of toes, right foot is erythematous  Neuro:  Nonfocal  Psych: Normal affect   Labs    High Sensitivity Troponin:  No results for input(s): TROPONINIHS in  the last 720 hours.    Chemistry Recent Labs  Lab 08/26/20 1852 08/27/20 0407  NA 127* 134*  K 4.7 4.0  CL 90* 99  CO2 24 24  GLUCOSE 409* 276*  BUN 39* 37*  CREATININE 2.30* 2.00*  CALCIUM 9.3 8.9  PROT 6.7  --   ALBUMIN 2.7*  --   AST 15  --   ALT 11  --   ALKPHOS 71  --   BILITOT 0.8  --   GFRNONAA 23* 27*  ANIONGAP 13 11     Hematology Recent Labs  Lab 08/26/20 1852  WBC 12.8*  RBC 4.08  HGB 12.0  HCT 36.5  MCV 89.5  MCH 29.4  MCHC 32.9  RDW 13.0  PLT 308    BNPNo results for input(s): BNP, PROBNP in the last 168 hours.   DDimer No results for input(s): DDIMER in the last 168 hours.   Radiology    VAS [place] ABI WITH/WO TBI  Result Date: 08/26/2020 LOWER EXTREMITY DOPPLER STUDY Indications: Claudication, rest pain, peripheral artery disease, and Patient              fell and hurt her right ankle 08/14/2020 and started having pain  going up the calf. The right 1st and 2nd toes are purple in color.              She has rest pain all day and night. She is currently wearing an              orthopedic boot. High Risk Factors: Hypertension, hyperlipidemia, Diabetes, past history of                    smoking, coronary artery disease. Other Factors: SEE BILATERAL LEG ARTERIAL DUPLEX REPORT.  Vascular Interventions: Right FEM-POP BPG 02/23/2017                         Left FEM-POP BPG 11/23/2016. Comparison Study: Prior ABI 09/03/2019 Right 1.17 Left 1.20 Performing Technologist: Salvadore Dom RVT  Examination Guidelines: A complete evaluation includes at minimum, Doppler waveform signals and systolic blood pressure reading at the level of bilateral brachial, anterior tibial, and posterior tibial arteries, when vessel segments are accessible. Bilateral testing is considered an integral part of a complete examination. Photoelectric Plethysmograph (PPG) waveforms and toe systolic pressure readings are included as required and additional duplex testing as needed. Limited  examinations for reoccurring indications may be performed as noted.  ABI Findings: +---------+------------------+-----+-------------------+--------+ Right    Rt Pressure (mmHg)IndexWaveform           Comment  +---------+------------------+-----+-------------------+--------+ Brachial 174                                                +---------+------------------+-----+-------------------+--------+ ATA      49                0.28 dampened monophasic         +---------+------------------+-----+-------------------+--------+ PTA      49                0.28 dampened monophasic         +---------+------------------+-----+-------------------+--------+ PERO                            absent                      +---------+------------------+-----+-------------------+--------+ Great Toe0                 0.00 Absent                      +---------+------------------+-----+-------------------+--------+ +---------+------------------+-----+----------+-------+ Left     Lt Pressure (mmHg)IndexWaveform  Comment +---------+------------------+-----+----------+-------+ Brachial 172                                      +---------+------------------+-----+----------+-------+ ATA      15                0.09 biphasic          +---------+------------------+-----+----------+-------+ PTA      146               0.84 biphasic          +---------+------------------+-----+----------+-------+ PERO     127               0.73 monophasic        +---------+------------------+-----+----------+-------+  Great Toe120               0.69 Abnormal          +---------+------------------+-----+----------+-------+ +-------+-----------+-----------+------------+------------+ ABI/TBIToday's ABIToday's TBIPrevious ABIPrevious TBI +-------+-----------+-----------+------------+------------+ Right  .28        0          1.17        .90           +-------+-----------+-----------+------------+------------+ Left   .88        .69        1.20        1.22         +-------+-----------+-----------+------------+------------+ TOES Findings: +----------+---------------+--------+-------+ Right ToesPressure (mmHg)WaveformComment +----------+---------------+--------+-------+ 1st Digit                Absent          +----------+---------------+--------+-------+ 2nd Digit                Absent          +----------+---------------+--------+-------+ 3rd Digit                Absent          +----------+---------------+--------+-------+ 4th Digit                Absent          +----------+---------------+--------+-------+ 5th Digit                Absent          +----------+---------------+--------+-------+  +---------+---------------+--------+-------+ Left ToesPressure (mmHg)WaveformComment +---------+---------------+--------+-------+ 1st Digit               Abnormal        +---------+---------------+--------+-------+ 2nd Digit               Abnormal        +---------+---------------+--------+-------+ 3rd Digit               Abnormal        +---------+---------------+--------+-------+ 4th Digit               Abnormal        +---------+---------------+--------+-------+ 5th Digit               Abnormal        +---------+---------------+--------+-------+   Bilateral ABIs and TBIs appear decreased compared to prior study on 09/03/2019.  Findings reported to Dr. Tyrell Antonio email through [facility] and [place], RN in [place] at 4:40 pm . Summary: Right: Resting right ankle-brachial index indicates critical limb ischemia. The right toe-brachial index is abnormal. Left: Resting left ankle-brachial index indicates mild left lower extremity arterial disease. The left toe-brachial index is abnormal.  *See table(s) above for measurements and observations.  Vascular consult recommended. Electronically signed by Ida Rogue MD on  08/26/2020 at 7:11:09 PM.    Final    VAS [place] LOWER EXTREMITY ARTERIAL DUPLEX  Result Date: 08/26/2020 LOWER EXTREMITY ARTERIAL DUPLEX STUDY Indications: Claudication, rest pain, and Patient fell and hurt her right ankle              08/14/2020 and started having pain going up the calf. The right 1st              and 2nd toes are purple in color. She has rest pain all day and              night. She is currently wearing an orthopedic boot. High Risk Factors: Hypertension, hyperlipidemia, Diabetes, past history of  smoking, coronary artery disease. Other Factors: SEE ABI REPORT.  Vascular Interventions: Right FEM-POP BPG 02/23/2017                         Left FEM-POP BPG 11/23/2016. Current ABI:            Right .28 Left .65 Comparison Study: Prior bilateral leg arterial duplex exam on 09/03/2019 Right                   highest velocity 124 cm/s at proximal anastomosis. Left                   proximal anastomosis 134 cm/s. Performing Technologist: Salvadore Dom RVT, RDCS (AE), RDMS  Examination Guidelines: A complete evaluation includes B-mode imaging, spectral Doppler, color Doppler, and power Doppler as needed of all accessible portions of each vessel. Bilateral testing is considered an integral part of a complete examination. Limited examinations for reoccurring indications may be performed as noted.  +-----------+--------+-----+--------+-------------------+--------+ RIGHT      PSV cm/sRatioStenosisWaveform           Comments +-----------+--------+-----+--------+-------------------+--------+ CFA Prox   82                   barely triphasic            +-----------+--------+-----+--------+-------------------+--------+ CFA Distal 81                   monophasic                  +-----------+--------+-----+--------+-------------------+--------+ DFA        165                  monophasic                  +-----------+--------+-----+--------+-------------------+--------+ ATA  Prox   18                   dampened monophasic         +-----------+--------+-----+--------+-------------------+--------+ ATA Mid    19                   monophasic                  +-----------+--------+-----+--------+-------------------+--------+ ATA Distal 13                   monophasic                  +-----------+--------+-----+--------+-------------------+--------+ PTA Prox   25                   monophasic                  +-----------+--------+-----+--------+-------------------+--------+ PTA Mid    22                   monophasic                  +-----------+--------+-----+--------+-------------------+--------+ PTA Distal 15                   monophasic                  +-----------+--------+-----+--------+-------------------+--------+ PERO Prox  8                    dampened monophasic         +-----------+--------+-----+--------+-------------------+--------+ PERO Mid   8  monophasic                  +-----------+--------+-----+--------+-------------------+--------+ PERO Distal8                    monophasic                  +-----------+--------+-----+--------+-------------------+--------+ Right pedal artery 269 ms, category 4 severe ischemia.  Right Graft #1: FEM-POP BPG +------------------+--------+--------+----------+--------+                   PSV cm/sStenosisWaveform  Comments +------------------+--------+--------+----------+--------+ Inflow            81              monophasic         +------------------+--------+--------+----------+--------+ Prox Anastomosis          occluded                   +------------------+--------+--------+----------+--------+ Proximal Graft            occluded                   +------------------+--------+--------+----------+--------+ Mid Graft                 occluded                   +------------------+--------+--------+----------+--------+ Distal Graft               occluded                   +------------------+--------+--------+----------+--------+ Distal Anastomosis28              monophasic         +------------------+--------+--------+----------+--------+ Outflow           29              monophasic         +------------------+--------+--------+----------+--------+ OCCLUDED BPG  +----------+--------+-----+--------+----------------+--------+ LEFT      PSV cm/sRatioStenosisWaveform        Comments +----------+--------+-----+--------+----------------+--------+ CFA Prox  210                  biphasic                 +----------+--------+-----+--------+----------------+--------+ CFA Distal148                  biphasic                 +----------+--------+-----+--------+----------------+--------+ DFA       103                  barely triphasic         +----------+--------+-----+--------+----------------+--------+ SFA Prox  148                                           +----------+--------+-----+--------+----------------+--------+ TP Trunk  161                  biphasic                 +----------+--------+-----+--------+----------------+--------+  Left Graft #1: FEM-POP BPG +--------------------+--------+--------+--------+--------+                     PSV cm/sStenosisWaveformComments +--------------------+--------+--------+--------+--------+ Inflow              148  biphasic         +--------------------+--------+--------+--------+--------+ Proximal Anastomosis160             biphasic         +--------------------+--------+--------+--------+--------+ Proximal Graft      48              biphasic         +--------------------+--------+--------+--------+--------+ Mid Graft           69              biphasic         +--------------------+--------+--------+--------+--------+ Distal Graft        51              biphasic          +--------------------+--------+--------+--------+--------+ Distal Anastomosis  139             biphasic         +--------------------+--------+--------+--------+--------+ Outflow             161             biphasicTPT      +--------------------+--------+--------+--------+--------+ Patent BPG, no stenosis seen.  Findings reported to Dr. Tyrell Antonio email through [facility] and [place], RN in [place] at 4:40 pm.  Summary: Right: Severe progression is noted compared to previous study. Occluded FEM-POP BPG. Left: No significant change as compared to previous study. Patent FEM-POP BPG.  See table(s) above for measurements and observations. Vascular consult recommended. Electronically signed by Ida Rogue MD on 08/26/2020 at 72:08:56 PM.    Final     Cardiac Studies   TTE 2018: - Left ventricle: The cavity size was normal. Wall thickness was  normal. Systolic function was normal. The estimated ejection  fraction was in the range of 55% to 60%. Wall motion was normal;  there were no regional wall motion abnormalities. Doppler  parameters are consistent with both elevated ventricular  end-diastolic filling pressure and elevated left atrial filling  pressure.  - Mitral valve: Calcified annulus.  - Left atrium: The atrium was mildly dilated.  - Atrial septum: No defect or patent foramen ovale was identified.  - Pulmonary arteries: PA peak pressure: 46 mm Hg (S).   Patient Profile     65 y.o. female with a history of CAD s/p CABG in 2004, type 2 DM with diabetic neuropathy, CKD, moderate carotid stenosis, and PAD s/pleft external iliac/common femoral and profunda endarterectomy with extended profundoplasty, ligation of the left superficial femoral artery and femoral to below the knee popliteal artery bypass with a vein graft in 05/18, and more recently right femoral to below-knee popliteal artery bypass in August 2018 for critical limb ischemia. She had GI bleed on small dose Xarelto which  was started for peripheral arterial disease. EGD was unremarkable. She was taken off Xarelto. She had a significant drop in ABI on the left side in 2019. Angiography in August 2019 showed patent left femoral to below the knee popliteal artery bypass with discrete 80% stenosis in the mid to distal segment. This was treated successfully with drug-coated balloon angioplasty. She presents due to rest pain of her right lower extremity associated with a severe drop in ABI to 0.28.  Assessment & Plan      1. PAD with critical limb ischemia of the RLE, occluded femoral-popliteal bypass graft: Presented with worsening pain in RLE with discoloration and severe decline in ABIs. Vascular surgery consulted and seen by Dr. Arita Miss. Planned for PV angiogram this morning -- on  IV heparin, statin and ASA -- NPO  2. Hyponatremia: Na+ 127>>134 this morning -- treated with gentle IVFs overnight -- lasix held in the setting of need for angiogram -- no volume overload on exam  3. CKD stage IV: Cr 2.3>>2.0 this morning. Received IVFs overnight.  -- no ACE/ARB with renal dysfunction -- Holding lasix for now, resume post-op  4. Type 2 diabetes with peripheral neuropathy and severe hyperglycemia: Hgb A1c 12.2. Home regimen 20-60 units BID per sliding scale? -- on SSI and lantus -- diabetes consult, will need much better control over A1c in the future  5. Hypertension: liable -- continue Carvedilol 12.5 BID and amodipine 10mg  (home medications)  6. CAD s/p CABG: 4v CABG in 2004, she has no angina -- Last EF in 2018 was normal. Last LDL was at goal. -- Continue aspirin, statin, and beta blocker  For questions or updates, please contact [place] Please consult [email or web address] for contact info under        Signed, Reino Bellis, NP  08/27/2020, 8:46 AM    Agree with note by Reino Bellis NP-C  Patient mated with critical limb ischemia secondary to an occluded femoropopliteal bypass graft  demonstrated by duplex ultrasound and Dr. Tyrell Antonio office.  ABI went down to 0.28.  Scheduled for PV angiogram by Dr. Carlis Abbott this morning using CO2 primarily and limited dye given chronic renal insufficiency and a serum creatinine in the 2 range.  Other problems as outlined are stable.   Lorretta Harp, M.D., [place], [facility], [place] [address] [place], [place]  [address]  [phone] 08/27/2020 9:35 AM

## 2020-08-27 NOTE — Progress Notes (Addendum)
ANTICOAGULATION CONSULT NOTE - Follow Up Consult  Pharmacy Consult for heparin Indication: ischemic limb  Labs: Recent Labs    08/26/20 1852 08/27/20 0407 08/27/20 0830 08/27/20 1710 08/27/20 1931 08/27/20 2233  HGB 12.0  --   --  12.8  --  11.5*  HCT 36.5  --   --  38.4  --  34.5*  PLT 308  --   --  291  --  269  HEPARINUNFRC  --   --  0.63  --  0.50 0.43  CREATININE 2.30* 2.00*  --   --   --   --     Assessment/Plan:  65yo female therapeutic on heparin with concurrent lytics. Will continue gtt at current rate and confirm stable with additional level.   Wynona Neat, PharmD, BCPS  08/27/2020,11:13 PM   Addendum: Heparin level remains therapeutic at 0.36.  VB 08/28/2020 3:19 AM

## 2020-08-27 NOTE — H&P (Signed)
Cardiology History & Physical    Patient ID: [name] MRN: [id number], DOB/AGE: 66-Dec-1957   Admit date: 08/26/2020  Primary Physician: Nicholes Rough, PA-C Primary Cardiologist: [name], MD  Patient Profile    [name] is a 64 y.o. woman with a history of CAD s/p CABG in 2004, type 2 DM with diabetic neuropathy, CKD, moderate carotid stenosis, and PAD s/p left external iliac/common femoral and profunda endarterectomy with extended profundoplasty, ligation of the left superficial femoral artery and femoral to below the knee popliteal artery bypass with a vein graft in 05/18, and more recently right femoral to below-knee popliteal artery bypass in August 2018 for critical limb ischemia. She had GI bleed on small dose Xarelto which was started for peripheral arterial disease.  EGD was unremarkable.  She was taken off Xarelto.  She had a significant drop in ABI on the left side in 2019.  Angiography in August 2019 showed patent left femoral to below the knee popliteal artery bypass with discrete 80% stenosis in the mid to distal segment.  This was treated successfully with drug-coated balloon angioplasty. She presents due to rest pain of her right lower extremity associated with a severe drop in ABI to 0.28.  History of Present Illness    The was seen in urgent care 2/4 with right ankle pain after tripping over a rug. X-rays at the time were negative and she was diagnosed with an ankle sprain. Her foot has gradually been getting worse, and she was seen by ortho earlier this week. Her foot is now cold to the touch with discoloration of her toes, painful both at rest and when she ambulates. No wounds or fevers. She called the office of Dr. Fletcher Anon 2 days ago due to the above complaints and Doppler studies were ordered which showed severe drop in ABI to 0.28 on the right with evidence of occluded femoral-popliteal bypass graft. She was advised to go to the ED due to concern for critical limb  ischemia.   While in the ED, she has been hypertensive with otherwise normal VS. Labs are normal for hyponatremia (Na 127, Cl 90), Cr 2.3 (most recently 2 in Aug 2021), glucose 409, WBC 12.8. She has been seen by vascular surgery and started on heparin infusion as well as saline at 100 mL/hr. She is planned for peripheral angiography in the morning.  Past Medical History   Past Medical History:  Diagnosis Date  . Adenomatous polyp of colon   . Anemia   . Arthritis   . Asthma   . Carotid artery disease ([place])   . Carpal tunnel syndrome   . Chronic renal insufficiency    Stage 3 Kidney disease  . Coronary artery disease   . Depression   . Depression with anxiety   . Diabetes mellitus    type II  . Diverticulosis of colon (without mention of hemorrhage)   . GERD (gastroesophageal reflux disease)   . Heart murmur   . History of hiatal hernia   . Hyperlipidemia   . Hypertension   . Hypothyroidism   . Obesity   . PVD (peripheral vascular disease) ([place])   . Thyroid disease    hypo    Past Surgical History:  Procedure Laterality Date  . ABDOMINAL AORTAGRAM N/A 12/18/2013   Procedure: ABDOMINAL [name];  Surgeon: Wellington Hampshire, MD;  Location: [place] CATH LAB;  Service: Cardiovascular;  Laterality: N/A;  . ABDOMINAL AORTOGRAM W/LOWER EXTREMITY N/A 11/16/2016   Procedure: Abdominal  Aortogram w/Lower Extremity;  Surgeon: Wellington Hampshire, MD;  Location: [place] CV LAB;  Service: Cardiovascular;  Laterality: N/A;  . ABDOMINAL AORTOGRAM W/LOWER EXTREMITY N/A 01/25/2017   Procedure: Abdominal Aortogram w/Lower Extremity;  Surgeon: Wellington Hampshire, MD;  Location: [place] CV LAB;  Service: Cardiovascular;  Laterality: N/A;  only completed Lower Extremity  . ABDOMINAL AORTOGRAM W/LOWER EXTREMITY N/A 02/28/2018   Procedure: ABDOMINAL AORTOGRAM W/LOWER EXTREMITY;  Surgeon: Wellington Hampshire, MD;  Location: [place] CV LAB;  Service: Cardiovascular;  Laterality: N/A;  . ABDOMINAL  HYSTERECTOMY    . APPENDECTOMY    . APPLICATION OF WOUND VAC Right 02/13/2017   Procedure: APPLICATION OF INCISIONAL WOUND VAC RIGHT GROIN;  Surgeon: Elam Dutch, MD;  Location: [place];  Service: Vascular;  Laterality: Right;  . CHOLECYSTECTOMY    . CORONARY ARTERY BYPASS GRAFT  2004   x4  . ENDARTERECTOMY FEMORAL Left 11/23/2016   Procedure: ENDARTERECTOMY OF LEFT EXTERNAL COMMON FEMORAL ARTERY WITH EXTENDED LEFT PROFUNDOPLASTY;  Surgeon: Elam Dutch, MD;  Location: [place];  Service: Vascular;  Laterality: Left;  . ENDARTERECTOMY FEMORAL Right 02/13/2017   Procedure: ENDARTERECTOMY RIGHT FEMORAL ARTERY WITH PROFUNDAPLASTY;  Surgeon: Elam Dutch, MD;  Location: [facility] OR;  Service: Vascular;  Laterality: Right;  . ESOPHAGOGASTRODUODENOSCOPY N/A 08/02/2017   Procedure: ESOPHAGOGASTRODUODENOSCOPY (EGD);  Surgeon: Ladene Artist, MD;  [facility];  Service: Endoscopy;  Laterality: N/A;  . FEMORAL-POPLITEAL BYPASS GRAFT Left 11/23/2016   Procedure: LEFT FEMORAL-BELOW KNEE POPLITEAL ARTERY BYPASS;  Surgeon: Elam Dutch, MD;  Location: [place];  Service: Vascular;  Laterality: Left;  . FEMORAL-POPLITEAL BYPASS GRAFT Right 02/13/2017   Procedure: BYPASS GRAFT RIGHT FEMORAL-BELOW KNEE POPLITEAL ARTERY;  Surgeon: Elam Dutch, MD;  Location: [place];  Service: Vascular;  Laterality: Right;  . KNEE SURGERY Right 2013   arthroscopy  . PATCH ANGIOPLASTY Right 02/13/2017   Procedure: VEIN PATCH ANGIOPLASTY RIGHT POPLITEAL ARTERY AND HEMASHIELD PATCH ANGIOPLASTY OF RIGHT FEMORAL ARTERY;  Surgeon: Elam Dutch, MD;  Location: [place];  Service: Vascular;  Laterality: Right;  . PERIPHERAL VASCULAR BALLOON ANGIOPLASTY  02/28/2018   Procedure: PERIPHERAL VASCULAR BALLOON ANGIOPLASTY;  Surgeon: Wellington Hampshire, MD;  Location: [place] CV LAB;  Service: Cardiovascular;;  Left Fem-pop Bypass     Allergies No Known Allergies  Home Medications    Prior to Admission medications    Medication Sig Start Date End Date Taking? Authorizing Provider  acetaminophen-codeine (TYLENOL #3) 300-30 MG tablet Take 1-2 tablets by mouth every 6 (six) hours as needed for moderate pain. 08/14/20  Yes Faustino Congress, NP  albuterol (PROVENTIL HFA;VENTOLIN HFA) 108 (90 BASE) MCG/ACT inhaler Inhale 2 puffs into the lungs every 6 (six) hours as needed for wheezing or shortness of breath.   Yes [provider]  ALPRAZolam Duanne Moron) 1 MG tablet Take 1 mg by mouth 3 (three) times daily as needed for anxiety. 05/11/11  Yes [provider]  amLODipine (NORVASC) 10 MG tablet Take 10 mg by mouth daily. 05/16/19  Yes [provider]  aspirin EC 81 MG tablet Take 81 mg by mouth at bedtime.   Yes [provider]  atorvastatin (LIPITOR) 80 MG tablet Take 80 mg by mouth daily.    Yes [provider]  calcitRIOL (ROCALTROL) 0.25 MCG capsule Take 0.25 mcg by mouth every Monday, Wednesday, and Friday.  07/02/17  Yes [provider]  carvedilol (COREG) 12.5 MG tablet TAKE 1 TABLET BY MOUTH  TWICE A DAY Patient taking differently: Take 12.5 mg by mouth 2 (two) times daily with a meal. 06/15/20  Yes Wellington Hampshire, MD  cetirizine (ZYRTEC) 10 MG tablet Take 10 mg by mouth daily. 06/30/17  Yes [provider]  famotidine (PEPCID) 40 MG tablet TAKE 1 TABLET BY MOUTH EVERY DAY Patient taking differently: Take 40 mg by mouth daily. 05/01/20  Yes Wellington Hampshire, MD  fenofibrate micronized (LOFIBRA) 200 MG capsule Take 200 mg by mouth daily.   Yes [provider]  ferrous sulfate 325 (65 FE) MG tablet Take 325 mg by mouth 3 (three) times daily. 05/10/15  Yes [provider]  furosemide (LASIX) 40 MG tablet Take 40 mg by mouth daily.  05/08/11  Yes [provider]  Insulin Glargine (BASAGLAR KWIKPEN) 100 UNIT/ML SOPN Inject 20-60 Units into the skin 2 (two) times daily before a meal. Per sliding scale 01/26/17  Yes [provider]  Levothyroxine Sodium 112 MCG CAPS Take 112 mcg by mouth daily before breakfast. 12/09/19 12/08/20 Yes [provider]  Omega-3 Fatty Acids (FISH OIL) 1000 MG CAPS Take 1,000 mg by mouth 2 (two) times daily.   Yes [provider]  PARoxetine (PAXIL) 40 MG tablet Take 60 mg by mouth at bedtime.    Yes [provider]  promethazine (PHENERGAN) 25 MG tablet Take 25 mg by mouth daily as needed for nausea or vomiting.  08/06/16  Yes [provider]  tiZANidine (ZANAFLEX) 4 MG tablet Take 1 tablet (4 mg total) by mouth every 6 (six) hours as needed for muscle spasms. 08/14/20  Yes Faustino Congress, NP  Vitamin D, Ergocalciferol, (DRISDOL) 50000 UNITS CAPS capsule Take 50,000 Units by mouth 2 (two) times a week. Takes on Mondays and Fridays   Yes [provider]    Family History    Family History  Problem Relation Age of Onset  . Diabetes Mother   . Heart disease Father   . Colon polyps Daughter   . Colon cancer Neg Hx   . Esophageal cancer Neg Hx   . Stomach cancer Neg Hx   . Rectal cancer Neg Hx    She indicated that her mother is deceased. She indicated that her father is deceased. She indicated that her maternal grandmother is deceased. She indicated that her maternal grandfather is deceased. She indicated that her paternal grandmother is deceased. She indicated that her paternal grandfather is deceased. She indicated that the status of her daughter is unknown. She indicated that the status of her neg hx is unknown.   Social History    Social History   Socioeconomic History  . Marital status: Widowed    Spouse name: Not on file  . Number of children: 2  . Years of education: Not on file  . Highest education level: Not on file  Occupational History  . 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 Smoking status: Former Smoker    Packs/day: 1.00    Years: 30.00    Pack  years: 30.00    Types: Cigarettes    Quit date: 2018    Years since quitting: 4.1  . Smokeless tobacco: Never Used  Vaping Use  . Vaping Use: Never used  Substance and Sexual Activity  . Alcohol use: No    Alcohol/week: 0.0 standard drinks  . Drug use: No  . Sexual activity: Not on file  Other Topics Concern  . Not on file  Social History Narrative   Daily Caffeine   Social Determinants of Health   Financial Resource Strain: Not on file  Food Insecurity: Not on file  Transportation Needs: Not on file  Physical Activity: Not on file  Stress: Not on file  Social Connections: Not on file  Intimate Partner Violence: Not on file     Review of Systems    A comprehensive review of systems was performed with pertinent positives and negatives noted in the HPI.  Physical Exam    BP (!) 162/69   Pulse 66   Temp (!) 97.5 F (36.4 C) (Oral)   Resp 16   Ht 5\' 4"  (1.626 m)   Wt 58.8 kg   SpO2 97%   BMI 22.25 kg/m  General: Alert, appears frail and underweight, no acute distress HEENT: symmetric, no scleral icterus, MMM  Neck: symmetric, JVP appears normal Lungs:  Resp regular and unlabored, CTA bilaterally. Heart: Regular rhythm, no s3, s4, or murmurs. Pulses: Right PT intermittent and thready, Right DP absent, Left DP palpable, Left PT intermittently palpable.  Abdomen: Soft, non-tender, non-distended, BS +.  MSK: there is marked muscular atrophy of both lower extremities, decreased ROM of right foot > left, right foot is mildly tender Skin: discoloration and mottling of right toes (particularly 1-3), right foot is mildly erythematous, no wounds Neuro: Alert and oriented. No gross focal deficits. No abnormal movements.  Labs    Lab Results  Component Value Date   WBC 12.8 (H) 08/26/2020   HGB 12.0 08/26/2020   HCT 36.5 08/26/2020   MCV 89.5 08/26/2020   PLT 308 08/26/2020    Recent Labs  Lab 08/26/20 1852  NA 127*  K 4.7  CL 90*  CO2 24  BUN 39*  CREATININE  2.30*  CALCIUM 9.3  PROT 6.7  BILITOT 0.8  ALKPHOS 71  ALT 11  AST 15  GLUCOSE 409*   Lab Results  Component Value Date   CHOL 125 03/10/2020   HDL 46 03/10/2020   LDLCALC 54 03/10/2020   TRIG 144 03/10/2020   No results found for: [facility]   Radiology Studies    DG Ankle Complete Right  Result Date: 08/14/2020 CLINICAL DATA:  Recent fall.  Pain EXAM: RIGHT ANKLE - COMPLETE 3+ VIEW COMPARISON:  None. FINDINGS: Normal alignment no fracture. Mild degenerative change in the midfoot. Mild calcaneal spurring. Arterial calcification. IMPRESSION: Negative for fracture. Electronically Signed   By: Franchot Gallo M.D.   On: 08/14/2020 11:12   VAS [place] ABI WITH/WO TBI  Result Date: 08/26/2020 LOWER EXTREMITY DOPPLER STUDY Indications: Claudication, rest pain, peripheral artery disease, and Patient              fell and hurt her right ankle 08/14/2020 and started having pain              going up the calf. The right 1st and 2nd toes are purple in color.              She has rest pain all day and night. She is currently wearing an              orthopedic boot. High Risk Factors: Hypertension, hyperlipidemia, Diabetes, past history of                    smoking, coronary artery disease. Other Factors: SEE BILATERAL LEG ARTERIAL DUPLEX REPORT.  Vascular Interventions: Right FEM-POP BPG 02/23/2017  Left FEM-POP BPG 11/23/2016. Comparison Study: Prior ABI 09/03/2019 Right 1.17 Left 1.20 Performing Technologist: Salvadore Dom RVT  Examination Guidelines: A complete evaluation includes at minimum, Doppler waveform signals and systolic blood pressure reading at the level of bilateral brachial, anterior tibial, and posterior tibial arteries, when vessel segments are accessible. Bilateral testing is considered an integral part of a complete examination. Photoelectric Plethysmograph (PPG) waveforms and toe systolic pressure readings are included as required and additional duplex testing as needed.  Limited examinations for reoccurring indications may be performed as noted.  ABI Findings: +---------+------------------+-----+-------------------+--------+ Right    Rt Pressure (mmHg)IndexWaveform           Comment  +---------+------------------+-----+-------------------+--------+ Brachial 174                                                +---------+------------------+-----+-------------------+--------+ ATA      49                0.28 dampened monophasic         +---------+------------------+-----+-------------------+--------+ PTA      49                0.28 dampened monophasic         +---------+------------------+-----+-------------------+--------+ PERO                            absent                      +---------+------------------+-----+-------------------+--------+ Great Toe0                 0.00 Absent                      +---------+------------------+-----+-------------------+--------+ +---------+------------------+-----+----------+-------+ Left     Lt Pressure (mmHg)IndexWaveform  Comment +---------+------------------+-----+----------+-------+ Brachial 172                                      +---------+------------------+-----+----------+-------+ ATA      15                0.09 biphasic          +---------+------------------+-----+----------+-------+ PTA      146               0.84 biphasic          +---------+------------------+-----+----------+-------+ PERO     127               0.73 monophasic        +---------+------------------+-----+----------+-------+ Great Toe120               0.69 Abnormal          +---------+------------------+-----+----------+-------+ +-------+-----------+-----------+------------+------------+ ABI/TBIToday's ABIToday's TBIPrevious ABIPrevious TBI +-------+-----------+-----------+------------+------------+ Right  .28        0          1.17        .90           +-------+-----------+-----------+------------+------------+ Left   .88        .69        1.20        1.22         +-------+-----------+-----------+------------+------------+ TOES Findings: +----------+---------------+--------+-------+ Right ToesPressure (mmHg)WaveformComment +----------+---------------+--------+-------+ 1st Digit  Absent          +----------+---------------+--------+-------+ 2nd Digit                Absent          +----------+---------------+--------+-------+ 3rd Digit                Absent          +----------+---------------+--------+-------+ 4th Digit                Absent          +----------+---------------+--------+-------+ 5th Digit                Absent          +----------+---------------+--------+-------+  +---------+---------------+--------+-------+ Left ToesPressure (mmHg)WaveformComment +---------+---------------+--------+-------+ 1st Digit               Abnormal        +---------+---------------+--------+-------+ 2nd Digit               Abnormal        +---------+---------------+--------+-------+ 3rd Digit               Abnormal        +---------+---------------+--------+-------+ 4th Digit               Abnormal        +---------+---------------+--------+-------+ 5th Digit               Abnormal        +---------+---------------+--------+-------+   Bilateral ABIs and TBIs appear decreased compared to prior study on 09/03/2019.  Findings reported to Dr. Tyrell Antonio email through [facility] and [place], RN in [place] at 4:40 pm . Summary: Right: Resting right ankle-brachial index indicates critical limb ischemia. The right toe-brachial index is abnormal. Left: Resting left ankle-brachial index indicates mild left lower extremity arterial disease. The left toe-brachial index is abnormal.  *See table(s) above for measurements and observations.  Vascular consult recommended. Electronically signed by Ida Rogue MD on  08/26/2020 at 7:11:09 PM.    Final    VAS [place] LOWER EXTREMITY ARTERIAL DUPLEX  Result Date: 08/26/2020 LOWER EXTREMITY ARTERIAL DUPLEX STUDY Indications: Claudication, rest pain, and Patient fell and hurt her right ankle              08/14/2020 and started having pain going up the calf. The right 1st              and 2nd toes are purple in color. She has rest pain all day and              night. She is currently wearing an orthopedic boot. High Risk Factors: Hypertension, hyperlipidemia, Diabetes, past history of                    smoking, coronary artery disease. Other Factors: SEE ABI REPORT.  Vascular Interventions: Right FEM-POP BPG 02/23/2017                         Left FEM-POP BPG 11/23/2016. Current ABI:            Right .28 Left .8 Comparison Study: Prior bilateral leg arterial duplex exam on 09/03/2019 Right                   highest velocity 124 cm/s at proximal anastomosis. Left                   proximal anastomosis 134 cm/s. Performing  Technologist: Salvadore Dom RVT, RDCS (AE), RDMS  Examination Guidelines: A complete evaluation includes B-mode imaging, spectral Doppler, color Doppler, and power Doppler as needed of all accessible portions of each vessel. Bilateral testing is considered an integral part of a complete examination. Limited examinations for reoccurring indications may be performed as noted.  +-----------+--------+-----+--------+-------------------+--------+ RIGHT      PSV cm/sRatioStenosisWaveform           Comments +-----------+--------+-----+--------+-------------------+--------+ CFA Prox   82                   barely triphasic            +-----------+--------+-----+--------+-------------------+--------+ CFA Distal 81                   monophasic                  +-----------+--------+-----+--------+-------------------+--------+ DFA        165                  monophasic                  +-----------+--------+-----+--------+-------------------+--------+ ATA  Prox   18                   dampened monophasic         +-----------+--------+-----+--------+-------------------+--------+ ATA Mid    19                   monophasic                  +-----------+--------+-----+--------+-------------------+--------+ ATA Distal 13                   monophasic                  +-----------+--------+-----+--------+-------------------+--------+ PTA Prox   25                   monophasic                  +-----------+--------+-----+--------+-------------------+--------+ PTA Mid    22                   monophasic                  +-----------+--------+-----+--------+-------------------+--------+ PTA Distal 15                   monophasic                  +-----------+--------+-----+--------+-------------------+--------+ PERO Prox  8                    dampened monophasic         +-----------+--------+-----+--------+-------------------+--------+ PERO Mid   8                    monophasic                  +-----------+--------+-----+--------+-------------------+--------+ PERO Distal8                    monophasic                  +-----------+--------+-----+--------+-------------------+--------+ Right pedal artery 269 ms, category 4 severe ischemia.  Right Graft #1: FEM-POP BPG +------------------+--------+--------+----------+--------+                   PSV cm/sStenosisWaveform  Comments +------------------+--------+--------+----------+--------+ Inflow  81              monophasic         +------------------+--------+--------+----------+--------+ Prox Anastomosis          occluded                   +------------------+--------+--------+----------+--------+ Proximal Graft            occluded                   +------------------+--------+--------+----------+--------+ Mid Graft                 occluded                   +------------------+--------+--------+----------+--------+ Distal Graft               occluded                   +------------------+--------+--------+----------+--------+ Distal Anastomosis28              monophasic         +------------------+--------+--------+----------+--------+ Outflow           29              monophasic         +------------------+--------+--------+----------+--------+ OCCLUDED BPG  +----------+--------+-----+--------+----------------+--------+ LEFT      PSV cm/sRatioStenosisWaveform        Comments +----------+--------+-----+--------+----------------+--------+ CFA Prox  210                  biphasic                 +----------+--------+-----+--------+----------------+--------+ CFA Distal148                  biphasic                 +----------+--------+-----+--------+----------------+--------+ DFA       103                  barely triphasic         +----------+--------+-----+--------+----------------+--------+ SFA Prox  148                                           +----------+--------+-----+--------+----------------+--------+ TP Trunk  161                  biphasic                 +----------+--------+-----+--------+----------------+--------+  Left Graft #1: FEM-POP BPG +--------------------+--------+--------+--------+--------+                     PSV cm/sStenosisWaveformComments +--------------------+--------+--------+--------+--------+ Inflow              148             biphasic         +--------------------+--------+--------+--------+--------+ Proximal Anastomosis160             biphasic         +--------------------+--------+--------+--------+--------+ Proximal Graft      48              biphasic         +--------------------+--------+--------+--------+--------+ Mid Graft           69              biphasic         +--------------------+--------+--------+--------+--------+ Distal  Graft        51              biphasic          +--------------------+--------+--------+--------+--------+ Distal Anastomosis  139             biphasic         +--------------------+--------+--------+--------+--------+ Outflow             161             biphasicTPT      +--------------------+--------+--------+--------+--------+ Patent BPG, no stenosis seen.  Findings reported to Dr. Tyrell Antonio email through [facility] and [place], RN in [place] at 4:40 pm.  Summary: Right: Severe progression is noted compared to previous study. Occluded FEM-POP BPG. Left: No significant change as compared to previous study. Patent FEM-POP BPG.  See table(s) above for measurements and observations. Vascular consult recommended. Electronically signed by Ida Rogue MD on 08/26/2020 at 42:08:56 PM.    Final     ECG & Cardiac Imaging    TTE 2018: - Left ventricle: The cavity size was normal. Wall thickness was  normal. Systolic function was normal. The estimated ejection  fraction was in the range of 55% to 60%. Wall motion was normal;  there were no regional wall motion abnormalities. Doppler  parameters are consistent with both elevated ventricular  end-diastolic filling pressure and elevated left atrial filling  pressure.  - Mitral valve: Calcified annulus.  - Left atrium: The atrium was mildly dilated.  - Atrial septum: No defect or patent foramen ovale was identified.  - Pulmonary arteries: PA peak pressure: 46 mm Hg (S).   ECG 09/2019: sinus rhythm, LAE, septal infarct, nonspecific T wave abnormality  Assessment & Plan    PAD with critical limb ischemia of the RLE, occluded femoral-popliteal bypass graft: - Vascular surgery consulted, angiography in the morning. Keep NPO - Heparin bolus and infusion - Pain control with home meds, can given low-dose oxycodone prn - Continue statin and aspirin, takes chronically  Hyponatremia:  - May be a component of pseudohyponatremia due to hyperglycemia, insulin as above - She appears euvolemic on  exam. Dr. Fletcher Anon has requested IV fluids prior to angiogram. I am a little concerned about SIADH as she is on an SSRI and having pain/nausea. Will start with NS @ 75 mL/hr for 5 hours and recheck sodium/BMET in a few hours. - Hold lasix for now, will plan to resume post-op.  CKD stage IV:  - GFR has ranged from 23-26 since Nov 2021, currently at her baseline.  - Presumed diabetic nephropathy. Not on ACEi, ARB, or SGLT2 inhibitor for unclear reasons - Hold lasix for now, resume post-op - As above, will give gentle fluids per request of Dr. 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 Last LDL was at goal. - Continue aspirin, statin, and beta blocker  Other chronic conditions were managed with home medications Nutrition: NPO for procedure DVT ppx: heparin infusion GI ppx: continue home  famotidine Advanced Care Planning: Full Code   Signed, Marykay Lex, MD 08/27/2020, 2:07 AM

## 2020-08-27 NOTE — ED Provider Notes (Signed)
2:07 AM I spoke with Dr. Kalman Shan who will see and admit patient to his services.  Plan for angiogram in the morning.     Corena Herter, PA-C 08/27/20 7125    Ripley Fraise, MD 08/27/20 0630

## 2020-08-27 NOTE — Progress Notes (Signed)
ANTICOAGULATION CONSULT NOTE - Follow Up Consult  Pharmacy Consult for heparin Indication: R ischemic leg  No Known Allergies  Patient Measurements: Height: 5\' 4"  (162.6 cm) Weight: 58.8 kg (129 lb 9.6 oz) IBW/kg (Calculated) : 54.7 Heparin Dosing Weight: 58.8  Vital Signs: BP: 124/84 (02/17 1520) Pulse Rate: 72 (02/17 1525)  Labs: Recent Labs    08/26/20 1852 08/27/20 0407 08/27/20 0830  HGB 12.0  --   --   HCT 36.5  --   --   PLT 308  --   --   HEPARINUNFRC  --   --  0.63  CREATININE 2.30* 2.00*  --     Estimated Creatinine Clearance: 24.2 mL/min (A) (by C-G formula based on SCr of 2 mg/dL (H)).   Medical History: Past Medical History:  Diagnosis Date  . Adenomatous polyp of colon   . Anemia   . Arthritis   . Asthma   . Carotid artery disease ([place])   . Carpal tunnel syndrome   . Chronic renal insufficiency    Stage 3 Kidney disease  . Coronary artery disease   . Depression with anxiety   . Diabetes mellitus    type II  . Diverticulosis of colon (without mention of hemorrhage)   . GERD (gastroesophageal reflux disease)   . Heart murmur   . History of hiatal hernia   . Hyperlipidemia   . Hypertension   . Hypothyroidism   . PVD (peripheral vascular disease) ([facility])     Medications:  Scheduled:  . amLODipine  10 mg Oral Daily  . aspirin EC  81 mg Oral Daily  . atorvastatin  80 mg Oral Daily  . [START ON 08/28/2020] calcitRIOL  0.25 mcg Oral Q M,W,F  . carvedilol  12.5 mg Oral BID WC  . Chlorhexidine Gluconate Cloth  6 each Topical Daily  . famotidine  40 mg Oral Daily  . [START ON 08/28/2020] furosemide  40 mg Oral Daily  . insulin aspart  0-15 Units Subcutaneous Q4H  . insulin glargine  15 Units Subcutaneous QHS  . levothyroxine  112 mcg Oral QAC breakfast  . loratadine  10 mg Oral Daily  . PARoxetine  60 mg Oral QHS  . sodium chloride flush  3 mL Intravenous Q12H    Assessment: 65 yo admitted after abnormal ABI study. Plan for angiogram today  (2/17), continuing on heparin drip. No AC noted PTA.  Patient now s/p procedure. Pharmacy consulted to resume heparin with goal adjusted to 0.2-0.5 post-op.  Heparin level previously 0.63 on heparin 1000 units/hr. Heparin was reduced to 500 units/hr during procedure, but patient returned to ICU with heparin running at 800 units/hr with thrombolytics initiated. CBC stable on 2/16, SCr down to 2. No bleeding or issues with infusion per discussion with RN.   Goal of Therapy:  Heparin level 0.2-0.5 units/ml Monitor platelets by anticoagulation protocol: Yes   Plan:  Continue heparin IV at 800 units/hr Alteplase IK at 1mg /hr F/u heparin level 6hrs from latest rate adjustment Monitor heparin level/CBC/fibrinogen q6h x 24hrs, s/sx bleeding Plan to return to cath lab 2/18 per Vascular for thrombolytics check   Arturo Morton, PharmD, BCPS Please check AMION for all [facility] contact numbers Clinical Pharmacist 08/27/2020 4:38 PM

## 2020-08-27 NOTE — Progress Notes (Signed)
ANTICOAGULATION CONSULT NOTE - Follow Up Consult  Pharmacy Consult for heparin Indication: R ischemic leg  No Known Allergies  Patient Measurements: Height: 5\' 4"  (162.6 cm) Weight: 58.8 kg (129 lb 9.6 oz) IBW/kg (Calculated) : 54.7 Heparin Dosing Weight: 58.8  Vital Signs: BP: 149/64 (02/17 0715) Pulse Rate: 68 (02/17 0715)  Labs: Recent Labs    08/26/20 1852 08/27/20 0407 08/27/20 0830  HGB 12.0  --   --   HCT 36.5  --   --   PLT 308  --   --   HEPARINUNFRC  --   --  0.63  CREATININE 2.30* 2.00*  --     Estimated Creatinine Clearance: 24.2 mL/min (A) (by C-G formula based on SCr of 2 mg/dL (H)).   Medical History: Past Medical History:  Diagnosis Date  . Adenomatous polyp of colon   . Anemia   . Arthritis   . Asthma   . Carotid artery disease ([place])   . Carpal tunnel syndrome   . Chronic renal insufficiency    Stage 3 Kidney disease  . Coronary artery disease   . Depression   . Depression with anxiety   . Diabetes mellitus    type II  . Diverticulosis of colon (without mention of hemorrhage)   . GERD (gastroesophageal reflux disease)   . Heart murmur   . History of hiatal hernia   . Hyperlipidemia   . Hypertension   . Hypothyroidism   . Obesity   . PVD (peripheral vascular disease) ([place])   . Thyroid disease    hypo    Medications:  Scheduled:  . amLODipine  10 mg Oral Daily  . aspirin EC  81 mg Oral Daily  . atorvastatin  80 mg Oral Daily  . [START ON 08/28/2020] calcitRIOL  0.25 mcg Oral Q M,W,F  . carvedilol  12.5 mg Oral BID WC  . famotidine  40 mg Oral Daily  . [START ON 08/28/2020] furosemide  40 mg Oral Daily  . insulin aspart  0-15 Units Subcutaneous Q4H  . insulin glargine  15 Units Subcutaneous QHS  . levothyroxine  112 mcg Oral QAC breakfast  . loratadine  10 mg Oral Daily  . PARoxetine  60 mg Oral QHS    Assessment: 65 yo admitted after abnormal ABI study.  Plan for angiogram today (2/17).  No AC noted PTA.  CBC stable.   Pharmacy consulted to dose heparin.   Heparin level 0.63 is therapeutic on heparin 1000 units/hr. CBC stable on 2/16. No reported bleeding.   Goal of Therapy:  Heparin level 0.3-0.7 units/ml Monitor platelets by anticoagulation protocol: Yes   Plan:  Continue heparin 1000 units/hr  Follow up plan for heparin after angiogram today   Cristela Felt, PharmD Clinical Pharmacist  08/27/2020 9:33 AM

## 2020-08-27 NOTE — Op Note (Signed)
Patient name: [name] MRN: [id number] DOB: July 16, 1955 Sex: female  08/27/2020 Pre-operative Diagnosis: Acute on chronic limb ischemia of the right lower extremity with occluded right common femoral to below-knee popliteal artery prosthetic bypass Post-operative diagnosis:  Same Surgeon:  Marty Heck, MD Procedure Performed: 1.  Ultrasound-guided access of left common femoral artery 2.  CO2 aortogram with catheter selection of aorta 3.  Right lower extremity arteriogram with selection of third order branches 4.  Placement of thrombolytic's catheter in the right common femoral to below-knee popliteal artery prosthetic bypass with initiation of thrombolysis (Alteplase) 5.  32 minutes of monitored moderate conscious sedation time  Indications: Patient is a 65 year old female who was seen yesterday by Dr. Fletcher Anon with new symptoms in the right lower extremity and a duplex in the office that indicated occluded right common femoral to below-knee popliteal prosthetic bypass previously performed by Dr. Oneida Alar.  Patient was admitted with plans for attempted thrombolysis and limb salvage today.  She presents today after risk benefits discussed  Findings:   CO2 aortogram showed a small infrarenal aorta with small iliacs but no overt flow-limiting stenosis.  Right common femoral and profunda were patent.  The right common femoral to below-knee popliteal artery prosthetic bypass was occluded with no flow.  Ultimately from left femoral access the right bypass was cannulated and was able to get a wire across the distal anastomosis of the bypass into the below-knee popliteal artery.  Patient did appear to have patent three-vessel runoff distally that were very small.  50 cm UniFuse catheter was placed in the bypass for initiation of thrombolysis overnight.   Procedure:  The patient was identified in the holding area and taken to room 8.  The patient was then placed supine on the table and prepped  and draped in the usual sterile fashion.  A time out was called.  Ultrasound was used to evaluate the left common femoral artery.  It was patent .  A digital ultrasound image was acquired.  A micropuncture needle was used to access the left common femoral artery under ultrasound guidance.  An 018 wire was advanced without resistance and a micropuncture sheath was placed.  The 018 wire was removed and a benson wire was placed.  The micropuncture sheath was exchanged for a 5 french sheath.  An omniflush catheter was advanced over the wire to the level of L-1.  An abdominal angiogram was obtained with CO2.  Next, using the omniflush catheter it was pulled down in the aorta and we got bilateral iliac runoff and evaluation of the right leg bypass that was occluded.  I then was able to use a Glidewire advantage over the aortic bifurcation and into the right profunda to get a 6 [place] Ansell sheath over the aortic bifurcation into the right external iliac artery.  Patient was given additional 3000 units for IV heparin.  Confirmed ACT was 249.  I then used a BER 2 catheter with a Glidewire advantage to cannulate the right leg bypass and was able to cross the bypass and cross the distal anastomosis into the native below-knee popliteal artery.  I confirmed on hand-injection that I was in the true lumen of the vessel.  We selected a long 50 cm UniFuse catheter placed down the right leg bypass across the distal anastomosis into the native below-knee popliteal artery.  Will initiate alteplase at 1 mg an hour through unifuse catheter and heparin at 800 units an hour through the sheath.  Plan: Patient will go to the ICU for thrombolysis of right leg bypass.  Plan thrombolytics check tomorrow in the Cath Lab.  Marty Heck, MD [facility] Office: [place]

## 2020-08-28 ENCOUNTER — Inpatient Hospital Stay (HOSPITAL_COMMUNITY)
Admission: EM | Disposition: A | Discharge status: Home / Self Care | Payer: Self-pay | Source: Home / Self Care | Attending: Cardiovascular Disease

## 2020-08-28 ENCOUNTER — Encounter (HOSPITAL_COMMUNITY): Payer: Self-pay | Admitting: Vascular Surgery

## 2020-08-28 DIAGNOSIS — I998 Other disorder of circulatory system: Secondary | ICD-10-CM

## 2020-08-28 HISTORY — PX: PERIPHERAL VASCULAR INTERVENTION: CATH118257

## 2020-08-28 HISTORY — PX: PERIPHERAL VASCULAR THROMBECTOMY: CATH118306

## 2020-08-28 LAB — BASIC METABOLIC PANEL
Anion gap: 9 (ref 5–15)
BUN: 32 mg/dL — ABNORMAL HIGH (ref 8–23)
CO2: 22 mmol/L (ref 22–32)
Calcium: 8.2 mg/dL — ABNORMAL LOW (ref 8.9–10.3)
Chloride: 107 mmol/L (ref 98–111)
Creatinine, Ser: 1.92 mg/dL — ABNORMAL HIGH (ref 0.44–1.00)
GFR, Estimated: 29 mL/min — ABNORMAL LOW (ref >60)
Glucose, Bld: 112 mg/dL — ABNORMAL HIGH (ref 70–99)
Potassium: 3.4 mmol/L — ABNORMAL LOW (ref 3.5–5.1)
Sodium: 138 mmol/L (ref 135–145)

## 2020-08-28 LAB — CBC
HCT: 31.6 % — ABNORMAL LOW (ref 36.0–46.0)
Hemoglobin: 11 g/dL — ABNORMAL LOW (ref 12.0–15.0)
MCH: 30.6 pg (ref 26.0–34.0)
MCHC: 34.8 g/dL (ref 30.0–36.0)
MCV: 87.8 fL (ref 80.0–100.0)
Platelets: 245 10*3/uL (ref 150–400)
RBC: 3.6 MIL/uL — ABNORMAL LOW (ref 3.87–5.11)
RDW: 13.1 % (ref 11.5–15.5)
WBC: 11.5 10*3/uL — ABNORMAL HIGH (ref 4.0–10.5)
nRBC: 0 % (ref 0.0–0.2)

## 2020-08-28 LAB — POCT ACTIVATED CLOTTING TIME: Activated Clotting Time: 315 seconds

## 2020-08-28 LAB — GLUCOSE, CAPILLARY
Glucose-Capillary: 116 mg/dL — ABNORMAL HIGH (ref 70–99)
Glucose-Capillary: 117 mg/dL — ABNORMAL HIGH (ref 70–99)
Glucose-Capillary: 129 mg/dL — ABNORMAL HIGH (ref 70–99)
Glucose-Capillary: 143 mg/dL — ABNORMAL HIGH (ref 70–99)
Glucose-Capillary: 39 mg/dL — CL (ref 70–99)
Glucose-Capillary: 65 mg/dL — ABNORMAL LOW (ref 70–99)
Glucose-Capillary: 65 mg/dL — ABNORMAL LOW (ref 70–99)
Glucose-Capillary: 87 mg/dL (ref 70–99)
Glucose-Capillary: 95 mg/dL (ref 70–99)

## 2020-08-28 LAB — FIBRINOGEN: Fibrinogen: 645 mg/dL — ABNORMAL HIGH (ref 210–475)

## 2020-08-28 LAB — HEPARIN LEVEL (UNFRACTIONATED): Heparin Unfractionated: 0.36 IU/mL (ref 0.30–0.70)

## 2020-08-28 SURGERY — PERIPHERAL VASCULAR THROMBECTOMY
Anesthesia: LOCAL | Laterality: Right

## 2020-08-28 MED ORDER — HYDRALAZINE HCL 20 MG/ML IJ SOLN: 5.0000 mg | INTRAMUSCULAR | Status: DC | PRN

## 2020-08-28 MED ORDER — HEPARIN SODIUM (PORCINE) 5000 UNIT/ML IJ SOLN
5000.0000 [IU] | Freq: Three times a day (TID) | INTRAMUSCULAR | Status: DC
  Administered 2020-08-28 – 2020-08-29 (×2): 5000 [IU] via SUBCUTANEOUS
  Filled 2020-08-28 (×2): qty 1

## 2020-08-28 MED ORDER — LIDOCAINE HCL (PF) 1 % IJ SOLN
INTRAMUSCULAR | Status: AC
  Filled 2020-08-28: qty 30

## 2020-08-28 MED ORDER — DEXTROSE 50 % IV SOLN: 12.5000 g | INTRAVENOUS | Status: AC

## 2020-08-28 MED ORDER — LABETALOL HCL 5 MG/ML IV SOLN: 10.0000 mg | INTRAVENOUS | Status: DC | PRN

## 2020-08-28 MED ORDER — LIDOCAINE HCL (PF) 1 % IJ SOLN
INTRAMUSCULAR | Status: DC | PRN
  Administered 2020-08-28: 15 mL

## 2020-08-28 MED ORDER — CLOPIDOGREL BISULFATE 300 MG PO TABS
ORAL_TABLET | ORAL | Status: DC | PRN
  Administered 2020-08-28: 300 mg via ORAL

## 2020-08-28 MED ORDER — FENTANYL CITRATE (PF) 100 MCG/2ML IJ SOLN
INTRAMUSCULAR | Status: DC | PRN
  Administered 2020-08-28 (×2): 25 ug via INTRAVENOUS

## 2020-08-28 MED ORDER — HEPARIN (PORCINE) IN NACL 1000-0.9 UT/500ML-% IV SOLN
INTRAVENOUS | Status: AC
  Filled 2020-08-28: qty 1000

## 2020-08-28 MED ORDER — HEPARIN (PORCINE) IN NACL 1000-0.9 UT/500ML-% IV SOLN
INTRAVENOUS | Status: DC | PRN
  Administered 2020-08-28: 500 mL

## 2020-08-28 MED ORDER — IODIXANOL 320 MG/ML IV SOLN
INTRAVENOUS | Status: DC | PRN
  Administered 2020-08-28: 35 mL via INTRA_ARTERIAL

## 2020-08-28 MED ORDER — POTASSIUM CHLORIDE CRYS ER 20 MEQ PO TBCR
40.0000 meq | EXTENDED_RELEASE_TABLET | Freq: Once | ORAL | Status: AC
  Administered 2020-08-28: 40 meq via ORAL
  Filled 2020-08-28: qty 2

## 2020-08-28 MED ORDER — CLOPIDOGREL BISULFATE 300 MG PO TABS
300.0000 mg | ORAL_TABLET | Freq: Once | ORAL | Status: AC
  Administered 2020-08-28: 300 mg via ORAL
  Filled 2020-08-28: qty 1

## 2020-08-28 MED ORDER — FENTANYL CITRATE (PF) 100 MCG/2ML IJ SOLN
INTRAMUSCULAR | Status: AC
  Filled 2020-08-28: qty 2

## 2020-08-28 MED ORDER — MIDAZOLAM HCL 2 MG/2ML IJ SOLN
INTRAMUSCULAR | Status: DC | PRN
  Administered 2020-08-28: 1 mg via INTRAVENOUS

## 2020-08-28 MED ORDER — HEPARIN SODIUM (PORCINE) 1000 UNIT/ML IJ SOLN
INTRAMUSCULAR | Status: AC
  Filled 2020-08-28: qty 1

## 2020-08-28 MED ORDER — FAMOTIDINE 20 MG PO TABS
20.0000 mg | ORAL_TABLET | Freq: Every day | ORAL | Status: DC
Start: 2020-08-29 — End: 2020-08-29
  Administered 2020-08-29: 20 mg via ORAL
  Filled 2020-08-28: qty 1

## 2020-08-28 MED ORDER — SODIUM CHLORIDE 0.9% FLUSH: 3.0000 mL | INTRAVENOUS | Status: DC | PRN

## 2020-08-28 MED ORDER — CLOPIDOGREL BISULFATE 75 MG PO TABS
75.0000 mg | ORAL_TABLET | Freq: Every day | ORAL | Status: DC
  Administered 2020-08-29: 75 mg via ORAL
  Filled 2020-08-28: qty 1

## 2020-08-28 MED ORDER — SODIUM CHLORIDE 0.9 % IV SOLN: 250.0000 mL | INTRAVENOUS | Status: DC | PRN

## 2020-08-28 MED ORDER — MIDAZOLAM HCL 2 MG/2ML IJ SOLN
INTRAMUSCULAR | Status: AC
  Filled 2020-08-28: qty 2

## 2020-08-28 MED ORDER — HEPARIN SODIUM (PORCINE) 1000 UNIT/ML IJ SOLN
INTRAMUSCULAR | Status: DC | PRN
  Administered 2020-08-28: 6000 [IU] via INTRAVENOUS

## 2020-08-28 MED ORDER — CLOPIDOGREL BISULFATE 300 MG PO TABS
ORAL_TABLET | ORAL | Status: AC
  Filled 2020-08-28: qty 1

## 2020-08-28 MED ORDER — SODIUM CHLORIDE 0.9% FLUSH
3.0000 mL | Freq: Two times a day (BID) | INTRAVENOUS | Status: DC
  Administered 2020-08-28 – 2020-08-29 (×3): 3 mL via INTRAVENOUS

## 2020-08-28 MED ORDER — SODIUM CHLORIDE 0.9 % IV SOLN: INTRAVENOUS | Status: AC

## 2020-08-28 MED ORDER — DEXTROSE 50 % IV SOLN
INTRAVENOUS | Status: AC
  Administered 2020-08-28: 12.5 g via INTRAVENOUS
  Filled 2020-08-28: qty 50

## 2020-08-28 SURGICAL SUPPLY — 10 items
BAG SNAP BAND KOVER 36X36 (MISCELLANEOUS) ×2 IMPLANT
BALLN IN.PACT DCB 6X40 (BALLOONS) ×2
COVER DOME SNAP 22 D (MISCELLANEOUS) ×2 IMPLANT
DCB IN.PACT 6X40 (BALLOONS) ×1 IMPLANT
KIT ENCORE 26 ADVANTAGE (KITS) ×2 IMPLANT
PROTECTION STATION PRESSURIZED (MISCELLANEOUS) ×2
SHEATH PINNACLE 6F 10CM (SHEATH) ×2 IMPLANT
STATION PROTECTION PRESSURIZED (MISCELLANEOUS) ×1 IMPLANT
TRAY PV CATH (CUSTOM PROCEDURE TRAY) ×2 IMPLANT
WIRE HI TORQ VERSACORE J 260CM (WIRE) ×2 IMPLANT

## 2020-08-28 NOTE — Progress Notes (Signed)
Inpatient Diabetes Program Recommendations  AACE/ADA: New Consensus Statement on Inpatient Glycemic Control (2015)  Target Ranges:  Prepandial:   less than 140 mg/dL      Peak postprandial:   less than 180 mg/dL (1-2 hours)      Critically ill patients:  140 - 180 mg/dL   Lab Results  Component Value Date   GLUCAP 95 08/28/2020   HGBA1C 12.2 (H) 08/27/2020    Review of Glycemic Control Results for [name], [name] (MRN [id number]) as of 08/28/2020 12:29  Ref. Range 08/28/2020 03:46 08/28/2020 04:17 08/28/2020 10:13 08/28/2020 11:23  Glucose-Capillary Latest Ref Range: 70 - 99 mg/dL 65 (L) 143 (H) 39 (LL) 95   Diabetes history: Type 2 Dm Outpatient Diabetes medications: Basaglar 30 units QAM, 60 units QPM Current orders for Inpatient glycemic control: Lantus 15 units QHS, Novolog 0-15 units Q4H  Inpatient Diabetes Program Recommendations:    Noted multiple episodes of hypoglycemia. Would consider discontinuing Lantus until glucose trends increase above inpatient goals of 180 mg/dL.    Thanks, Bronson Curb, MSN, RNC-OB Diabetes Coordinator [phone] (8a-5p)

## 2020-08-28 NOTE — Progress Notes (Signed)
Left groin A 6 F arterial sheath was removed level 0 pre and post sheath removal. Manual pressure held for 20 minutes 2+ DP. Clean dry dressing applied with gauze and tegaderm. Bed rest started at 1700. Vital signs remain stable. Care instruction given to patient.

## 2020-08-28 NOTE — Progress Notes (Signed)
Pt transferred to [facility] by [facility] staff, safety maintained

## 2020-08-28 NOTE — Op Note (Signed)
Procedure: Thrombolytics recheck angioplasty distal anastomosis right femoral to below-knee popliteal bypass  Preoperative diagnosis: Ischemia right leg  Postoperative diagnosis: Same  Anesthesia: Local with IV sedation  Operative findings: 1.  Distal anastomotic bandlike narrowing angioplastied with a 6 x 40 Admiral Impact drug-eluting balloon  2.  Three-vessel runoff diminutive peroneal artery  Total contrast: 35 cc  Sedation time: 58 minutes  Operative details: After obtaining informed consent, the patient was taken to the [facility] lab.  The patient was placed in supine position angio table.  Pre-existing sheath in the left groin was prepped and draped in usual sterile fashion.  Local anesthesia was infiltrated around the sheath exit site.  The obturator and the lytics catheter was then removed and an 035 versa core wire advanced to the lytics catheter all the way down into the below-knee popliteal space under fluoroscopy.  The lytics catheter was then removed.  Contrast angiogram was then obtained from the right groin all the way down to the distal outflow.  The profunda femoris is patent.  The right common femoral artery is patent.  The bypass graft originates from the common femoral artery and is uniformly patent without any retained thrombus all the way to the level of the distal anastomosis and the below-knee popliteal artery.  The peroneal artery is fairly diminutive but the anterior tibial and posterior tibial artery are all patent.  Magnified views of the distal anastomosis were performed in a Mag-Tab fashion and there was a bandlike stenosis adjacent to the distal anastomosis.  Patient was given 6000 units of intravenous heparin.  ACT was confirmed to be greater than 250.  The lesion was then angioplastied with a 6 x 40 abnormal Impact drug-eluting balloon to nominal pressure at 8 atm for 3 minutes.  Completion angiogram still showed some haziness in this area but the bandlike narrowing had  disappeared and there was more brisk flow through the distal bypass graft.  Lateral foot view was obtained which showed there was good runoff all the way to the foot.  At this point the sheath was pulled back out of the left groin and swapped out for a short sheath.  This was done over the versa core wire.  This was thoroughly flushed with heparinized saline.  Patient was taken back to her room with the left femoral sheath in place to be pulled there.  The patient tolerated procedure well and there were no complications.  She will be placed on Plavix and aspirin.  A loading dose of Plavix was given on the table.  Operative management: Successful thrombolysis and angioplasty of distal anastomosis right femoropopliteal bypass.  Patient will be scheduled for a follow-up office visit with duplex scan in 1 month.  She will be maintained on Plavix and aspirin.  Ruta Hinds, MD [facility] Office: [phone]

## 2020-08-29 DIAGNOSIS — I739 Peripheral vascular disease, unspecified: Secondary | ICD-10-CM

## 2020-08-29 DIAGNOSIS — I25708 Atherosclerosis of coronary artery bypass graft(s), unspecified, with other forms of angina pectoris: Secondary | ICD-10-CM

## 2020-08-29 DIAGNOSIS — N184 Chronic kidney disease, stage 4 (severe): Secondary | ICD-10-CM

## 2020-08-29 LAB — CBC
HCT: 30.5 % — ABNORMAL LOW (ref 36.0–46.0)
Hemoglobin: 9.9 g/dL — ABNORMAL LOW (ref 12.0–15.0)
MCH: 29.2 pg (ref 26.0–34.0)
MCHC: 32.5 g/dL (ref 30.0–36.0)
MCV: 90 fL (ref 80.0–100.0)
Platelets: 213 10*3/uL (ref 150–400)
RBC: 3.39 MIL/uL — ABNORMAL LOW (ref 3.87–5.11)
RDW: 13.2 % (ref 11.5–15.5)
WBC: 11.2 10*3/uL — ABNORMAL HIGH (ref 4.0–10.5)
nRBC: 0 % (ref 0.0–0.2)

## 2020-08-29 LAB — BASIC METABOLIC PANEL
Anion gap: 8 (ref 5–15)
BUN: 24 mg/dL — ABNORMAL HIGH (ref 8–23)
CO2: 22 mmol/L (ref 22–32)
Calcium: 7.8 mg/dL — ABNORMAL LOW (ref 8.9–10.3)
Chloride: 109 mmol/L (ref 98–111)
Creatinine, Ser: 1.72 mg/dL — ABNORMAL HIGH (ref 0.44–1.00)
GFR, Estimated: 33 mL/min — ABNORMAL LOW (ref >60)
Glucose, Bld: 124 mg/dL — ABNORMAL HIGH (ref 70–99)
Potassium: 3.4 mmol/L — ABNORMAL LOW (ref 3.5–5.1)
Sodium: 139 mmol/L (ref 135–145)

## 2020-08-29 LAB — GLUCOSE, CAPILLARY
Glucose-Capillary: 73 mg/dL (ref 70–99)
Glucose-Capillary: 50 mg/dL — ABNORMAL LOW (ref 70–99)
Glucose-Capillary: 136 mg/dL — ABNORMAL HIGH (ref 70–99)
Glucose-Capillary: 178 mg/dL — ABNORMAL HIGH (ref 70–99)

## 2020-08-29 LAB — POCT ACTIVATED CLOTTING TIME: Activated Clotting Time: 136 seconds

## 2020-08-29 MED ORDER — FUROSEMIDE 40 MG PO TABS: 40.0000 mg | ORAL_TABLET | Freq: Every day | ORAL | Status: DC

## 2020-08-29 MED ORDER — POTASSIUM CHLORIDE CRYS ER 20 MEQ PO TBCR
40.0000 meq | EXTENDED_RELEASE_TABLET | Freq: Once | ORAL | Status: AC
  Administered 2020-08-29: 40 meq via ORAL
  Filled 2020-08-29: qty 2

## 2020-08-29 MED ORDER — CLOPIDOGREL BISULFATE 75 MG PO TABS: 75.0000 mg | ORAL_TABLET | Freq: Every day | ORAL | 2 refills | Status: DC

## 2020-08-29 NOTE — Discharge Summary (Signed)
Discharge Summary  Patient ID: [name] [id number] 65 y.o. 09/14/1955  Admit date: 08/26/2020  Discharge date and time: No discharge date for patient encounter.   Admitting Physician: Wellington Hampshire, MD   Discharge Physician: Dr. Jamelle Haring, MD  Admission Diagnoses: Ischemic leg [I99.8] PAD (peripheral artery disease) ([place]) [I73.9] Critical lower limb ischemia ([place]) [I70.229]  Discharge Diagnoses: Ischemic leg, PAD, critical lower limb ischemia  Admission Condition: poor  Discharged Condition: good  Indication for Admission: Patient is a 65 year old female who was seen by Cardiologist, Dr. Fletcher Anon with new symptoms in the right lower extremity and a duplex in the office that indicated occluded right common femoral to below-knee popliteal prosthetic bypass previously performed by Dr. Oneida Alar.  Patient was admitted with plans for attempted thrombolysis and limb salvage today  Hospital Course:  65 year old female presented to the ER on 08/26/20 with an occluded right lower extremity bypass graft and rest pain. She ws admitted and started on IV heparin. She also was given IVFs due to renal function for pre hydration. On 08/27/20 she was taken to the OR by Dr. Carlis Abbott and underwent CO2 Aortogram, right lower extremity arteriogram, and placement of thrombolytic catheter in the right common femoral to below knee popliteal artery prosthetic bypass with initiation of thrombolysis (Alteplase). She tolerated the procedure well. She was transferred to the ICU with the thrombolysis catheter. She did well over night and was taken back to the operating room on 08/28/20 by Dr. Oneida Alar. She underwent thrombolytics recheck, with successful thrombolysis and angioplasty of distal anastomosis of the right femoral to below knee popliteal bypass. She was given loading dose of Plavix on the table. She tolerated the procedure well and was taken to PACU in stable condition.  She remained stable and was taken  back to ICU. Her femoral access site remained clean and dry without hematoma.   POD#1 she did well over night. Right leg rest pain completely resolved. Cath access site without bruising or hematoma. Right lower extremity well perfused with palpable DP pulse. She remained stable otherwise for discharge home. Follow up with Cardiologist arranged. She will go home on Aspirin and Statin and Plavix. She has follow up arranged in 1 month with Dr. Oneida Alar.   Consults: cardiology  Treatments: IV hydration, anticoagulation: heparin and surgery: CO2 Aortogram, right lower extremity arteriogram, and placement of thrombolytic catheter in the right common femoral to below knee popliteal artery prosthetic bypass with initiation of thrombolysis (Alteplase). thrombolytics recheck, with successful thrombolysis and angioplasty of distal anastomosis of the right femoral to below knee popliteal bypass.   Disposition: Discharge disposition: 01-Home or Self Care       Patient Instructions:  Allergies as of 08/29/2020   No Known Allergies     Medication List    TAKE these medications   acetaminophen-codeine 300-30 MG tablet Commonly known as: TYLENOL #3 Take 1-2 tablets by mouth every 6 (six) hours as needed for moderate pain.   albuterol 108 (90 Base) MCG/ACT inhaler Commonly known as: VENTOLIN HFA Inhale 2 puffs into the lungs every 6 (six) hours as needed for wheezing or shortness of breath.   ALPRAZolam 1 MG tablet Commonly known as: XANAX Take 1 mg by mouth 3 (three) times daily as needed for anxiety.   amLODipine 10 MG tablet Commonly known as: NORVASC Take 10 mg by mouth daily.   aspirin EC 81 MG tablet Take 81 mg by mouth at bedtime.   atorvastatin 80 MG tablet Commonly known  as: LIPITOR Take 80 mg by mouth daily.   Basaglar KwikPen 100 UNIT/ML Inject 20-60 Units into the skin 2 (two) times daily before a meal. Per sliding scale   calcitRIOL 0.25 MCG capsule Commonly known as:  ROCALTROL Take 0.25 mcg by mouth every Monday, Wednesday, and Friday.   carvedilol 12.5 MG tablet Commonly known as: COREG TAKE 1 TABLET BY MOUTH TWICE A DAY What changed: when to take this   cetirizine 10 MG tablet Commonly known as: ZYRTEC Take 10 mg by mouth daily.   clopidogrel 75 MG tablet Commonly known as: PLAVIX Take 1 tablet (75 mg total) by mouth daily with breakfast.   famotidine 40 MG tablet Commonly known as: PEPCID TAKE 1 TABLET BY MOUTH EVERY DAY   fenofibrate micronized 200 MG capsule Commonly known as: LOFIBRA Take 200 mg by mouth daily.   ferrous sulfate 325 (65 FE) MG tablet Take 325 mg by mouth 3 (three) times daily.   Fish Oil 1000 MG Caps Take 1,000 mg by mouth 2 (two) times daily.   furosemide 40 MG tablet Commonly known as: LASIX Take 40 mg by mouth daily.   Levothyroxine Sodium 112 MCG Caps Take 112 mcg by mouth daily before breakfast.   PARoxetine 40 MG tablet Commonly known as: PAXIL Take 60 mg by mouth at bedtime.   promethazine 25 MG tablet Commonly known as: PHENERGAN Take 25 mg by mouth daily as needed for nausea or vomiting.   tiZANidine 4 MG tablet Commonly known as: Zanaflex Take 1 tablet (4 mg total) by mouth every 6 (six) hours as needed for muscle spasms.   Vitamin D (Ergocalciferol) 1.25 MG (50000 UNIT) Caps capsule Commonly known as: DRISDOL Take 50,000 Units by mouth 2 (two) times a week. Takes on Mondays and Fridays      Activity: activity as tolerated Diet: cardiac diet Wound Care: keep wound clean and dry  Follow-up with Dr. Oneida Alar in 1 month.  SignedKaroline Caldwell, PA-C 08/29/2020 10:07 AM [facility] Office: [phone]

## 2020-08-29 NOTE — Progress Notes (Signed)
   ASSESSMENT & PLAN:  [name] is a 65 y.o. female status post intra-arterial thrombolysis 2/17-2/18/22 for thrombosed RLE prosthetic fem-pop bypass graft. Now resolved.  Creatinine stable post angio. Ready for discharge. Continue ASA / Statin. Continue Plavix indefinitely to promote graft patency. Follow up with Dr. Oneida Alar in 1 month with ABI / Duplex.  SUBJECTIVE:  Looks good s/p thrombolysis. No complaints. Wants to go home.  OBJECTIVE:  BP (!) 143/51 (BP Location: Left Arm)   Pulse 60   Temp 97.6 F (36.4 C)   Resp 15   Ht 5\' 4"  (1.626 m)   Wt 60.1 kg   SpO2 99%   BMI 22.74 kg/m   Intake/Output Summary (Last 24 hours) at 08/29/2020 1024 Last data filed at 08/29/2020 0600 Gross per 24 hour  Intake --  Output 1600 ml  Net -1600 ml    NAD RRR Unlabored 2+ R DP L groin access site soft  CBC Latest Ref Rng & Units 08/29/2020 08/28/2020 08/27/2020  WBC 4.0 - 10.5 K/uL 11.2(H) 11.5(H) 11.0(H)  Hemoglobin 12.0 - 15.0 g/dL 9.9(L) 11.0(L) 11.5(L)  Hematocrit 36.0 - 46.0 % 30.5(L) 31.6(L) 34.5(L)  Platelets 150 - 400 K/uL 213 245 269     CMP Latest Ref Rng & Units 08/29/2020 08/28/2020 08/27/2020  Glucose 70 - 99 mg/dL 124(H) 112(H) 276(H)  BUN 8 - 23 mg/dL 24(H) 32(H) 37(H)  Creatinine 0.44 - 1.00 mg/dL 1.72(H) 1.92(H) 2.00(H)  Sodium 135 - 145 mmol/L 139 138 134(L)  Potassium 3.5 - 5.1 mmol/L 3.4(L) 3.4(L) 4.0  Chloride 98 - 111 mmol/L 109 107 99  CO2 22 - 32 mmol/L 22 22 24   Calcium 8.9 - 10.3 mg/dL 7.8(L) 8.2(L) 8.9  Total Protein 6.5 - 8.1 g/dL - - -  Total Bilirubin 0.3 - 1.2 mg/dL - - -  Alkaline Phos 38 - 126 U/L - - -  AST 15 - 41 U/L - - -  ALT 0 - 44 U/L - - -    Estimated Creatinine Clearance: 28.2 mL/min (A) (by C-G formula based on SCr of 1.72 mg/dL (H)).   [name]. Stanford Breed, MD [facility] Phone Number: [phone] 08/29/2020 10:24 AM

## 2020-08-29 NOTE — Discharge Instructions (Signed)
  [facility]  Discharge Instructions  Lower Extremity Angiogram; Angioplasty/Stenting  Please refer to the following instructions for your post-procedure care. Your surgeon or physician assistant will discuss any changes with you.  Activity  Avoid lifting more than 8 pounds (1 gallons of milk) for 5 days after your procedure. You may walk as much as you can tolerate. It's OK to drive after 72 hours.  Bathing/Showering  You may shower the day after your procedure. If you have a bandage, you may remove it at 24- 48 hours. Clean your incision site with mild soap and water. Pat the area dry with a clean towel.  Diet  Resume your pre-procedure diet. There are no special food restrictions following this procedure. All patients with peripheral vascular disease should follow a low fat/low cholesterol diet. In order to heal from your surgery, it is CRITICAL to get adequate nutrition. Your body requires vitamins, minerals, and protein. Vegetables are the best source of vitamins and minerals. Vegetables also provide the perfect balance of protein. Processed food has little nutritional value, so try to avoid this.  Medications  Resume taking all of your medications unless your doctor tells you not to. If your incision is causing pain, you may take over-the-counter pain relievers such as acetaminophen (Tylenol)  Follow Up  Follow up will be arranged at the time of your procedure. You may have an office visit scheduled or may be scheduled for surgery. Ask your surgeon if you have any questions.  Please call us immediately for any of the following conditions: .Severe or worsening pain your legs or feet at rest or with walking. .Increased pain, redness, drainage at your groin puncture site. .Fever of 101 degrees or higher. .If you have any mild or slow bleeding from your puncture site: lie down, apply firm constant pressure over the area with a piece of gauze or a  clean wash cloth for 30 minutes- no peeking!, call 911 right away if you are still bleeding after 30 minutes, or if the bleeding is heavy and unmanageable.  Reduce your risk factors of vascular disease:  . Stop smoking. If you would like help call [facility] at 1-800-QUIT-NOW (1-800-784-8669) or [place] at 336-586-4000. . Manage your cholesterol . Maintain a desired weight . Control your diabetes . Keep your blood pressure down .  If you have any questions, please call the office at [phone] 

## 2020-08-29 NOTE — Progress Notes (Signed)
Progress Note  Patient Name: [name] Date of Encounter: 08/29/2020  [facility] Cardiologist: [name], MD   Subjective   The pain in her leg has resolved.   Inpatient Medications    Scheduled Meds: . amLODipine  10 mg Oral Daily  . aspirin EC  81 mg Oral Daily  . atorvastatin  80 mg Oral Daily  . calcitRIOL  0.25 mcg Oral Q M,W,F  . carvedilol  12.5 mg Oral BID WC  . chlorhexidine  15 mL Mouth Rinse BID  . Chlorhexidine Gluconate Cloth  6 each Topical Daily  . clopidogrel  75 mg Oral Q breakfast  . famotidine  20 mg Oral Daily  . furosemide  40 mg Oral Daily  . heparin  5,000 Units Subcutaneous Q8H  . insulin aspart  0-15 Units Subcutaneous Q4H  . insulin glargine  15 Units Subcutaneous QHS  . levothyroxine  112 mcg Oral QAC breakfast  . loratadine  10 mg Oral Daily  . mouth rinse  15 mL Mouth Rinse q12n4p  . PARoxetine  60 mg Oral QHS  . sodium chloride flush  3 mL Intravenous Q12H  . sodium chloride flush  3 mL Intravenous Q12H   Continuous Infusions: . sodium chloride    . sodium chloride     PRN Meds: sodium chloride, sodium chloride, acetaminophen, acetaminophen-codeine, albuterol, ALPRAZolam, hydrALAZINE, labetalol, metoprolol tartrate, midazolam, morphine injection, ondansetron **OR** ondansetron (ZOFRAN) IV, ondansetron (ZOFRAN) IV, sodium chloride flush, sodium chloride flush, tiZANidine   Vital Signs    Vitals:   08/29/20 0600 08/29/20 0700 08/29/20 0753 08/29/20 0800  BP: (!) 120/56 (!) 88/58  (!) 143/51  Pulse: (!) 58 (!) 58  60  Resp: _0 Temp:   97.6 F (36.4 C)   TempSrc:      SpO2: 98% 99%  99%  Weight:  60.1 kg    Height:        Intake/Output Summary (Last 24 hours) at 08/29/2020 0958 Last data filed at 08/29/2020 0600 Gross per 24 hour  Intake 50.94 ml  Output 1600 ml  Net -1549.06 ml   Last 3 Weights 08/29/2020 08/26/2020 03/10/2020  Weight (lbs) 132 lb 7.9 oz 129 lb 9.6 oz 143 lb  Weight (kg) 60.1 kg 58.786 kg  64.864 kg  Some encounter information is confidential and restricted. Go to Review Flowsheets activity to see all data.      Telemetry    SR - Personally Reviewed  Physical Exam   GEN: No acute distress.   Neck: No JVD Cardiac: RRR, no murmurs, rubs, or gallops.  Respiratory: Clear to auscultation bilaterally. GI: Soft, nontender, non-distended  MS: No edema; RLE with discoloration mottled look of toes, right foot is erythematous  Neuro:  Nonfocal  Psych: Normal affect   Labs    High Sensitivity Troponin:  No results for input(s): TROPONINIHS in the last 720 hours.    Chemistry Recent Labs  Lab 08/26/20 1852 08/27/20 0407 08/28/20 0456 08/29/20 0059  NA 127* 134* 138 139  K 4.7 4.0 3.4* 3.4*  CL 90* 99 107 109  CO2 _1 GLUCOSE 409* 276* 112* 124*  BUN 39* 37* 32* 24*  CREATININE 2.30* 2.00* 1.92* 1.72*  CALCIUM 9.3 8.9 8.2* 7.8*  PROT 6.7  --   --   --   ALBUMIN 2.7*  --   --   --   AST 15  --   --   --  ALT 11  --   --   --   ALKPHOS 71  --   --   --   BILITOT 0.8  --   --   --   GFRNONAA 23* 27* 29* 33*  ANIONGAP _0 Hematology Recent Labs  Lab 08/27/20 2233 08/28/20 0136 08/29/20 0059  WBC 11.0* 11.5* 11.2*  RBC 3.93 3.60* 3.39*  HGB 11.5* 11.0* 9.9*  HCT 34.5* 31.6* 30.5*  MCV 87.8 87.8 90.0  MCH 29.3 30.6 29.2  MCHC 33.3 34.8 32.5  RDW 13.0 13.1 13.2  PLT 269 245 213    BNPNo results for input(s): BNP, PROBNP in the last 168 hours.   DDimer No results for input(s): DDIMER in the last 168 hours.   Radiology    PERIPHERAL VASCULAR CATHETERIZATION  Result Date: 08/28/2020 Procedure: Thrombolytics recheck angioplasty distal anastomosis right femoral to below-knee popliteal bypass Preoperative diagnosis: Ischemia right leg Postoperative diagnosis: Same Anesthesia: Local with IV sedation Operative findings: 1.  Distal anastomotic bandlike narrowing angioplastied with a 6 x 40 Admiral Impact drug-eluting balloon 2.   Three-vessel runoff diminutive peroneal artery Total contrast: 35 cc Sedation time: 58 minutes Operative details: After obtaining informed consent, the patient was taken to the [place] lab.  The patient was placed in supine position angio table.  Pre-existing sheath in the left groin was prepped and draped in usual sterile fashion.  Local anesthesia was infiltrated around the sheath exit site.  The obturator and the lytics catheter was then removed and an 035 versa core wire advanced to the lytics catheter all the way down into the below-knee popliteal space under fluoroscopy.  The lytics catheter was then removed.  Contrast angiogram was then obtained from the right groin all the way down to the distal outflow.  The profunda femoris is patent.  The right common femoral artery is patent.  The bypass graft originates from the common femoral artery and is uniformly patent without any retained thrombus all the way to the level of the distal anastomosis and the below-knee popliteal artery.  The peroneal artery is fairly diminutive but the anterior tibial and posterior tibial artery are all patent.  Magnified views of the distal anastomosis were performed in a Mag-Tab fashion and there was a bandlike stenosis adjacent to the distal anastomosis.  Patient was given 6000 units of intravenous heparin.  ACT was confirmed to be greater than 250.  The lesion was then angioplastied with a 6 x 40 abnormal Impact drug-eluting balloon to nominal pressure at 8 atm for 3 minutes.  Completion angiogram still showed some haziness in this area but the bandlike narrowing had disappeared and there was more brisk flow through the distal bypass graft.  Lateral foot view was obtained which showed there was good runoff all the way to the foot.  At this point the sheath was pulled back out of the left groin and swapped out for a short sheath.  This was done over the versa core wire.  This was thoroughly flushed with heparinized saline.  Patient was  taken back to her room with the left femoral sheath in place to be pulled there.  The patient tolerated procedure well and there were no complications.  She will be placed on Plavix and aspirin.  A loading dose of Plavix was given on the table. Operative management: Successful thrombolysis and angioplasty of distal anastomosis right femoropopliteal bypass.  Patient will be scheduled for a follow-up office visit with  duplex scan in 1 month.  She will be maintained on Plavix and aspirin. Ruta Hinds, MD [facility] [place] Office: [phone]  PERIPHERAL VASCULAR CATHETERIZATION  Result Date: 08/28/2020 Patient name: [name]           MRN: [id number]        DOB: Feb 21, 1956          Sex: female  08/27/2020 Pre-operative Diagnosis: Acute on chronic limb ischemia of the right lower extremity with occluded right common femoral to below-knee popliteal artery prosthetic bypass Post-operative diagnosis:  Same Surgeon:  Marty Heck, MD Procedure Performed: 1.  Ultrasound-guided access of left common femoral artery 2.  CO2 aortogram with catheter selection of aorta 3.  Right lower extremity arteriogram with selection of third order branches 4.  Placement of thrombolytic's catheter in the right common femoral to below-knee popliteal artery prosthetic bypass with initiation of thrombolysis (Alteplase) 5.  32 minutes of monitored moderate conscious sedation time  Indications: Patient is a 65 year old female who was seen yesterday by Dr. Fletcher Anon with new symptoms in the right lower extremity and a duplex in the office that indicated occluded right common femoral to below-knee popliteal prosthetic bypass previously performed by Dr. Oneida Alar.  Patient was admitted with plans for attempted thrombolysis and limb salvage today.  She presents today after risk benefits discussed  Findings:  CO2 aortogram showed a small infrarenal aorta with small iliacs but no overt flow-limiting stenosis.   Right common femoral and profunda were patent.  The right common femoral to below-knee popliteal artery prosthetic bypass was occluded with no flow.  Ultimately from left femoral access the right bypass was cannulated and was able to get a wire across the distal anastomosis of the bypass into the below-knee popliteal artery.  Patient did appear to have patent three-vessel runoff distally that were very small.  50 cm UniFuse catheter was placed in the bypass for initiation of thrombolysis overnight.             Procedure:  The patient was identified in the holding area and taken to room 8.  The patient was then placed supine on the table and prepped and draped in the usual sterile fashion.  A time out was called.  Ultrasound was used to evaluate the left common femoral artery.  It was patent .  A digital ultrasound image was acquired.  A micropuncture needle was used to access the left common femoral artery under ultrasound guidance.  An 018 wire was advanced without resistance and a micropuncture sheath was placed.  The 018 wire was removed and a benson wire was placed.  The micropuncture sheath was exchanged for a 5 french sheath.  An omniflush catheter was advanced over the wire to the level of L-1.  An abdominal angiogram was obtained with CO2.  Next, using the omniflush catheter it was pulled down in the aorta and we got bilateral iliac runoff and evaluation of the right leg bypass that was occluded.  I then was able to use a Glidewire advantage over the aortic bifurcation and into the right profunda to get a 6 [place] Ansell sheath over the aortic bifurcation into the right external iliac artery.  Patient was given additional 3000 units for IV heparin.  Confirmed ACT was 249.  I then used a BER 2 catheter with a Glidewire advantage to cannulate the right leg bypass and was able to cross the bypass and cross the distal anastomosis into the native below-knee  popliteal artery.  I confirmed on hand-injection that I  was in the true lumen of the vessel.  We selected a long 50 cm UniFuse catheter placed down the right leg bypass across the distal anastomosis into the native below-knee popliteal artery.  Will initiate alteplase at 1 mg an hour through unifuse catheter and heparin at 800 units an hour through the sheath.      Plan: Patient will go to the ICU for thrombolysis of right leg bypass.  Plan thrombolytics check tomorrow in the Cath Lab.  Marty Heck, MD [facility] [place] Office: [phone]    Cardiac Studies   TTE 2018: - Left ventricle: The cavity size was normal. Wall thickness was  normal. Systolic function was normal. The estimated ejection  fraction was in the range of 55% to 60%. Wall motion was normal;  there were no regional wall motion abnormalities. Doppler  parameters are consistent with both elevated ventricular  end-diastolic filling pressure and elevated left atrial filling  pressure.  - Mitral valve: Calcified annulus.  - Left atrium: The atrium was mildly dilated.  - Atrial septum: No defect or patent foramen ovale was identified.  - Pulmonary arteries: PA peak pressure: 46 mm Hg (S).   Patient Profile     65 y.o. female with a history of CAD s/p CABG in 2004, type 2 DM with diabetic neuropathy, CKD, moderate carotid stenosis, and PAD s/pleft external iliac/common femoral and profunda endarterectomy with extended profundoplasty, ligation of the left superficial femoral artery and femoral to below the knee popliteal artery bypass with a vein graft in 05/18, and more recently right femoral to below-knee popliteal artery bypass in August 2018 for critical limb ischemia. She had GI bleed on small dose Xarelto which was started for peripheral arterial disease. EGD was unremarkable. She was taken off Xarelto. She had a significant drop in ABI on the left side in 2019. Angiography in August 2019 showed patent left femoral to below the  knee popliteal artery bypass with discrete 80% stenosis in the mid to distal segment. This was treated successfully with drug-coated balloon angioplasty. She presents due to rest pain of her right lower extremity associated with a severe drop in ABI to 0.28.  Assessment & Plan      1. PAD with critical limb ischemia of the RLE, occluded femoral-popliteal bypass graft: Presented with worsening pain in RLE with discoloration and severe decline in ABIs. Vascular surgery consulted and seen by Dr. Arita Miss. Planned for PV angiogram this morning - she underwent a successful thrombolysis and angioplasty of distal anastomosis right femoropopliteal bypass.  Patient will be scheduled for a follow-up office visit with duplex scan in 1 month.  She will be maintained on Plavix and aspirin and statin - cath access site on the left femoral artery with minimal bruising, no bruit, no bleeding - she can be discharged toay, we will arrange for a follow up in the clinic - she is asking for pain meds at home, a limited supply of Tramadol 25 mg PO BID PRN, #10 pills can be provided  2. Hyponatremia: Na+ 127>>134 this morning -- treated with gentle IVFs overnight -- lasix held in the setting of need for angiogram -- no volume overload on exam  3. CKD stage IV: Cr 2.3>>2.0->1.9->1.7  this morning. Received IVFs overnight.  -- no ACE/ARB with renal dysfunction -- resume lasix -- replace potassium  4. Type 2 diabetes with peripheral neuropathy and severe hyperglycemia: Hgb A1c 12.2.  Home regimen 20-60 units BID per sliding scale? -- on SSI and lantus -- diabetes consult, will need much better control over A1c in the future  5. Hypertension: liable -- continue Carvedilol 12.5 BID and amodipine 56m (home medications)  6. CAD s/p CABG: 4v CABG in 2004, she has no angina -- Last EF in 2018 was normal. Last LDL was at goal. -- Continue aspirin, statin, and beta blocker  For questions or updates, please contact  CSharonPlease consult [email or web address] for contact info under      Signed, KEna Dawley MD  08/29/2020, 9:58 AM

## 2020-08-31 MED FILL — Heparin Sod (Porcine)-NaCl IV Soln 1000 Unit/500ML-0.9%: INTRAVENOUS | Qty: 500 | Status: AC

## 2020-09-02 ENCOUNTER — Ambulatory Visit (HOSPITAL_COMMUNITY)
Admission: RE | Admit: 2020-09-02 | Discharge: 2020-09-02 | Disposition: A | Discharge status: Home / Self Care | Payer: Medicare Other | Source: Ambulatory Visit | Attending: Cardiovascular Disease | Admitting: Cardiovascular Disease

## 2020-09-02 ENCOUNTER — Inpatient Hospital Stay (HOSPITAL_COMMUNITY): Admission: RE | Admit: 2020-09-02 | Payer: Medicare Other | Source: Ambulatory Visit

## 2020-09-02 ENCOUNTER — Other Ambulatory Visit: Payer: Self-pay | Admitting: Cardiovascular Disease

## 2020-09-02 ENCOUNTER — Other Ambulatory Visit: Payer: Self-pay

## 2020-09-02 DIAGNOSIS — I6523 Occlusion and stenosis of bilateral carotid arteries: Secondary | ICD-10-CM

## 2020-09-08 ENCOUNTER — Encounter: Payer: Self-pay | Admitting: Cardiovascular Disease

## 2020-09-08 ENCOUNTER — Other Ambulatory Visit: Payer: Self-pay

## 2020-09-08 ENCOUNTER — Other Ambulatory Visit: Payer: Self-pay | Admitting: *Deleted

## 2020-09-08 ENCOUNTER — Ambulatory Visit (INDEPENDENT_AMBULATORY_CARE_PROVIDER_SITE_OTHER): Payer: Medicare Other | Admitting: Cardiovascular Disease

## 2020-09-08 VITALS — BP 100/60 | HR 65 | Ht 64.0 in | Wt 129.8 lb

## 2020-09-08 DIAGNOSIS — I2510 Atherosclerotic heart disease of native coronary artery without angina pectoris: Secondary | ICD-10-CM | POA: Diagnosis not present

## 2020-09-08 DIAGNOSIS — I6523 Occlusion and stenosis of bilateral carotid arteries: Secondary | ICD-10-CM

## 2020-09-08 DIAGNOSIS — E785 Hyperlipidemia, unspecified: Secondary | ICD-10-CM

## 2020-09-08 DIAGNOSIS — I739 Peripheral vascular disease, unspecified: Secondary | ICD-10-CM | POA: Diagnosis not present

## 2020-09-08 DIAGNOSIS — I10 Essential (primary) hypertension: Secondary | ICD-10-CM | POA: Diagnosis not present

## 2020-09-08 DIAGNOSIS — R011 Cardiac murmur, unspecified: Secondary | ICD-10-CM

## 2020-09-08 MED ORDER — AMLODIPINE BESYLATE 2.5 MG PO TABS: 2.5000 mg | ORAL_TABLET | Freq: Every day | ORAL | 3 refills | Status: DC

## 2020-09-08 MED ORDER — HYDROCODONE-ACETAMINOPHEN 5-325 MG PO TABS: 1.0000 | ORAL_TABLET | Freq: Three times a day (TID) | ORAL | 0 refills | Status: DC | PRN

## 2020-09-08 NOTE — Patient Instructions (Signed)
Medication Instructions:  Decrease the Amlodipine to 2.5 mg once daily  *If you need a refill on your cardiac medications before your next appointment, please call your pharmacy*   Lab Work: None ordered If you have labs (blood work) drawn today and your tests are completely normal, you will receive your results only by: Marland Kitchen MyChart Message (if you have MyChart) OR . A paper copy in the mail If you have any lab test that is abnormal or we need to change your treatment, we will call you to review the results.   Testing/Procedures: None ordered   Follow-Up: At [facility], you and your health needs are our priority.  As part of our continuing mission to provide you with exceptional heart care, we have created designated Provider Care Teams.  These Care Teams include your primary Cardiologist (physician) and Advanced Practice Providers (APPs -  Physician Assistants and Nurse Practitioners) who all work together to provide you with the care you need, when you need it.  We recommend signing up for the patient portal called "MyChart".  Sign up information is provided on this After Visit Summary.  MyChart is used to connect with patients for Virtual Visits (Telemedicine).  Patients are able to view lab/test results, encounter notes, upcoming appointments, etc.  Non-urgent messages can be sent to your provider as well.   To learn more about what you can do with MyChart, go to [email or web address].    Your next appointment:   4 month(s)  The format for your next appointment:   In Person  Provider:   [name], MD

## 2020-09-08 NOTE — Progress Notes (Signed)
Cardiology Office Note   Date:  09/08/2020   ID:  [name], DOB December 18, 1955, MRN [id number]  PCP:  Nicholes Rough, PA-C  Cardiologist:   Dr. Stanford Breed  No chief complaint on file.     History of Present Illness: [name] is a 65 y.o. female who presents for a followup visit regarding peripheral arterial disease. She has known history of  coronary artery status post coronary bypass graft surgery in 2004. She is known to have moderate bilateral carotid stenosis.  She also has prolonged history of diabetes with  chronic kidney disease and  diabetic neuropathy.  She is s/p left external iliac/common femoral and profunda endarterectomy with extended profundoplasty, ligation of the left superficial femoral artery and femoral to below the knee popliteal artery bypass with a vein graft in 05/18 and more recently right femoral to below-knee popliteal artery bypass in August, 2018.  Revascularization was performed for critical limb ischemia. She had GI bleed on small dose Xarelto which was started for peripheral arterial disease.  EGD was unremarkable.  She was taken off Xarelto.   She had a significant drop in ABI on the left side in 2019.  CO2 angiography in August 2019 showed patent left femoral to below the knee popliteal artery bypass with discrete 80% stenosis in the mid to distal segment.  This was treated successfully with drug-coated balloon angioplasty.   She recently fell and started having right foot pain and was being seen by orthopedic surgery.  She started having rest pain and thus I repeated her vascular studies which showed subacute occlusion of the right femoral-popliteal bypass with significant drop in ABI.  The patient was hospitalized and started on heparin.  She underwent angiography by Dr. Carlis Abbott which confirmed occlusion of the graft on the right side.  She had a lysis catheter placed and then an angiogram was repeated by Dr. Oneida Alar next day which showed improvement in flow.   The patient underwent balloon angioplasty to the distal anastomosis. She reports significant improvement in right foot pain although she does have gangrenous changes on the first 2 toes.  She has been having issues with low blood pressure and orthostatic dizziness which might have led to her falls.  Her blood pressure is low and she continues to lose weight.   Past Medical History:  Diagnosis Date  . Adenomatous polyp of colon   . Anemia   . Arthritis   . Asthma   . Carotid artery disease ([place])   . Carpal tunnel syndrome   . Chronic renal insufficiency    Stage 3 Kidney disease  . Coronary artery disease   . Depression with anxiety   . Diabetes mellitus    type II  . Diverticulosis of colon (without mention of hemorrhage)   . GERD (gastroesophageal reflux disease)   . Heart murmur   . History of hiatal hernia   . Hyperlipidemia   . Hypertension   . Hypothyroidism   . PVD (peripheral vascular disease) ([place])     Past Surgical History:  Procedure Laterality Date  . ABDOMINAL AORTAGRAM N/A 12/18/2013   Procedure: ABDOMINAL [name];  Surgeon: Wellington Hampshire, MD;  Location: [facility];  Service: Cardiovascular;  Laterality: N/A;  . ABDOMINAL AORTOGRAM W/LOWER EXTREMITY N/A 11/16/2016   Procedure: Abdominal Aortogram w/Lower Extremity;  Surgeon: Wellington Hampshire, MD;  Location: [facility];  Service: Cardiovascular;  Laterality: N/A;  . ABDOMINAL AORTOGRAM W/LOWER EXTREMITY N/A 01/25/2017   Procedure:  Abdominal Aortogram w/Lower Extremity;  Surgeon: Wellington Hampshire, MD;  Location: [facility];  Service: Cardiovascular;  Laterality: N/A;  only completed Lower Extremity  . ABDOMINAL AORTOGRAM W/LOWER EXTREMITY N/A 02/28/2018   Procedure: ABDOMINAL AORTOGRAM W/LOWER EXTREMITY;  Surgeon: Wellington Hampshire, MD;  Location: [facility];  Service: Cardiovascular;  Laterality: N/A;  . ABDOMINAL AORTOGRAM W/LOWER EXTREMITY N/A 08/27/2020   Procedure: ABDOMINAL AORTOGRAM  W/LOWER EXTREMITY;  Surgeon: Marty Heck, MD;  Location: [facility];  Service: Cardiovascular;  Laterality: N/A;  . ABDOMINAL HYSTERECTOMY    . APPENDECTOMY    . APPLICATION OF WOUND VAC Right 02/13/2017   Procedure: APPLICATION OF INCISIONAL WOUND VAC RIGHT GROIN;  Surgeon: Elam Dutch, MD;  Location: [facility];  Service: Vascular;  Laterality: Right;  . CHOLECYSTECTOMY    . CORONARY ARTERY BYPASS GRAFT  2004   x4  . ENDARTERECTOMY FEMORAL Left 11/23/2016   Procedure: ENDARTERECTOMY OF LEFT EXTERNAL COMMON FEMORAL ARTERY WITH EXTENDED LEFT PROFUNDOPLASTY;  Surgeon: Elam Dutch, MD;  Location: [facility];  Service: Vascular;  Laterality: Left;  . ENDARTERECTOMY FEMORAL Right 02/13/2017   Procedure: ENDARTERECTOMY RIGHT FEMORAL ARTERY WITH PROFUNDAPLASTY;  Surgeon: Elam Dutch, MD;  Location: [facility] OR;  Service: Vascular;  Laterality: Right;  . ESOPHAGOGASTRODUODENOSCOPY N/A 08/02/2017   Procedure: ESOPHAGOGASTRODUODENOSCOPY (EGD);  Surgeon: Ladene Artist, MD;  Location: [facility];  Service: Endoscopy;  Laterality: N/A;  . FEMORAL-POPLITEAL BYPASS GRAFT Left 11/23/2016   Procedure: LEFT FEMORAL-BELOW KNEE POPLITEAL ARTERY BYPASS;  Surgeon: Elam Dutch, MD;  Location: [facility];  Service: Vascular;  Laterality: Left;  . FEMORAL-POPLITEAL BYPASS GRAFT Right 02/13/2017   Procedure: BYPASS GRAFT RIGHT FEMORAL-BELOW KNEE POPLITEAL ARTERY;  Surgeon: Elam Dutch, MD;  Location: [facility];  Service: Vascular;  Laterality: Right;  . KNEE SURGERY Right 2013   arthroscopy  . PATCH ANGIOPLASTY Right 02/13/2017   Procedure: VEIN PATCH ANGIOPLASTY RIGHT POPLITEAL ARTERY AND HEMASHIELD PATCH ANGIOPLASTY OF RIGHT FEMORAL ARTERY;  Surgeon: Elam Dutch, MD;  Location: [facility];  Service: Vascular;  Laterality: Right;  . PERIPHERAL VASCULAR BALLOON ANGIOPLASTY  02/28/2018   Procedure: PERIPHERAL VASCULAR BALLOON ANGIOPLASTY;  Surgeon: Wellington Hampshire, MD;  Location: [facility];   Service: Cardiovascular;;  Left Fem-pop Bypass  . PERIPHERAL VASCULAR INTERVENTION Right 08/28/2020   Procedure: PERIPHERAL VASCULAR INTERVENTION;  Surgeon: Elam Dutch, MD;  Location: [facility];  Service: Cardiovascular;  Laterality: Right;  . PERIPHERAL VASCULAR THROMBECTOMY Right 08/28/2020   Procedure: LYSIS RECHECK;  Surgeon: Elam Dutch, MD;  Location: [facility];  Service: Cardiovascular;  Laterality: Right;     Current Outpatient Medications  Medication Sig Dispense Refill  . acetaminophen-codeine (TYLENOL #3) 300-30 MG tablet Take 1-2 tablets by mouth every 6 (six) hours as needed for moderate pain. 15 tablet 0  . albuterol (PROVENTIL HFA;VENTOLIN HFA) 108 (90 BASE) MCG/ACT inhaler Inhale 2 puffs into the lungs every 6 (six) hours as needed for wheezing or shortness of breath.    . ALPRAZolam (XANAX) 1 MG tablet Take 1 mg by mouth 3 (three) times daily as needed for anxiety.    [name] Kitchen aspirin EC 81 MG tablet Take 81 mg by mouth at bedtime.    [name] Kitchen atorvastatin (LIPITOR) 80 MG tablet Take 80 mg by mouth daily.     . calcitRIOL (ROCALTROL) 0.25 MCG capsule Take 0.25 mcg by mouth every Monday, Wednesday, and Friday.   6  .  carvedilol (COREG) 12.5 MG tablet TAKE 1 TABLET BY MOUTH TWICE A DAY (Patient taking differently: Take 12.5 mg by mouth 2 (two) times daily with a meal.) 180 tablet 0  . cetirizine (ZYRTEC) 10 MG tablet Take 10 mg by mouth daily.  6  . clopidogrel (PLAVIX) 75 MG tablet Take 1 tablet (75 mg total) by mouth daily with breakfast. 30 tablet 2  . famotidine (PEPCID) 40 MG tablet TAKE 1 TABLET BY MOUTH EVERY DAY (Patient taking differently: Take 40 mg by mouth daily.) 90 tablet 1  . fenofibrate micronized (LOFIBRA) 200 MG capsule Take 200 mg by mouth daily.    . ferrous sulfate 325 (65 FE) MG tablet Take 325 mg by mouth 3 (three) times daily.  3  . furosemide (LASIX) 40 MG tablet Take 40 mg by mouth daily.     . Insulin Glargine (BASAGLAR KWIKPEN) 100  UNIT/ML SOPN Inject 20-60 Units into the skin 2 (two) times daily before a meal. Per sliding scale  3  . Levothyroxine Sodium 112 MCG CAPS Take 112 mcg by mouth daily before breakfast.    . Omega-3 Fatty Acids (FISH OIL) 1000 MG CAPS Take 1,000 mg by mouth 2 (two) times daily.    [name] Kitchen PARoxetine (PAXIL) 40 MG tablet Take 60 mg by mouth at bedtime.     . promethazine (PHENERGAN) 25 MG tablet Take 25 mg by mouth daily as needed for nausea or vomiting.   6  . tiZANidine (ZANAFLEX) 4 MG tablet Take 1 tablet (4 mg total) by mouth every 6 (six) hours as needed for muscle spasms. 30 tablet 0  . Vitamin D, Ergocalciferol, (DRISDOL) 50000 UNITS CAPS capsule Take 50,000 Units by mouth 2 (two) times a week. Takes on Mondays and Fridays  1  . amLODipine (NORVASC) 2.5 MG tablet Take 1 tablet (2.5 mg total) by mouth daily. 90 tablet 3  . HYDROcodone-acetaminophen (NORCO/VICODIN) 5-325 MG tablet Take 1 tablet by mouth every 8 (eight) hours as needed. 15 tablet 0   No current facility-administered medications for this visit.    Allergies:   Patient has no known allergies.    Social History:  The patient  reports that she quit smoking about 4 years ago. Her smoking use included cigarettes. She has a 30.00 pack-year smoking history. She has never used smokeless tobacco. She reports that she does not drink alcohol and does not use drugs.   Family History:  The patient's family history includes Colon polyps in her daughter; Diabetes in her mother; Heart disease in her father.    ROS:  Please see the history of present illness.   Otherwise, review of systems are positive for none.   All other systems are reviewed and negative.    PHYSICAL EXAM: VS:  BP 100/60   Pulse 65   Ht 5\' 4"  (1.626 m)   Wt 129 lb 12.8 oz (58.9 kg)   BMI 22.28 kg/m  , BMI Body mass index is 22.28 kg/m. GEN: Well nourished, well developed, in no acute distress  HEENT: normal  Neck: no JVD,  or masses. Bilateral carotid  bruits. Cardiac: RRR; no rubs, or gallops,no edema . 2/6 systolic ejection murmur in the aortic area. Respiratory:  clear to auscultation bilaterally, normal work of breathing GI: soft, nontender, nondistended, + BS MS: no deformity or atrophy  Skin: warm and dry, no rash Neuro:  Strength and sensation are intact Psych: euthymic mood, full affect Dorsalis pedis is palpable bilaterally.  Gangrenous changes on the  first 2 toes on the right side.  EKG:  EKG is not ordered today.  EKG showed ectopic atrial rhythm with incomplete right bundle branch block and nonspecific T wave changes.   Recent Labs: 08/26/2020: ALT 11 08/29/2020: BUN 24; Creatinine, Ser 1.72; Hemoglobin 9.9; Platelets 213; Potassium 3.4; Sodium 139    Lipid Panel    Component Value Date/Time   CHOL 125 03/10/2020 1112   TRIG 144 03/10/2020 1112   HDL 46 03/10/2020 1112   CHOLHDL 2.7 03/10/2020 1112   LDLCALC 54 03/10/2020 1112      Wt Readings from Last 3 Encounters:  09/08/20 129 lb 12.8 oz (58.9 kg)  08/29/20 132 lb 7.9 oz (60.1 kg)  03/10/20 143 lb (64.9 kg)         ASSESSMENT AND PLAN:  1. Peripheral arterial disease status post successful surgical revascularization of bilateral lower extremities.   Status post subacute closure of the right femoral-popliteal bypass which was salvaged with lysis and balloon angioplasty to the distal anastomosis.  She has palpable dorsalis pedis pulse there.  She might ultimately require amputation of the first 2 toes on the right.  She has follow-up with vascular in few weeks.  Continue dual antiplatelet therapy. Given continued significant pain in the right foot, I agreed to give her 15 more tablets of Vicodin.  2. Coronary artery disease: She has no chest pain. Stable exertional dyspnea.  3. Essential hypertension: Blood pressure has been running low likely due to continued weight loss.  This might have contributed to her recent fall given significant orthostatic  dizziness.  I decreased amlodipine to 2.5 mg once daily.  4.  Mixed hyperlipidemia: Currently on atorvastatin and fenofibrate .  Lipid profile last year showed excellent control with LDL of 54 and triglyceride of 144.  5. Bilateral carotid stenosis: Recent carotid Doppler showed mild bilateral disease.    7.  Cardiac murmur suggestive of mild aortic stenosis: We will plan echocardiogram next year.   Disposition:   FU with me in 4 months  Signed,  [name], MD  09/08/2020 10:58 AM    [place]

## 2020-09-28 ENCOUNTER — Other Ambulatory Visit: Payer: Self-pay | Admitting: *Deleted

## 2020-09-28 DIAGNOSIS — Z95828 Presence of other vascular implants and grafts: Secondary | ICD-10-CM

## 2020-09-28 DIAGNOSIS — I739 Peripheral vascular disease, unspecified: Secondary | ICD-10-CM

## 2020-09-28 DIAGNOSIS — I779 Disorder of arteries and arterioles, unspecified: Secondary | ICD-10-CM

## 2020-09-30 ENCOUNTER — Ambulatory Visit (HOSPITAL_COMMUNITY)
Admission: RE | Admit: 2020-09-30 | Discharge: 2020-09-30 | Disposition: A | Discharge status: Home / Self Care | Payer: Medicare Other | Source: Ambulatory Visit | Attending: Vascular Surgery | Admitting: Vascular Surgery

## 2020-09-30 ENCOUNTER — Other Ambulatory Visit: Payer: Self-pay

## 2020-09-30 ENCOUNTER — Ambulatory Visit (INDEPENDENT_AMBULATORY_CARE_PROVIDER_SITE_OTHER): Payer: Medicare Other | Admitting: Physician Assistant

## 2020-09-30 ENCOUNTER — Ambulatory Visit (INDEPENDENT_AMBULATORY_CARE_PROVIDER_SITE_OTHER)
Admit: 2020-09-30 | Discharge: 2020-09-30 | Disposition: A | Discharge status: Home / Self Care | Payer: Medicare Other | Attending: Vascular Surgery | Admitting: Vascular Surgery

## 2020-09-30 VITALS — BP 154/65 | HR 69 | Temp 97.9°F | Resp 20 | Ht 64.0 in | Wt 128.4 lb

## 2020-09-30 DIAGNOSIS — Z95828 Presence of other vascular implants and grafts: Secondary | ICD-10-CM

## 2020-09-30 DIAGNOSIS — I739 Peripheral vascular disease, unspecified: Secondary | ICD-10-CM

## 2020-09-30 DIAGNOSIS — I779 Disorder of arteries and arterioles, unspecified: Secondary | ICD-10-CM

## 2020-09-30 NOTE — Progress Notes (Signed)
HISTORY AND PHYSICAL     CC:  follow up. Requesting Provider:  Nicholes Rough, PA-C  HPI: This is a 65 y.o. female who is here today for follow up for PAD.  She is status post left femoral endarterectomy and femoral below knee popliteal bypass with non-reversed vein on 11-23-2016 for a foot wound, and right femoral endarterectomy and dacron patch angioplasty and femoral to below knee bypass with PTFE and vein patch to the popliteal artery for a right foot wound on 02/13/2017.  These were performed by Dr Oneida Alar.  In February, she had recently fallen and sprained her ankle.  She had right leg pain and had duplex and her bypass graft was occluded.  She subsequently underwent thrombolysis .  She states that before her graft went down, she had low blood pressure due to a medication and felt bad/dizzy and feels this is why her bypass occluded. She has since discontinued that medication.  She has hx of poorly controlled DM, HTN, CAD with hx of CABG in 2003.  The pt returns today for follow up.  She states she is still having pain in her toes.  She states she is keeping her feet moisturized.  She states that her toes are about the same as when she came in when her bypass graft was occluded.  They are dry and haven't been draining.  She is currently using a walking boot for stabilization when she is walking.  She is taking her asa and statin.  she states she is taking Vicodin twice a day when needed for pain.  She is not currently smoking.    She states she currently has a UTI that is being treated.    The pt is on a statin for cholesterol management.    The pt is on an aspirin.    Other AC:  Plavix The pt is on CCB, BB for hypertension.  The pt does have diabetes. Tobacco hx:  former  Pt does not have family hx of AAA.  Past Medical History:  Diagnosis Date  . Adenomatous polyp of colon   . Anemia   . Arthritis   . Asthma   . Carotid artery disease ([place])   . Carpal tunnel syndrome   . Chronic  renal insufficiency    Stage 3 Kidney disease  . Coronary artery disease   . Depression with anxiety   . Diabetes mellitus    type II  . Diverticulosis of colon (without mention of hemorrhage)   . GERD (gastroesophageal reflux disease)   . Heart murmur   . History of hiatal hernia   . Hyperlipidemia   . Hypertension   . Hypothyroidism   . PVD (peripheral vascular disease) ([place])     Past Surgical History:  Procedure Laterality Date  . ABDOMINAL AORTAGRAM N/A 12/18/2013   Procedure: ABDOMINAL [name];  Surgeon: Wellington Hampshire, MD;  Location: [place] CATH LAB;  Service: Cardiovascular;  Laterality: N/A;  . ABDOMINAL AORTOGRAM W/LOWER EXTREMITY N/A 11/16/2016   Procedure: Abdominal Aortogram w/Lower Extremity;  Surgeon: Wellington Hampshire, MD;  Location: [place] CV [facility];  Service: Cardiovascular;  Laterality: N/A;  . ABDOMINAL AORTOGRAM W/LOWER EXTREMITY N/A 01/25/2017   Procedure: Abdominal Aortogram w/Lower Extremity;  Surgeon: Wellington Hampshire, MD;  Location: [place] CV [facility];  Service: Cardiovascular;  Laterality: N/A;  only completed Lower Extremity  . ABDOMINAL AORTOGRAM W/LOWER EXTREMITY N/A 02/28/2018   Procedure: ABDOMINAL AORTOGRAM W/LOWER EXTREMITY;  Surgeon: Wellington Hampshire, MD;  Location: [facility]  INVASIVE CV LAB;  Service: Cardiovascular;  Laterality: N/A;  . ABDOMINAL AORTOGRAM W/LOWER EXTREMITY N/A 08/27/2020   Procedure: ABDOMINAL AORTOGRAM W/LOWER EXTREMITY;  Surgeon: Marty Heck, MD;  Location: [place] CV LAB;  Service: Cardiovascular;  Laterality: N/A;  . ABDOMINAL HYSTERECTOMY    . APPENDECTOMY    . APPLICATION OF WOUND VAC Right 02/13/2017   Procedure: APPLICATION OF INCISIONAL WOUND VAC RIGHT GROIN;  Surgeon: Elam Dutch, MD;  Location: [place];  Service: Vascular;  Laterality: Right;  . CHOLECYSTECTOMY    . CORONARY ARTERY BYPASS GRAFT  2004   x4  . ENDARTERECTOMY FEMORAL Left 11/23/2016   Procedure: ENDARTERECTOMY OF LEFT EXTERNAL COMMON FEMORAL ARTERY  WITH EXTENDED LEFT PROFUNDOPLASTY;  Surgeon: Elam Dutch, MD;  Location: [place];  Service: Vascular;  Laterality: Left;  . ENDARTERECTOMY FEMORAL Right 02/13/2017   Procedure: ENDARTERECTOMY RIGHT FEMORAL ARTERY WITH PROFUNDAPLASTY;  Surgeon: Elam Dutch, MD;  Location: [facility] OR;  Service: Vascular;  Laterality: Right;  . ESOPHAGOGASTRODUODENOSCOPY N/A 08/02/2017   Procedure: ESOPHAGOGASTRODUODENOSCOPY (EGD);  Surgeon: Ladene Artist, MD;  Location[facility] ENDOSCOPY;  Service: Endoscopy;  Laterality: N/A;  . FEMORAL-POPLITEAL BYPASS GRAFT Left 11/23/2016   Procedure: LEFT FEMORAL-BELOW KNEE POPLITEAL ARTERY BYPASS;  Surgeon: Elam Dutch, MD;  Location: [place];  Service: Vascular;  Laterality: Left;  . FEMORAL-POPLITEAL BYPASS GRAFT Right 02/13/2017   Procedure: BYPASS GRAFT RIGHT FEMORAL-BELOW KNEE POPLITEAL ARTERY;  Surgeon: Elam Dutch, MD;  Location: [place];  Service: Vascular;  Laterality: Right;  . KNEE SURGERY Right 2013   arthroscopy  . PATCH ANGIOPLASTY Right 02/13/2017   Procedure: VEIN PATCH ANGIOPLASTY RIGHT POPLITEAL ARTERY AND HEMASHIELD PATCH ANGIOPLASTY OF RIGHT FEMORAL ARTERY;  Surgeon: Elam Dutch, MD;  Location: [place];  Service: Vascular;  Laterality: Right;  . PERIPHERAL VASCULAR BALLOON ANGIOPLASTY  02/28/2018   Procedure: PERIPHERAL VASCULAR BALLOON ANGIOPLASTY;  Surgeon: Wellington Hampshire, MD;  Location: [place] CV LAB;  Service: Cardiovascular;;  Left Fem-pop Bypass  . PERIPHERAL VASCULAR INTERVENTION Right 08/28/2020   Procedure: PERIPHERAL VASCULAR INTERVENTION;  Surgeon: Elam Dutch, MD;  Location: [place] CV LAB;  Service: Cardiovascular;  Laterality: Right;  . PERIPHERAL VASCULAR THROMBECTOMY Right 08/28/2020   Procedure: LYSIS RECHECK;  Surgeon: Elam Dutch, MD;  Location: [place] CV LAB;  Service: Cardiovascular;  Laterality: Right;    Allergies  Allergen Reactions  . Dapagliflozin     Other reaction(s): Other Syncope     Current Outpatient Medications  Medication Sig Dispense Refill  . albuterol (PROVENTIL HFA;VENTOLIN HFA) 108 (90 BASE) MCG/ACT inhaler Inhale 2 puffs into the lungs every 6 (six) hours as needed for wheezing or shortness of breath.    . ALPRAZolam (XANAX) 1 MG tablet Take 1 mg by mouth 3 (three) times daily as needed for anxiety.    Marland Kitchen amLODipine (NORVASC) 2.5 MG tablet Take 1 tablet (2.5 mg total) by mouth daily. 90 tablet 3  . aspirin EC 81 MG tablet Take 81 mg by mouth at bedtime.    Marland Kitchen atorvastatin (LIPITOR) 80 MG tablet Take 80 mg by mouth daily.     . calcitRIOL (ROCALTROL) 0.25 MCG capsule Take 0.25 mcg by mouth every Monday, Wednesday, and Friday.   6  . carvedilol (COREG) 12.5 MG tablet TAKE 1 TABLET BY MOUTH TWICE A DAY (Patient taking differently: Take 12.5 mg by mouth 2 (two) times daily with a meal.) 180 tablet 0  . cetirizine (ZYRTEC) 10 MG tablet Take  10 mg by mouth daily.  6  . clopidogrel (PLAVIX) 75 MG tablet Take 1 tablet (75 mg total) by mouth daily with breakfast. 30 tablet 2  . famotidine (PEPCID) 40 MG tablet TAKE 1 TABLET BY MOUTH EVERY DAY (Patient taking differently: Take 40 mg by mouth daily.) 90 tablet 1  . fenofibrate micronized (LOFIBRA) 200 MG capsule Take 200 mg by mouth daily.    . ferrous sulfate 325 (65 FE) MG tablet Take 325 mg by mouth 3 (three) times daily.  3  . furosemide (LASIX) 40 MG tablet Take 40 mg by mouth daily.     . Insulin Glargine (BASAGLAR KWIKPEN) 100 UNIT/ML SOPN Inject 20-60 Units into the skin 2 (two) times daily before a meal. Per sliding scale  3  . Levothyroxine Sodium 112 MCG CAPS Take 112 mcg by mouth daily before breakfast.    . Omega-3 Fatty Acids (FISH OIL) 1000 MG CAPS Take 1,000 mg by mouth 2 (two) times daily.    Marland Kitchen PARoxetine (PAXIL) 40 MG tablet Take 60 mg by mouth at bedtime.     . promethazine (PHENERGAN) 25 MG tablet Take 25 mg by mouth daily as needed for nausea or vomiting.   6  . tiZANidine (ZANAFLEX) 4 MG tablet Take 1  tablet (4 mg total) by mouth every 6 (six) hours as needed for muscle spasms. 30 tablet 0  . Vitamin D, Ergocalciferol, (DRISDOL) 50000 UNITS CAPS capsule Take 50,000 Units by mouth 2 (two) times a week. Takes on Mondays and Fridays  1  . acetaminophen-codeine (TYLENOL #3) 300-30 MG tablet Take 1-2 tablets by mouth every 6 (six) hours as needed for moderate pain. (Patient not taking: Reported on 09/30/2020) 15 tablet 0  . HYDROcodone-acetaminophen (NORCO/VICODIN) 5-325 MG tablet Take 1 tablet by mouth every 8 (eight) hours as needed. (Patient not taking: Reported on 09/30/2020) 15 tablet 0   No current facility-administered medications for this visit.    Family History  Problem Relation Age of Onset  . Diabetes Mother   . Heart disease Father   . Colon polyps Daughter   . Colon cancer Neg Hx   . Esophageal cancer Neg Hx   . Stomach cancer Neg Hx   . Rectal cancer Neg Hx     Social History   Socioeconomic History  . Marital status: Widowed    Spouse name: Not on file  . Number of children: 2  . Years of education: Not on file  . Highest education level: Not on file  Occupational History  . 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 Smoking status: Former Smoker    Packs/day: 1.00    Years: 30.00    Pack years: 30.00    Types: Cigarettes    Quit date: 2018    Years since quitting: 4.2  . Smokeless tobacco: Never Used  Vaping Use  . Vaping Use: Never used  Substance and Sexual Activity  . Alcohol use: No    Alcohol/week: 0.0 standard drinks  . Drug use: No  . Sexual activity: Not on file  Other Topics Concern  . Not on file  Social History Narrative   Daily Caffeine   Social Determinants of Health   Financial Resource Strain: Not on file  Food Insecurity: Not on file  Transportation Needs: Not on file  Physical Activity: Not on file  Stress: Not on file  Social Connections: Not on file  Intimate Partner  Violence: Not on file     REVIEW OF SYSTEMS:   [X]  denotes positive finding, [ ]  denotes negative finding Cardiac  Comments:  Chest pain or chest pressure:    Shortness of breath upon exertion:    Short of breath when lying flat:    Irregular heart rhythm:        Vascular    Pain in calf, thigh, or hip brought on by ambulation:    Pain in feet at night that wakes you up from your sleep:     Blood clot in your veins:    Leg swelling:         Pulmonary    Oxygen at home:    Productive cough:     Wheezing:         Neurologic    Sudden weakness in arms or legs:     Sudden numbness in arms or legs:     Sudden onset of difficulty speaking or slurred speech:    Temporary loss of vision in one eye:     Problems with dizziness:         Gastrointestinal    Blood in stool:     Vomited blood:         Genitourinary    Burning when urinating:     Blood in urine:        Psychiatric    Major depression:         Hematologic    Bleeding problems:    Problems with blood clotting too easily:        Skin    Rashes or ulcers: x       Constitutional    Fever or chills:      PHYSICAL EXAMINATION:  Today's Vitals   09/30/20 1438  BP: (!) 154/65  Pulse: 69  Resp: 20  Temp: 97.9 F (36.6 C)  TempSrc: Temporal  SpO2: 99%  Weight: 128 lb 6.4 oz (58.2 kg)  Height: 5\' 4"  (1.626 m)  PainSc: 8   PainLoc: Foot   Body mass index is 22.04 kg/m.   General:  WDWN in NAD; vital signs documented above Gait: Not observed HENT: WNL, normocephalic Pulmonary: normal non-labored breathing , without wheezing Cardiac: regular HR, with Murmur; with carotid bruit on the right Abdomen: soft, NT, no masses; aortic pulse is not palpable Skin: without rashes Vascular Exam/Pulses:  Right Left  Radial 2+ (normal) 2+ (normal)  Femoral 2+ (normal) 2+ (normal)  DP 2+ (normal) 2+ (normal)  PT Unable to palpate Unable to palpate   Extremities:  Gangrenous right great and 2nd  toes.      Musculoskeletal: no muscle wasting or atrophy  Neurologic: A&O X 3;  No focal weakness or paresthesias are detected Psychiatric:  The pt has Normal affect.   Non-Invasive Vascular Imaging:   ABI's/TBI's on 09/30/2020: Right:  1.01/absent - Great toe pressure: n/a Left:  1.01/0.98 - Great toe pressure: 166  RLE Arterial duplex on 09/30/2020: Right Graft #1:  +------------------+--------+--------+--------+--------+           PSV cm/sStenosisWaveformComments  +------------------+--------+--------+--------+--------+  Inflow      125       biphasic      +------------------+--------+--------+--------+--------+  Prox Anastomosis 117       biphasic      +------------------+--------+--------+--------+--------+  Proximal Graft  39       biphasic      +------------------+--------+--------+--------+--------+  Mid Graft     37  biphasic      +------------------+--------+--------+--------+--------+  Distal Graft   43       biphasic      +------------------+--------+--------+--------+--------+  Distal Anastomosis60       biphasic      +------------------+--------+--------+--------+--------+  Outflow      91       biphasic      +------------------+--------+--------+--------+--------+   Summary:  Right: Patent Fem Pop bypass graft  Previous ABI's/TBI's on 08/26/2020: Right:  0.28/0 - Great toe pressure: 0 Left:  0.84/0.69 - Great toe pressure:  120  Previous arterial duplex on 08/26/2020: +-----------+--------+-----+--------+-------------------+--------+  RIGHT   PSV cm/sRatioStenosisWaveform      Comments  +-----------+--------+-----+--------+-------------------+--------+  CFA Prox  82          barely triphasic        +-----------+--------+-----+--------+-------------------+--------+   CFA Distal 81          monophasic           +-----------+--------+-----+--------+-------------------+--------+  DFA    165          monophasic           +-----------+--------+-----+--------+-------------------+--------+  ATA Prox  18          dampened monophasic      +-----------+--------+-----+--------+-------------------+--------+  ATA Mid  19          monophasic           +-----------+--------+-----+--------+-------------------+--------+  ATA Distal 13          monophasic           +-----------+--------+-----+--------+-------------------+--------+  PTA Prox  25          monophasic           +-----------+--------+-----+--------+-------------------+--------+  PTA Mid  22          monophasic           +-----------+--------+-----+--------+-------------------+--------+  PTA Distal 15          monophasic           +-----------+--------+-----+--------+-------------------+--------+  PERO Prox 8           dampened monophasic      +-----------+--------+-----+--------+-------------------+--------+  PERO Mid  8           monophasic           +-----------+--------+-----+--------+-------------------+--------+  PERO Distal8           monophasic           +-----------+--------+-----+--------+-------------------+--------+   Right pedal artery 269 ms, category 4 severe ischemia.     Right Graft #1: FEM-POP BPG  +------------------+--------+--------+----------+--------+           PSV cm/sStenosisWaveform Comments  +------------------+--------+--------+----------+--------+  Inflow      81       monophasic      +------------------+--------+--------+----------+--------+  Prox Anastomosis      occluded           +------------------+--------+--------+----------+--------+  Proximal Graft      occluded           +------------------+--------+--------+----------+--------+  Mid Graft         occluded           +------------------+--------+--------+----------+--------+  Distal Graft       occluded           +------------------+--------+--------+----------+--------+  Distal Anastomosis28       monophasic      +------------------+--------+--------+----------+--------+  Outflow      29       monophasic      +------------------+--------+--------+----------+--------+   OCCLUDED BPG     +----------+--------+-----+--------+----------------+--------+  LEFT   PSV cm/sRatioStenosisWaveform    Comments  +----------+--------+-----+--------+----------------+--------+  CFA Prox 210          biphasic          +----------+--------+-----+--------+----------------+--------+  CFA Distal148          biphasic          +----------+--------+-----+--------+----------------+--------+  DFA    103          barely triphasic      +----------+--------+-----+--------+----------------+--------+  SFA Prox 148                        +----------+--------+-----+--------+----------------+--------+  TP Trunk 161          biphasic          +----------+--------+-----+--------+----------------+--------+     Left Graft #1: FEM-POP BPG  +--------------------+--------+--------+--------+--------+            PSV cm/sStenosisWaveformComments  +--------------------+--------+--------+--------+--------+  Inflow       148       biphasic      +--------------------+--------+--------+--------+--------+  Proximal Anastomosis160        biphasic      +--------------------+--------+--------+--------+--------+  Proximal Graft   48       biphasic      +--------------------+--------+--------+--------+--------+  Mid Graft      69       biphasic      +--------------------+--------+--------+--------+--------+  Distal Graft    51       biphasic      +--------------------+--------+--------+--------+--------+  Distal Anastomosis 139       biphasic      +--------------------+--------+--------+--------+--------+  Outflow       161       biphasicTPT     +--------------------+--------+--------+--------+--------+   Patent BPG, no stenosis seen.   Findings reported to Dr. Tyrell Antonio email through [facility] and [place], RN in  [place] at 4:40 pm.    Summary:  Right: Severe progression is noted compared to previous study. Occluded  FEM-POP BPG.   Left: No significant change as compared to previous study. Patent FEM-POP  BPG.    ASSESSMENT/PLAN:: 65 y.o. female here for follow up for PAD with hx of left femoral endarterectomy and femoral below knee popliteal bypass with non-reversed vein on 11-23-2016 for a foot wound, and right femoral endarterectomy and dacron patch angioplasty and femoral to below knee bypass with PTFE and vein patch to the popliteal artery for a right foot wound on 02/13/2017 and subsequent thrombolysis of the right leg bypass February 2022 who presents today for follow up.    PAD -pt has palpable DP pulses bilaterally today.  Her duplex reveals her bypass is patent. She does have dry gangrenous right great and 2nd toes that continue to demarcate.   -discussed with pt that we will bring her back in 3-4 weeks to check her toes on a day that Dr. Oneida Alar is here.  Discussed with she and her daughter that she may require amputation of toes depending on demarcation.  They understand that if they start becoming wet, soupy or  she develops fever/chills that they need to come back sooner. -discussed with her to hold off on driving until she returns to see [place] in 3-4 weeks given she is wearing a boot.  She expressed understanding. --PDMP reviewed.  Pt just received Vicodin from PCP on 3/18 therefore will not prescribe narcotics today. -she will need duplex of the LLE in one year.   -will need duplex of RLE in about 3 months  Right carotid bruit: -pt recently had carotid duplex that revealed 1-39% bilaterally.  Given the calcification present, it was recommended to repeat in one year.     Leontine Locket, [facility] [phone]  Clinic MD:   Scot Dock

## 2020-10-02 ENCOUNTER — Inpatient Hospital Stay: Admission: RE | Admit: 2020-10-02 | Payer: Medicare Other | Source: Ambulatory Visit

## 2020-10-22 ENCOUNTER — Ambulatory Visit (INDEPENDENT_AMBULATORY_CARE_PROVIDER_SITE_OTHER): Payer: Medicare Other | Admitting: Physician Assistant

## 2020-10-22 ENCOUNTER — Other Ambulatory Visit: Payer: Self-pay

## 2020-10-22 VITALS — BP 96/56 | HR 64 | Temp 97.3°F | Ht 64.0 in | Wt 122.0 lb

## 2020-10-22 DIAGNOSIS — I779 Disorder of arteries and arterioles, unspecified: Secondary | ICD-10-CM

## 2020-10-22 NOTE — Progress Notes (Signed)
POST OPERATIVE OFFICE NOTE    CC: Ischemic changes right first and second toes  HPI:  This is a 65 y.o. female who is s/p thrombolysis of thrombosed right femoral to popliteal artery bypass PTFE graft.  She developed gangrenous changes to the right first and second toes and returns today for recheck of these.  She continues to have significant pain of these toes, however she does not want surgical intervention due to the fact that she is diabetic.  She is ambulating without assistance.  She was recently evaluated by her primary care provider and reported dark-colored stool concerning for GI bleed.  A CBC was obtained that revealed a hemoglobin of 10 mg/dL.  The patient has Hemoccult cards but has not yet completed these.  She currently denies hematochezia or hematemesis.  She states she is also due for repeat blood work and needs to make her follow-up appointment with her PCP.  Allergies  Allergen Reactions  . Dapagliflozin     Other reaction(s): Other Syncope    Current Outpatient Medications  Medication Sig Dispense Refill  . acetaminophen-codeine (TYLENOL #3) 300-30 MG tablet Take 1-2 tablets by mouth every 6 (six) hours as needed for moderate pain. (Patient not taking: Reported on 09/30/2020) 15 tablet 0  . albuterol (PROVENTIL HFA;VENTOLIN HFA) 108 (90 BASE) MCG/ACT inhaler Inhale 2 puffs into the lungs every 6 (six) hours as needed for wheezing or shortness of breath.    . ALPRAZolam (XANAX) 1 MG tablet Take 1 mg by mouth 3 (three) times daily as needed for anxiety.    Marland Kitchen amLODipine (NORVASC) 2.5 MG tablet Take 1 tablet (2.5 mg total) by mouth daily. 90 tablet 3  . aspirin EC 81 MG tablet Take 81 mg by mouth at bedtime.    Marland Kitchen atorvastatin (LIPITOR) 80 MG tablet Take 80 mg by mouth daily.     . calcitRIOL (ROCALTROL) 0.25 MCG capsule Take 0.25 mcg by mouth every Monday, Wednesday, and Friday.   6  . carvedilol (COREG) 12.5 MG tablet TAKE 1 TABLET BY MOUTH TWICE A DAY (Patient taking  differently: Take 12.5 mg by mouth 2 (two) times daily with a meal.) 180 tablet 0  . cetirizine (ZYRTEC) 10 MG tablet Take 10 mg by mouth daily.  6  . clopidogrel (PLAVIX) 75 MG tablet Take 1 tablet (75 mg total) by mouth daily with breakfast. (Patient not taking: Reported on 10/22/2020) 30 tablet 2  . famotidine (PEPCID) 40 MG tablet TAKE 1 TABLET BY MOUTH EVERY DAY (Patient taking differently: Take 40 mg by mouth daily.) 90 tablet 1  . fenofibrate micronized (LOFIBRA) 200 MG capsule Take 200 mg by mouth daily.    . ferrous sulfate 325 (65 FE) MG tablet Take 325 mg by mouth 3 (three) times daily.  3  . furosemide (LASIX) 40 MG tablet Take 40 mg by mouth daily.     Marland Kitchen HYDROcodone-acetaminophen (NORCO/VICODIN) 5-325 MG tablet Take 1 tablet by mouth every 8 (eight) hours as needed. (Patient not taking: Reported on 09/30/2020) 15 tablet 0  . Insulin Glargine (BASAGLAR KWIKPEN) 100 UNIT/ML SOPN Inject 20-60 Units into the skin 2 (two) times daily before a meal. Per sliding scale  3  . Levothyroxine Sodium 112 MCG CAPS Take 112 mcg by mouth daily before breakfast.    . Omega-3 Fatty Acids (FISH OIL) 1000 MG CAPS Take 1,000 mg by mouth 2 (two) times daily.    Marland Kitchen PARoxetine (PAXIL) 40 MG tablet Take 60 mg by mouth at bedtime.     Marland Kitchen  promethazine (PHENERGAN) 25 MG tablet Take 25 mg by mouth daily as needed for nausea or vomiting.   6  . tiZANidine (ZANAFLEX) 4 MG tablet Take 1 tablet (4 mg total) by mouth every 6 (six) hours as needed for muscle spasms. 30 tablet 0  . Vitamin D, Ergocalciferol, (DRISDOL) 50000 UNITS CAPS capsule Take 50,000 Units by mouth 2 (two) times a week. Takes on Mondays and Fridays  1   No current facility-administered medications for this visit.     ROS:  See HPI  BP (!) 96/56 (BP Location: Left Arm)   Pulse 64   Temp (!) 97.3 F (36.3 C) (Skin)   Ht 5\' 4"  (1.626 m)   Wt 122 lb (55.3 kg)   SpO2 100%   BMI 20.94 kg/m   Physical Exam:  General appearance: Thin,  well-developed well-nourished female no apparent distress Cardiac: Heart rate and rhythm are regular Respiratory: non-labored Extremities: Dry gangrene to the distal right first and second toes.  There are no signs of cellulitis.  1+ palpable bilateral DP pulses.  Motor function and sensation intact       Assessment/Plan:  This is a 65 y.o. female who is s/p: Thrombolysis of her right lower extremity femoral to below-knee bypass with PTFE graft in February.  Right lower extremity well-perfused.  Dry gangrene of the right great toe and distal right second toe.  She clearly states she does not desire amputation.  I explained we do not treat chronic pain due to gangrenous toes with narcotics.  If her pain is not tolerable we recommend amputation.  I discussed the case with Dr. Oneida Alar in regards to her Plavix discontinuation.  We would recommend that she continue Plavix until it is determined that she has clear evidence of acute blood loss.  She has marginally low blood pressure and history of thrombosed right lower extremity PTFE bypass recently.  In this regard, she would be at elevated risk for thrombosis without her Plavix.  I strongly encouraged her to discuss this with her primary care provider, complete her Hemoccult testing and follow-up in a timely manner to discuss etiology of her dark stools.  She will follow up in 3 months with duplex of the right lower extremity.  I offered to see her sooner to examine her toes, but she assures me she will call if she sees changes consistent with cellulitis, i.e. draining, erythema, fever, chills or decides regarding amputation.  Risa Grill, PA-C [facility] [phone]  Clinic MD: Oneida Alar

## 2020-10-27 ENCOUNTER — Other Ambulatory Visit: Payer: Self-pay

## 2020-10-27 DIAGNOSIS — I739 Peripheral vascular disease, unspecified: Secondary | ICD-10-CM

## 2020-11-09 ENCOUNTER — Telehealth: Payer: Self-pay | Admitting: *Deleted

## 2020-11-09 NOTE — Telephone Encounter (Signed)
Caryl Pina, nurse at [facility] called stating patient was positive for blood in stool and asked discontinuing Plavix was an option. I explained Dr Oneida Alar last office visit note and the importance of her Plavix.  Caryl Pina is having Rico Junker  PA call Dr Oneida Alar Wednesday morning, 11/11/2020.

## 2020-11-27 ENCOUNTER — Telehealth: Payer: Self-pay | Admitting: Internal Medicine

## 2020-11-27 NOTE — Telephone Encounter (Signed)
Hi Linda, we have received an urgent referral for this patient for positive FOB, hx GI bleed. Referral states that hemoglobin and hematocrit have been gradually dropping and pt does not feel well. She has seen Dr. Henrene Pastor before. There is nothing available anytime soon. Could you please help [place] with this referral? Thank you.

## 2020-11-27 NOTE — Telephone Encounter (Signed)
Scheduled patient for first available office visit with Nicoletta Ba PA on 12/18/20. Also put patient on wait list. Called patient with appt. And asked her if she has frank blood in her stool or becomes SOB, dizzy or light-headed to go to the ED

## 2020-11-28 ENCOUNTER — Other Ambulatory Visit: Payer: Self-pay

## 2020-11-28 ENCOUNTER — Ambulatory Visit
Admission: RE | Admit: 2020-11-28 | Discharge: 2020-11-28 | Disposition: A | Discharge status: Home / Self Care | Payer: Medicare Other | Source: Ambulatory Visit | Attending: Physician Assistant | Admitting: Physician Assistant

## 2020-11-28 DIAGNOSIS — Z1231 Encounter for screening mammogram for malignant neoplasm of breast: Secondary | ICD-10-CM

## 2020-11-30 ENCOUNTER — Other Ambulatory Visit: Payer: Self-pay | Admitting: Physician Assistant

## 2020-11-30 DIAGNOSIS — Z1231 Encounter for screening mammogram for malignant neoplasm of breast: Secondary | ICD-10-CM

## 2020-12-03 ENCOUNTER — Ambulatory Visit: Payer: Medicare Other

## 2020-12-10 ENCOUNTER — Other Ambulatory Visit: Payer: Self-pay

## 2020-12-10 ENCOUNTER — Emergency Department (HOSPITAL_COMMUNITY): Payer: Medicare Other

## 2020-12-10 ENCOUNTER — Inpatient Hospital Stay (HOSPITAL_COMMUNITY)
Admission: EM | Admit: 2020-12-10 | Discharge: 2020-12-12 | DRG: 280 | Disposition: A | Discharge status: Home / Self Care | Payer: Medicare Other | Attending: Internal Medicine | Admitting: Internal Medicine

## 2020-12-10 ENCOUNTER — Encounter (HOSPITAL_COMMUNITY): Payer: Self-pay

## 2020-12-10 DIAGNOSIS — R739 Hyperglycemia, unspecified: Secondary | ICD-10-CM

## 2020-12-10 DIAGNOSIS — E1140 Type 2 diabetes mellitus with diabetic neuropathy, unspecified: Secondary | ICD-10-CM | POA: Diagnosis present

## 2020-12-10 DIAGNOSIS — Z7982 Long term (current) use of aspirin: Secondary | ICD-10-CM

## 2020-12-10 DIAGNOSIS — E871 Hypo-osmolality and hyponatremia: Secondary | ICD-10-CM | POA: Diagnosis present

## 2020-12-10 DIAGNOSIS — F32A Depression, unspecified: Secondary | ICD-10-CM | POA: Diagnosis present

## 2020-12-10 DIAGNOSIS — Z9049 Acquired absence of other specified parts of digestive tract: Secondary | ICD-10-CM

## 2020-12-10 DIAGNOSIS — Z6820 Body mass index (BMI) 20.0-20.9, adult: Secondary | ICD-10-CM

## 2020-12-10 DIAGNOSIS — E876 Hypokalemia: Secondary | ICD-10-CM | POA: Diagnosis present

## 2020-12-10 DIAGNOSIS — Z888 Allergy status to other drugs, medicaments and biological substances status: Secondary | ICD-10-CM

## 2020-12-10 DIAGNOSIS — I2489 Other forms of acute ischemic heart disease: Secondary | ICD-10-CM

## 2020-12-10 DIAGNOSIS — K219 Gastro-esophageal reflux disease without esophagitis: Secondary | ICD-10-CM | POA: Diagnosis present

## 2020-12-10 DIAGNOSIS — E1152 Type 2 diabetes mellitus with diabetic peripheral angiopathy with gangrene: Secondary | ICD-10-CM | POA: Diagnosis present

## 2020-12-10 DIAGNOSIS — I160 Hypertensive urgency: Secondary | ICD-10-CM

## 2020-12-10 DIAGNOSIS — I248 Other forms of acute ischemic heart disease: Secondary | ICD-10-CM

## 2020-12-10 DIAGNOSIS — F419 Anxiety disorder, unspecified: Secondary | ICD-10-CM | POA: Diagnosis present

## 2020-12-10 DIAGNOSIS — I130 Hypertensive heart and chronic kidney disease with heart failure and stage 1 through stage 4 chronic kidney disease, or unspecified chronic kidney disease: Secondary | ICD-10-CM | POA: Diagnosis present

## 2020-12-10 DIAGNOSIS — I10 Essential (primary) hypertension: Secondary | ICD-10-CM | POA: Diagnosis not present

## 2020-12-10 DIAGNOSIS — D72829 Elevated white blood cell count, unspecified: Secondary | ICD-10-CM

## 2020-12-10 DIAGNOSIS — Z87891 Personal history of nicotine dependence: Secondary | ICD-10-CM

## 2020-12-10 DIAGNOSIS — Z7902 Long term (current) use of antithrombotics/antiplatelets: Secondary | ICD-10-CM

## 2020-12-10 DIAGNOSIS — Z86010 Personal history of colonic polyps: Secondary | ICD-10-CM

## 2020-12-10 DIAGNOSIS — Z20822 Contact with and (suspected) exposure to covid-19: Secondary | ICD-10-CM | POA: Diagnosis present

## 2020-12-10 DIAGNOSIS — J45909 Unspecified asthma, uncomplicated: Secondary | ICD-10-CM | POA: Diagnosis present

## 2020-12-10 DIAGNOSIS — N179 Acute kidney failure, unspecified: Secondary | ICD-10-CM | POA: Diagnosis present

## 2020-12-10 DIAGNOSIS — I2510 Atherosclerotic heart disease of native coronary artery without angina pectoris: Secondary | ICD-10-CM | POA: Diagnosis present

## 2020-12-10 DIAGNOSIS — I739 Peripheral vascular disease, unspecified: Secondary | ICD-10-CM

## 2020-12-10 DIAGNOSIS — E8729 Other acidosis: Secondary | ICD-10-CM

## 2020-12-10 DIAGNOSIS — E8809 Other disorders of plasma-protein metabolism, not elsewhere classified: Secondary | ICD-10-CM

## 2020-12-10 DIAGNOSIS — I214 Non-ST elevation (NSTEMI) myocardial infarction: Principal | ICD-10-CM | POA: Diagnosis present

## 2020-12-10 DIAGNOSIS — Z8249 Family history of ischemic heart disease and other diseases of the circulatory system: Secondary | ICD-10-CM

## 2020-12-10 DIAGNOSIS — E1122 Type 2 diabetes mellitus with diabetic chronic kidney disease: Secondary | ICD-10-CM | POA: Diagnosis present

## 2020-12-10 DIAGNOSIS — D649 Anemia, unspecified: Secondary | ICD-10-CM | POA: Diagnosis present

## 2020-12-10 DIAGNOSIS — E860 Dehydration: Secondary | ICD-10-CM

## 2020-12-10 DIAGNOSIS — I6529 Occlusion and stenosis of unspecified carotid artery: Secondary | ICD-10-CM | POA: Diagnosis present

## 2020-12-10 DIAGNOSIS — R55 Syncope and collapse: Secondary | ICD-10-CM

## 2020-12-10 DIAGNOSIS — Z79899 Other long term (current) drug therapy: Secondary | ICD-10-CM

## 2020-12-10 DIAGNOSIS — E039 Hypothyroidism, unspecified: Secondary | ICD-10-CM

## 2020-12-10 DIAGNOSIS — E43 Unspecified severe protein-calorie malnutrition: Secondary | ICD-10-CM | POA: Insufficient documentation

## 2020-12-10 DIAGNOSIS — E1110 Type 2 diabetes mellitus with ketoacidosis without coma: Secondary | ICD-10-CM | POA: Diagnosis present

## 2020-12-10 DIAGNOSIS — Z951 Presence of aortocoronary bypass graft: Secondary | ICD-10-CM

## 2020-12-10 DIAGNOSIS — N189 Chronic kidney disease, unspecified: Secondary | ICD-10-CM

## 2020-12-10 DIAGNOSIS — N184 Chronic kidney disease, stage 4 (severe): Secondary | ICD-10-CM | POA: Diagnosis present

## 2020-12-10 DIAGNOSIS — I161 Hypertensive emergency: Secondary | ICD-10-CM | POA: Diagnosis present

## 2020-12-10 DIAGNOSIS — R32 Unspecified urinary incontinence: Secondary | ICD-10-CM | POA: Diagnosis present

## 2020-12-10 DIAGNOSIS — I96 Gangrene, not elsewhere classified: Secondary | ICD-10-CM

## 2020-12-10 DIAGNOSIS — I5042 Chronic combined systolic (congestive) and diastolic (congestive) heart failure: Secondary | ICD-10-CM

## 2020-12-10 DIAGNOSIS — Z90710 Acquired absence of both cervix and uterus: Secondary | ICD-10-CM

## 2020-12-10 DIAGNOSIS — Z833 Family history of diabetes mellitus: Secondary | ICD-10-CM

## 2020-12-10 DIAGNOSIS — R9431 Abnormal electrocardiogram [ECG] [EKG]: Secondary | ICD-10-CM

## 2020-12-10 DIAGNOSIS — E782 Mixed hyperlipidemia: Secondary | ICD-10-CM

## 2020-12-10 DIAGNOSIS — E872 Acidosis: Secondary | ICD-10-CM

## 2020-12-10 DIAGNOSIS — E785 Hyperlipidemia, unspecified: Secondary | ICD-10-CM | POA: Diagnosis present

## 2020-12-10 DIAGNOSIS — R112 Nausea with vomiting, unspecified: Secondary | ICD-10-CM | POA: Diagnosis not present

## 2020-12-10 DIAGNOSIS — Z794 Long term (current) use of insulin: Secondary | ICD-10-CM

## 2020-12-10 DIAGNOSIS — I25110 Atherosclerotic heart disease of native coronary artery with unstable angina pectoris: Secondary | ICD-10-CM | POA: Diagnosis present

## 2020-12-10 DIAGNOSIS — Z79890 Hormone replacement therapy (postmenopausal): Secondary | ICD-10-CM

## 2020-12-10 LAB — CBC WITH DIFFERENTIAL/PLATELET
Abs Immature Granulocytes: 0.19 10*3/uL — ABNORMAL HIGH (ref 0.00–0.07)
Basophils Absolute: 0.1 10*3/uL (ref 0.0–0.1)
Basophils Relative: 0 %
Eosinophils Absolute: 1.6 10*3/uL — ABNORMAL HIGH (ref 0.0–0.5)
Eosinophils Relative: 6 %
HCT: 37.5 % (ref 36.0–46.0)
Hemoglobin: 11.7 g/dL — ABNORMAL LOW (ref 12.0–15.0)
Immature Granulocytes: 1 %
Lymphocytes Relative: 4 %
Lymphs Abs: 1 10*3/uL (ref 0.7–4.0)
MCH: 29.3 pg (ref 26.0–34.0)
MCHC: 31.2 g/dL (ref 30.0–36.0)
MCV: 93.8 fL (ref 80.0–100.0)
Monocytes Absolute: 1.4 10*3/uL — ABNORMAL HIGH (ref 0.1–1.0)
Monocytes Relative: 5 %
Neutro Abs: 23.4 10*3/uL — ABNORMAL HIGH (ref 1.7–7.7)
Neutrophils Relative %: 84 %
Platelets: 364 10*3/uL (ref 150–400)
RBC: 4 MIL/uL (ref 3.87–5.11)
RDW: 13.6 % (ref 11.5–15.5)
WBC: 27.6 10*3/uL — ABNORMAL HIGH (ref 4.0–10.5)
nRBC: 0 % (ref 0.0–0.2)

## 2020-12-10 LAB — COMPREHENSIVE METABOLIC PANEL
ALT: 13 U/L (ref 0–44)
AST: 18 U/L (ref 15–41)
Albumin: 3.3 g/dL — ABNORMAL LOW (ref 3.5–5.0)
Alkaline Phosphatase: 60 U/L (ref 38–126)
Anion gap: 23 — ABNORMAL HIGH (ref 5–15)
BUN: 43 mg/dL — ABNORMAL HIGH (ref 8–23)
CO2: 13 mmol/L — ABNORMAL LOW (ref 22–32)
Calcium: 7.9 mg/dL — ABNORMAL LOW (ref 8.9–10.3)
Chloride: 87 mmol/L — ABNORMAL LOW (ref 98–111)
Creatinine, Ser: 1.94 mg/dL — ABNORMAL HIGH (ref 0.44–1.00)
GFR, Estimated: 28 mL/min — ABNORMAL LOW (ref >60)
Glucose, Bld: 617 mg/dL (ref 70–99)
Potassium: 4.6 mmol/L (ref 3.5–5.1)
Sodium: 123 mmol/L — ABNORMAL LOW (ref 135–145)
Total Bilirubin: 2.1 mg/dL — ABNORMAL HIGH (ref 0.3–1.2)
Total Protein: 6.8 g/dL (ref 6.5–8.1)

## 2020-12-10 LAB — URINALYSIS, ROUTINE W REFLEX MICROSCOPIC
Bilirubin Urine: NEGATIVE
Glucose, UA: >=500 mg/dL — AB
Ketones, ur: 20 mg/dL — AB
Leukocytes,Ua: NEGATIVE
Nitrite: NEGATIVE
Protein, ur: 100 mg/dL — AB
Specific Gravity, Urine: 1.018 (ref 1.005–1.030)
pH: 5 (ref 5.0–8.0)

## 2020-12-10 LAB — BLOOD GAS, VENOUS
Acid-base deficit: 11.8 mmol/L — ABNORMAL HIGH (ref 0.0–2.0)
Bicarbonate: 14.4 mmol/L — ABNORMAL LOW (ref 20.0–28.0)
FIO2: 100
O2 Saturation: 33 %
Patient temperature: 36.7
pCO2, Ven: 28.7 mmHg — ABNORMAL LOW (ref 44.0–60.0)
pH, Ven: 7.292 (ref 7.250–7.430)
pO2, Ven: <31 mmHg — CL (ref 32.0–45.0)

## 2020-12-10 LAB — CBG MONITORING, ED
Glucose-Capillary: >600 mg/dL (ref 70–99)
Glucose-Capillary: 502 mg/dL (ref 70–99)
Glucose-Capillary: 430 mg/dL — ABNORMAL HIGH (ref 70–99)
Glucose-Capillary: 432 mg/dL — ABNORMAL HIGH (ref 70–99)
Glucose-Capillary: 350 mg/dL — ABNORMAL HIGH (ref 70–99)

## 2020-12-10 LAB — RESP PANEL BY RT-PCR (FLU A&B, COVID) ARPGX2
Influenza A by PCR: NEGATIVE
Influenza B by PCR: NEGATIVE
SARS Coronavirus 2 by RT PCR: NEGATIVE

## 2020-12-10 LAB — BETA-HYDROXYBUTYRIC ACID: Beta-Hydroxybutyric Acid: >8 mmol/L — ABNORMAL HIGH (ref 0.05–0.27)

## 2020-12-10 MED ORDER — HYDRALAZINE HCL 20 MG/ML IJ SOLN
10.0000 mg | Freq: Once | INTRAMUSCULAR | Status: AC
  Administered 2020-12-10: 10 mg via INTRAVENOUS
  Filled 2020-12-10: qty 1

## 2020-12-10 MED ORDER — INSULIN REGULAR(HUMAN) IN NACL 100-0.9 UT/100ML-% IV SOLN
INTRAVENOUS | Status: DC
  Administered 2020-12-10: 7 [IU]/h via INTRAVENOUS
  Filled 2020-12-10: qty 100

## 2020-12-10 MED ORDER — LACTATED RINGERS IV BOLUS
20.0000 mL/kg | Freq: Once | INTRAVENOUS | Status: AC
  Administered 2020-12-10: 1088 mL via INTRAVENOUS

## 2020-12-10 MED ORDER — DEXTROSE 50 % IV SOLN: 0.0000 mL | INTRAVENOUS | Status: DC | PRN

## 2020-12-10 MED ORDER — LACTATED RINGERS IV SOLN: INTRAVENOUS | Status: DC

## 2020-12-10 MED ORDER — ONDANSETRON HCL 4 MG/2ML IJ SOLN
4.0000 mg | Freq: Once | INTRAMUSCULAR | Status: AC
  Administered 2020-12-10: 4 mg via INTRAVENOUS
  Filled 2020-12-10: qty 2

## 2020-12-10 MED ORDER — DEXTROSE IN LACTATED RINGERS 5 % IV SOLN: INTRAVENOUS | Status: DC

## 2020-12-10 MED ORDER — HEPARIN SODIUM (PORCINE) 5000 UNIT/ML IJ SOLN
5000.0000 [IU] | Freq: Three times a day (TID) | INTRAMUSCULAR | Status: DC
  Administered 2020-12-11: 5000 [IU] via SUBCUTANEOUS
  Filled 2020-12-10: qty 1

## 2020-12-10 MED ORDER — HYDRALAZINE HCL 20 MG/ML IJ SOLN
5.0000 mg | Freq: Once | INTRAMUSCULAR | Status: AC
  Administered 2020-12-10: 5 mg via INTRAVENOUS
  Filled 2020-12-10: qty 1

## 2020-12-10 MED ORDER — SODIUM CHLORIDE 0.9 % IV BOLUS
1000.0000 mL | Freq: Once | INTRAVENOUS | Status: AC
  Administered 2020-12-10: 1000 mL via INTRAVENOUS

## 2020-12-10 MED ORDER — POTASSIUM CHLORIDE 10 MEQ/100ML IV SOLN
10.0000 meq | INTRAVENOUS | Status: AC
  Filled 2020-12-10: qty 100

## 2020-12-10 MED ORDER — LABETALOL HCL 5 MG/ML IV SOLN
5.0000 mg | Freq: Four times a day (QID) | INTRAVENOUS | Status: DC | PRN
  Administered 2020-12-11 (×2): 5 mg via INTRAVENOUS
  Filled 2020-12-10 (×2): qty 4

## 2020-12-10 NOTE — ED Triage Notes (Signed)
Pt reports blood sugar elevated x 2 or 3 days.  EMS reports cbg read "HI" pta.  EMS started IV and gave 555ml nss.  Also reports was taken off of her bp medication Tuesday and her bp has been elevated as well.

## 2020-12-10 NOTE — ED Provider Notes (Signed)
[facility] EMERGENCY DEPARTMENT Provider Note   CSN: [id number] Arrival date & time: 12/10/20  1633     History Chief Complaint  Patient presents with  . Hyperglycemia    [name] is a 65 y.o. female.  Patient complains of vomiting and weakness.  She also passed out and hit her head.  Patient has a history of diabetes.  The history is provided by the patient and medical records. No language interpreter was used.  Weakness Severity:  Moderate Onset quality:  Sudden Timing:  Constant Progression:  Worsening Chronicity:  New Context: not alcohol use   Relieved by:  Nothing Worsened by:  Nothing Ineffective treatments:  None tried Associated symptoms: nausea   Associated symptoms: no abdominal pain, no chest pain, no cough, no diarrhea, no frequency, no headaches and no seizures        Past Medical History:  Diagnosis Date  . Adenomatous polyp of colon   . Anemia   . Arthritis   . Asthma   . Carotid artery disease ([place])   . Carpal tunnel syndrome   . Chronic renal insufficiency    Stage 3 Kidney disease  . Coronary artery disease   . Depression with anxiety   . Diabetes mellitus    type II  . Diverticulosis of colon (without mention of hemorrhage)   . GERD (gastroesophageal reflux disease)   . Heart murmur   . History of hiatal hernia   . Hyperlipidemia   . Hypertension   . Hypothyroidism   . PVD (peripheral vascular disease) [facility])     Patient Active Problem List   Diagnosis Date Noted  . Critical lower limb ischemia ([place]) 08/27/2020  . Abnormal CT scan, esophagus   . Hematochezia   . GIB (gastrointestinal bleeding) 08/01/2017  . Pressure injury of skin 11/25/2016  . PAD (peripheral artery disease) ([place]) 11/23/2016  . Major depressive disorder, recurrent episode, moderate ([place]) 04/04/2016  . Disorder resulting from impaired renal function 10/27/2009  . Bilateral carotid artery stenosis 10/08/2009  . Essential hypertension 10/02/2008  . GERD  03/11/2008  . COLONIC POLYPS 03/10/2008  . DM2 (diabetes mellitus, type 2) ([place]) 03/10/2008  . Hyperlipidemia 03/10/2008  . ANXIETY DEPRESSION 03/10/2008  . Coronary atherosclerosis 03/10/2008  . Peripheral vascular disease ([place]) 03/10/2008  . Diverticulosis of colon 03/10/2008    Past Surgical History:  Procedure Laterality Date  . ABDOMINAL AORTAGRAM N/A 12/18/2013   Procedure: ABDOMINAL [name];  Surgeon: Wellington Hampshire, MD;  Location: [place] CATH LAB;  Service: Cardiovascular;  Laterality: N/A;  . ABDOMINAL AORTOGRAM W/LOWER EXTREMITY N/A 11/16/2016   Procedure: Abdominal Aortogram w/Lower Extremity;  Surgeon: Wellington Hampshire, MD;  Location: [place] CV LAB;  Service: Cardiovascular;  Laterality: N/A;  . ABDOMINAL AORTOGRAM W/LOWER EXTREMITY N/A 01/25/2017   Procedure: Abdominal Aortogram w/Lower Extremity;  Surgeon: Wellington Hampshire, MD;  Location: [place] CV LAB;  Service: Cardiovascular;  Laterality: N/A;  only completed Lower Extremity  . ABDOMINAL AORTOGRAM W/LOWER EXTREMITY N/A 02/28/2018   Procedure: ABDOMINAL AORTOGRAM W/LOWER EXTREMITY;  Surgeon: Wellington Hampshire, MD;  Location: [place] CV LAB;  Service: Cardiovascular;  Laterality: N/A;  . ABDOMINAL AORTOGRAM W/LOWER EXTREMITY N/A 08/27/2020   Procedure: ABDOMINAL AORTOGRAM W/LOWER EXTREMITY;  Surgeon: Marty Heck, MD;  Location: [place] CV LAB;  Service: Cardiovascular;  Laterality: N/A;  . ABDOMINAL HYSTERECTOMY    . APPENDECTOMY    . APPLICATION OF WOUND VAC Right 02/13/2017   Procedure: APPLICATION OF INCISIONAL WOUND VAC  RIGHT GROIN;  Surgeon: Elam Dutch, MD;  Location: [place];  Service: Vascular;  Laterality: Right;  . CHOLECYSTECTOMY    . CORONARY ARTERY BYPASS GRAFT  2004   x4  . ENDARTERECTOMY FEMORAL Left 11/23/2016   Procedure: ENDARTERECTOMY OF LEFT EXTERNAL COMMON FEMORAL ARTERY WITH EXTENDED LEFT PROFUNDOPLASTY;  Surgeon: Elam Dutch, MD;  Location: [place];  Service: Vascular;   Laterality: Left;  . ENDARTERECTOMY FEMORAL Right 02/13/2017   Procedure: ENDARTERECTOMY RIGHT FEMORAL ARTERY WITH PROFUNDAPLASTY;  Surgeon: Elam Dutch, MD;  Location: [facility] OR;  Service: Vascular;  Laterality: Right;  . ESOPHAGOGASTRODUODENOSCOPY N/A 08/02/2017   Procedure: ESOPHAGOGASTRODUODENOSCOPY (EGD);  Surgeon: Ladene Artist, MD;  Location: [facility] ENDOSCOPY;  Service: Endoscopy;  Laterality: N/A;  . FEMORAL-POPLITEAL BYPASS GRAFT Left 11/23/2016   Procedure: LEFT FEMORAL-BELOW KNEE POPLITEAL ARTERY BYPASS;  Surgeon: Elam Dutch, MD;  Location: [place];  Service: Vascular;  Laterality: Left;  . FEMORAL-POPLITEAL BYPASS GRAFT Right 02/13/2017   Procedure: BYPASS GRAFT RIGHT FEMORAL-BELOW KNEE POPLITEAL ARTERY;  Surgeon: Elam Dutch, MD;  Location: [place];  Service: Vascular;  Laterality: Right;  . KNEE SURGERY Right 2013   arthroscopy  . PATCH ANGIOPLASTY Right 02/13/2017   Procedure: VEIN PATCH ANGIOPLASTY RIGHT POPLITEAL ARTERY AND HEMASHIELD PATCH ANGIOPLASTY OF RIGHT FEMORAL ARTERY;  Surgeon: Elam Dutch, MD;  Location: [place];  Service: Vascular;  Laterality: Right;  . PERIPHERAL VASCULAR BALLOON ANGIOPLASTY  02/28/2018   Procedure: PERIPHERAL VASCULAR BALLOON ANGIOPLASTY;  Surgeon: Wellington Hampshire, MD;  Location: [place] CV LAB;  Service: Cardiovascular;;  Left Fem-pop Bypass  . PERIPHERAL VASCULAR INTERVENTION Right 08/28/2020   Procedure: PERIPHERAL VASCULAR INTERVENTION;  Surgeon: Elam Dutch, MD;  Location: [place] CV LAB;  Service: Cardiovascular;  Laterality: Right;  . PERIPHERAL VASCULAR THROMBECTOMY Right 08/28/2020   Procedure: LYSIS RECHECK;  Surgeon: Elam Dutch, MD;  Location: [place] CV LAB;  Service: Cardiovascular;  Laterality: Right;     OB History   No obstetric history on file.     Family History  Problem Relation Age of Onset  . Diabetes Mother   . Breast cancer Mother   . Heart disease Father   . Colon polyps Daughter   .  Breast cancer Daughter   . Colon cancer Neg Hx   . Esophageal cancer Neg Hx   . Stomach cancer Neg Hx   . Rectal cancer Neg Hx     Social History   Tobacco Use  . Smoking status: Former Smoker    Packs/day: 1.00    Years: 30.00    Pack years: 30.00    Types: Cigarettes    Quit date: 2018    Years since quitting: 4.4  . Smokeless tobacco: Never Used  Vaping Use  . Vaping Use: Never used  Substance Use Topics  . Alcohol use: No    Alcohol/week: 0.0 standard drinks  . Drug use: No    Home Medications Prior to Admission medications   Medication Sig Start Date End Date Taking? Authorizing Provider  acetaminophen-codeine (TYLENOL #3) 300-30 MG tablet Take 1-2 tablets by mouth every 6 (six) hours as needed for moderate pain. Patient not taking: Reported on 09/30/2020 08/14/20   Faustino Congress, NP  albuterol (PROVENTIL HFA;VENTOLIN HFA) 108 (90 BASE) MCG/ACT inhaler Inhale 2 puffs into the lungs every 6 (six) hours as needed for wheezing or shortness of breath.    [provider]  ALPRAZolam Duanne Moron) 1 MG tablet Take 1 mg  by mouth 3 (three) times daily as needed for anxiety. 05/11/11   [provider]  amLODipine (NORVASC) 2.5 MG tablet Take 1 tablet (2.5 mg total) by mouth daily. 09/08/20   Wellington Hampshire, MD  aspirin EC 81 MG tablet Take 81 mg by mouth at bedtime.    [provider]  atorvastatin (LIPITOR) 80 MG tablet Take 80 mg by mouth daily.     [provider]  calcitRIOL (ROCALTROL) 0.25 MCG capsule Take 0.25 mcg by mouth every Monday, Wednesday, and Friday.  07/02/17   [provider]  carvedilol (COREG) 12.5 MG tablet TAKE 1 TABLET BY MOUTH TWICE A DAY Patient taking differently: Take 12.5 mg by mouth 2 (two) times daily with a meal. 06/15/20   Wellington Hampshire, MD  cetirizine (ZYRTEC) 10 MG tablet Take 10 mg by mouth daily. 06/30/17   [provider]  clopidogrel (PLAVIX) 75 MG tablet Take 1 tablet (75 mg total) by  mouth daily with breakfast. Patient not taking: Reported on 10/22/2020 08/29/20   Baglia, Corrina, PA-C  famotidine (PEPCID) 40 MG tablet TAKE 1 TABLET BY MOUTH EVERY DAY Patient taking differently: Take 40 mg by mouth daily. 05/01/20   Wellington Hampshire, MD  fenofibrate micronized (LOFIBRA) 200 MG capsule Take 200 mg by mouth daily.    [provider]  ferrous sulfate 325 (65 FE) MG tablet Take 325 mg by mouth 3 (three) times daily. 05/10/15   [provider]  furosemide (LASIX) 40 MG tablet Take 40 mg by mouth daily.  05/08/11   [provider]  HYDROcodone-acetaminophen (NORCO/VICODIN) 5-325 MG tablet Take 1 tablet by mouth every 8 (eight) hours as needed. Patient not taking: Reported on 09/30/2020 09/08/20   Wellington Hampshire, MD  Insulin Glargine [facility]) 100 UNIT/ML SOPN Inject 20-60 Units into the skin 2 (two) times daily before a meal. Per sliding scale 01/26/17   [provider]  Omega-3 Fatty Acids (FISH OIL) 1000 MG CAPS Take 1,000 mg by mouth 2 (two) times daily.    [provider]  PARoxetine (PAXIL) 40 MG tablet Take 60 mg by mouth at bedtime.     [provider]  promethazine (PHENERGAN) 25 MG tablet Take 25 mg by mouth daily as needed for nausea or vomiting.  08/06/16   [provider]  tiZANidine (ZANAFLEX) 4 MG tablet Take 1 tablet (4 mg total) by mouth every 6 (six) hours as needed for muscle spasms. 08/14/20   Faustino Congress, NP  Vitamin D, Ergocalciferol, (DRISDOL) 50000 UNITS CAPS capsule Take 50,000 Units by mouth 2 (two) times a week. Takes on Mondays and Fridays    [provider]    Allergies    Dapagliflozin  Review of Systems   Review of Systems  Constitutional: Negative for appetite change and fatigue.  HENT: Negative for congestion, ear discharge and sinus pressure.   Eyes: Negative for discharge.  Respiratory: Negative for cough.   Cardiovascular: Negative for chest pain.   Gastrointestinal: Positive for nausea. Negative for abdominal pain and diarrhea.  Genitourinary: Negative for frequency and hematuria.  Musculoskeletal: Negative for back pain.  Skin: Negative for rash.  Neurological: Positive for weakness. Negative for seizures and headaches.  Psychiatric/Behavioral: Negative for hallucinations.    Physical Exam Updated Vital Signs BP (!) 207/91   Pulse (!) 102   Temp 98 F (36.7 C) (Oral)   Resp (!) 28   Ht 5\' 4"  (1.626 m)   Wt 54.4 kg  SpO2 100%   BMI 20.60 kg/m   Physical Exam Vitals and nursing note reviewed.  Constitutional:      Appearance: She is well-developed.  HENT:     Head: Normocephalic.     Nose: Nose normal.  Eyes:     General: No scleral icterus.    Conjunctiva/sclera: Conjunctivae normal.  Neck:     Thyroid: No thyromegaly.  Cardiovascular:     Rate and Rhythm: Normal rate and regular rhythm.     Heart sounds: No murmur heard. No friction rub. No gallop.   Pulmonary:     Breath sounds: No stridor. No wheezing or rales.  Chest:     Chest wall: No tenderness.  Abdominal:     General: There is no distension.     Tenderness: There is no abdominal tenderness. There is no rebound.  Musculoskeletal:        General: Normal range of motion.     Cervical back: Neck supple.  Lymphadenopathy:     Cervical: No cervical adenopathy.  Skin:    Findings: No erythema or rash.  Neurological:     Mental Status: She is alert and oriented to person, place, and time.     Motor: No abnormal muscle tone.     Coordination: Coordination normal.  Psychiatric:        Behavior: Behavior normal.     ED Results / Procedures / Treatments   Labs (all labs ordered are listed, but only abnormal results are displayed) Labs Reviewed  CBC WITH DIFFERENTIAL/PLATELET - Abnormal; Notable for the following components:      Result Value   WBC 27.6 (*)    Hemoglobin 11.7 (*)    Neutro Abs 23.4 (*)    Monocytes Absolute 1.4 (*)     Eosinophils Absolute 1.6 (*)    Abs Immature Granulocytes 0.19 (*)    All other components within normal limits  COMPREHENSIVE METABOLIC PANEL - Abnormal; Notable for the following components:   Sodium 123 (*)    Chloride 87 (*)    CO2 13 (*)    Glucose, Bld 617 (*)    BUN 43 (*)    Creatinine, Ser 1.94 (*)    Calcium 7.9 (*)    Albumin 3.3 (*)    Total Bilirubin 2.1 (*)    GFR, Estimated 28 (*)    Anion gap 23 (*)    All other components within normal limits  CBG MONITORING, ED - Abnormal; Notable for the following components:   Glucose-Capillary >600 (*)    All other components within normal limits  RESP PANEL BY RT-PCR (FLU A&B, COVID) ARPGX2  BETA-HYDROXYBUTYRIC ACID  BETA-HYDROXYBUTYRIC ACID  URINALYSIS, ROUTINE W REFLEX MICROSCOPIC  BLOOD GAS, VENOUS  CBG MONITORING, ED    EKG None  Radiology CT Head Wo Contrast  Result Date: 12/10/2020 CLINICAL DATA:  Syncope EXAM: CT HEAD WITHOUT CONTRAST TECHNIQUE: Contiguous axial images were obtained from the base of the skull through the vertex without intravenous contrast. COMPARISON:  03/07/2018 FINDINGS: Brain: No acute intracranial abnormality. Specifically, no hemorrhage, hydrocephalus, mass lesion, acute infarction, or significant intracranial injury. Vascular: No hyperdense vessel or unexpected calcification. Skull: No acute calvarial abnormality. Sinuses/Orbits: No acute findings Other: None IMPRESSION: No acute intracranial abnormality. Electronically Signed   By: Rolm Baptise M.D.   On: 12/10/2020 18:30   DG Chest Port 1 View  Result Date: 12/10/2020 CLINICAL DATA:  Cough EXAM: PORTABLE CHEST 1 VIEW COMPARISON:  08/01/2017 FINDINGS: Cardiac shadow is within normal  limits. Aortic calcifications are seen. Postsurgical changes are again noted. The lungs are clear. No bony abnormality is seen. IMPRESSION: No active disease. Electronically Signed   By: Inez Catalina M.D.   On: 12/10/2020 20:03    Procedures Procedures    Medications Ordered in ED Medications  insulin regular, human (MYXREDLIN) 100 units/ 100 mL infusion (has no administration in time range)  lactated ringers infusion (has no administration in time range)  dextrose 5 % in lactated ringers infusion (0 mLs Intravenous Hold 12/10/20 1954)  dextrose 50 % solution 0-50 mL (has no administration in time range)  potassium chloride 10 mEq in 100 mL IVPB (has no administration in time range)  hydrALAZINE (APRESOLINE) injection 10 mg (has no administration in time range)  sodium chloride 0.9 % bolus 1,000 mL (1,000 mLs Intravenous New Bag/Given 12/10/20 1743)  ondansetron (ZOFRAN) injection 4 mg (4 mg Intravenous Given 12/10/20 1743)  hydrALAZINE (APRESOLINE) injection 5 mg (5 mg Intravenous Given 12/10/20 1745)  sodium chloride 0.9 % bolus 1,000 mL (1,000 mLs Intravenous New Bag/Given 12/10/20 1959)  lactated ringers bolus 1,088 mL (1,088 mLs Intravenous New Bag/Given 12/10/20 1959)    ED Course  I have reviewed the triage vital signs and the nursing notes.  Pertinent labs & imaging results that were available during my care of the patient were reviewed by me and considered in my medical decision making (see chart for details). CRITICAL CARE Performed by: Milton Ferguson Total critical care time: 45 minutes Critical care time was exclusive of separately billable procedures and treating other patients. Critical care was necessary to treat or prevent imminent or life-threatening deterioration. Critical care was time spent personally by me on the following activities: development of treatment plan with patient and/or surrogate as well as nursing, discussions with consultants, evaluation of patient's response to treatment, examination of patient, obtaining history from patient or surrogate, ordering and performing treatments and interventions, ordering and review of laboratory studies, ordering and review of radiographic studies, pulse oximetry and re-evaluation of  patient's condition.    MDM Rules/Calculators/A&P                          Patient in DKA and poorly controlled blood pressure.  She is started on insulin drip and given hydralazine will be admitted to medicine Final Clinical Impression(s) / ED Diagnoses Final diagnoses:  None    Rx / DC Orders ED Discharge Orders    None       Milton Ferguson, MD 12/10/20 2016

## 2020-12-10 NOTE — ED Notes (Signed)
Pt also says passed out today and has abrasion beside r eye.  Reports has been having syncopal episodes for the past 4 months.

## 2020-12-10 NOTE — H&P (Deleted)
History and Physical  [name] KGY:[id number] DOB: 03/20/1956 DOA: 12/10/2020  Referring physician: Milton Ferguson, MD PCP: Nicholes Rough, PA-C  Patient coming from: Home  Chief Complaint: Elevated blood sugar level and high blood pressure  HPI: [name] is a 65 y.o. female with medical history significant for CAD s/p CABG in 2004, type 2 DM with diabetic neuropathy, CKD, moderate carotid stenosis, and PAD s/pleft external iliac/common femoral and profunda endarterectomy with extended profundoplasty, ligation of the left superficial femoral artery and femoral to below the knee popliteal artery bypass with a vein graft in 05/18, and more recently right femoral to below-knee popliteal artery bypass in August 2018 for critical limb ischemia and right thrombolysis and angioplasty of distal anastomosis right femoropopliteal bypass (February 2022) who presents to the emergency department due to several months of several episodes of recurrent syncope which appears to be mainly orthostatic.  Most of the history was obtained from daughter at bedside.  Per daughter, patient was placed on farxiga several months ago and she has been having episodes of syncope without any seizure-like movements and she recently hit her head after she syncopized.  She was taking off [place], but she continued to have episodes of syncope.  Patient also complained of nausea and vomiting which got worse within the past 2 days with difficulty in being able to keep anything down, this was associated with epigastric pain with burning sensation in the chest and throat.  She reported soft BP while having above syncope, she followed up with her kidney doctor a few days ago and he took off all BP meds, but BP became elevated about 2 days ago and it was 190/119 this morning, so EMS was activated and patient was taken to the ED for further evaluation and management.  ED Course:  In the emergency department, she was intermittently  tachypneic, BP was 223/99 and other vital signs were within normal range.  Work-up in the ED shows leukocytosis, BUN/creatinine 43/1.94 (baseline creatinine 1.7-2.0), anion gap 23, hyperglycemia, albumin 3.3, beta hydroxybutyric acid > 8.0.  Influenza A, B and SARS coronavirus 2 was negative. Chest x-ray showed no active disease CT head without contrast showed no acute intracranial abnormality Hydralazine was given due to elevated BP, patient was started on insulin drip per DKA Endo tool, IV hydration was provided.  Hospitalist was asked to admit patient for further evaluation and management.  Review of Systems: Constitutional: Negative for chills and fever.  HENT: Negative for ear pain and sore throat.   Eyes: Negative for pain and visual disturbance.  Respiratory: Negative for cough, chest tightness and shortness of breath.   Cardiovascular: Positive for recurrent episodes of syncope.  Negative for chest pain and palpitations.  Gastrointestinal: Positive for abdominal pain, nausea and vomiting.  Endocrine: Negative for polyphagia and polyuria.  Genitourinary: Negative for decreased urine volume, dysuria, enuresis Musculoskeletal: Negative for arthralgias and back pain.  Skin: Negative for color change and rash.  Allergic/Immunologic: Negative for immunocompromised state.  Neurological: Positive for weakness.  Negative for tremors, syncope, speech difficulty Hematological: Does not bruise/bleed easily.  All other systems reviewed and are negative   Past Medical History:  Diagnosis Date  . Adenomatous polyp of colon   . Anemia   . Arthritis   . Asthma   . Carotid artery disease ([place])   . Carpal tunnel syndrome   . Chronic renal insufficiency    Stage 3 Kidney disease  . Coronary artery disease   . Depression with  anxiety   . Diabetes mellitus    type II  . Diverticulosis of colon (without mention of hemorrhage)   . GERD (gastroesophageal reflux disease)   . Heart murmur   .  History of hiatal hernia   . Hyperlipidemia   . Hypertension   . Hypothyroidism   . PVD (peripheral vascular disease) ([place])    Past Surgical History:  Procedure Laterality Date  . ABDOMINAL AORTAGRAM N/A 12/18/2013   Procedure: ABDOMINAL [name];  Surgeon: Wellington Hampshire, MD;  Location: [place] CATH LAB;  Service: Cardiovascular;  Laterality: N/A;  . ABDOMINAL AORTOGRAM W/LOWER EXTREMITY N/A 11/16/2016   Procedure: Abdominal Aortogram w/Lower Extremity;  Surgeon: Wellington Hampshire, MD;  Location: [place] CV LAB;  Service: Cardiovascular;  Laterality: N/A;  . ABDOMINAL AORTOGRAM W/LOWER EXTREMITY N/A 01/25/2017   Procedure: Abdominal Aortogram w/Lower Extremity;  Surgeon: Wellington Hampshire, MD;  Location: [place] CV LAB;  Service: Cardiovascular;  Laterality: N/A;  only completed Lower Extremity  . ABDOMINAL AORTOGRAM W/LOWER EXTREMITY N/A 02/28/2018   Procedure: ABDOMINAL AORTOGRAM W/LOWER EXTREMITY;  Surgeon: Wellington Hampshire, MD;  Location: [place] CV LAB;  Service: Cardiovascular;  Laterality: N/A;  . ABDOMINAL AORTOGRAM W/LOWER EXTREMITY N/A 08/27/2020   Procedure: ABDOMINAL AORTOGRAM W/LOWER EXTREMITY;  Surgeon: Marty Heck, MD;  Location: [place] CV LAB;  Service: Cardiovascular;  Laterality: N/A;  . ABDOMINAL HYSTERECTOMY    . APPENDECTOMY    . APPLICATION OF WOUND VAC Right 02/13/2017   Procedure: APPLICATION OF INCISIONAL WOUND VAC RIGHT GROIN;  Surgeon: Elam Dutch, MD;  Location: [place];  Service: Vascular;  Laterality: Right;  . CHOLECYSTECTOMY    . CORONARY ARTERY BYPASS GRAFT  2004   x4  . ENDARTERECTOMY FEMORAL Left 11/23/2016   Procedure: ENDARTERECTOMY OF LEFT EXTERNAL COMMON FEMORAL ARTERY WITH EXTENDED LEFT PROFUNDOPLASTY;  Surgeon: Elam Dutch, MD;  Location: [place];  Service: Vascular;  Laterality: Left;  . ENDARTERECTOMY FEMORAL Right 02/13/2017   Procedure: ENDARTERECTOMY RIGHT FEMORAL ARTERY WITH PROFUNDAPLASTY;  Surgeon: Elam Dutch,  MD;  Location: [facility] OR;  Service: Vascular;  Laterality: Right;  . ESOPHAGOGASTRODUODENOSCOPY N/A 08/02/2017   Procedure: ESOPHAGOGASTRODUODENOSCOPY (EGD);  Surgeon: Ladene Artist, MD;  Location: [facility] ENDOSCOPY;  Service: Endoscopy;  Laterality: N/A;  . FEMORAL-POPLITEAL BYPASS GRAFT Left 11/23/2016   Procedure: LEFT FEMORAL-BELOW KNEE POPLITEAL ARTERY BYPASS;  Surgeon: Elam Dutch, MD;  Location: [place];  Service: Vascular;  Laterality: Left;  . FEMORAL-POPLITEAL BYPASS GRAFT Right 02/13/2017   Procedure: BYPASS GRAFT RIGHT FEMORAL-BELOW KNEE POPLITEAL ARTERY;  Surgeon: Elam Dutch, MD;  Location: [place];  Service: Vascular;  Laterality: Right;  . KNEE SURGERY Right 2013   arthroscopy  . PATCH ANGIOPLASTY Right 02/13/2017   Procedure: VEIN PATCH ANGIOPLASTY RIGHT POPLITEAL ARTERY AND HEMASHIELD PATCH ANGIOPLASTY OF RIGHT FEMORAL ARTERY;  Surgeon: Elam Dutch, MD;  Location: [place];  Service: Vascular;  Laterality: Right;  . PERIPHERAL VASCULAR BALLOON ANGIOPLASTY  02/28/2018   Procedure: PERIPHERAL VASCULAR BALLOON ANGIOPLASTY;  Surgeon: Wellington Hampshire, MD;  Location: [place] CV LAB;  Service: Cardiovascular;;  Left Fem-pop Bypass  . PERIPHERAL VASCULAR INTERVENTION Right 08/28/2020   Procedure: PERIPHERAL VASCULAR INTERVENTION;  Surgeon: Elam Dutch, MD;  Location: [place] CV LAB;  Service: Cardiovascular;  Laterality: Right;  . PERIPHERAL VASCULAR THROMBECTOMY Right 08/28/2020   Procedure: LYSIS RECHECK;  Surgeon: Elam Dutch, MD;  Location: [place] CV LAB;  Service: Cardiovascular;  Laterality: Right;  Social History:  reports that she quit smoking about 4 years ago. Her smoking use included cigarettes. She has a 30.00 pack-year smoking history. She has never used smokeless tobacco. She reports that she does not drink alcohol and does not use drugs.   Allergies  Allergen Reactions  . Dapagliflozin     Other reaction(s): Other Syncope    Family  History  Problem Relation Age of Onset  . Diabetes Mother   . Breast cancer Mother   . Heart disease Father   . Colon polyps Daughter   . Breast cancer Daughter   . Colon cancer Neg Hx   . Esophageal cancer Neg Hx   . Stomach cancer Neg Hx   . Rectal cancer Neg Hx      Prior to Admission medications   Medication Sig Start Date End Date Taking? Authorizing Provider  acetaminophen-codeine (TYLENOL #3) 300-30 MG tablet Take 1-2 tablets by mouth every 6 (six) hours as needed for moderate pain. Patient not taking: Reported on 09/30/2020 08/14/20   Faustino Congress, NP  albuterol (PROVENTIL HFA;VENTOLIN HFA) 108 (90 BASE) MCG/ACT inhaler Inhale 2 puffs into the lungs every 6 (six) hours as needed for wheezing or shortness of breath.    [provider]  ALPRAZolam Duanne Moron) 1 MG tablet Take 1 mg by mouth 3 (three) times daily as needed for anxiety. 05/11/11   [provider]  amLODipine (NORVASC) 2.5 MG tablet Take 1 tablet (2.5 mg total) by mouth daily. 09/08/20   Wellington Hampshire, MD  aspirin EC 81 MG tablet Take 81 mg by mouth at bedtime.    [provider]  atorvastatin (LIPITOR) 80 MG tablet Take 80 mg by mouth daily.     [provider]  calcitRIOL (ROCALTROL) 0.25 MCG capsule Take 0.25 mcg by mouth every Monday, Wednesday, and Friday.  07/02/17   [provider]  carvedilol (COREG) 12.5 MG tablet TAKE 1 TABLET BY MOUTH TWICE A DAY Patient taking differently: Take 12.5 mg by mouth 2 (two) times daily with a meal. 06/15/20   Wellington Hampshire, MD  cetirizine (ZYRTEC) 10 MG tablet Take 10 mg by mouth daily. 06/30/17   [provider]  clopidogrel (PLAVIX) 75 MG tablet Take 1 tablet (75 mg total) by mouth daily with breakfast. Patient not taking: Reported on 10/22/2020 08/29/20   Baglia, Corrina, PA-C  famotidine (PEPCID) 40 MG tablet TAKE 1 TABLET BY MOUTH EVERY DAY Patient taking differently: Take 40 mg by mouth daily. 05/01/20   Wellington Hampshire, MD  fenofibrate micronized (LOFIBRA) 200 MG capsule Take 200 mg by mouth daily.    [provider]  ferrous sulfate 325 (65 FE) MG tablet Take 325 mg by mouth 3 (three) times daily. 05/10/15   [provider]  furosemide (LASIX) 40 MG tablet Take 40 mg by mouth daily.  05/08/11   [provider]  HYDROcodone-acetaminophen (NORCO/VICODIN) 5-325 MG tablet Take 1 tablet by mouth every 8 (eight) hours as needed. Patient not taking: Reported on 09/30/2020 09/08/20   Wellington Hampshire, MD  Insulin Glargine [facility]) 100 UNIT/ML SOPN Inject 20-60 Units into the skin 2 (two) times daily before a meal. Per sliding scale 01/26/17   [provider]  Omega-3 Fatty Acids (FISH OIL) 1000 MG CAPS Take 1,000 mg by mouth 2 (two) times daily.    [provider]  PARoxetine (PAXIL) 40 MG tablet Take 60 mg by mouth at bedtime.     [provider]  promethazine (PHENERGAN) 25 MG tablet Take 25 mg by mouth daily as needed for nausea or vomiting.  08/06/16   [provider]  tiZANidine (ZANAFLEX) 4 MG tablet Take 1 tablet (4 mg total) by mouth every 6 (six) hours as needed for muscle spasms. 08/14/20   Faustino Congress, NP  Vitamin D, Ergocalciferol, (DRISDOL) 50000 UNITS CAPS capsule Take 50,000 Units by mouth 2 (two) times a week. Takes on Mondays and Fridays    [provider]    Physical Exam: BP (!) 188/86   Pulse 98   Temp 98 F (36.7 C) (Oral)   Resp (!) 26   Ht 5\' 4"  (1.626 m)   Wt 54.4 kg   SpO2 99%   BMI 20.60 kg/m   . General: 65 y.o. year-old female well developed well nourished in no acute distress.  Alert and oriented x3. Marland Kitchen HEENT: Dry mucous membrane.  NCAT, EOMI . Neck: Supple, trachea medial . Cardiovascular: Regular rate and rhythm with no rubs or gallops.  No thyromegaly or JVD noted.  No lower extremity edema. 2/4 pulses in all 4 extremities. Marland Kitchen Respiratory: Clear to auscultation with no wheezes or  rales. Good inspiratory effort. . Abdomen: Soft, nontender nondistended with normal bowel sounds x4 quadrants. . 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 Neuro: CN II-XII intact, strength 5/5 x 4, sensation, reflexes intact . Skin: No ulcerative lesions noted or rashes . Psychiatry: Judgement and insight appear normal. Mood is appropriate for condition and setting          Labs on Admission:  Basic Metabolic Panel: Recent Labs  Lab 12/10/20 1815  NA 123*  K 4.6  CL 87*  CO2 13*  GLUCOSE 617*  BUN 43*  CREATININE 1.94*  CALCIUM 7.9*   Liver Function Tests: Recent Labs  Lab 12/10/20 1815  AST 18  ALT 13  ALKPHOS 60  BILITOT 2.1*  PROT 6.8  ALBUMIN 3.3*   No results for input(s): LIPASE, AMYLASE in the last 168 hours. No results for input(s): AMMONIA in the last 168 hours. CBC: Recent Labs  Lab 12/10/20 1815  WBC 27.6*  NEUTROABS 23.4*  HGB 11.7*  HCT 37.5  MCV 93.8  PLT 364   Cardiac Enzymes: No results for input(s): CKTOTAL, CKMB, CKMBINDEX, TROPONINI in the last 168 hours.  BNP (last 3 results) No results for input(s): BNP in the last 8760 hours.  ProBNP (last 3 results) No results for input(s): PROBNP in the last 8760 hours.  CBG: Recent Labs  Lab 12/10/20 2032 12/10/20 2134 12/10/20 2209 12/10/20 2254 12/11/20 0001  GLUCAP 502* 430* 432* 350* 302*    Radiological Exams on Admission: CT Head Wo Contrast  Result Date: 12/10/2020 CLINICAL DATA:  Syncope EXAM: CT HEAD WITHOUT CONTRAST TECHNIQUE: Contiguous axial images were obtained from the base of the skull through the vertex without intravenous contrast. COMPARISON:  03/07/2018 FINDINGS: Brain: No acute intracranial abnormality. Specifically, no hemorrhage, hydrocephalus, mass lesion, acute infarction, or significant intracranial injury. Vascular: No hyperdense vessel or unexpected calcification. Skull: No acute calvarial abnormality. Sinuses/Orbits: No acute findings Other: None  IMPRESSION: No acute intracranial abnormality. Electronically Signed   By: Rolm Baptise M.D.   On: 12/10/2020 18:30   DG Chest Port 1 View  Result Date: 12/10/2020 CLINICAL DATA:  Cough EXAM: PORTABLE CHEST 1 VIEW COMPARISON:  08/01/2017 FINDINGS: Cardiac shadow is within normal limits. Aortic calcifications are seen. Postsurgical changes are again noted. The lungs are clear. No bony abnormality  is seen. IMPRESSION: No active disease. Electronically Signed   By: Inez Catalina M.D.   On: 12/10/2020 20:03    EKG: I independently viewed the EKG done and my findings are as followed: Sinus tachycardia at a rate of 101 bpm prolonged QTc (531 ms)  Assessment/Plan Present on Admission: . DKA (diabetic ketoacidosis) ([place]) . Essential hypertension . GERD . Hyperlipidemia  Principal Problem:   DKA (diabetic ketoacidosis) ([facility]) Active Problems:   Hyperlipidemia   Essential hypertension   GERD   Syncope, vasovagal   Leukocytosis   Prolonged QT interval   Chronic kidney disease   Gangrenous toe ([facility])   Hypoalbuminemia   Dehydration   Hyperglycemia   High anion gap metabolic acidosis   Hypertensive urgency   Hypothyroidism   Anion gap metabolic acidosis secondary to DKA Hyperglycemia secondary to poorly controlled type 2 diabetes mellitus Beta hydroxybutyric acid > 8.0; anion gap 23 Continue insulin drip, IV LR with IV potassium per DKA protocol Transition IV LR to D5LR when serum glucose reaches 250mg /dL Continue serial BMP and VBG Continue to monitor for anion gap closure prior to transitioning patient to subcu insulin Continue NPO  Syncope in the setting of history of significant peripheral vascular disease Patient's recurrent symptoms appear to be due to vasovagal, considering symptoms usually occur when she goes from sitting to standing position Orthostatic vital signs will be checked Continue telemetry and watch for arrhythmias Check serial troponins  EKG shows sinus tachycardia  at rate of 101 bpm with prolonged QTc Echocardiogram will be done in the morning to rule out significant aortic stenosis or other outflow obstruction, and also to evaluate EF and to rule out segmental/Regional wall motion abnormalities.  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 Patient already followed-up with her vascular surgeon as she declined amputation of the toes  CKD stage IV BUN/creatinine 43/1.94 (baseline creatinine 1.7-2.0) Continue IV hydration Renally adjust medications, avoid nephrotoxic agents/dehydration/hypotension  Dehydration Continue IV hydration  Hypoalbuminemia Protein supplement will be provided once patient resumes oral intake  Hyperlipidemia Continue home meds when patient resumes oral intake   Hypothyroidism Continue home meds when patient resumes oral intake  DVT prophylaxis: Lovenox  Code Status: Full code  Family Communication: Daughter at bedside (all questions answered to satisfaction)  Disposition Plan:  Patient is from:                        home Anticipated DC to:                   home Anticipated DC date:               1 day Anticipated DC barriers:         Patient requires inpatient management due to DKA and syncope   Consults called: None  Admission status: Observation    Bernadette Hoit MD [facility]  12/11/2020, 12:07 AM

## 2020-12-11 ENCOUNTER — Observation Stay (HOSPITAL_COMMUNITY): Payer: Medicare Other

## 2020-12-11 ENCOUNTER — Encounter (HOSPITAL_COMMUNITY): Payer: Self-pay | Admitting: Internal Medicine

## 2020-12-11 ENCOUNTER — Encounter (HOSPITAL_COMMUNITY)
Admission: EM | Disposition: A | Discharge status: Home / Self Care | Payer: Self-pay | Source: Home / Self Care | Attending: Internal Medicine

## 2020-12-11 DIAGNOSIS — R55 Syncope and collapse: Secondary | ICD-10-CM

## 2020-12-11 DIAGNOSIS — Z20822 Contact with and (suspected) exposure to covid-19: Secondary | ICD-10-CM | POA: Diagnosis present

## 2020-12-11 DIAGNOSIS — I248 Other forms of acute ischemic heart disease: Secondary | ICD-10-CM

## 2020-12-11 DIAGNOSIS — I160 Hypertensive urgency: Secondary | ICD-10-CM | POA: Diagnosis not present

## 2020-12-11 DIAGNOSIS — E43 Unspecified severe protein-calorie malnutrition: Secondary | ICD-10-CM | POA: Insufficient documentation

## 2020-12-11 DIAGNOSIS — N184 Chronic kidney disease, stage 4 (severe): Secondary | ICD-10-CM | POA: Diagnosis present

## 2020-12-11 DIAGNOSIS — I739 Peripheral vascular disease, unspecified: Secondary | ICD-10-CM | POA: Diagnosis not present

## 2020-12-11 DIAGNOSIS — I161 Hypertensive emergency: Secondary | ICD-10-CM | POA: Diagnosis present

## 2020-12-11 DIAGNOSIS — I2510 Atherosclerotic heart disease of native coronary artery without angina pectoris: Secondary | ICD-10-CM

## 2020-12-11 DIAGNOSIS — I214 Non-ST elevation (NSTEMI) myocardial infarction: Principal | ICD-10-CM

## 2020-12-11 DIAGNOSIS — I10 Essential (primary) hypertension: Secondary | ICD-10-CM | POA: Diagnosis not present

## 2020-12-11 DIAGNOSIS — E8809 Other disorders of plasma-protein metabolism, not elsewhere classified: Secondary | ICD-10-CM | POA: Diagnosis present

## 2020-12-11 DIAGNOSIS — E1122 Type 2 diabetes mellitus with diabetic chronic kidney disease: Secondary | ICD-10-CM | POA: Diagnosis present

## 2020-12-11 DIAGNOSIS — I25110 Atherosclerotic heart disease of native coronary artery with unstable angina pectoris: Secondary | ICD-10-CM | POA: Diagnosis present

## 2020-12-11 DIAGNOSIS — E871 Hypo-osmolality and hyponatremia: Secondary | ICD-10-CM | POA: Diagnosis present

## 2020-12-11 DIAGNOSIS — F32A Depression, unspecified: Secondary | ICD-10-CM | POA: Diagnosis present

## 2020-12-11 DIAGNOSIS — I5042 Chronic combined systolic (congestive) and diastolic (congestive) heart failure: Secondary | ICD-10-CM | POA: Diagnosis present

## 2020-12-11 DIAGNOSIS — I130 Hypertensive heart and chronic kidney disease with heart failure and stage 1 through stage 4 chronic kidney disease, or unspecified chronic kidney disease: Secondary | ICD-10-CM | POA: Diagnosis present

## 2020-12-11 DIAGNOSIS — E1110 Type 2 diabetes mellitus with ketoacidosis without coma: Secondary | ICD-10-CM | POA: Diagnosis present

## 2020-12-11 DIAGNOSIS — E1140 Type 2 diabetes mellitus with diabetic neuropathy, unspecified: Secondary | ICD-10-CM | POA: Diagnosis present

## 2020-12-11 DIAGNOSIS — J45909 Unspecified asthma, uncomplicated: Secondary | ICD-10-CM | POA: Diagnosis present

## 2020-12-11 DIAGNOSIS — Z6820 Body mass index (BMI) 20.0-20.9, adult: Secondary | ICD-10-CM | POA: Diagnosis not present

## 2020-12-11 DIAGNOSIS — E785 Hyperlipidemia, unspecified: Secondary | ICD-10-CM

## 2020-12-11 DIAGNOSIS — N179 Acute kidney failure, unspecified: Secondary | ICD-10-CM | POA: Diagnosis present

## 2020-12-11 DIAGNOSIS — E039 Hypothyroidism, unspecified: Secondary | ICD-10-CM | POA: Diagnosis present

## 2020-12-11 DIAGNOSIS — R112 Nausea with vomiting, unspecified: Secondary | ICD-10-CM | POA: Diagnosis present

## 2020-12-11 DIAGNOSIS — I6529 Occlusion and stenosis of unspecified carotid artery: Secondary | ICD-10-CM | POA: Diagnosis present

## 2020-12-11 DIAGNOSIS — D649 Anemia, unspecified: Secondary | ICD-10-CM | POA: Diagnosis present

## 2020-12-11 DIAGNOSIS — E860 Dehydration: Secondary | ICD-10-CM | POA: Diagnosis present

## 2020-12-11 DIAGNOSIS — E1152 Type 2 diabetes mellitus with diabetic peripheral angiopathy with gangrene: Secondary | ICD-10-CM | POA: Diagnosis present

## 2020-12-11 DIAGNOSIS — D72829 Elevated white blood cell count, unspecified: Secondary | ICD-10-CM | POA: Diagnosis present

## 2020-12-11 HISTORY — PX: LEFT HEART CATH AND CORONARY ANGIOGRAPHY: CATH118249

## 2020-12-11 LAB — GLUCOSE, CAPILLARY
Glucose-Capillary: 187 mg/dL — ABNORMAL HIGH (ref 70–99)
Glucose-Capillary: 199 mg/dL — ABNORMAL HIGH (ref 70–99)
Glucose-Capillary: 199 mg/dL — ABNORMAL HIGH (ref 70–99)
Glucose-Capillary: 157 mg/dL — ABNORMAL HIGH (ref 70–99)
Glucose-Capillary: 162 mg/dL — ABNORMAL HIGH (ref 70–99)
Glucose-Capillary: 134 mg/dL — ABNORMAL HIGH (ref 70–99)
Glucose-Capillary: 119 mg/dL — ABNORMAL HIGH (ref 70–99)
Glucose-Capillary: 140 mg/dL — ABNORMAL HIGH (ref 70–99)
Glucose-Capillary: 129 mg/dL — ABNORMAL HIGH (ref 70–99)
Glucose-Capillary: 130 mg/dL — ABNORMAL HIGH (ref 70–99)
Glucose-Capillary: 143 mg/dL — ABNORMAL HIGH (ref 70–99)
Glucose-Capillary: 136 mg/dL — ABNORMAL HIGH (ref 70–99)
Glucose-Capillary: 164 mg/dL — ABNORMAL HIGH (ref 70–99)
Glucose-Capillary: 160 mg/dL — ABNORMAL HIGH (ref 70–99)
Glucose-Capillary: 117 mg/dL — ABNORMAL HIGH (ref 70–99)
Glucose-Capillary: 169 mg/dL — ABNORMAL HIGH (ref 70–99)
Glucose-Capillary: 176 mg/dL — ABNORMAL HIGH (ref 70–99)
Glucose-Capillary: 158 mg/dL — ABNORMAL HIGH (ref 70–99)
Glucose-Capillary: 148 mg/dL — ABNORMAL HIGH (ref 70–99)

## 2020-12-11 LAB — BASIC METABOLIC PANEL
Anion gap: 15 (ref 5–15)
Anion gap: 8 (ref 5–15)
Anion gap: 10 (ref 5–15)
Anion gap: 7 (ref 5–15)
BUN: 36 mg/dL — ABNORMAL HIGH (ref 8–23)
BUN: 33 mg/dL — ABNORMAL HIGH (ref 8–23)
BUN: 32 mg/dL — ABNORMAL HIGH (ref 8–23)
BUN: 27 mg/dL — ABNORMAL HIGH (ref 8–23)
CO2: 17 mmol/L — ABNORMAL LOW (ref 22–32)
CO2: 23 mmol/L (ref 22–32)
CO2: 22 mmol/L (ref 22–32)
CO2: 23 mmol/L (ref 22–32)
Calcium: 8.2 mg/dL — ABNORMAL LOW (ref 8.9–10.3)
Calcium: 8 mg/dL — ABNORMAL LOW (ref 8.9–10.3)
Calcium: 7.9 mg/dL — ABNORMAL LOW (ref 8.9–10.3)
Calcium: 7.9 mg/dL — ABNORMAL LOW (ref 8.9–10.3)
Chloride: 101 mmol/L (ref 98–111)
Chloride: 101 mmol/L (ref 98–111)
Chloride: 100 mmol/L (ref 98–111)
Chloride: 103 mmol/L (ref 98–111)
Creatinine, Ser: 1.54 mg/dL — ABNORMAL HIGH (ref 0.44–1.00)
Creatinine, Ser: 1.3 mg/dL — ABNORMAL HIGH (ref 0.44–1.00)
Creatinine, Ser: 1.39 mg/dL — ABNORMAL HIGH (ref 0.44–1.00)
Creatinine, Ser: 1.36 mg/dL — ABNORMAL HIGH (ref 0.44–1.00)
GFR, Estimated: 37 mL/min — ABNORMAL LOW (ref >60)
GFR, Estimated: 45 mL/min — ABNORMAL LOW (ref >60)
GFR, Estimated: 42 mL/min — ABNORMAL LOW (ref >60)
GFR, Estimated: 43 mL/min — ABNORMAL LOW (ref >60)
Glucose, Bld: 269 mg/dL — ABNORMAL HIGH (ref 70–99)
Glucose, Bld: 142 mg/dL — ABNORMAL HIGH (ref 70–99)
Glucose, Bld: 125 mg/dL — ABNORMAL HIGH (ref 70–99)
Glucose, Bld: 221 mg/dL — ABNORMAL HIGH (ref 70–99)
Potassium: 3 mmol/L — ABNORMAL LOW (ref 3.5–5.1)
Potassium: 3.2 mmol/L — ABNORMAL LOW (ref 3.5–5.1)
Potassium: 5.5 mmol/L — ABNORMAL HIGH (ref 3.5–5.1)
Potassium: 4.4 mmol/L (ref 3.5–5.1)
Sodium: 133 mmol/L — ABNORMAL LOW (ref 135–145)
Sodium: 132 mmol/L — ABNORMAL LOW (ref 135–145)
Sodium: 132 mmol/L — ABNORMAL LOW (ref 135–145)
Sodium: 133 mmol/L — ABNORMAL LOW (ref 135–145)

## 2020-12-11 LAB — MAGNESIUM: Magnesium: 1.8 mg/dL (ref 1.7–2.4)

## 2020-12-11 LAB — COMPREHENSIVE METABOLIC PANEL
ALT: 13 U/L (ref 0–44)
AST: 39 U/L (ref 15–41)
Albumin: 2.9 g/dL — ABNORMAL LOW (ref 3.5–5.0)
Alkaline Phosphatase: 50 U/L (ref 38–126)
Anion gap: 9 (ref 5–15)
BUN: 33 mg/dL — ABNORMAL HIGH (ref 8–23)
CO2: 22 mmol/L (ref 22–32)
Calcium: 7.9 mg/dL — ABNORMAL LOW (ref 8.9–10.3)
Chloride: 100 mmol/L (ref 98–111)
Creatinine, Ser: 1.27 mg/dL — ABNORMAL HIGH (ref 0.44–1.00)
GFR, Estimated: 47 mL/min — ABNORMAL LOW (ref >60)
Glucose, Bld: 161 mg/dL — ABNORMAL HIGH (ref 70–99)
Potassium: 3.1 mmol/L — ABNORMAL LOW (ref 3.5–5.1)
Sodium: 131 mmol/L — ABNORMAL LOW (ref 135–145)
Total Bilirubin: 0.7 mg/dL (ref 0.3–1.2)
Total Protein: 6 g/dL — ABNORMAL LOW (ref 6.5–8.1)

## 2020-12-11 LAB — MRSA PCR SCREENING
MRSA by PCR: NEGATIVE
MRSA by PCR: NEGATIVE

## 2020-12-11 LAB — BETA-HYDROXYBUTYRIC ACID
Beta-Hydroxybutyric Acid: 2.89 mmol/L — ABNORMAL HIGH (ref 0.05–0.27)
Beta-Hydroxybutyric Acid: 0.2 mmol/L (ref 0.05–0.27)
Beta-Hydroxybutyric Acid: 0.1 mmol/L (ref 0.05–0.27)
Beta-Hydroxybutyric Acid: 0.14 mmol/L (ref 0.05–0.27)
Beta-Hydroxybutyric Acid: 0.07 mmol/L (ref 0.05–0.27)

## 2020-12-11 LAB — CBC
HCT: 32.7 % — ABNORMAL LOW (ref 36.0–46.0)
Hemoglobin: 11.2 g/dL — ABNORMAL LOW (ref 12.0–15.0)
MCH: 29.5 pg (ref 26.0–34.0)
MCHC: 34.3 g/dL (ref 30.0–36.0)
MCV: 86.1 fL (ref 80.0–100.0)
Platelets: 358 10*3/uL (ref 150–400)
RBC: 3.8 MIL/uL — ABNORMAL LOW (ref 3.87–5.11)
RDW: 13.6 % (ref 11.5–15.5)
WBC: 25.5 10*3/uL — ABNORMAL HIGH (ref 4.0–10.5)
nRBC: 0 % (ref 0.0–0.2)

## 2020-12-11 LAB — TROPONIN I (HIGH SENSITIVITY)
Troponin I (High Sensitivity): 3477 ng/L (ref <18)
Troponin I (High Sensitivity): 11345 ng/L (ref <18)
Troponin I (High Sensitivity): 20778 ng/L (ref <18)
Troponin I (High Sensitivity): 19799 ng/L (ref <18)

## 2020-12-11 LAB — ECHOCARDIOGRAM COMPLETE
AR max vel: 2.41 cm2
AV Area VTI: 2.14 cm2
AV Area mean vel: 2.2 cm2
AV Mean grad: 2.3 mmHg
AV Peak grad: 4.2 mmHg
Ao pk vel: 1.02 m/s
Area-P 1/2: 4.89 cm2
Height: 64 in
S' Lateral: 5.13 cm
Weight: 2014.12 oz

## 2020-12-11 LAB — HEMOGLOBIN A1C
Hgb A1c MFr Bld: 12.5 % — ABNORMAL HIGH (ref 4.8–5.6)
Mean Plasma Glucose: 312 mg/dL

## 2020-12-11 LAB — PHOSPHORUS: Phosphorus: 1.8 mg/dL — ABNORMAL LOW (ref 2.5–4.6)

## 2020-12-11 LAB — APTT: aPTT: 57 seconds — ABNORMAL HIGH (ref 24–36)

## 2020-12-11 LAB — PROTIME-INR
INR: 1.2 (ref 0.8–1.2)
Prothrombin Time: 15.4 seconds — ABNORMAL HIGH (ref 11.4–15.2)

## 2020-12-11 LAB — CBG MONITORING, ED
Glucose-Capillary: 228 mg/dL — ABNORMAL HIGH (ref 70–99)
Glucose-Capillary: 302 mg/dL — ABNORMAL HIGH (ref 70–99)

## 2020-12-11 LAB — HIV ANTIBODY (ROUTINE TESTING W REFLEX): HIV Screen 4th Generation wRfx: NONREACTIVE

## 2020-12-11 SURGERY — LEFT HEART CATH AND CORONARY ANGIOGRAPHY
Anesthesia: LOCAL

## 2020-12-11 MED ORDER — INSULIN ASPART 100 UNIT/ML IJ SOLN: 0.0000 [IU] | INTRAMUSCULAR | Status: DC

## 2020-12-11 MED ORDER — SODIUM CHLORIDE 0.9 % WEIGHT BASED INFUSION: 1.0000 mL/kg/h | INTRAVENOUS | Status: AC

## 2020-12-11 MED ORDER — ONDANSETRON HCL 4 MG/2ML IJ SOLN: 4.0000 mg | Freq: Four times a day (QID) | INTRAMUSCULAR | Status: DC | PRN

## 2020-12-11 MED ORDER — POTASSIUM CHLORIDE 10 MEQ/100ML IV SOLN
10.0000 meq | INTRAVENOUS | Status: AC
  Administered 2020-12-11 (×3): 10 meq via INTRAVENOUS
  Filled 2020-12-11 (×3): qty 100

## 2020-12-11 MED ORDER — ENOXAPARIN SODIUM 60 MG/0.6ML IJ SOSY
55.0000 mg | PREFILLED_SYRINGE | Freq: Once | INTRAMUSCULAR | Status: AC
  Administered 2020-12-11: 55 mg via SUBCUTANEOUS
  Filled 2020-12-11: qty 0.6

## 2020-12-11 MED ORDER — HEPARIN SODIUM (PORCINE) 1000 UNIT/ML IJ SOLN
INTRAMUSCULAR | Status: AC
  Filled 2020-12-11: qty 1

## 2020-12-11 MED ORDER — ASPIRIN EC 81 MG PO TBEC
81.0000 mg | DELAYED_RELEASE_TABLET | Freq: Every day | ORAL | Status: DC
  Administered 2020-12-12: 81 mg via ORAL
  Filled 2020-12-11 (×2): qty 1

## 2020-12-11 MED ORDER — MAGNESIUM SULFATE 2 GM/50ML IV SOLN
2.0000 g | Freq: Once | INTRAVENOUS | Status: AC
  Administered 2020-12-11: 2 g via INTRAVENOUS
  Filled 2020-12-11: qty 50

## 2020-12-11 MED ORDER — FENTANYL CITRATE (PF) 100 MCG/2ML IJ SOLN
INTRAMUSCULAR | Status: DC | PRN
  Administered 2020-12-11: 25 ug via INTRAVENOUS

## 2020-12-11 MED ORDER — IOHEXOL 350 MG/ML SOLN
INTRAVENOUS | Status: DC | PRN
  Administered 2020-12-11: 40 mL

## 2020-12-11 MED ORDER — LIDOCAINE HCL (PF) 1 % IJ SOLN
INTRAMUSCULAR | Status: AC
  Filled 2020-12-11: qty 30

## 2020-12-11 MED ORDER — HYDRALAZINE HCL 20 MG/ML IJ SOLN: 10.0000 mg | INTRAMUSCULAR | Status: AC | PRN

## 2020-12-11 MED ORDER — DEXTROSE IN LACTATED RINGERS 5 % IV SOLN: INTRAVENOUS | Status: DC

## 2020-12-11 MED ORDER — LACTATED RINGERS IV SOLN: INTRAVENOUS | Status: DC

## 2020-12-11 MED ORDER — HEPARIN SODIUM (PORCINE) 1000 UNIT/ML IJ SOLN
INTRAMUSCULAR | Status: DC | PRN
Start: 2020-12-11 — End: 2020-12-11
  Administered 2020-12-11: 3000 [IU] via INTRAVENOUS

## 2020-12-11 MED ORDER — PROCHLORPERAZINE EDISYLATE 10 MG/2ML IJ SOLN
10.0000 mg | Freq: Four times a day (QID) | INTRAMUSCULAR | Status: DC | PRN
  Administered 2020-12-12: 10 mg via INTRAVENOUS
  Filled 2020-12-11 (×3): qty 2

## 2020-12-11 MED ORDER — DEXTROSE 50 % IV SOLN
0.0000 mL | INTRAVENOUS | Status: DC | PRN
Start: 2020-12-11 — End: 2020-12-12

## 2020-12-11 MED ORDER — SODIUM CHLORIDE 0.9 % IV SOLN: INTRAVENOUS | Status: DC

## 2020-12-11 MED ORDER — ALPRAZOLAM 0.5 MG PO TABS
0.5000 mg | ORAL_TABLET | Freq: Three times a day (TID) | ORAL | Status: DC | PRN
  Administered 2020-12-11 – 2020-12-12 (×2): 0.5 mg via ORAL
  Filled 2020-12-11 (×2): qty 1

## 2020-12-11 MED ORDER — SODIUM CHLORIDE 0.9% FLUSH: 3.0000 mL | INTRAVENOUS | Status: DC | PRN

## 2020-12-11 MED ORDER — SODIUM CHLORIDE 0.9 % IV SOLN: 250.0000 mL | INTRAVENOUS | Status: DC | PRN

## 2020-12-11 MED ORDER — INSULIN GLARGINE 100 UNIT/ML ~~LOC~~ SOLN: 20.0000 [IU] | Freq: Every day | SUBCUTANEOUS | Status: DC

## 2020-12-11 MED ORDER — HEPARIN (PORCINE) IN NACL 1000-0.9 UT/500ML-% IV SOLN
INTRAVENOUS | Status: AC
  Filled 2020-12-11: qty 1000

## 2020-12-11 MED ORDER — CHLORHEXIDINE GLUCONATE CLOTH 2 % EX PADS
6.0000 | MEDICATED_PAD | Freq: Every day | CUTANEOUS | Status: DC
  Administered 2020-12-11: 6 via TOPICAL

## 2020-12-11 MED ORDER — CARVEDILOL 3.125 MG PO TABS
3.1250 mg | ORAL_TABLET | Freq: Two times a day (BID) | ORAL | Status: DC
  Administered 2020-12-12 (×2): 3.125 mg via ORAL
  Filled 2020-12-11 (×2): qty 1

## 2020-12-11 MED ORDER — ENOXAPARIN SODIUM 60 MG/0.6ML IJ SOSY
55.0000 mg | PREFILLED_SYRINGE | Freq: Two times a day (BID) | INTRAMUSCULAR | Status: DC
  Administered 2020-12-11 – 2020-12-12 (×2): 55 mg via SUBCUTANEOUS
  Filled 2020-12-11 (×2): qty 0.6

## 2020-12-11 MED ORDER — ATORVASTATIN CALCIUM 80 MG PO TABS
80.0000 mg | ORAL_TABLET | Freq: Every day | ORAL | Status: DC
  Administered 2020-12-11 – 2020-12-12 (×2): 80 mg via ORAL
  Filled 2020-12-11: qty 2
  Filled 2020-12-11: qty 1

## 2020-12-11 MED ORDER — HEPARIN (PORCINE) IN NACL 1000-0.9 UT/500ML-% IV SOLN
INTRAVENOUS | Status: DC | PRN
  Administered 2020-12-11 (×2): 500 mL

## 2020-12-11 MED ORDER — MORPHINE SULFATE (PF) 2 MG/ML IV SOLN
2.0000 mg | INTRAVENOUS | Status: DC | PRN
  Administered 2020-12-11 – 2020-12-12 (×7): 2 mg via INTRAVENOUS
  Filled 2020-12-11 (×7): qty 1

## 2020-12-11 MED ORDER — MIDAZOLAM HCL 2 MG/2ML IJ SOLN
INTRAMUSCULAR | Status: DC | PRN
  Administered 2020-12-11: 1 mg via INTRAVENOUS

## 2020-12-11 MED ORDER — ACETAMINOPHEN 325 MG PO TABS: 650.0000 mg | ORAL_TABLET | ORAL | Status: DC | PRN

## 2020-12-11 MED ORDER — ASPIRIN EC 81 MG PO TBEC: 81.0000 mg | DELAYED_RELEASE_TABLET | Freq: Every day | ORAL | Status: DC

## 2020-12-11 MED ORDER — SODIUM CHLORIDE 0.9% FLUSH: 3.0000 mL | Freq: Two times a day (BID) | INTRAVENOUS | Status: DC

## 2020-12-11 MED ORDER — MIDAZOLAM HCL 2 MG/2ML IJ SOLN
INTRAMUSCULAR | Status: AC
  Filled 2020-12-11: qty 2

## 2020-12-11 MED ORDER — SODIUM CHLORIDE 0.9% FLUSH
3.0000 mL | Freq: Two times a day (BID) | INTRAVENOUS | Status: DC
  Administered 2020-12-12: 3 mL via INTRAVENOUS

## 2020-12-11 MED ORDER — FENTANYL CITRATE (PF) 100 MCG/2ML IJ SOLN
INTRAMUSCULAR | Status: AC
  Filled 2020-12-11: qty 2

## 2020-12-11 MED ORDER — FAMOTIDINE 20 MG PO TABS
40.0000 mg | ORAL_TABLET | Freq: Every day | ORAL | Status: DC
  Administered 2020-12-11 – 2020-12-12 (×2): 40 mg via ORAL
  Filled 2020-12-11 (×2): qty 2

## 2020-12-11 MED ORDER — FERROUS SULFATE 325 (65 FE) MG PO TABS
325.0000 mg | ORAL_TABLET | Freq: Every day | ORAL | Status: DC
  Administered 2020-12-12: 325 mg via ORAL
  Filled 2020-12-11: qty 1

## 2020-12-11 MED ORDER — LACTATED RINGERS IV BOLUS: 20.0000 mL/kg | Freq: Once | INTRAVENOUS | Status: DC

## 2020-12-11 MED ORDER — ADULT MULTIVITAMIN W/MINERALS CH
1.0000 | ORAL_TABLET | Freq: Every day | ORAL | Status: DC
  Administered 2020-12-12: 1 via ORAL
  Filled 2020-12-11: qty 1

## 2020-12-11 MED ORDER — INSULIN REGULAR(HUMAN) IN NACL 100-0.9 UT/100ML-% IV SOLN
INTRAVENOUS | Status: DC
  Filled 2020-12-11: qty 100

## 2020-12-11 MED ORDER — VERAPAMIL HCL 2.5 MG/ML IV SOLN
INTRAVENOUS | Status: DC | PRN
  Administered 2020-12-11: 10 mL via INTRA_ARTERIAL

## 2020-12-11 MED ORDER — POTASSIUM CHLORIDE 10 MEQ/100ML IV SOLN
10.0000 meq | INTRAVENOUS | Status: AC
  Administered 2020-12-11 (×2): 10 meq via INTRAVENOUS
  Filled 2020-12-11 (×2): qty 100

## 2020-12-11 MED ORDER — LIDOCAINE HCL (PF) 1 % IJ SOLN
INTRAMUSCULAR | Status: DC | PRN
Start: 2020-12-11 — End: 2020-12-11
  Administered 2020-12-11: 5 mL

## 2020-12-11 MED ORDER — PANTOPRAZOLE SODIUM 40 MG IV SOLR
40.0000 mg | INTRAVENOUS | Status: DC
  Administered 2020-12-11 – 2020-12-12 (×2): 40 mg via INTRAVENOUS
  Filled 2020-12-11 (×2): qty 40

## 2020-12-11 MED ORDER — VERAPAMIL HCL 2.5 MG/ML IV SOLN
INTRAVENOUS | Status: AC
  Filled 2020-12-11: qty 2

## 2020-12-11 MED ORDER — CARVEDILOL 3.125 MG PO TABS: 3.1250 mg | ORAL_TABLET | Freq: Two times a day (BID) | ORAL | Status: DC

## 2020-12-11 MED ORDER — LEVOTHYROXINE SODIUM 100 MCG PO TABS
100.0000 ug | ORAL_TABLET | Freq: Every day | ORAL | Status: DC
  Administered 2020-12-12: 100 ug via ORAL
  Filled 2020-12-11: qty 1

## 2020-12-11 SURGICAL SUPPLY — 10 items
CATH INFINITI MULTIPACK ANG 4F (CATHETERS) ×2 IMPLANT
DEVICE RAD TR BAND REGULAR (VASCULAR PRODUCTS) ×2 IMPLANT
GUIDEWIRE INQWIRE 1.5J.035X260 (WIRE) ×1 IMPLANT
INQWIRE 1.5J .035X260CM (WIRE) ×2
KIT HEART LEFT (KITS) ×2 IMPLANT
PACK CARDIAC CATHETERIZATION (CUSTOM PROCEDURE TRAY) ×2 IMPLANT
SHEATH GLIDE SLENDER 4/5FR (SHEATH) ×2 IMPLANT
TRANSDUCER W/STOPCOCK (MISCELLANEOUS) ×2 IMPLANT
TUBING CIL FLEX 10 FLL-RA (TUBING) ×2 IMPLANT
WIRE HI TORQ VERSACORE-J 145CM (WIRE) ×2 IMPLANT

## 2020-12-11 NOTE — H&P (Signed)
History and Physical  [name]:[id number] DOB: [birth year] DOA: 12/10/2020  Referring physician: Milton Ferguson, MD PCP: Nicholes Rough, PA-C  Patient coming from: Home  Chief Complaint: Elevated blood sugar level and high blood pressure  HPI: [name] is a 66 y.o. female with medical history significant for CAD s/p CABG in 2004, type 2 DM with diabetic neuropathy, CKD, moderate carotid stenosis, and PAD s/pleft external iliac/common femoral and profunda endarterectomy with extended profundoplasty, ligation of the left superficial femoral artery and femoral to below the knee popliteal artery bypass with a vein graft in 05/18, and more recently right femoral to below-knee popliteal artery bypass in August 2018 for critical limb ischemia and right thrombolysis and angioplasty of distal anastomosis right femoropopliteal bypass (February 2022) who presents to the emergency department due to several months of several episodes of recurrent syncope which appears to be mainly orthostatic.  Most of the history was obtained from daughter at bedside.  Per daughter, patient was placed on farxiga several months ago and she has been having episodes of syncope without any seizure-like movements and she recently hit her head after she syncopized.  She was taking off [place], but she continued to have episodes of syncope.  Patient also complained of nausea and vomiting which got worse within the past 2 days with difficulty in being able to keep anything down, this was associated with epigastric pain with burning sensation in the chest and throat.  She reported soft BP while having above syncope, she followed up with her kidney doctor a few days ago and he took off all BP meds, but BP became elevated about 2 days ago and it was 190/119 this morning, so EMS was activated and patient was taken to the ED for further evaluation and management.  ED Course:  In the emergency department, she was intermittently  tachypneic, BP was 223/99 and other vital signs were within normal range.  Work-up in the ED shows leukocytosis, BUN/creatinine 43/1.94 (baseline creatinine 1.7-2.0), anion gap 23, hyperglycemia, albumin 3.3, beta hydroxybutyric acid > 8.0.  Influenza A, B and SARS coronavirus 2 was negative. Chest x-ray showed no active disease CT head without contrast showed no acute intracranial abnormality Hydralazine was given due to elevated BP, patient was started on insulin drip per DKA Endo tool, IV hydration was provided.  Hospitalist was asked to admit patient for further evaluation and management.  Review of Systems: Constitutional: Negative for chills and fever.  HENT: Negative for ear pain and sore throat.   Eyes: Negative for pain and visual disturbance.  Respiratory: Negative for cough, chest tightness and shortness of breath.   Cardiovascular: Positive for recurrent episodes of syncope.  Negative for chest pain and palpitations.  Gastrointestinal: Positive for abdominal pain, nausea and vomiting.  Endocrine: Negative for polyphagia and polyuria.  Genitourinary: Negative for decreased urine volume, dysuria, enuresis Musculoskeletal: Negative for arthralgias and back pain.  Skin: Negative for color change and rash.  Allergic/Immunologic: Negative for immunocompromised state.  Neurological: Positive for weakness.  Negative for tremors, syncope, speech difficulty Hematological: Does not bruise/bleed easily.  All other systems reviewed and are negative   Past Medical History:  Diagnosis Date  . Adenomatous polyp of colon   . Anemia   . Arthritis   . Asthma   . Carotid artery disease ([place])   . Carpal tunnel syndrome   . Chronic renal insufficiency    Stage 3 Kidney disease  . Coronary artery disease   . Depression with  anxiety   . Diabetes mellitus    type II  . Diverticulosis of colon (without mention of hemorrhage)   . GERD (gastroesophageal reflux disease)   . Heart murmur   .  History of hiatal hernia   . Hyperlipidemia   . Hypertension   . Hypothyroidism   . PVD (peripheral vascular disease) ([place])    Past Surgical History:  Procedure Laterality Date  . ABDOMINAL AORTAGRAM N/A 12/18/2013   Procedure: ABDOMINAL [name];  Surgeon: Wellington Hampshire, MD;  Location: [place] CATH LAB;  Service: Cardiovascular;  Laterality: N/A;  . ABDOMINAL AORTOGRAM W/LOWER EXTREMITY N/A 11/16/2016   Procedure: Abdominal Aortogram w/Lower Extremity;  Surgeon: Wellington Hampshire, MD;  Location: [place] CV LAB;  Service: Cardiovascular;  Laterality: N/A;  . ABDOMINAL AORTOGRAM W/LOWER EXTREMITY N/A 01/25/2017   Procedure: Abdominal Aortogram w/Lower Extremity;  Surgeon: Wellington Hampshire, MD;  Location: [place] [facility];  Service: Cardiovascular;  Laterality: N/A;  only completed Lower Extremity  . ABDOMINAL AORTOGRAM W/LOWER EXTREMITY N/A 02/28/2018   Procedure: ABDOMINAL AORTOGRAM W/LOWER EXTREMITY;  Surgeon: Wellington Hampshire, MD;  Location: [place] [facility] LAB;  Service: Cardiovascular;  Laterality: N/A;  . ABDOMINAL AORTOGRAM W/LOWER EXTREMITY N/A 08/27/2020   Procedure: ABDOMINAL AORTOGRAM W/LOWER EXTREMITY;  Surgeon: Marty Heck, MD;  Location: [place] [facility] LAB;  Service: Cardiovascular;  Laterality: N/A;  . ABDOMINAL HYSTERECTOMY    . APPENDECTOMY    . APPLICATION OF WOUND VAC Right 02/13/2017   Procedure: APPLICATION OF INCISIONAL WOUND VAC RIGHT GROIN;  Surgeon: Elam Dutch, MD;  Location: [place];  Service: Vascular;  Laterality: Right;  . CHOLECYSTECTOMY    . CORONARY ARTERY BYPASS GRAFT  2004   x4  . ENDARTERECTOMY FEMORAL Left 11/23/2016   Procedure: ENDARTERECTOMY OF LEFT EXTERNAL COMMON FEMORAL ARTERY WITH EXTENDED LEFT PROFUNDOPLASTY;  Surgeon: Elam Dutch, MD;  Location: [place];  Service: Vascular;  Laterality: Left;  . ENDARTERECTOMY FEMORAL Right 02/13/2017   Procedure: ENDARTERECTOMY RIGHT FEMORAL ARTERY WITH PROFUNDAPLASTY;  Surgeon: Elam Dutch,  MD;  Location: [facility] OR;  Service: Vascular;  Laterality: Right;  . ESOPHAGOGASTRODUODENOSCOPY N/A 08/02/2017   Procedure: ESOPHAGOGASTRODUODENOSCOPY (EGD);  Surgeon: Ladene Artist, MD;  Location: [facility] ENDOSCOPY;  Service: Endoscopy;  Laterality: N/A;  . FEMORAL-POPLITEAL BYPASS GRAFT Left 11/23/2016   Procedure: LEFT FEMORAL-BELOW KNEE POPLITEAL ARTERY BYPASS;  Surgeon: Elam Dutch, MD;  Location: [place];  Service: Vascular;  Laterality: Left;  . FEMORAL-POPLITEAL BYPASS GRAFT Right 02/13/2017   Procedure: BYPASS GRAFT RIGHT FEMORAL-BELOW KNEE POPLITEAL ARTERY;  Surgeon: Elam Dutch, MD;  Location: [place];  Service: Vascular;  Laterality: Right;  . KNEE SURGERY Right 2013   arthroscopy  . PATCH ANGIOPLASTY Right 02/13/2017   Procedure: VEIN PATCH ANGIOPLASTY RIGHT POPLITEAL ARTERY AND HEMASHIELD PATCH ANGIOPLASTY OF RIGHT FEMORAL ARTERY;  Surgeon: Elam Dutch, MD;  Location: [place];  Service: Vascular;  Laterality: Right;  . PERIPHERAL VASCULAR BALLOON ANGIOPLASTY  02/28/2018   Procedure: PERIPHERAL VASCULAR BALLOON ANGIOPLASTY;  Surgeon: Wellington Hampshire, MD;  Location: [facility] LAB;  Service: Cardiovascular;;  Left Fem-pop Bypass  . PERIPHERAL VASCULAR INTERVENTION Right 08/28/2020   Procedure: PERIPHERAL VASCULAR INTERVENTION;  Surgeon: Elam Dutch, MD;  Location: [place] CV LAB;  Service: Cardiovascular;  Laterality: Right;  . PERIPHERAL VASCULAR THROMBECTOMY Right 08/28/2020   Procedure: LYSIS RECHECK;  Surgeon: Elam Dutch, MD;  Location: [place] CV LAB;  Service: Cardiovascular;  Laterality: Right;  Social History:  reports that she quit smoking about 4 years ago. Her smoking use included cigarettes. She has a 30.00 pack-year smoking history. She has never used smokeless tobacco. She reports that she does not drink alcohol and does not use drugs.   Allergies  Allergen Reactions  . Dapagliflozin     Other reaction(s): Other Syncope    Family  History  Problem Relation Age of Onset  . Diabetes Mother   . Breast cancer Mother   . Heart disease Father   . Colon polyps Daughter   . Breast cancer Daughter   . Colon cancer Neg Hx   . Esophageal cancer Neg Hx   . Stomach cancer Neg Hx   . Rectal cancer Neg Hx      Prior to Admission medications   Medication Sig Start Date End Date Taking? Authorizing Provider  acetaminophen-codeine (TYLENOL #3) 300-30 MG tablet Take 1-2 tablets by mouth every 6 (six) hours as needed for moderate pain. Patient not taking: Reported on 09/30/2020 08/14/20   Faustino Congress, NP  albuterol (PROVENTIL HFA;VENTOLIN HFA) 108 (90 BASE) MCG/ACT inhaler Inhale 2 puffs into the lungs every 6 (six) hours as needed for wheezing or shortness of breath.    [provider]  ALPRAZolam Duanne Moron) 1 MG tablet Take 1 mg by mouth 3 (three) times daily as needed for anxiety. 05/11/11   [provider]  amLODipine (NORVASC) 2.5 MG tablet Take 1 tablet (2.5 mg total) by mouth daily. 09/08/20   Wellington Hampshire, MD  aspirin EC 81 MG tablet Take 81 mg by mouth at bedtime.    [provider]  atorvastatin (LIPITOR) 80 MG tablet Take 80 mg by mouth daily.     [provider]  calcitRIOL (ROCALTROL) 0.25 MCG capsule Take 0.25 mcg by mouth every Monday, Wednesday, and Friday.  07/02/17   [provider]  carvedilol (COREG) 12.5 MG tablet TAKE 1 TABLET BY MOUTH TWICE A DAY Patient taking differently: Take 12.5 mg by mouth 2 (two) times daily with a meal. 06/15/20   Wellington Hampshire, MD  cetirizine (ZYRTEC) 10 MG tablet Take 10 mg by mouth daily. 06/30/17   [provider]  clopidogrel (PLAVIX) 75 MG tablet Take 1 tablet (75 mg total) by mouth daily with breakfast. Patient not taking: Reported on 10/22/2020 08/29/20   Baglia, Corrina, PA-C  famotidine (PEPCID) 40 MG tablet TAKE 1 TABLET BY MOUTH EVERY DAY Patient taking differently: Take 40 mg by mouth daily. 05/01/20   Wellington Hampshire, MD  fenofibrate micronized (LOFIBRA) 200 MG capsule Take 200 mg by mouth daily.    [provider]  ferrous sulfate 325 (65 FE) MG tablet Take 325 mg by mouth 3 (three) times daily. 05/10/15   [provider]  furosemide (LASIX) 40 MG tablet Take 40 mg by mouth daily.  05/08/11   [provider]  HYDROcodone-acetaminophen (NORCO/VICODIN) 5-325 MG tablet Take 1 tablet by mouth every 8 (eight) hours as needed. Patient not taking: Reported on 09/30/2020 09/08/20   Wellington Hampshire, MD  Insulin Glargine [facility]) 100 UNIT/ML SOPN Inject 20-60 Units into the skin 2 (two) times daily before a meal. Per sliding scale 01/26/17   [provider]  Omega-3 Fatty Acids (FISH OIL) 1000 MG CAPS Take 1,000 mg by mouth 2 (two) times daily.    [provider]  PARoxetine (PAXIL) 40 MG tablet Take 60 mg by mouth at bedtime.     [provider]  promethazine (PHENERGAN) 25 MG tablet Take 25 mg by mouth daily as needed for nausea or vomiting.  08/06/16   [provider]  tiZANidine (ZANAFLEX) 4 MG tablet Take 1 tablet (4 mg total) by mouth every 6 (six) hours as needed for muscle spasms. 08/14/20   Faustino Congress, NP  Vitamin D, Ergocalciferol, (DRISDOL) 50000 UNITS CAPS capsule Take 50,000 Units by mouth 2 (two) times a week. Takes on Mondays and Fridays    [provider]    Physical Exam: BP (!) 188/86   Pulse 98   Temp 98 F (36.7 C) (Oral)   Resp (!) 26   Ht 5\' 4"  (1.626 m)   Wt 54.4 kg   SpO2 99%   BMI 20.60 kg/m   . General: 65 y.o. year-old female well developed well nourished in no acute distress.  Alert and oriented x3. Marland Kitchen HEENT: Dry mucous membrane.  NCAT, EOMI . Neck: Supple, trachea medial . Cardiovascular: Regular rate and rhythm with no rubs or gallops.  No thyromegaly or JVD noted.  No lower extremity edema. 2/4 pulses in all 4 extremities. Marland Kitchen Respiratory: Clear to auscultation with no wheezes or  rales. Good inspiratory effort. . Abdomen: Soft, nontender nondistended with normal bowel sounds x4 quadrants. . 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 Neuro: CN II-XII intact, strength 5/5 x 4, sensation, reflexes intact . Skin: No ulcerative lesions noted or rashes . Psychiatry: Judgement and insight appear normal. Mood is appropriate for condition and setting          Labs on Admission:  Basic Metabolic Panel: Recent Labs  Lab 12/10/20 1815  NA 123*  K 4.6  CL 87*  CO2 13*  GLUCOSE 617*  BUN 43*  CREATININE 1.94*  CALCIUM 7.9*   Liver Function Tests: Recent Labs  Lab 12/10/20 1815  AST 18  ALT 13  ALKPHOS 60  BILITOT 2.1*  PROT 6.8  ALBUMIN 3.3*   No results for input(s): LIPASE, AMYLASE in the last 168 hours. No results for input(s): AMMONIA in the last 168 hours. CBC: Recent Labs  Lab 12/10/20 1815  WBC 27.6*  NEUTROABS 23.4*  HGB 11.7*  HCT 37.5  MCV 93.8  PLT 364   Cardiac Enzymes: No results for input(s): CKTOTAL, CKMB, CKMBINDEX, TROPONINI in the last 168 hours.  BNP (last 3 results) No results for input(s): BNP in the last 8760 hours.  ProBNP (last 3 results) No results for input(s): PROBNP in the last 8760 hours.  CBG: Recent Labs  Lab 12/10/20 2032 12/10/20 2134 12/10/20 2209 12/10/20 2254 12/11/20 0001  GLUCAP 502* 430* 432* 350* 302*    Radiological Exams on Admission: CT Head Wo Contrast  Result Date: 12/10/2020 CLINICAL DATA:  Syncope EXAM: CT HEAD WITHOUT CONTRAST TECHNIQUE: Contiguous axial images were obtained from the base of the skull through the vertex without intravenous contrast. COMPARISON:  03/07/2018 FINDINGS: Brain: No acute intracranial abnormality. Specifically, no hemorrhage, hydrocephalus, mass lesion, acute infarction, or significant intracranial injury. Vascular: No hyperdense vessel or unexpected calcification. Skull: No acute calvarial abnormality. Sinuses/Orbits: No acute findings Other: None  IMPRESSION: No acute intracranial abnormality. Electronically Signed   By: Rolm Baptise M.D.   On: 12/10/2020 18:30   DG Chest Port 1 View  Result Date: 12/10/2020 CLINICAL DATA:  Cough EXAM: PORTABLE CHEST 1 VIEW COMPARISON:  08/01/2017 FINDINGS: Cardiac shadow is within normal limits. Aortic calcifications are seen. Postsurgical changes are again noted. The lungs are clear. No bony abnormality  is seen. IMPRESSION: No active disease. Electronically Signed   By: Inez Catalina M.D.   On: 12/10/2020 20:03    EKG: I independently viewed the EKG done and my findings are as followed: Sinus tachycardia at a rate of 101 bpm prolonged QTc (531 ms)  Assessment/Plan Present on Admission: . DKA (diabetic ketoacidosis) ([place]) . Essential hypertension . GERD . Hyperlipidemia  Principal Problem:   DKA (diabetic ketoacidosis) ([facility]) Active Problems:   Hyperlipidemia   Essential hypertension   GERD   Syncope, vasovagal   Leukocytosis   Prolonged QT interval   Chronic kidney disease   Gangrenous toe ([facility])   Hypoalbuminemia   Dehydration   Hyperglycemia   High anion gap metabolic acidosis   Hypertensive urgency   Hypothyroidism   Anion gap metabolic acidosis secondary to DKA Hyperglycemia secondary to poorly controlled type 2 diabetes mellitus Beta hydroxybutyric acid > 8.0; anion gap 23 Continue insulin drip, IV LR with IV potassium per DKA protocol Transition IV LR to D5LR when serum glucose reaches 250mg /dL Continue serial BMP and VBG Continue to monitor for anion gap closure prior to transitioning patient to subcu insulin Continue NPO  Syncope in the setting of history of significant peripheral vascular disease Patient's recurrent symptoms appear to be due to vasovagal, considering symptoms usually occur when she goes from sitting to standing position Orthostatic vital signs will be checked Continue telemetry and watch for arrhythmias High-sensitivity troponin x 1 -3477 EKG shows sinus  tachycardia at rate of 101 bpm with prolonged QTc Echocardiogram will be done in the morning to rule out significant aortic stenosis or other outflow obstruction, and also to evaluate EF and to rule out segmental/Regional wall motion abnormalities.  Carotid artery Dopplers will be checked to rule out hemodynamically significant stenosis  Elevated troponin r/o ACS High-sensitivity troponin 3,477 > 11,345 EKG personally reviewed showed sinus tachycardia at rate of 101 bpm with prolonged QTc Continue telemetry, repeat EKG Patient complains of epigastric burning sensation and abdominal pain, but denies chest pain Cardiology (Dr. Francia Greaves) was consulted and recommended transferring patient to medical service at [facility] with goals to follow-up with patient on consult when patient arrives at [facility]  Prolonged QTc (531 ms) Avoid QT prolonging drugs Magnesium level will be checked  Hypertensive urgency Essential hypertension (uncontrolled) Continue IV labetalol 5 mg every 6 hours as needed for SBP greater than 180  Leukocytosis possibly reactive WBC 27.6; continue to monitor WBC with morning labs  GERD/abdominal pain Continue Protonix Continue IV morphine 2 mg every 4 hours as needed  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 Patient already followed-up with her vascular surgeon as she declined amputation of the toes  CKD stage IV BUN/creatinine 43/1.94 (baseline creatinine 1.7-2.0) Continue IV hydration Renally adjust medications, avoid nephrotoxic agents/dehydration/hypotension  Dehydration Continue IV hydration  Hypoalbuminemia Protein supplement will be provided once patient resumes oral intake  Hyperlipidemia Continue home meds when patient resumes oral intake   Hypothyroidism Continue home meds when patient resumes oral intake  DVT prophylaxis: Lovenox  Code Status: Full code  Family Communication: Daughter at bedside (all questions answered to satisfaction)  Disposition  Plan:  Patient is from:                        home Anticipated DC to:                   home Anticipated DC date:  1 day Anticipated DC barriers:         Patient requires inpatient management due to DKA and syncope   Consults called: None  Admission status: Observation    Bernadette Hoit MD [facility]  12/11/2020, 12:07 AM

## 2020-12-11 NOTE — Progress Notes (Signed)
Initial Nutrition Assessment  DOCUMENTATION CODES:   Severe malnutrition in context of chronic illness  INTERVENTION:   - Once diet advanced, Ensure Enlive po TID, each supplement provides 350 kcal and 20 grams of protein  Pt with decreased oral intake PTA and severe malnutrition. Pt would benefit from nutrient-dense supplement. One Ensure Enlive supplement provides 350 kcal, 20 grams protein, and 44-45 grams carbohydrate vs one Glucerna Shake supplement which provides 220 kcal, 10 grams protein, and 26 grams carbohydrate. Given pt's hx of DM, RD will continue to monitor PO intake and CBG's and adjust supplement regimen as appropriate.   - MVI with minerals daily  NUTRITION DIAGNOSIS:   Severe Malnutrition related to chronic illness (T2DM, PAD) as evidenced by severe muscle depletion,severe fat depletion.  GOAL:   Patient will meet greater than or equal to 90% of their needs  MONITOR:   Diet advancement,Labs,Weight trends,Skin  REASON FOR ASSESSMENT:   Malnutrition Screening Tool    ASSESSMENT:   65 year old female who presented to the ED on 6/02 with hyperglycemia and syncopal episodes. PMH of CAD s/p CABG in 2004, T2DM with diabetic neuropathy, HTN, HLD, CKD stage IV, moderate carotid stenosis, PAD s/p left external iliac/common femoral and profunda endarterectomy with extended profundoplasty, ligation of the left superficial femoral artery and femoral to below the knee popliteal artery bypass with a vein graft and more recently right femoral to below-knee popliteal artery bypass in August 2018 for critical limb ischemia and right thrombolysis and angioplasty of distal anastomosis right femoropopliteal bypass in February 2022. Pt admitted with DKA and hypertensive urgency.   Pt found to have NSTEMI. Plan is for [facility] today.  Spoke with pt at bedside. Pt reports that since February, she has been having multiple episodes of N/V daily. Pt states that she wasn't able to keep solid  food down most of the time but was able to keep down flavored water and chocolate milk. Pt states that some days the N/V would improve and she would be able to eat solid food. On these days pt would consume jello, fruits, and vegetables. Pt reports not being a big meat eater.  Pt states that currently her nausea has improved. Pt is NPO pending LHC later today. Pt states that she last ate on Tuesday of this week and last consumed liquids yesterday. Pt reports that her last episode of vomiting was yesterday.  Pt shares that her UBW over the last several years was 146 lbs. Prior to this, pt weighed in the 230 lb range but intentionally lost weight down to 146 lbs. Pt believes that she now weighs 115 lbs. Weight this morning was 57.1 kg (125.88 lbs).  Reviewed weight history in chart. Pt has experienced a 7.8 kg weight loss since 03/10/20. This is a 12% weight loss in 9 months which is not significant for timeframe. However, pt meets criteria for malnutrition based on NFPE.  Discussed with pt the importance of adequate kcal and protein intake in healing and maintaining lean muscle mass. Pt expresses understanding and is willing to consume oral nutrition supplements once diet advanced.  Medications reviewed and include: IV protonix, D5 in LR @ 125 ml/hr, insulin @ 0.9 ml/hr, IV magnesium sulfate 2 grams once, IV KCl 10 mEq x 4 runs  Labs reviewed: sodium 132, potassium 3.2, BUN 33, creatinine 1.30, phosphorus 1.8 CBG's: 119-350 x 24 hours (trending down)  NUTRITION - FOCUSED PHYSICAL EXAM:  Flowsheet Row Most Recent Value  Orbital Region Severe depletion  Upper Arm  Region Severe depletion  Thoracic and Lumbar Region Severe depletion  Buccal Region Severe depletion  Temple Region Severe depletion  Clavicle Bone Region Severe depletion  Clavicle and Acromion Bone Region Moderate depletion  Scapular Bone Region Moderate depletion  Dorsal Hand Severe depletion  Patellar Region Severe depletion   Anterior Thigh Region Severe depletion  Posterior Calf Region Severe depletion  Edema (RD Assessment) None  Hair Reviewed  Eyes Reviewed  Mouth Reviewed  Skin Reviewed  [ecchymosis]  Nails Reviewed       Diet Order:   Diet Order            Diet NPO time specified  Diet effective now                 EDUCATION NEEDS:   Education needs have been addressed  Skin:  Skin Assessment: Skin Integrity Issues: Stage II: sacrum Other: necrotic toes on R foot  Last BM:  12/10/20  Height:   Ht Readings from Last 1 Encounters:  12/10/20 5\' 4"  (1.626 m)    Weight:   Wt Readings from Last 1 Encounters:  12/11/20 57.1 kg    BMI:  Body mass index is 21.61 kg/m.  Estimated Nutritional Needs:   Kcal:  1700-1900  Protein:  80-95 grams  Fluid:  1.7-1.9 L    Gustavus Bryant, MS, RD, LDN Inpatient Clinical Dietitian Please see [facility] for contact information.

## 2020-12-11 NOTE — Consult Note (Addendum)
Cardiology Consultation:   Patient ID: TAYJA MANZER MRN: [id number]; DOB: 1956-03-06  Admit date: 12/10/2020 Date of Consult: 12/11/2020  PCP:  Nicholes Rough, [place] Providers Cardiologist:  [name], MD   {   Patient Profile:   [name] is a 65 y.o. female with past medical history of CAD (s/p CABG in 2004), carotid artery stenosis, HTN, HLD, uncontrolled Type 2 DM, severe PAD (s/p left iliac and femoral endarterectomy in 2018, right fem-pop in 02/2017, thrombolysis and angioplasty of right fem-pop bypass in 08/2020), and Stage 3-4 CKD who is being seen today for the evaluation of NSTEMI at the request of Dr. Hanley Ben.  History of Present Illness:   Ms. Kraft was last examined by Dr. Fletcher Anon in 09/2020 following a recent hospitalization for ischemic right foot and underwent thrombolysis and angioplasty of her right femoropopliteal bypass. She was overall doing well at the time of her office visit and denied any recurrent foot pain or any recent anginal symptoms.  She presented to [facility] ED on 12/10/2020 for evaluation of elevated blood sugar over the past several days. In talking with the patient today, she reports having syncopal episodes since 08/2020. They occur several times each week and she is unsure how long they last. Reports prodromal dizziness at time but at others, does not have any warning signs. Has experienced bladder incontinence with occasional episodes.Starting two days ago, she started to notice more chest pain and palpitations. Describes a pressure along her left precordium which would occur at rest or with activity. Also had a prolonged syncopal episode yesterday morning as her grandson who was at the house reported she was out for at least 30 minutes. Reports associated dyspnea on exertion over the past few weeks as well. No orthopnea, PND or lower extremity edema.   She was in hypertensive urgency upon arrival with BP elevated to 223/99. Initial labs  showed WBC 27.6, Hgb 11.7, platelets 364, Na+ 123, K+ 4.6, glucose 6.17 and creatinine 1.94.  Was also found to have metabolic acidosis with an anion gap of 23.  COVID-negative. Initial Hs Troponin was elevated to 3477 with repeat values at 113[address] and 20778.  CT Head showed no acute intracranial abnormalities. CXR with no active disease. EKG shows sinus tachycardia, HR 101 with IVCD and prominent LVH.   She was admitted for further evaluation of her DKA and started on an insulin drip. Glucose improved to 134 on most recent check. Repeat BMET shows Na+ 132, K+ 3.2 and creatinine 1.30. She has been started on full-dose Lovenox given her rising troponin values. Denies any chest pain at this current time.    Past Medical History:  Diagnosis Date  . Adenomatous polyp of colon   . Anemia   . Arthritis   . Asthma   . Carotid artery disease ([place])   . Carpal tunnel syndrome   . Chronic renal insufficiency    Stage 3 Kidney disease  . Coronary artery disease   . Depression with anxiety   . Diabetes mellitus    type II  . Diverticulosis of colon (without mention of hemorrhage)   . GERD (gastroesophageal reflux disease)   . Heart murmur   . History of hiatal hernia   . Hyperlipidemia   . Hypertension   . Hypothyroidism   . PVD (peripheral vascular disease) ([place])     Past Surgical History:  Procedure Laterality Date  . ABDOMINAL AORTAGRAM N/A 12/18/2013   Procedure: ABDOMINAL AORTAGRAM;  Surgeon: Wellington Hampshire, MD;  Location: [facility] CATH LAB;  Service: Cardiovascular;  Laterality: N/A;  . ABDOMINAL AORTOGRAM W/LOWER EXTREMITY N/A 11/16/2016   Procedure: Abdominal Aortogram w/Lower Extremity;  Surgeon: Wellington Hampshire, MD;  Location: [place] CV LAB;  Service: Cardiovascular;  Laterality: N/A;  . ABDOMINAL AORTOGRAM W/LOWER EXTREMITY N/A 01/25/2017   Procedure: Abdominal Aortogram w/Lower Extremity;  Surgeon: Wellington Hampshire, MD;  Location: [place] CV LAB;  Service: Cardiovascular;   Laterality: N/A;  only completed Lower Extremity  . ABDOMINAL AORTOGRAM W/LOWER EXTREMITY N/A 02/28/2018   Procedure: ABDOMINAL AORTOGRAM W/LOWER EXTREMITY;  Surgeon: Wellington Hampshire, MD;  Location: [place] CV LAB;  Service: Cardiovascular;  Laterality: N/A;  . ABDOMINAL AORTOGRAM W/LOWER EXTREMITY N/A 08/27/2020   Procedure: ABDOMINAL AORTOGRAM W/LOWER EXTREMITY;  Surgeon: Marty Heck, MD;  Location: [place] CV LAB;  Service: Cardiovascular;  Laterality: N/A;  . ABDOMINAL HYSTERECTOMY    . APPENDECTOMY    . APPLICATION OF WOUND VAC Right 02/13/2017   Procedure: APPLICATION OF INCISIONAL WOUND VAC RIGHT GROIN;  Surgeon: Elam Dutch, MD;  Location: [place];  Service: Vascular;  Laterality: Right;  . CHOLECYSTECTOMY    . CORONARY ARTERY BYPASS GRAFT  2004   x4  . ENDARTERECTOMY FEMORAL Left 11/23/2016   Procedure: ENDARTERECTOMY OF LEFT EXTERNAL COMMON FEMORAL ARTERY WITH EXTENDED LEFT PROFUNDOPLASTY;  Surgeon: Elam Dutch, MD;  Location: [place];  Service: Vascular;  Laterality: Left;  . ENDARTERECTOMY FEMORAL Right 02/13/2017   Procedure: ENDARTERECTOMY RIGHT FEMORAL ARTERY WITH PROFUNDAPLASTY;  Surgeon: Elam Dutch, MD;  Location: [facility] OR;  Service: Vascular;  Laterality: Right;  . ESOPHAGOGASTRODUODENOSCOPY N/A 08/02/2017   Procedure: ESOPHAGOGASTRODUODENOSCOPY (EGD);  Surgeon: Ladene Artist, MD;  Location: [facility] ENDOSCOPY;  Service: Endoscopy;  Laterality: N/A;  . FEMORAL-POPLITEAL BYPASS GRAFT Left 11/23/2016   Procedure: LEFT FEMORAL-BELOW KNEE POPLITEAL ARTERY BYPASS;  Surgeon: Elam Dutch, MD;  Location: [place];  Service: Vascular;  Laterality: Left;  . FEMORAL-POPLITEAL BYPASS GRAFT Right 02/13/2017   Procedure: BYPASS GRAFT RIGHT FEMORAL-BELOW KNEE POPLITEAL ARTERY;  Surgeon: Elam Dutch, MD;  Location: [place];  Service: Vascular;  Laterality: Right;  . KNEE SURGERY Right 2013   arthroscopy  . PATCH ANGIOPLASTY Right 02/13/2017   Procedure: VEIN PATCH  ANGIOPLASTY RIGHT POPLITEAL ARTERY AND HEMASHIELD PATCH ANGIOPLASTY OF RIGHT FEMORAL ARTERY;  Surgeon: Elam Dutch, MD;  Location: [place];  Service: Vascular;  Laterality: Right;  . PERIPHERAL VASCULAR BALLOON ANGIOPLASTY  02/28/2018   Procedure: PERIPHERAL VASCULAR BALLOON ANGIOPLASTY;  Surgeon: Wellington Hampshire, MD;  Location: [place] CV LAB;  Service: Cardiovascular;;  Left Fem-pop Bypass  . PERIPHERAL VASCULAR INTERVENTION Right 08/28/2020   Procedure: PERIPHERAL VASCULAR INTERVENTION;  Surgeon: Elam Dutch, MD;  Location: [place] CV LAB;  Service: Cardiovascular;  Laterality: Right;  . PERIPHERAL VASCULAR THROMBECTOMY Right 08/28/2020   Procedure: LYSIS RECHECK;  Surgeon: Elam Dutch, MD;  Location: [place] CV LAB;  Service: Cardiovascular;  Laterality: Right;     Home Medications:  Prior to Admission medications   Medication Sig Start Date End Date Taking? Authorizing Provider  albuterol (PROVENTIL HFA;VENTOLIN HFA) 108 (90 BASE) MCG/ACT inhaler Inhale 2 puffs into the lungs every 6 (six) hours as needed for wheezing or shortness of breath.   Yes [provider]  ALPRAZolam Duanne Moron) 1 MG tablet Take 1 mg by mouth 3 (three) times daily as needed for anxiety. 05/11/11  Yes [provider]  aspirin  EC 81 MG tablet Take 81 mg by mouth at bedtime.   Yes [provider]  atorvastatin (LIPITOR) 80 MG tablet Take 80 mg by mouth daily.    Yes [provider]  calcitRIOL (ROCALTROL) 0.25 MCG capsule Take 0.25 mcg by mouth every Monday, Wednesday, and Friday.  07/02/17  Yes [provider]  cetirizine (ZYRTEC) 10 MG tablet Take 10 mg by mouth daily. 06/30/17  Yes [provider]  famotidine (PEPCID) 40 MG tablet TAKE 1 TABLET BY MOUTH EVERY DAY Patient taking differently: Take 40 mg by mouth daily. 05/01/20  Yes Wellington Hampshire, MD  fenofibrate micronized (LOFIBRA) 200 MG capsule Take 200 mg by mouth daily.   Yes  [provider]  ferrous sulfate 325 (65 FE) MG tablet Take 325 mg by mouth 3 (three) times daily. 05/10/15  Yes [provider]  Insulin Glargine (BASAGLAR KWIKPEN) 100 UNIT/ML SOPN Inject 20-60 Units into the skin 2 (two) times daily before a meal. Per sliding scale 01/26/17  Yes [provider]  levothyroxine (SYNTHROID) 112 MCG tablet Take 1 tablet by mouth daily. 10/30/20  Yes [provider]  Omega-3 Fatty Acids (FISH OIL) 1000 MG CAPS Take 1,000 mg by mouth 2 (two) times daily.   Yes [provider]  PARoxetine (PAXIL) 40 MG tablet Take 60 mg by mouth at bedtime.    Yes [provider]  promethazine (PHENERGAN) 25 MG tablet Take 25 mg by mouth daily as needed for nausea or vomiting.  08/06/16  Yes [provider]  triamcinolone cream (KENALOG) 0.1 % Apply 1 application topically 2 (two) times daily. 11/27/20  Yes [provider]  Vitamin D, Ergocalciferol, (DRISDOL) 50000 UNITS CAPS capsule Take 50,000 Units by mouth 2 (two) times a week. Takes on Mondays and Fridays   Yes [provider]  acetaminophen-codeine (TYLENOL #3) 300-30 MG tablet Take 1-2 tablets by mouth every 6 (six) hours as needed for moderate pain. Patient not taking: Reported on 09/30/2020 08/14/20   Faustino Congress, NP  amLODipine (NORVASC) 2.5 MG tablet Take 1 tablet (2.5 mg total) by mouth daily. Patient not taking: No sig reported 09/08/20   Wellington Hampshire, MD  carvedilol (COREG) 12.5 MG tablet TAKE 1 TABLET BY MOUTH TWICE A DAY Patient not taking: No sig reported 06/15/20   Wellington Hampshire, MD  clopidogrel (PLAVIX) 75 MG tablet Take 1 tablet (75 mg total) by mouth daily with breakfast. Patient not taking: No sig reported 08/29/20   Baglia, Corrina, PA-C  furosemide (LASIX) 40 MG tablet Take 40 mg by mouth daily.  Patient not taking: Reported on 12/10/2020 05/08/11   [provider]  HYDROcodone-acetaminophen (NORCO/VICODIN) 5-325 MG  tablet Take 1 tablet by mouth every 8 (eight) hours as needed. Patient not taking: No sig reported 09/08/20   Wellington Hampshire, MD  tiZANidine (ZANAFLEX) 4 MG tablet Take 1 tablet (4 mg total) by mouth every 6 (six) hours as needed for muscle spasms. Patient not taking: Reported on 12/10/2020 08/14/20   Faustino Congress, NP  triamcinolone ointment (KENALOG) 0.5 % APPLY TO AFFECTED AREA TWICE A DAY Patient not taking: No sig reported 10/30/20   [provider]    Inpatient Medications: Scheduled Meds: . aspirin EC  81 mg Oral QHS  . atorvastatin  80 mg Oral q1800  . carvedilol  3.125 mg Oral BID WC  . Chlorhexidine Gluconate Cloth  6 each Topical Daily  . enoxaparin (LOVENOX) injection  55 mg Subcutaneous  BID  . pantoprazole (PROTONIX) IV  40 mg Intravenous Q24H   Continuous Infusions: . dextrose 5% lactated ringers Stopped (12/10/20 1954)  . dextrose 5% lactated ringers 125 mL/hr at 12/11/20 0104  . insulin 3.4 mL/hr at 12/11/20 0753  . lactated ringers    . lactated ringers Stopped (12/11/20 0103)   PRN Meds: dextrose, labetalol, morphine injection, ondansetron (ZOFRAN) IV, prochlorperazine  Allergies:    Allergies  Allergen Reactions  . Dapagliflozin     Other reaction(s): Other Syncope    Social History:   Social History   Socioeconomic History  . Marital status: Widowed    Spouse name: Not on file  . Number of children: 2  . Years of education: Not on file  . Highest education level: Not on file  Occupational History  . 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 Smoking status: Current Some Day Smoker    Packs/day: 0.50    Years: 30.00    Pack years: 15.00    Types: Cigarettes    Last attempt to quit: 12/09/2018    Years since quitting: 2.0  . Smokeless tobacco: Never Used  Vaping Use  . Vaping Use: Never used  Substance and Sexual Activity  . Alcohol use: No    Alcohol/week: 0.0 standard  drinks  . Drug use: No  . Sexual activity: Not on file  Other Topics Concern  . Not on file  Social History Narrative   Daily Caffeine   Social Determinants of Health   Financial Resource Strain: Not on file  Food Insecurity: Not on file  Transportation Needs: Not on file  Physical Activity: Not on file  Stress: Not on file  Social Connections: Not on file  Intimate Partner Violence: Not on file    Family History:    Family History  Problem Relation Age of Onset  . Diabetes Mother   . Breast cancer Mother   . Heart disease Father   . Colon polyps Daughter   . Breast cancer Daughter   . Colon cancer Neg Hx   . Esophageal cancer Neg Hx   . Stomach cancer Neg Hx   . Rectal cancer Neg Hx      ROS:  Please see the history of present illness.   All other ROS reviewed and negative.     Physical Exam/Data:   Vitals:   12/11/20 0402 12/11/20 0500 12/11/20 0700 12/11/20 0800  BP:  (!) 180/82 (!) 158/66 (!) 178/90  Pulse:  91 93 89  Resp:  18 19 17   Temp: 97.6 F (36.4 C)   98.1 F (36.7 C)  TempSrc: Oral   Oral  SpO2:  99% 98% 97%  Weight:      Height:        Intake/Output Summary (Last 24 hours) at 12/11/2020 0902 Last data filed at 12/11/2020 0859 Gross per 24 hour  Intake 53.08 ml  Output 300 ml  Net -246.92 ml   Last 3 Weights 12/11/2020 12/10/2020 10/22/2020  Weight (lbs) 125 lb 14.1 oz 120 lb 122 lb  Weight (kg) 57.1 kg 54.432 kg 55.339 kg  Some encounter information is confidential and restricted. Go to Review Flowsheets activity to see all data.     Body mass index is 21.61 kg/m.  General: Thin female appearing in no acute distress.  HEENT: normal Lymph: no adenopathy Neck: no JVD Endocrine:  No thryomegaly Vascular: No carotid bruits;  Cardiac:  normal S1, S2; RRR; 2/6 SEM along RUSB.  Lungs:  clear to auscultation bilaterally, no wheezing, rhonchi or rales  Abd: soft, nontender, no hepatomegaly  Ext: no pitting edema Musculoskeletal:  No  deformities, BUE and BLE strength normal and equal Skin: warm and dry  Neuro:  CNs 2-12 intact, no focal abnormalities noted Psych:  Normal affect   EKG:  The EKG was personally reviewed and demonstrates: Sinus tachycardia, HR 101 with IVCD and prominent LVH.   Telemetry:  Telemetry was personally reviewed and demonstrates: NSR, HR in 70's to 90's.   Relevant CV Studies:  Echocardiogram: 11/2016 Study Conclusions   - Left ventricle: The cavity size was normal. Wall thickness was  normal. Systolic function was normal. The estimated ejection  fraction was in the range of 55% to 60%. Wall motion was normal;  there were no regional wall motion abnormalities. Doppler  parameters are consistent with both elevated ventricular  end-diastolic filling pressure and elevated left atrial filling  pressure.  - Mitral valve: Calcified annulus.  - Left atrium: The atrium was mildly dilated.  - Atrial septum: No defect or patent foramen ovale was identified.  - Pulmonary arteries: PA peak pressure: 46 mm Hg (S).   Laboratory Data:  High Sensitivity Troponin:   Recent Labs  Lab 12/11/20 0026 12/11/20 0246 12/11/20 0454 12/11/20 0656  TROPONINIHS 3,477* 11,345* 20,778* 19,799*     Chemistry Recent Labs  Lab 12/11/20 0026 12/11/20 0454 12/11/20 0656  NA 133* 131* 132*  K 3.0* 3.1* 3.2*  CL 101 100 101  CO2 17* 22 23  GLUCOSE 269* 161* 142*  BUN 36* 33* 33*  CREATININE 1.54* 1.27* 1.30*  CALCIUM 8.2* 7.9* 8.0*  GFRNONAA 37* 47* 45*  ANIONGAP 15 9 8     Recent Labs  Lab 12/10/20 1815 12/11/20 0454  PROT 6.8 6.0*  ALBUMIN 3.3* 2.9*  AST 18 39  ALT 13 13  ALKPHOS 60 50  BILITOT 2.1* 0.7   Hematology Recent Labs  Lab 12/10/20 1815 12/11/20 0454  WBC 27.6* 25.5*  RBC 4.00 3.80*  HGB 11.7* 11.2*  HCT 37.5 32.7*  MCV 93.8 86.1  MCH 29.3 29.5  MCHC 31.2 34.3  RDW 13.6 13.6  PLT 364 358   BNPNo results for input(s): BNP, PROBNP in the last 168 hours.   DDimer No results for input(s): DDIMER in the last 168 hours.   Radiology/Studies:  CT Head Wo Contrast  Result Date: 12/10/2020 CLINICAL DATA:  Syncope EXAM: CT HEAD WITHOUT CONTRAST TECHNIQUE: Contiguous axial images were obtained from the base of the skull through the vertex without intravenous contrast. COMPARISON:  03/07/2018 FINDINGS: Brain: No acute intracranial abnormality. Specifically, no hemorrhage, hydrocephalus, mass lesion, acute infarction, or significant intracranial injury. Vascular: No hyperdense vessel or unexpected calcification. Skull: No acute calvarial abnormality. Sinuses/Orbits: No acute findings Other: None IMPRESSION: No acute intracranial abnormality. Electronically Signed   By: Rolm Baptise M.D.   On: 12/10/2020 18:30   DG Chest Port 1 View  Result Date: 12/10/2020 CLINICAL DATA:  Cough EXAM: PORTABLE CHEST 1 VIEW COMPARISON:  08/01/2017 FINDINGS: Cardiac shadow is within normal limits. Aortic calcifications are seen. Postsurgical changes are again noted. The lungs are clear. No bony abnormality is seen. IMPRESSION: No active disease. Electronically Signed   By: Inez Catalina M.D.   On: 12/10/2020 20:03     Assessment and Plan:   1. NSTEMI - Presented with frequent syncopal episodes over the past few months but started to  experience chest pressure two days prior to admission and increasing palpitations as well.  - Hs Troponin values at 3477 with repeat values at 11345 and 20778.   EKG shows sinus tachycardia, HR 101 with IVCD and prominent LVH. Echocardiogram has been performed with the official report pending.  - Given her presenting symptoms and significantly elevated enzymes, would anticipate a cardiac catheterization for definitive evaluation. She has been NPO since midnight. She left with [facility] to go to [facility] prior to being evaluated by an MD here, therefore I have reached out to the Card Master to arrange for an MD to see her once at [facility] and make the  final decision regarding timing of cath.  - She remains on full-dose Lovenox. Will restart PTA ASA, Atorvastatin and Coreg (at a lower dose initially and titrate as she has not been taking this). Pending further workup this admission, would consider an event monitor at the time of discharge given her recurrent syncopal episodes.   2. CAD - She is s/p CABG in 2004. Given her NSTEMI, would plan for a repeat cardiac catheterization this admission.  - Remains on full-dose Lovenox. Will restart ASA 81mg  daily, Atorvastatin 80mg  daily and Coreg 3.125mg  BID.   3. Hypertensive Urgency - BP was elevated to 223/99 on admission but has improved with administration of IV Labetalol and IV Hydralazine. She reports her Nephrologist recently stopped all of her BP medications due to hypotension. Will restart Coreg at a lower dose of 3.125mg  BID which can be titrated as needed (previously on Coreg 12.5mg  BID and Amlodipine 2.5mg  daily).   4. HLD - LDL was at 54 in 02/2020. Will recheck FLP. Restart Atorvastatin 80mg  daily.   5. PVD - She is s/p left iliac and femoral endarterectomy in 2018, right fem-pop in 02/2017, thrombolysis and angioplasty of right fem-pop bypass in 08/2020. Followed by Dr. Fletcher Anon and Dr. Oneida Alar as an outpatient.  - Continue ASA and statin therapy. She self-discontinued Plavix as an outlined below.   6. DKA - Repeat Hgb A1c pending but was elevated to 12.2 in 08/2020. Glucose was greater than 600 on admission and has improved with insulin drip with glucose at 134 on most recent check. Further management per the admitting team.   7. Positive Occult Blood - By review of phone notes in 11/2020 and she had stopped Plavix. No reports of active bleeding. CBC this AM shows Hgb stable at 11.2 and platelets at 358K.   8. Stage 3-4 CKD - Followed by [facility] as an outpatient. Creatinine previously at 1.7 - 2.0 in 08/2020. At 1.94 this admission and improved to 1.30 today.    Risk  Assessment/Risk Scores:     TIMI Risk Score for Unstable Angina or Non-ST Elevation MI:   The patient's TIMI risk score is 6, which indicates a 41% risk of all cause mortality, new or recurrent myocardial infarction or need for urgent revascularization in the next 14 days.   For questions or updates, please contact [place] Please consult [email or web address] for contact info under    Signed, Erma Heritage, PA-C  12/11/2020 9:02 AM As above, patient seen and examined.  Briefly she is a 65 year old female with past medical history of coronary artery disease status post coronary artery bypass graft, peripheral vascular disease, diabetes mellitus, hypertension, hyperlipidemia, chronic stage III-IV kidney disease with non-ST elevation myocardial infarction.  Patient states she has had intermittent nausea for several months.  She attributes this to Plavix.  She has also  had recurrent syncopal episodes.  Some occur after standing suddenly but others occur without change in position.  She states she does have chest pressure prior to some of her events.  She is unclear about duration of unconsciousness.  She has continued to feel poorly and presented to [facility] yesterday for admission.  Note she had chest pressure yesterday for 15 minutes.  She has ruled in for non-ST elevation myocardial infarction and was transferred for further management.  Note she was also in DKA at the time of admission which has improved.  She is presently pain-free.  Sodium 132, potassium 3.2, BUN 33, creatinine 1.3, troponin 20,778, white blood cell count 25.5, hemoglobin 11.2.  Echocardiogram showed ejection fraction 53%, grade 3 diastolic dysfunction and density in the pulmonary outflow tract of uncertain etiology.  TEE recommended.  Electrocardiogram shows sinus rhythm, IVCD, septal infarct, lateral ST depression.  1 non-ST elevation myocardial infarction-patient is ruled in.  Her electrolyte abnormalities have improved  following treatment of her DKA.  We will plan to proceed with cardiac catheterization.  The risk and benefits including myocardial infarction, CVA and death discussed and she agrees to proceed.  We will need to follow renal function closely after procedure given baseline renal insufficiency.  Continue aspirin, Lovenox, beta-blocker and statin.  2 peripheral vascular disease-continue aspirin and statin.  Patient had thrombolysis and angioplasty of a right femoral-popliteal bypass in March.  3 hypertensive urgency-patient's blood pressure medications were discontinued previously due to hypotension by report.  Significantly elevated at this point though she does also have orthostatic symptoms.  Carvedilol has been reinitiated.  We will follow and advance as needed.  4 hyperlipidemia-continue Lipitor 80 mg daily.  5 coronary artery disease status post coronary artery bypass graft-continue aspirin and statin.  6 diabetic ketoacidosis-improved since admission.  Management per hospitalist service.  7 chronic stage IIIa kidney disease-follow renal function after catheterization.  8 recurrent syncope-some of symptoms sound to be orthostatic mediated.  However there are others that were related to chest pain.  Plan is for cardiac catheterization.  Her echocardiogram shows ejection fraction 35%.  May need to consider LifeVest at discharge followed by repeat echocardiogram in 3 months to see if LV function improves following revascularization.  Follow on telemetry.  9 density in pulmonary outflow tract of uncertain etiology-will likely need transesophageal echocardiogram prior to discharge.  Can perform early next week.  Kirk Ruths, MD

## 2020-12-11 NOTE — Interval H&P Note (Signed)
History and Physical Interval Note:  12/11/2020 3:40 PM  [name]  has presented today for surgery, with the diagnosis of nstemi.  The various methods of treatment have been discussed with the patient and family. After consideration of risks, benefits and other options for treatment, the patient has consented to  Procedure(s): LEFT HEART CATH AND CORONARY ANGIOGRAPHY (N/A) as a surgical intervention.  The patient's history has been reviewed, patient examined, no change in status, stable for surgery.  I have reviewed the patient's chart and labs.  Questions were answered to the patient's satisfaction.     Sherren Mocha

## 2020-12-11 NOTE — Progress Notes (Signed)
*  PRELIMINARY RESULTS* Echocardiogram 2D Echocardiogram has been performed.  [name] 12/11/2020, 9:00 AM

## 2020-12-11 NOTE — Progress Notes (Addendum)
Inpatient Diabetes Program Recommendations  AACE/ADA: New Consensus Statement on Inpatient Glycemic Control (2015)  Target Ranges:  Prepandial:   less than 140 mg/dL      Peak postprandial:   less than 180 mg/dL (1-2 hours)      Critically ill patients:  140 - 180 mg/dL   Lab Results  Component Value Date   GLUCAP 140 (H) 12/11/2020   HGBA1C 12.2 (H) 08/27/2020    Review of Glycemic Control Results for JOPLIN, CANTY (MRN [id number]) as of 12/11/2020 10:17  Ref. Range 12/11/2020 05:20 12/11/2020 07:10 12/11/2020 08:06 12/11/2020 08:56 12/11/2020 09:53  Glucose-Capillary Latest Ref Range: 70 - 99 mg/dL 157 (H) 162 (H) 134 (H) 119 (H) 140 (H)   Diabetes history: DM 2 Outpatient Diabetes medications: Basaglar 20-60 units daily Current orders for Inpatient glycemic control:  IV insulin Inpatient Diabetes Program Recommendations:   Consider transition off insulin drip to Lantus 20 units daily and Novolog sensitive q 4 hours.   Thanks,  Adah Perl, RN, BC-ADM Inpatient Diabetes Coordinator Pager [phone] (8a-5p)  Addendum 1300: spoke with patient at bedside.  She states that she was taking Basaglar 50 units daily.  She reports over a hundred pound weight loss over the past year?  She notes that she has struggled to eat and has lots of nausea and vomiting.  She does live alone but her daughter Hoyle Sauer lives close by and helps.  She gave me permission to call Hoyle Sauer by phone.  Spoke with Hoyle Sauer who reiterates what her mother says.  She does note that patient keeps her blood sugars in the 200's bc she feels badly when it is lower. We discussed weight loss and need for improved glucose control.  Patient see's Dr. Loyal Buba who manages diabetes.  Likely needs less basal insulin to prevent low blood sugars and possibly the addition of rapid acting insulin with meals.  Explained this to patient and daughter.  Will follow while in the hospital.  A1C if February was 12.2%.

## 2020-12-11 NOTE — Progress Notes (Signed)
Date and time results received: 12/11/20 0756  Test: Troponin Critical Value: 19,799  Name of Provider Notified: Roxan Hockey MD  Orders Received? Or Actions Taken?: NA

## 2020-12-11 NOTE — H&P (View-Only) (Signed)
Cardiology Consultation:   Patient ID: [name] MRN: [id number]; DOB: 06-04-1956  Admit date: 12/10/2020 Date of Consult: 12/11/2020  PCP:  Nicholes Rough, [place] Providers Cardiologist:  [name], MD   {   Patient Profile:   [name] is a 65 y.o. female with past medical history of CAD (s/p CABG in 2004), carotid artery stenosis, HTN, HLD, uncontrolled Type 2 DM, severe PAD (s/p left iliac and femoral endarterectomy in 2018, right fem-pop in 02/2017, thrombolysis and angioplasty of right fem-pop bypass in 08/2020), and Stage 3-4 CKD who is being seen today for the evaluation of NSTEMI at the request of Dr. Hanley Ben.  History of Present Illness:   [name] was last examined by Dr. Fletcher Anon in 09/2020 following a recent hospitalization for ischemic right foot and underwent thrombolysis and angioplasty of her right femoropopliteal bypass. She was overall doing well at the time of her office visit and denied any recurrent foot pain or any recent anginal symptoms.  She presented to [facility] ED on 12/10/2020 for evaluation of elevated blood sugar over the past several days. In talking with the patient today, she reports having syncopal episodes since 08/2020. They occur several times each week and she is unsure how long they last. Reports prodromal dizziness at time but at others, does not have any warning signs. Has experienced bladder incontinence with occasional episodes.Starting two days ago, she started to notice more chest pain and palpitations. Describes a pressure along her left precordium which would occur at rest or with activity. Also had a prolonged syncopal episode yesterday morning as her grandson who was at the house reported she was out for at least 30 minutes. Reports associated dyspnea on exertion over the past few weeks as well. No orthopnea, PND or lower extremity edema.   She was in hypertensive urgency upon arrival with BP elevated to 223/99. Initial labs  showed WBC 27.6, Hgb 11.7, platelets 364, Na+ 123, K+ 4.6, glucose 6.17 and creatinine 1.94.  Was also found to have metabolic acidosis with an anion gap of 23.  COVID-negative. Initial Hs Troponin was elevated to 3477 with repeat values at 113[address] and 20778.  CT Head showed no acute intracranial abnormalities. CXR with no active disease. EKG shows sinus tachycardia, HR 101 with IVCD and prominent LVH.   She was admitted for further evaluation of her DKA and started on an insulin drip. Glucose improved to 134 on most recent check. Repeat BMET shows Na+ 132, K+ 3.2 and creatinine 1.30. She has been started on full-dose Lovenox given her rising troponin values. Denies any chest pain at this current time.    Past Medical History:  Diagnosis Date  . Adenomatous polyp of colon   . Anemia   . Arthritis   . Asthma   . Carotid artery disease ([place])   . Carpal tunnel syndrome   . Chronic renal insufficiency    Stage 3 Kidney disease  . Coronary artery disease   . Depression with anxiety   . Diabetes mellitus    type II  . Diverticulosis of colon (without mention of hemorrhage)   . GERD (gastroesophageal reflux disease)   . Heart murmur   . History of hiatal hernia   . Hyperlipidemia   . Hypertension   . Hypothyroidism   . PVD (peripheral vascular disease) ([place])     Past Surgical History:  Procedure Laterality Date  . ABDOMINAL AORTAGRAM N/A 12/18/2013   Procedure: ABDOMINAL AORTAGRAM;  Surgeon: Wellington Hampshire, MD;  Location: [facility] CATH LAB;  Service: Cardiovascular;  Laterality: N/A;  . ABDOMINAL AORTOGRAM W/LOWER EXTREMITY N/A 11/16/2016   Procedure: Abdominal Aortogram w/Lower Extremity;  Surgeon: Wellington Hampshire, MD;  Location: [place] CV LAB;  Service: Cardiovascular;  Laterality: N/A;  . ABDOMINAL AORTOGRAM W/LOWER EXTREMITY N/A 01/25/2017   Procedure: Abdominal Aortogram w/Lower Extremity;  Surgeon: Wellington Hampshire, MD;  Location: [place] CV LAB;  Service: Cardiovascular;   Laterality: N/A;  only completed Lower Extremity  . ABDOMINAL AORTOGRAM W/LOWER EXTREMITY N/A 02/28/2018   Procedure: ABDOMINAL AORTOGRAM W/LOWER EXTREMITY;  Surgeon: Wellington Hampshire, MD;  Location: [place] CV LAB;  Service: Cardiovascular;  Laterality: N/A;  . ABDOMINAL AORTOGRAM W/LOWER EXTREMITY N/A 08/27/2020   Procedure: ABDOMINAL AORTOGRAM W/LOWER EXTREMITY;  Surgeon: Marty Heck, MD;  Location: [place] CV LAB;  Service: Cardiovascular;  Laterality: N/A;  . ABDOMINAL HYSTERECTOMY    . APPENDECTOMY    . APPLICATION OF WOUND VAC Right 02/13/2017   Procedure: APPLICATION OF INCISIONAL WOUND VAC RIGHT GROIN;  Surgeon: Elam Dutch, MD;  Location: [place];  Service: Vascular;  Laterality: Right;  . CHOLECYSTECTOMY    . CORONARY ARTERY BYPASS GRAFT  2004   x4  . ENDARTERECTOMY FEMORAL Left 11/23/2016   Procedure: ENDARTERECTOMY OF LEFT EXTERNAL COMMON FEMORAL ARTERY WITH EXTENDED LEFT PROFUNDOPLASTY;  Surgeon: Elam Dutch, MD;  Location: [place];  Service: Vascular;  Laterality: Left;  . ENDARTERECTOMY FEMORAL Right 02/13/2017   Procedure: ENDARTERECTOMY RIGHT FEMORAL ARTERY WITH PROFUNDAPLASTY;  Surgeon: Elam Dutch, MD;  Location: [facility] OR;  Service: Vascular;  Laterality: Right;  . ESOPHAGOGASTRODUODENOSCOPY N/A 08/02/2017   Procedure: ESOPHAGOGASTRODUODENOSCOPY (EGD);  Surgeon: Ladene Artist, MD;  Location: [facility] ENDOSCOPY;  Service: Endoscopy;  Laterality: N/A;  . FEMORAL-POPLITEAL BYPASS GRAFT Left 11/23/2016   Procedure: LEFT FEMORAL-BELOW KNEE POPLITEAL ARTERY BYPASS;  Surgeon: Elam Dutch, MD;  Location: [place];  Service: Vascular;  Laterality: Left;  . FEMORAL-POPLITEAL BYPASS GRAFT Right 02/13/2017   Procedure: BYPASS GRAFT RIGHT FEMORAL-BELOW KNEE POPLITEAL ARTERY;  Surgeon: Elam Dutch, MD;  Location: [place];  Service: Vascular;  Laterality: Right;  . KNEE SURGERY Right 2013   arthroscopy  . PATCH ANGIOPLASTY Right 02/13/2017   Procedure: VEIN PATCH  ANGIOPLASTY RIGHT POPLITEAL ARTERY AND HEMASHIELD PATCH ANGIOPLASTY OF RIGHT FEMORAL ARTERY;  Surgeon: Elam Dutch, MD;  Location: [place];  Service: Vascular;  Laterality: Right;  . PERIPHERAL VASCULAR BALLOON ANGIOPLASTY  02/28/2018   Procedure: PERIPHERAL VASCULAR BALLOON ANGIOPLASTY;  Surgeon: Wellington Hampshire, MD;  Location: [place] CV LAB;  Service: Cardiovascular;;  Left Fem-pop Bypass  . PERIPHERAL VASCULAR INTERVENTION Right 08/28/2020   Procedure: PERIPHERAL VASCULAR INTERVENTION;  Surgeon: Elam Dutch, MD;  Location: [place] CV LAB;  Service: Cardiovascular;  Laterality: Right;  . PERIPHERAL VASCULAR THROMBECTOMY Right 08/28/2020   Procedure: LYSIS RECHECK;  Surgeon: Elam Dutch, MD;  Location: [place] CV LAB;  Service: Cardiovascular;  Laterality: Right;     Home Medications:  Prior to Admission medications   Medication Sig Start Date End Date Taking? Authorizing Provider  albuterol (PROVENTIL HFA;VENTOLIN HFA) 108 (90 BASE) MCG/ACT inhaler Inhale 2 puffs into the lungs every 6 (six) hours as needed for wheezing or shortness of breath.   Yes [provider]  ALPRAZolam Duanne Moron) 1 MG tablet Take 1 mg by mouth 3 (three) times daily as needed for anxiety. 05/11/11  Yes [provider]  aspirin  EC 81 MG tablet Take 81 mg by mouth at bedtime.   Yes [provider]  atorvastatin (LIPITOR) 80 MG tablet Take 80 mg by mouth daily.    Yes [provider]  calcitRIOL (ROCALTROL) 0.25 MCG capsule Take 0.25 mcg by mouth every Monday, Wednesday, and Friday.  07/02/17  Yes [provider]  cetirizine (ZYRTEC) 10 MG tablet Take 10 mg by mouth daily. 06/30/17  Yes [provider]  famotidine (PEPCID) 40 MG tablet TAKE 1 TABLET BY MOUTH EVERY DAY Patient taking differently: Take 40 mg by mouth daily. 05/01/20  Yes Wellington Hampshire, MD  fenofibrate micronized (LOFIBRA) 200 MG capsule Take 200 mg by mouth daily.   Yes  [provider]  ferrous sulfate 325 (65 FE) MG tablet Take 325 mg by mouth 3 (three) times daily. 05/10/15  Yes [provider]  Insulin Glargine (BASAGLAR KWIKPEN) 100 UNIT/ML SOPN Inject 20-60 Units into the skin 2 (two) times daily before a meal. Per sliding scale 01/26/17  Yes [provider]  levothyroxine (SYNTHROID) 112 MCG tablet Take 1 tablet by mouth daily. 10/30/20  Yes [provider]  Omega-3 Fatty Acids (FISH OIL) 1000 MG CAPS Take 1,000 mg by mouth 2 (two) times daily.   Yes [provider]  PARoxetine (PAXIL) 40 MG tablet Take 60 mg by mouth at bedtime.    Yes [provider]  promethazine (PHENERGAN) 25 MG tablet Take 25 mg by mouth daily as needed for nausea or vomiting.  08/06/16  Yes [provider]  triamcinolone cream (KENALOG) 0.1 % Apply 1 application topically 2 (two) times daily. 11/27/20  Yes [provider]  Vitamin D, Ergocalciferol, (DRISDOL) 50000 UNITS CAPS capsule Take 50,000 Units by mouth 2 (two) times a week. Takes on Mondays and Fridays   Yes [provider]  acetaminophen-codeine (TYLENOL #3) 300-30 MG tablet Take 1-2 tablets by mouth every 6 (six) hours as needed for moderate pain. Patient not taking: Reported on 09/30/2020 08/14/20   Faustino Congress, NP  amLODipine (NORVASC) 2.5 MG tablet Take 1 tablet (2.5 mg total) by mouth daily. Patient not taking: No sig reported 09/08/20   Wellington Hampshire, MD  carvedilol (COREG) 12.5 MG tablet TAKE 1 TABLET BY MOUTH TWICE A DAY Patient not taking: No sig reported 06/15/20   Wellington Hampshire, MD  clopidogrel (PLAVIX) 75 MG tablet Take 1 tablet (75 mg total) by mouth daily with breakfast. Patient not taking: No sig reported 08/29/20   Baglia, Corrina, PA-C  furosemide (LASIX) 40 MG tablet Take 40 mg by mouth daily.  Patient not taking: Reported on 12/10/2020 05/08/11   [provider]  HYDROcodone-acetaminophen (NORCO/VICODIN) 5-325 MG  tablet Take 1 tablet by mouth every 8 (eight) hours as needed. Patient not taking: No sig reported 09/08/20   Wellington Hampshire, MD  tiZANidine (ZANAFLEX) 4 MG tablet Take 1 tablet (4 mg total) by mouth every 6 (six) hours as needed for muscle spasms. Patient not taking: Reported on 12/10/2020 08/14/20   Faustino Congress, NP  triamcinolone ointment (KENALOG) 0.5 % APPLY TO AFFECTED AREA TWICE A DAY Patient not taking: No sig reported 10/30/20   [provider]    Inpatient Medications: Scheduled Meds: . aspirin EC  81 mg Oral QHS  . atorvastatin  80 mg Oral q1800  . carvedilol  3.125 mg Oral BID WC  . Chlorhexidine Gluconate Cloth  6 each Topical Daily  . enoxaparin (LOVENOX) injection  55 mg Subcutaneous  BID  . pantoprazole (PROTONIX) IV  40 mg Intravenous Q24H   Continuous Infusions: . dextrose 5% lactated ringers Stopped (12/10/20 1954)  . dextrose 5% lactated ringers 125 mL/hr at 12/11/20 0104  . insulin 3.4 mL/hr at 12/11/20 0753  . lactated ringers    . lactated ringers Stopped (12/11/20 0103)   PRN Meds: dextrose, labetalol, morphine injection, ondansetron (ZOFRAN) IV, prochlorperazine  Allergies:    Allergies  Allergen Reactions  . Dapagliflozin     Other reaction(s): Other Syncope    Social History:   Social History   Socioeconomic History  . Marital status: Widowed    Spouse name: Not on file  . Number of children: 2  . Years of education: Not on file  . Highest education level: Not on file  Occupational History  . 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 Smoking status: Current Some Day Smoker    Packs/day: 0.50    Years: 30.00    Pack years: 15.00    Types: Cigarettes    Last attempt to quit: 12/09/2018    Years since quitting: 2.0  . Smokeless tobacco: Never Used  Vaping Use  . Vaping Use: Never used  Substance and Sexual Activity  . Alcohol use: No    Alcohol/week: 0.0 standard  drinks  . Drug use: No  . Sexual activity: Not on file  Other Topics Concern  . Not on file  Social History Narrative   Daily Caffeine   Social Determinants of Health   Financial Resource Strain: Not on file  Food Insecurity: Not on file  Transportation Needs: Not on file  Physical Activity: Not on file  Stress: Not on file  Social Connections: Not on file  Intimate Partner Violence: Not on file    Family History:    Family History  Problem Relation Age of Onset  . Diabetes Mother   . Breast cancer Mother   . Heart disease Father   . Colon polyps Daughter   . Breast cancer Daughter   . Colon cancer Neg Hx   . Esophageal cancer Neg Hx   . Stomach cancer Neg Hx   . Rectal cancer Neg Hx      ROS:  Please see the history of present illness.   All other ROS reviewed and negative.     Physical Exam/Data:   Vitals:   12/11/20 0402 12/11/20 0500 12/11/20 0700 12/11/20 0800  BP:  (!) 180/82 (!) 158/66 (!) 178/90  Pulse:  91 93 89  Resp:  18 19 17   Temp: 97.6 F (36.4 C)   98.1 F (36.7 C)  TempSrc: Oral   Oral  SpO2:  99% 98% 97%  Weight:      Height:        Intake/Output Summary (Last 24 hours) at 12/11/2020 0902 Last data filed at 12/11/2020 0859 Gross per 24 hour  Intake 53.08 ml  Output 300 ml  Net -246.92 ml   Last 3 Weights 12/11/2020 12/10/2020 10/22/2020  Weight (lbs) 125 lb 14.1 oz 120 lb 122 lb  Weight (kg) 57.1 kg 54.432 kg 55.339 kg  Some encounter information is confidential and restricted. Go to Review Flowsheets activity to see all data.     Body mass index is 21.61 kg/m.  General: Thin female appearing in no acute distress.  HEENT: normal Lymph: no adenopathy Neck: no JVD Endocrine:  No thryomegaly Vascular: No carotid bruits;  Cardiac:  normal S1, S2; RRR; 2/6 SEM along RUSB.  Lungs:  clear to auscultation bilaterally, no wheezing, rhonchi or rales  Abd: soft, nontender, no hepatomegaly  Ext: no pitting edema Musculoskeletal:  No  deformities, BUE and BLE strength normal and equal Skin: warm and dry  Neuro:  CNs 2-12 intact, no focal abnormalities noted Psych:  Normal affect   EKG:  The EKG was personally reviewed and demonstrates: Sinus tachycardia, HR 101 with IVCD and prominent LVH.   Telemetry:  Telemetry was personally reviewed and demonstrates: NSR, HR in 70's to 90's.   Relevant CV Studies:  Echocardiogram: 11/2016 Study Conclusions   - Left ventricle: The cavity size was normal. Wall thickness was  normal. Systolic function was normal. The estimated ejection  fraction was in the range of 55% to 60%. Wall motion was normal;  there were no regional wall motion abnormalities. Doppler  parameters are consistent with both elevated ventricular  end-diastolic filling pressure and elevated left atrial filling  pressure.  - Mitral valve: Calcified annulus.  - Left atrium: The atrium was mildly dilated.  - Atrial septum: No defect or patent foramen ovale was identified.  - Pulmonary arteries: PA peak pressure: 46 mm Hg (S).   Laboratory Data:  High Sensitivity Troponin:   Recent Labs  Lab 12/11/20 0026 12/11/20 0246 12/11/20 0454 12/11/20 0656  TROPONINIHS 3,477* 11,345* 20,778* 19,799*     Chemistry Recent Labs  Lab 12/11/20 0026 12/11/20 0454 12/11/20 0656  NA 133* 131* 132*  K 3.0* 3.1* 3.2*  CL 101 100 101  CO2 17* 22 23  GLUCOSE 269* 161* 142*  BUN 36* 33* 33*  CREATININE 1.54* 1.27* 1.30*  CALCIUM 8.2* 7.9* 8.0*  GFRNONAA 37* 47* 45*  ANIONGAP 15 9 8     Recent Labs  Lab 12/10/20 1815 12/11/20 0454  PROT 6.8 6.0*  ALBUMIN 3.3* 2.9*  AST 18 39  ALT 13 13  ALKPHOS 60 50  BILITOT 2.1* 0.7   Hematology Recent Labs  Lab 12/10/20 1815 12/11/20 0454  WBC 27.6* 25.5*  RBC 4.00 3.80*  HGB 11.7* 11.2*  HCT 37.5 32.7*  MCV 93.8 86.1  MCH 29.3 29.5  MCHC 31.2 34.3  RDW 13.6 13.6  PLT 364 358   BNPNo results for input(s): BNP, PROBNP in the last 168 hours.   DDimer No results for input(s): DDIMER in the last 168 hours.   Radiology/Studies:  CT Head Wo Contrast  Result Date: 12/10/2020 CLINICAL DATA:  Syncope EXAM: CT HEAD WITHOUT CONTRAST TECHNIQUE: Contiguous axial images were obtained from the base of the skull through the vertex without intravenous contrast. COMPARISON:  03/07/2018 FINDINGS: Brain: No acute intracranial abnormality. Specifically, no hemorrhage, hydrocephalus, mass lesion, acute infarction, or significant intracranial injury. Vascular: No hyperdense vessel or unexpected calcification. Skull: No acute calvarial abnormality. Sinuses/Orbits: No acute findings Other: None IMPRESSION: No acute intracranial abnormality. Electronically Signed   By: Rolm Baptise M.D.   On: 12/10/2020 18:30   DG Chest Port 1 View  Result Date: 12/10/2020 CLINICAL DATA:  Cough EXAM: PORTABLE CHEST 1 VIEW COMPARISON:  08/01/2017 FINDINGS: Cardiac shadow is within normal limits. Aortic calcifications are seen. Postsurgical changes are again noted. The lungs are clear. No bony abnormality is seen. IMPRESSION: No active disease. Electronically Signed   By: Inez Catalina M.D.   On: 12/10/2020 20:03     Assessment and Plan:   1. NSTEMI - Presented with frequent syncopal episodes over the past few months but started to  experience chest pressure two days prior to admission and increasing palpitations as well.  - Hs Troponin values at 3477 with repeat values at 11345 and 20778.   EKG shows sinus tachycardia, HR 101 with IVCD and prominent LVH. Echocardiogram has been performed with the official report pending.  - Given her presenting symptoms and significantly elevated enzymes, would anticipate a cardiac catheterization for definitive evaluation. She has been NPO since midnight. She left with [facility] to go to [facility] prior to being evaluated by an MD here, therefore I have reached out to the Card Master to arrange for an MD to see her once at [facility] and make the  final decision regarding timing of cath.  - She remains on full-dose Lovenox. Will restart PTA ASA, Atorvastatin and Coreg (at a lower dose initially and titrate as she has not been taking this). Pending further workup this admission, would consider an event monitor at the time of discharge given her recurrent syncopal episodes.   2. CAD - She is s/p CABG in 2004. Given her NSTEMI, would plan for a repeat cardiac catheterization this admission.  - Remains on full-dose Lovenox. Will restart ASA 81mg  daily, Atorvastatin 80mg  daily and Coreg 3.125mg  BID.   3. Hypertensive Urgency - BP was elevated to 223/99 on admission but has improved with administration of IV Labetalol and IV Hydralazine. She reports her Nephrologist recently stopped all of her BP medications due to hypotension. Will restart Coreg at a lower dose of 3.125mg  BID which can be titrated as needed (previously on Coreg 12.5mg  BID and Amlodipine 2.5mg  daily).   4. HLD - LDL was at 54 in 02/2020. Will recheck FLP. Restart Atorvastatin 80mg  daily.   5. PVD - She is s/p left iliac and femoral endarterectomy in 2018, right fem-pop in 02/2017, thrombolysis and angioplasty of right fem-pop bypass in 08/2020. Followed by Dr. Fletcher Anon and Dr. Oneida Alar as an outpatient.  - Continue ASA and statin therapy. She self-discontinued Plavix as an outlined below.   6. DKA - Repeat Hgb A1c pending but was elevated to 12.2 in 08/2020. Glucose was greater than 600 on admission and has improved with insulin drip with glucose at 134 on most recent check. Further management per the admitting team.   7. Positive Occult Blood - By review of phone notes in 11/2020 and she had stopped Plavix. No reports of active bleeding. CBC this AM shows Hgb stable at 11.2 and platelets at 358K.   8. Stage 3-4 CKD - Followed by [facility] as an outpatient. Creatinine previously at 1.7 - 2.0 in 08/2020. At 1.94 this admission and improved to 1.30 today.    Risk  Assessment/Risk Scores:     TIMI Risk Score for Unstable Angina or Non-ST Elevation MI:   The patient's TIMI risk score is 6, which indicates a 41% risk of all cause mortality, new or recurrent myocardial infarction or need for urgent revascularization in the next 14 days.   For questions or updates, please contact [place] Please consult [email or web address] for contact info under    Signed, Erma Heritage, PA-C  12/11/2020 9:02 AM As above, patient seen and examined.  Briefly she is a 65 year old female with past medical history of coronary artery disease status post coronary artery bypass graft, peripheral vascular disease, diabetes mellitus, hypertension, hyperlipidemia, chronic stage III-IV kidney disease with non-ST elevation myocardial infarction.  Patient states she has had intermittent nausea for several months.  She attributes this to Plavix.  She has also  had recurrent syncopal episodes.  Some occur after standing suddenly but others occur without change in position.  She states she does have chest pressure prior to some of her events.  She is unclear about duration of unconsciousness.  She has continued to feel poorly and presented to [facility] yesterday for admission.  Note she had chest pressure yesterday for 15 minutes.  She has ruled in for non-ST elevation myocardial infarction and was transferred for further management.  Note she was also in DKA at the time of admission which has improved.  She is presently pain-free.  Sodium 132, potassium 3.2, BUN 33, creatinine 1.3, troponin 20,778, white blood cell count 25.5, hemoglobin 11.2.  Echocardiogram showed ejection fraction 03%, grade 3 diastolic dysfunction and density in the pulmonary outflow tract of uncertain etiology.  TEE recommended.  Electrocardiogram shows sinus rhythm, IVCD, septal infarct, lateral ST depression.  1 non-ST elevation myocardial infarction-patient is ruled in.  Her electrolyte abnormalities have improved  following treatment of her DKA.  We will plan to proceed with cardiac catheterization.  The risk and benefits including myocardial infarction, CVA and death discussed and she agrees to proceed.  We will need to follow renal function closely after procedure given baseline renal insufficiency.  Continue aspirin, Lovenox, beta-blocker and statin.  2 peripheral vascular disease-continue aspirin and statin.  Patient had thrombolysis and angioplasty of a right femoral-popliteal bypass in March.  3 hypertensive urgency-patient's blood pressure medications were discontinued previously due to hypotension by report.  Significantly elevated at this point though she does also have orthostatic symptoms.  Carvedilol has been reinitiated.  We will follow and advance as needed.  4 hyperlipidemia-continue Lipitor 80 mg daily.  5 coronary artery disease status post coronary artery bypass graft-continue aspirin and statin.  6 diabetic ketoacidosis-improved since admission.  Management per hospitalist service.  7 chronic stage IIIa kidney disease-follow renal function after catheterization.  8 recurrent syncope-some of symptoms sound to be orthostatic mediated.  However there are others that were related to chest pain.  Plan is for cardiac catheterization.  Her echocardiogram shows ejection fraction 35%.  May need to consider LifeVest at discharge followed by repeat echocardiogram in 3 months to see if LV function improves following revascularization.  Follow on telemetry.  9 density in pulmonary outflow tract of uncertain etiology-will likely need transesophageal echocardiogram prior to discharge.  Can perform early next week.  Kirk Ruths, MD

## 2020-12-11 NOTE — Progress Notes (Signed)
Patient Demographics:    [name], is a 65 y.o. female, DOB - Jun 15, 1956, FAO:[id number]  Admit date - 12/10/2020   Admitting Physician Bernadette Hoit, DO  Outpatient Primary MD for the patient is Nicholes Rough, PA-C  LOS - 0   Chief Complaint  Patient presents with  . Hyperglycemia        Subjective:    [name] today has no fevers, no emesis,   -No additional/further chest pains at this time  Assessment  & Plan :    Principal Problem:   CAD (coronary artery disease)/NSTEMI Active Problems:   Essential hypertension   PVD (peripheral vascular disease) (HCC)   CHF (congestive heart failure), NYHA class III, chronic, combined/EF 35 % with Global Hypokinesis with Grade III- diastolic Dysfunction   Hypothyroidism   Hyperlipidemia   GERD   DKA (diabetic ketoacidosis) (HCC)   Syncope, vasovagal   Leukocytosis   Prolonged QT interval   Chronic kidney disease   Gangrenous toe (HCC)   Hypoalbuminemia   Dehydration   Hyperglycemia   High anion gap metabolic acidosis   Hypertensive urgency  Brief Summary:- 65 y.o. female with past medical history of CAD (s/p CABG in 2004), carotid artery stenosis, HTN, HLD, uncontrolled Type 2 DM, severe PAD (s/p left iliac and femoral endarterectomy in 2018, right fem-pop in 02/2017, thrombolysis and angioplasty of right fem-pop bypass in 08/2020), and Stage IV CKD  admitted on 12/10/2020 with concerns about DKA as well as hypertensive urgency and chest discomfort and syncope and found to have very elevated troponin levels  A/p 1)CAD/NSTEMI/recurrent syncopal episodes--history of prior CABG in 2004, -presented with chest discomfort, recurrent syncopal episodes and elevated troponin Troponin 3,477>>11,345 >>20,778 >>19,799  -EKG normal sinus rhythm with mild LVH, no acute ST changes -Echo on 12/11/2020 with global hypokinesis EF of 35% and grade 3 diastolic  dysfunction -Cardiology consult appreciated plan for LHC on 12/11/2020 -Continue aspirin, Lipitor and Coreg at this time -Given recurrent syncopal episodes will defer to cardiology team if patient needs Zio patch cardiac monitor upon discharge -Continue therapeutic Lovenox  2)DKA--patient met DKA criteria on admission - -Treated with aggressive IV fluids, IV insulin, glucose check, frequent BMP and beta hydroxybutyric acid checks per Endo tool protocol -May switch from LR/NS to D5 solution when glucose drops below 250, per Endo tool protocol Beta hydroxybutyric acid is down to 0.10 from greater than 8 on admission -Bicarb is up to 23 from 13 on admission -Anion gap is down to 8 from 23 on admission -Should be okay to transition from IV insulin to subcu insulin post LHC once patient is tolerating oral intake- -patient currently n.p.o. awaiting LHC  3)AKI----acute kidney injury on CKD stage -IV   -Worsening renal function due to dehydration in the setting of DKA creatinine on admission=1.94  ,   creatinine is now= down to 1.30 after hydration -- renally adjust medications, avoid nephrotoxic agents / dehydration  / hypotension -Continue hydration due to pending cardiac cath and possible contrast exposure  4) hyponatremia/hypokalemia--- replace and recheck, magnesium is WNL  5)HTN--presented with hypertensive urgency with systolic blood pressure over 220, responded to interventions -May use IV labetalol as needed, continue p.o. Coreg as ordered  6)PVD/right gangrenous toes----- patient with extensive PAD  -  s/pleft external iliac/common femoral and profunda endarterectomy with extended profundoplasty, ligation of the left superficial femoral artery and femoral to below the knee popliteal artery bypass with a vein graft in 11/2016,and more recently right femoral to below-knee popliteal artery bypass in August 2018 for critical limb ischemia and right thrombolysis and angioplasty of distal  anastomosis right femoropopliteal bypass (February 2022) - Followed by Dr. Fletcher Anon and Dr. Oneida Alar as an outpatient.  -Patient has been off Plavix due to concerns about GI bleed, okay to continue aspirin and Lipitor  7) possible GI bleed--- heme positive stools, patient needs antiplatelet agent for CAD and PAD -Please consult GI prior to discharge from the hospital -Monitor H&H and transfuse as clinically indicated  8) hypothyroidism--- continue levothyroxine  9) tobacco abuse--- smoking cessation strongly advised  Disposition/Need for in-Hospital Stay- patient unable to be discharged at this time due to --NSTEMI requiring further cardiac interventions and DKA and hypertensive urgency requiring further adjustments and monitoring*  Status is: Inpatient  Remains inpatient appropriate because:Please see disposition above   Disposition: The patient is from: Home              Anticipated d/c is to: Home              Anticipated d/c date is: 2 days              Patient currently is not medically stable to d/c. Barriers: Not Clinically Stable-   Code Status :  -  Code Status: Full Code   Family Communication:   NA (patient is alert, awake and coherent)   Consults  :  Card  DVT Prophylaxis  :   - SCDs /Lovenox   SCDs Start: 12/10/20 2224    Lab Results  Component Value Date   PLT 358 12/11/2020    Inpatient Medications  Scheduled Meds: . aspirin EC  81 mg Oral Daily  . atorvastatin  80 mg Oral q1800  . carvedilol  3.125 mg Oral BID WC  . Chlorhexidine Gluconate Cloth  6 each Topical Daily  . enoxaparin (LOVENOX) injection  55 mg Subcutaneous BID  . pantoprazole (PROTONIX) IV  40 mg Intravenous Q24H   Continuous Infusions: . sodium chloride    . dextrose 5% lactated ringers Stopped (12/10/20 1954)  . dextrose 5% lactated ringers 125 mL/hr at 12/11/20 0104  . insulin 0.7 Units/hr (12/11/20 1114)  . lactated ringers    . lactated ringers Stopped (12/11/20 0103)   PRN  Meds:.dextrose, labetalol, morphine injection, ondansetron (ZOFRAN) IV, prochlorperazine    Anti-infectives (From admission, onward)   None        Objective:   Vitals:   12/11/20 0700 12/11/20 0800 12/11/20 0945 12/11/20 1110  BP: (!) 158/66 (!) 178/90 (!) 197/83 (!) 189/84  Pulse: 93 89 89 79  Resp: _0 Temp:  98.1 F (36.7 C) 97.9 F (36.6 C) 97.7 F (36.5 C)  TempSrc:  Oral Oral Oral  SpO2: 98% 97% 96% 93%  Weight:      Height:        Wt Readings from Last 3 Encounters:  12/11/20 57.1 kg  10/22/20 55.3 kg  09/30/20 58.2 kg     Intake/Output Summary (Last 24 hours) at 12/11/2020 1132 Last data filed at 12/11/2020 0859 Gross per 24 hour  Intake 53.08 ml  Output 300 ml  Net -246.92 ml    Physical Exam  Gen:- Awake Alert,  In no apparent distress  HEENT:- [place].AT, No sclera  icterus Neck-Supple Neck,No JVD,.  Lungs-  CTAB , fair symmetrical air movement CV- S1, S2 normal, regular, 2/6 SM Abd-  +ve B.Sounds, Abd Soft, No tenderness,    Extremity/Skin:- No  edema, pedal pulses present , gangrenous/black right first and second  toes Psych-affect is appropriate, oriented x3 Neuro-no new focal deficits, no tremors   Data Review:   Micro Results Recent Results (from the past 240 hour(s))  Resp Panel by RT-PCR (Flu A&B, Covid) Nasopharyngeal Swab     Status: None   Collection Time: 12/10/20  8:59 PM   Specimen: Nasopharyngeal Swab; Nasopharyngeal(NP) swabs in vial transport medium  Result Value Ref Range Status   SARS Coronavirus 2 by RT PCR NEGATIVE NEGATIVE Final    Comment: (NOTE) SARS-CoV-2 target nucleic acids are NOT DETECTED.  The SARS-CoV-2 RNA is generally detectable in upper respiratory specimens during the acute phase of infection. The lowest concentration of SARS-CoV-2 viral copies this assay can detect is 138 copies/mL. A negative result does not preclude SARS-Cov-2 infection and should not be used as the sole basis for treatment or other  patient management decisions. A negative result may occur with  improper specimen collection/handling, submission of specimen other than nasopharyngeal swab, presence of viral mutation(s) within the areas targeted by this assay, and inadequate number of viral copies(<138 copies/mL). A negative result must be combined with clinical observations, patient history, and epidemiological information. The expected result is Negative.  Fact Sheet for Patients:  EntrepreneurPulse.com.au  Fact Sheet for Healthcare Providers:  IncredibleEmployment.be  This test is no t yet approved or cleared by the [place] FDA and  has been authorized for detection and/or diagnosis of SARS-CoV-2 by FDA under an Emergency Use Authorization (EUA). This EUA will remain  in effect (meaning this test can be used) for the duration of the COVID-19 declaration under Section 564(b)(1) of the Act, 21 U.S.C.section 360bbb-3(b)(1), unless the authorization is terminated  or revoked sooner.       Influenza A by PCR NEGATIVE NEGATIVE Final   Influenza B by PCR NEGATIVE NEGATIVE Final    Comment: (NOTE) The Xpert Xpress SARS-CoV-2/FLU/RSV plus assay is intended as an aid in the diagnosis of influenza from Nasopharyngeal swab specimens and should not be used as a sole basis for treatment. Nasal washings and aspirates are unacceptable for Xpert Xpress SARS-CoV-2/FLU/RSV testing.  Fact Sheet for Patients: EntrepreneurPulse.com.au  Fact Sheet for Healthcare Providers: IncredibleEmployment.be  This test is not yet approved or cleared by the [place] FDA and has been authorized for detection and/or diagnosis of SARS-CoV-2 by FDA under an Emergency Use Authorization (EUA). This EUA will remain in effect (meaning this test can be used) for the duration of the COVID-19 declaration under Section 564(b)(1) of the Act, 21 U.S.C. section  360bbb-3(b)(1), unless the authorization is terminated or revoked.  Performed at [facility], [address] [place], [place] [address]   MRSA PCR Screening     Status: None   Collection Time: 12/11/20  1:23 AM   Specimen: Nasopharyngeal  Result Value Ref Range Status   MRSA by PCR NEGATIVE NEGATIVE Final    Comment:        The GeneXpert MRSA Assay (FDA approved for NASAL specimens only), is one component of a comprehensive MRSA colonization surveillance program. It is not intended to diagnose MRSA infection nor to guide or monitor treatment for MRSA infections. Performed at [facility], [address], [place], [place] [address]     Radiology Reports [place]  Wo Contrast  Result Date: 12/10/2020 CLINICAL DATA:  Syncope EXAM: CT HEAD WITHOUT CONTRAST TECHNIQUE: Contiguous axial images were obtained from the base of the skull through the vertex without intravenous contrast. COMPARISON:  03/07/2018 FINDINGS: Brain: No acute intracranial abnormality. Specifically, no hemorrhage, hydrocephalus, mass lesion, acute infarction, or significant intracranial injury. Vascular: No hyperdense vessel or unexpected calcification. Skull: No acute calvarial abnormality. Sinuses/Orbits: No acute findings Other: None IMPRESSION: No acute intracranial abnormality. Electronically Signed   By: Rolm Baptise M.D.   On: 12/10/2020 18:30   DG Chest Port 1 View  Result Date: 12/10/2020 CLINICAL DATA:  Cough EXAM: PORTABLE CHEST 1 VIEW COMPARISON:  08/01/2017 FINDINGS: Cardiac shadow is within normal limits. Aortic calcifications are seen. Postsurgical changes are again noted. The lungs are clear. No bony abnormality is seen. IMPRESSION: No active disease. Electronically Signed   By: Inez Catalina M.D.   On: 12/10/2020 20:03   MM 3D SCREEN BREAST BILATERAL  Result Date: 12/11/2020 CLINICAL DATA:  Screening. EXAM: Bilateral screening digital craniocaudal and mediolateral oblique mammograms were attempted. The  images were evaluated with computer-aided detection. COMPARISON:  Previous exam(s). ACR Breast Density Category b: There are scattered areas of fibroglandular density. FINDINGS: Two craniocaudal views of the right breast only were obtained. Following this, the patient became dizzy and was unable to complete the study. Therefore, the remainder of the exam needs to be performed. We have attempted to contact the patient to return for these views, but at this time the patient has not returned to complete the exam. IMPRESSION: Completion of the screening exam is recommended. RECOMMENDATION: Additional mammographic views. BI-RADS CATEGORY  0: Incomplete. Need additional imaging evaluation and/or prior mammograms for comparison. Electronically Signed   By: Kristopher Oppenheim M.D.   On: 12/11/2020 08:46   ECHOCARDIOGRAM COMPLETE  Result Date: 12/11/2020    ECHOCARDIOGRAM REPORT   Patient Name:   [name] Date of Exam: 12/11/2020 Medical Rec #:  [id number]     Height:       64.0 in Accession #:    [id number]    Weight:       125.9 lb Date of Birth:  [birth year]     BSA:          1.607 m Patient Age:    54 years      BP:           178/90 mmHg Patient Gender: F             HR:           89 bpm. Exam Location:  [facility] Procedure: 2D Echo Indications:    Syncope R55  History:        Patient has prior history of Echocardiogram examinations, most                 recent 11/21/2016. Signs/Symptoms:Syncope; Risk Factors:Diabetes,                 Hypertension, Dyslipidemia and Current Smoker. Peripheral                 Vascular Disease.  Sonographer:    Leavy Cella RDCS (AE) Referring Phys: [id number] OLADAPO ADEFESO IMPRESSIONS  1. Global hypokineisis with more prominent hypokinesis of the anteroseptal, anterior, anterolateral walls. . Left ventricular ejection fraction, by estimation, is 35%. The left ventricle has moderately decreased function. The left ventricle demonstrates  regional wall motion abnormalities (see scoring  diagram/findings for description). Left ventricular diastolic parameters are consistent  with Grade III diastolic dysfunction (restrictive). Elevated left atrial pressure.  2. Right ventricular systolic function is normal. The right ventricular size is normal.  3. Left atrial size was mildly dilated.  4. The mitral valve is normal in structure. No evidence of mitral valve regurgitation. No evidence of mitral stenosis.  5. The aortic valve is tricuspid. Aortic valve regurgitation is not visualized. No aortic stenosis is present.  6. The pulmonic valve was abnormal.  7. Image 20 short axis view shows linear semi mobile density across the pulmonic valve, possible vegetation. Consider TEE.  8. The inferior vena cava is normal in size with greater than 50% respiratory variability, suggesting right atrial pressure of 3 mmHg. FINDINGS  Left Ventricle: Global hypokineisis with more prominent hypokinesis of the anteroseptal, anterior, anterolateral walls. Left ventricular ejection fraction, by estimation, is 35%. The left ventricle has moderately decreased function. The left ventricle demonstrates regional wall motion abnormalities. The left ventricular internal cavity size was normal in size. There is no left ventricular hypertrophy. Left ventricular diastolic parameters are consistent with Grade III diastolic dysfunction (restrictive). Elevated left atrial pressure. Right Ventricle: The right ventricular size is normal. Right vetricular wall thickness was not well visualized. Right ventricular systolic function is normal. Left Atrium: Left atrial size was mildly dilated. Right Atrium: Right atrial size was normal in size. Pericardium: There is no evidence of pericardial effusion. Mitral Valve: The mitral valve is normal in structure. No evidence of mitral valve regurgitation. No evidence of mitral valve stenosis. Tricuspid Valve: The tricuspid valve is normal in structure. Tricuspid valve regurgitation is trivial. No  evidence of tricuspid stenosis. Aortic Valve: The aortic valve is tricuspid. Aortic valve regurgitation is not visualized. No aortic stenosis is present. Aortic valve mean gradient measures 2.3 mmHg. Aortic valve peak gradient measures 4.2 mmHg. Aortic valve area, by VTI measures 2.14 cm. Pulmonic Valve: The pulmonic valve was abnormal. Pulmonic valve regurgitation is trivial. No evidence of pulmonic stenosis. Aorta: The aortic root is normal in size and structure. Pulmonary Artery: Image 20 short axis view shows linear semi mobile density across the pulmonic valve, possible vegetation. Consider TEE. Venous: The inferior vena cava is normal in size with greater than 50% respiratory variability, suggesting right atrial pressure of 3 mmHg. IAS/Shunts: No atrial level shunt detected by color flow Doppler.  LEFT VENTRICLE PLAX 2D LVIDd:         5.37 cm  Diastology LVIDs:         5.13 cm  LV e' medial:    3.50 cm/s LV PW:         0.82 cm  LV E/e' medial:  31.1 LV IVS:        0.89 cm  LV e' lateral:   4.91 cm/s LVOT diam:     2.00 cm  LV E/e' lateral: 22.2 LV SV:         47 LV SV Index:   29 LVOT Area:     3.14 cm  RIGHT VENTRICLE RV S prime:     8.67 cm/s TAPSE (M-mode): 1.5 cm LEFT ATRIUM             Index       RIGHT ATRIUM          Index LA diam:        3.60 cm 2.24 cm/m  RA Area:     8.19 cm LA Vol (A2C):   72.8 ml 45.30 ml/m RA Volume:   13.40 ml 8.34 ml/m LA  Vol (A4C):   21.5 ml 13.38 ml/m LA Biplane Vol: 43.0 ml 26.76 ml/m  AORTIC VALVE AV Area (Vmax):    2.41 cm AV Area (Vmean):   2.20 cm AV Area (VTI):     2.14 cm AV Vmax:           102.07 cm/s AV Vmean:          72.147 cm/s AV VTI:            0.221 m AV Peak Grad:      4.2 mmHg AV Mean Grad:      2.3 mmHg LVOT Vmax:         78.22 cm/s LVOT Vmean:        50.415 cm/s LVOT VTI:          0.151 m LVOT/AV VTI ratio: 0.68  AORTA Ao Root diam: 2.40 cm MITRAL VALVE MV Area (PHT): 4.89 cm     SHUNTS MV Decel Time: 155 msec     Systemic VTI:  0.15 m MV E  velocity: 109.00 cm/s  Systemic Diam: 2.00 cm MV A velocity: 93.00 cm/s MV E/A ratio:  1.17 Carlyle Dolly MD Electronically signed by Carlyle Dolly MD Signature Date/Time: 12/11/2020/10:13:58 AM    Final      CBC Recent Labs  Lab 12/10/20 1815 12/11/20 0454  WBC 27.6* 25.5*  HGB 11.7* 11.2*  HCT 37.5 32.7*  PLT 364 358  MCV 93.8 86.1  MCH 29.3 29.5  MCHC 31.2 34.3  RDW 13.6 13.6  LYMPHSABS 1.0  --   MONOABS 1.4*  --   EOSABS 1.6*  --   BASOSABS 0.1  --     Chemistries  Recent Labs  Lab 12/10/20 1815 12/11/20 0026 12/11/20 0454 12/11/20 0656  NA 123* 133* 131* 132*  K 4.6 3.0* 3.1* 3.2*  CL 87* 101 100 101  CO2 13* 17* 22 23  GLUCOSE 617* 269* 161* 142*  BUN 43* 36* 33* 33*  CREATININE 1.94* 1.54* 1.27* 1.30*  CALCIUM 7.9* 8.2* 7.9* 8.0*  MG  --   --  1.8  --   AST 18  --  39  --   ALT 13  --  13  --   ALKPHOS 60  --  50  --   BILITOT 2.1*  --  0.7  --    ------------------------------------------------------------------------------------------------------------------ No results for input(s): CHOL, HDL, LDLCALC, TRIG, CHOLHDL, LDLDIRECT in the last 72 hours.  Lab Results  Component Value Date   HGBA1C 12.2 (H) 08/27/2020   ------------------------------------------------------------------------------------------------------------------ No results for input(s): TSH, T4TOTAL, T3FREE, THYROIDAB in the last 72 hours.  Invalid input(s): FREET3 ------------------------------------------------------------------------------------------------------------------ No results for input(s): VITAMINB12, FOLATE, FERRITIN, TIBC, IRON, RETICCTPCT in the last 72 hours.  Coagulation profile Recent Labs  Lab 12/11/20 0454  INR 1.2    No results for input(s): DDIMER in the last 72 hours.  Cardiac Enzymes No results for input(s): CKMB, TROPONINI, MYOGLOBIN in the last 168 hours.  Invalid input(s):  CK ------------------------------------------------------------------------------------------------------------------ No results found for: BNP   Roxan Hockey M.D on 12/11/2020 at 11:32 AM  Go to [email or web address] - for contact info  [facility] - Office  [phone]

## 2020-12-11 NOTE — Progress Notes (Signed)
ANTICOAGULATION CONSULT NOTE - Initial Consult  Pharmacy Consult for Lovenox Indication: chest pain/ACS  Allergies  Allergen Reactions  . Dapagliflozin     Other reaction(s): Other Syncope    Patient Measurements: Height: 5\' 4"  (162.6 cm) Weight: 57.1 kg (125 lb 14.1 oz) IBW/kg (Calculated) : 54.7  Vital Signs: Temp: 97.6 F (36.4 C) (06/03 0141) Temp Source: Oral (06/03 0141) BP: 151/101 (06/03 0100) Pulse Rate: 95 (06/03 0015)  Labs: Recent Labs    12/10/20 1815 12/11/20 0026  HGB 11.7*  --   HCT 37.5  --   PLT 364  --   CREATININE 1.94* 1.54*  TROPONINIHS  --  3,477*    Estimated Creatinine Clearance: 31 mL/min (A) (by C-G formula based on SCr of 1.54 mg/dL (H)).   Medical History: Past Medical History:  Diagnosis Date  . Adenomatous polyp of colon   . Anemia   . Arthritis   . Asthma   . Carotid artery disease ([place])   . Carpal tunnel syndrome   . Chronic renal insufficiency    Stage 3 Kidney disease  . Coronary artery disease   . Depression with anxiety   . Diabetes mellitus    type II  . Diverticulosis of colon (without mention of hemorrhage)   . GERD (gastroesophageal reflux disease)   . Heart murmur   . History of hiatal hernia   . Hyperlipidemia   . Hypertension   . Hypothyroidism   . PVD (peripheral vascular disease) ([facility])     Medications:  Medications Prior to Admission  Medication Sig Dispense Refill Last Dose  . albuterol (PROVENTIL HFA;VENTOLIN HFA) 108 (90 BASE) MCG/ACT inhaler Inhale 2 puffs into the lungs every 6 (six) hours as needed for wheezing or shortness of breath.   12/09/2020 at Unknown time  . ALPRAZolam (XANAX) 1 MG tablet Take 1 mg by mouth 3 (three) times daily as needed for anxiety.   12/10/2020 at Unknown time  . aspirin EC 81 MG tablet Take 81 mg by mouth at bedtime.   12/09/2020 at Unknown time  . atorvastatin (LIPITOR) 80 MG tablet Take 80 mg by mouth daily.    12/10/2020 at Unknown time  . calcitRIOL (ROCALTROL) 0.25 MCG  capsule Take 0.25 mcg by mouth every Monday, Wednesday, and Friday.   6 Past Week at Unknown time  . cetirizine (ZYRTEC) 10 MG tablet Take 10 mg by mouth daily.  6 12/10/2020 at Unknown time  . famotidine (PEPCID) 40 MG tablet TAKE 1 TABLET BY MOUTH EVERY DAY (Patient taking differently: Take 40 mg by mouth daily.) 90 tablet 1 12/10/2020 at Unknown time  . fenofibrate micronized (LOFIBRA) 200 MG capsule Take 200 mg by mouth daily.   12/09/2020 at Unknown time  . ferrous sulfate 325 (65 FE) MG tablet Take 325 mg by mouth 3 (three) times daily.  3 12/09/2020 at Unknown time  . Insulin Glargine (BASAGLAR KWIKPEN) 100 UNIT/ML SOPN Inject 20-60 Units into the skin 2 (two) times daily before a meal. Per sliding scale  3 Past Week at Unknown time  . levothyroxine (SYNTHROID) 112 MCG tablet Take 1 tablet by mouth daily.   12/09/2020 at Unknown time  . Omega-3 Fatty Acids (FISH OIL) 1000 MG CAPS Take 1,000 mg by mouth 2 (two) times daily.   Past Week at Unknown time  . PARoxetine (PAXIL) 40 MG tablet Take 60 mg by mouth at bedtime.    12/09/2020 at Unknown time  . promethazine (PHENERGAN) 25 MG tablet Take 25  mg by mouth daily as needed for nausea or vomiting.   6 12/10/2020 at Unknown time  . triamcinolone cream (KENALOG) 0.1 % Apply 1 application topically 2 (two) times daily.   12/09/2020 at Unknown time  . Vitamin D, Ergocalciferol, (DRISDOL) 50000 UNITS CAPS capsule Take 50,000 Units by mouth 2 (two) times a week. Takes on Mondays and Fridays  1 Past Week at Unknown time  . acetaminophen-codeine (TYLENOL #3) 300-30 MG tablet Take 1-2 tablets by mouth every 6 (six) hours as needed for moderate pain. (Patient not taking: Reported on 09/30/2020) 15 tablet 0   . amLODipine (NORVASC) 2.5 MG tablet Take 1 tablet (2.5 mg total) by mouth daily. (Patient not taking: No sig reported) 90 tablet 3 Not Taking at Unknown time  . carvedilol (COREG) 12.5 MG tablet TAKE 1 TABLET BY MOUTH TWICE A DAY (Patient not taking: No sig reported)  180 tablet 0 Not Taking at Unknown time  . clopidogrel (PLAVIX) 75 MG tablet Take 1 tablet (75 mg total) by mouth daily with breakfast. (Patient not taking: No sig reported) 30 tablet 2   . furosemide (LASIX) 40 MG tablet Take 40 mg by mouth daily.  (Patient not taking: Reported on 12/10/2020)   Not Taking at Unknown time  . HYDROcodone-acetaminophen (NORCO/VICODIN) 5-325 MG tablet Take 1 tablet by mouth every 8 (eight) hours as needed. (Patient not taking: No sig reported) 15 tablet 0   . tiZANidine (ZANAFLEX) 4 MG tablet Take 1 tablet (4 mg total) by mouth every 6 (six) hours as needed for muscle spasms. (Patient not taking: Reported on 12/10/2020) 30 tablet 0 Not Taking at Unknown time  . triamcinolone ointment (KENALOG) 0.5 % APPLY TO AFFECTED AREA TWICE A DAY (Patient not taking: No sig reported)   Not Taking at Unknown time    Assessment: 65 y.o. female with elevated troponin, possible ACS, for Lovenox  Goal of Therapy:  Full anticoagulation with Lovenox Monitor platelets by anticoagulation protocol: Yes   Plan:  Lovenox 55 mg SQ q12h  Caryl Pina 12/11/2020,2:00 AM

## 2020-12-12 DIAGNOSIS — I248 Other forms of acute ischemic heart disease: Secondary | ICD-10-CM

## 2020-12-12 LAB — COMPREHENSIVE METABOLIC PANEL
ALT: 14 U/L (ref 0–44)
AST: 40 U/L (ref 15–41)
Albumin: 2.1 g/dL — ABNORMAL LOW (ref 3.5–5.0)
Alkaline Phosphatase: 56 U/L (ref 38–126)
Anion gap: 7 (ref 5–15)
BUN: 23 mg/dL (ref 8–23)
CO2: 21 mmol/L — ABNORMAL LOW (ref 22–32)
Calcium: 7.8 mg/dL — ABNORMAL LOW (ref 8.9–10.3)
Chloride: 105 mmol/L (ref 98–111)
Creatinine, Ser: 1.27 mg/dL — ABNORMAL HIGH (ref 0.44–1.00)
GFR, Estimated: 47 mL/min — ABNORMAL LOW (ref >60)
Glucose, Bld: 141 mg/dL — ABNORMAL HIGH (ref 70–99)
Potassium: 3.6 mmol/L (ref 3.5–5.1)
Sodium: 133 mmol/L — ABNORMAL LOW (ref 135–145)
Total Bilirubin: 0.7 mg/dL (ref 0.3–1.2)
Total Protein: 4.8 g/dL — ABNORMAL LOW (ref 6.5–8.1)

## 2020-12-12 LAB — BASIC METABOLIC PANEL
Anion gap: 4 — ABNORMAL LOW (ref 5–15)
BUN: 21 mg/dL (ref 8–23)
CO2: 24 mmol/L (ref 22–32)
Calcium: 8.1 mg/dL — ABNORMAL LOW (ref 8.9–10.3)
Chloride: 107 mmol/L (ref 98–111)
Creatinine, Ser: 1.3 mg/dL — ABNORMAL HIGH (ref 0.44–1.00)
GFR, Estimated: 45 mL/min — ABNORMAL LOW (ref >60)
Glucose, Bld: 85 mg/dL (ref 70–99)
Potassium: 3.6 mmol/L (ref 3.5–5.1)
Sodium: 135 mmol/L (ref 135–145)

## 2020-12-12 LAB — GLUCOSE, CAPILLARY
Glucose-Capillary: 107 mg/dL — ABNORMAL HIGH (ref 70–99)
Glucose-Capillary: 122 mg/dL — ABNORMAL HIGH (ref 70–99)
Glucose-Capillary: 123 mg/dL — ABNORMAL HIGH (ref 70–99)
Glucose-Capillary: 133 mg/dL — ABNORMAL HIGH (ref 70–99)
Glucose-Capillary: 146 mg/dL — ABNORMAL HIGH (ref 70–99)
Glucose-Capillary: 147 mg/dL — ABNORMAL HIGH (ref 70–99)
Glucose-Capillary: 67 mg/dL — ABNORMAL LOW (ref 70–99)
Glucose-Capillary: 87 mg/dL (ref 70–99)

## 2020-12-12 LAB — CBC
HCT: 29.3 % — ABNORMAL LOW (ref 36.0–46.0)
Hemoglobin: 10.1 g/dL — ABNORMAL LOW (ref 12.0–15.0)
MCH: 28.8 pg (ref 26.0–34.0)
MCHC: 34.5 g/dL (ref 30.0–36.0)
MCV: 83.5 fL (ref 80.0–100.0)
Platelets: 276 10*3/uL (ref 150–400)
RBC: 3.51 MIL/uL — ABNORMAL LOW (ref 3.87–5.11)
RDW: 13.9 % (ref 11.5–15.5)
WBC: 18 10*3/uL — ABNORMAL HIGH (ref 4.0–10.5)
nRBC: 0 % (ref 0.0–0.2)

## 2020-12-12 LAB — LIPID PANEL
Cholesterol: 99 mg/dL (ref 0–200)
HDL: 35 mg/dL — ABNORMAL LOW (ref >40)
LDL Cholesterol: 43 mg/dL (ref 0–99)
Total CHOL/HDL Ratio: 2.8 RATIO
Triglycerides: 103 mg/dL (ref <150)
VLDL: 21 mg/dL (ref 0–40)

## 2020-12-12 MED ORDER — AMLODIPINE BESYLATE 2.5 MG PO TABS: 2.5000 mg | ORAL_TABLET | Freq: Every day | ORAL | 0 refills | Status: AC

## 2020-12-12 MED ORDER — INSULIN GLARGINE 100 UNIT/ML ~~LOC~~ SOLN
20.0000 [IU] | Freq: Two times a day (BID) | SUBCUTANEOUS | Status: DC
  Administered 2020-12-12 (×2): 20 [IU] via SUBCUTANEOUS
  Filled 2020-12-12 (×4): qty 0.2

## 2020-12-12 MED ORDER — INSULIN ASPART 100 UNIT/ML IJ SOLN
0.0000 [IU] | Freq: Three times a day (TID) | INTRAMUSCULAR | Status: DC
  Administered 2020-12-12: 2 [IU] via SUBCUTANEOUS

## 2020-12-12 MED ORDER — ALUM & MAG HYDROXIDE-SIMETH 200-200-20 MG/5ML PO SUSP
30.0000 mL | Freq: Once | ORAL | Status: AC
  Administered 2020-12-12: 30 mL via ORAL
  Filled 2020-12-12: qty 30

## 2020-12-12 MED ORDER — PANTOPRAZOLE SODIUM 40 MG PO TBEC: 40.0000 mg | DELAYED_RELEASE_TABLET | Freq: Every day | ORAL | 0 refills | Status: AC

## 2020-12-12 MED ORDER — CARVEDILOL 6.25 MG PO TABS: 6.2500 mg | ORAL_TABLET | Freq: Two times a day (BID) | ORAL | 0 refills | Status: AC

## 2020-12-12 NOTE — Progress Notes (Signed)
Progress Note  Patient Name: [name] Date of Encounter: 12/12/2020  [facility] Cardiologist: [name], MD   Subjective   Feeling ok.  Burning abdominal pain from recurrent emesis.  Her breathing feels a little shallow but otherwise she is well.   Inpatient Medications    Scheduled Meds: . aspirin EC  81 mg Oral Daily  . atorvastatin  80 mg Oral q1800  . carvedilol  3.125 mg Oral BID WC  . Chlorhexidine Gluconate Cloth  6 each Topical Daily  . enoxaparin (LOVENOX) injection  55 mg Subcutaneous BID  . famotidine  40 mg Oral Daily  . ferrous sulfate  325 mg Oral Q breakfast  . insulin aspart  0-15 Units Subcutaneous TID AC & HS  . insulin glargine  20 Units Subcutaneous BID  . levothyroxine  100 mcg Oral Q0600  . multivitamin with minerals  1 tablet Oral Daily  . pantoprazole (PROTONIX) IV  40 mg Intravenous Q24H  . sodium chloride flush  3 mL Intravenous Q12H   Continuous Infusions: . sodium chloride    . lactated ringers     PRN Meds: sodium chloride, acetaminophen, ALPRAZolam, dextrose, labetalol, morphine injection, ondansetron (ZOFRAN) IV, prochlorperazine, sodium chloride flush   Vital Signs    Vitals:   12/11/20 1900 12/12/20 0300 12/12/20 0706 12/12/20 0724  BP: (!) 167/72 (!) 166/86 (!) 145/81 (!) 145/81  Pulse: 76 87 87 84  Resp: 12 18 18    Temp: 98.6 F (37 C) 98.3 F (36.8 C) 99 F (37.2 C)   TempSrc: Oral Oral Oral   SpO2: 93% 91% 98%   Weight:      Height:        Intake/Output Summary (Last 24 hours) at 12/12/2020 1215 Last data filed at 12/12/2020 0450 Gross per 24 hour  Intake 4473.9 ml  Output --  Net 4473.9 ml   Last 3 Weights 12/11/2020 12/10/2020 10/22/2020  Weight (lbs) 125 lb 14.1 oz 120 lb 122 lb  Weight (kg) 57.1 kg 54.432 kg 55.339 kg  Some encounter information is confidential and restricted. Go to Review Flowsheets activity to see all data.      Telemetry    Sinus rhythm.  - Personally Reviewed  ECG    12/29/2011:  Sinus rhythm.  Rate 92 bpm.  IVCD.  LVH.  Prior septal infarct. - Personally Reviewed  Physical Exam   VS:  BP (!) 147/71 (BP Location: Right Arm)   Pulse 84   Temp 99.1 F (37.3 C) (Oral)   Resp 17   Ht 5\' 4"  (1.626 m)   Wt 57.1 kg   SpO2 96%   BMI 21.61 kg/m  , BMI Body mass index is 21.61 kg/m. GENERAL:  Chronically ill-appearing.  Frail.  HEENT: Pupils equal round and reactive, fundi not visualized, oral mucosa unremarkable NECK:  No jugular venous distention, waveform within normal limits, carotid upstroke brisk and symmetric, no bruits LUNGS:  Clear to auscultation bilaterally HEART:  RRR.  PMI not displaced or sustained,S1 and S2 within normal limits, no S3, no S4, no clicks, no rubs, no murmurs ABD:  Flat, positive bowel sounds normal in frequency in pitch, no bruits, no rebound, no guarding, no midline pulsatile mass, no hepatomegaly, no splenomegaly EXT:  no edema, no cyanosis no clubbing SKIN:  No rashes no nodules NEURO:  Cranial nerves II through XII grossly intact, motor grossly intact throughout PSYCH:  Cognitively intact, oriented to person place and time  Labs    High  Sensitivity Troponin:   Recent Labs  Lab 12/11/20 0026 12/11/20 0246 12/11/20 0454 12/11/20 0656  TROPONINIHS 3,477* 11,345* 20,778* 19,799*      Chemistry Recent Labs  Lab 12/10/20 1815 12/11/20 0026 12/11/20 0454 12/11/20 9371 12/12/20 0116 12/12/20 0628  NA 123*   < > 131* 132* 133* 135  K 4.6   < > 3.1* 3.2* 3.6 3.6  CL 87*   < > 100 101 105 107  CO2 13*   < > 22 23 21* 24  GLUCOSE 617*   < > 161* 142* 141* 85  BUN 43*   < > 33* 33* 23 21  CREATININE 1.94*   < > 1.27* 1.30* 1.27* 1.30*  CALCIUM 7.9*   < > 7.9* 8.0* 7.8* 8.1*  PROT 6.8  --  6.0*  --  4.8*  --   ALBUMIN 3.3*  --  2.9*  --  2.1*  --   AST 18  --  39  --  40  --   ALT 13  --  13  --  14  --   ALKPHOS 60  --  50  --  56  --   BILITOT 2.1*  --  0.7  --  0.7  --   GFRNONAA 28*   < > 47* 45* 47* 45*  ANIONGAP  23*   < > 9 8 7  4*   < > = values in this interval not displayed.     Hematology Recent Labs  Lab 12/10/20 1815 12/11/20 0454 12/12/20 0116  WBC 27.6* 25.5* 18.0*  RBC 4.00 3.80* 3.51*  HGB 11.7* 11.2* 10.1*  HCT 37.5 32.7* 29.3*  MCV 93.8 86.1 83.5  MCH 29.3 29.5 28.8  MCHC 31.2 34.3 34.5  RDW 13.6 13.6 13.9  PLT 364 358 276    BNPNo results for input(s): BNP, PROBNP in the last 168 hours.   DDimer No results for input(s): DDIMER in the last 168 hours.   Radiology    CT Head Wo Contrast  Result Date: 12/10/2020 CLINICAL DATA:  Syncope EXAM: CT HEAD WITHOUT CONTRAST TECHNIQUE: Contiguous axial images were obtained from the base of the skull through the vertex without intravenous contrast. COMPARISON:  03/07/2018 FINDINGS: Brain: No acute intracranial abnormality. Specifically, no hemorrhage, hydrocephalus, mass lesion, acute infarction, or significant intracranial injury. Vascular: No hyperdense vessel or unexpected calcification. Skull: No acute calvarial abnormality. Sinuses/Orbits: No acute findings Other: None IMPRESSION: No acute intracranial abnormality. Electronically Signed   By: Rolm Baptise M.D.   On: 12/10/2020 18:30   CARDIAC CATHETERIZATION  Result Date: 12/11/2020  Ost LM to Mid LM lesion is 100% stenosed.  Seq SVG- OM1 and OM2.  LIMA graft was visualized by angiography and is normal in caliber.  SVG graft was visualized by angiography and is normal in caliber.  Ost RCA to Dist RCA lesion is 70% stenosed.  1. Severe native vessel CAD with total occlusion of the left main and severe diffuse calcific stenosis of the RCA 2. S/P CABG with continued patency of the LIMA-LAD, sequential SVG-OM1 and OM2, and SVG-distal RCA 3. Normal LVEDP 4. Systemic hypertension Recommend: medical therapy   DG Chest Port 1 View  Result Date: 12/10/2020 CLINICAL DATA:  Cough EXAM: PORTABLE CHEST 1 VIEW COMPARISON:  08/01/2017 FINDINGS: Cardiac shadow is within normal limits. Aortic  calcifications are seen. Postsurgical changes are again noted. The lungs are clear. No bony abnormality is seen. IMPRESSION: No active disease. Electronically Signed   By: Elta Guadeloupe  Lukens M.D.   On: 12/10/2020 20:03   ECHOCARDIOGRAM COMPLETE  Result Date: 12/11/2020    ECHOCARDIOGRAM REPORT   Patient Name:   [name] Date of Exam: 12/11/2020 Medical Rec #:  [id number]     Height:       64.0 in Accession #:    [id number]    Weight:       125.9 lb Date of Birth:  04/12/1956     BSA:          1.607 m Patient Age:    65 years      BP:           178/90 mmHg Patient Gender: F             HR:           89 bpm. Exam Location:  [facility] Procedure: 2D Echo Indications:    Syncope R55  History:        Patient has prior history of Echocardiogram examinations, most                 recent 11/21/2016. Signs/Symptoms:Syncope; Risk Factors:Diabetes,                 Hypertension, Dyslipidemia and Current Smoker. Peripheral                 Vascular Disease.  Sonographer:    Leavy Cella RDCS (AE) Referring Phys: [id number] OLADAPO ADEFESO IMPRESSIONS  1. Global hypokineisis with more prominent hypokinesis of the anteroseptal, anterior, anterolateral walls. . Left ventricular ejection fraction, by estimation, is 35%. The left ventricle has moderately decreased function. The left ventricle demonstrates  regional wall motion abnormalities (see scoring diagram/findings for description). Left ventricular diastolic parameters are consistent with Grade III diastolic dysfunction (restrictive). Elevated left atrial pressure.  2. Right ventricular systolic function is normal. The right ventricular size is normal.  3. Left atrial size was mildly dilated.  4. The mitral valve is normal in structure. No evidence of mitral valve regurgitation. No evidence of mitral stenosis.  5. The aortic valve is tricuspid. Aortic valve regurgitation is not visualized. No aortic stenosis is present.  6. The pulmonic valve was abnormal.  7. Image 20 short axis  view shows linear semi mobile density across the pulmonic valve, possible vegetation. Consider TEE.  8. The inferior vena cava is normal in size with greater than 50% respiratory variability, suggesting right atrial pressure of 3 mmHg. FINDINGS  Left Ventricle: Global hypokineisis with more prominent hypokinesis of the anteroseptal, anterior, anterolateral walls. Left ventricular ejection fraction, by estimation, is 35%. The left ventricle has moderately decreased function. The left ventricle demonstrates regional wall motion abnormalities. The left ventricular internal cavity size was normal in size. There is no left ventricular hypertrophy. Left ventricular diastolic parameters are consistent with Grade III diastolic dysfunction (restrictive). Elevated left atrial pressure. Right Ventricle: The right ventricular size is normal. Right vetricular wall thickness was not well visualized. Right ventricular systolic function is normal. Left Atrium: Left atrial size was mildly dilated. Right Atrium: Right atrial size was normal in size. Pericardium: There is no evidence of pericardial effusion. Mitral Valve: The mitral valve is normal in structure. No evidence of mitral valve regurgitation. No evidence of mitral valve stenosis. Tricuspid Valve: The tricuspid valve is normal in structure. Tricuspid valve regurgitation is trivial. No evidence of tricuspid stenosis. Aortic Valve: The aortic valve is tricuspid. Aortic valve regurgitation is not visualized. No aortic stenosis is present. Aortic valve mean  gradient measures 2.3 mmHg. Aortic valve peak gradient measures 4.2 mmHg. Aortic valve area, by VTI measures 2.14 cm. Pulmonic Valve: The pulmonic valve was abnormal. Pulmonic valve regurgitation is trivial. No evidence of pulmonic stenosis. Aorta: The aortic root is normal in size and structure. Pulmonary Artery: Image 20 short axis view shows linear semi mobile density across the pulmonic valve, possible vegetation.  Consider TEE. Venous: The inferior vena cava is normal in size with greater than 50% respiratory variability, suggesting right atrial pressure of 3 mmHg. IAS/Shunts: No atrial level shunt detected by color flow Doppler.  LEFT VENTRICLE PLAX 2D LVIDd:         5.37 cm  Diastology LVIDs:         5.13 cm  LV e' medial:    3.50 cm/s LV PW:         0.82 cm  LV E/e' medial:  31.1 LV IVS:        0.89 cm  LV e' lateral:   4.91 cm/s LVOT diam:     2.00 cm  LV E/e' lateral: 22.2 LV SV:         47 LV SV Index:   29 LVOT Area:     3.14 cm  RIGHT VENTRICLE RV S prime:     8.67 cm/s TAPSE (M-mode): 1.5 cm LEFT ATRIUM             Index       RIGHT ATRIUM          Index LA diam:        3.60 cm 2.24 cm/m  RA Area:     8.19 cm LA Vol (A2C):   72.8 ml 45.30 ml/m RA Volume:   13.40 ml 8.34 ml/m LA Vol (A4C):   21.5 ml 13.38 ml/m LA Biplane Vol: 43.0 ml 26.76 ml/m  AORTIC VALVE AV Area (Vmax):    2.41 cm AV Area (Vmean):   2.20 cm AV Area (VTI):     2.14 cm AV Vmax:           102.07 cm/s AV Vmean:          72.147 cm/s AV VTI:            0.221 m AV Peak Grad:      4.2 mmHg AV Mean Grad:      2.3 mmHg LVOT Vmax:         78.22 cm/s LVOT Vmean:        50.415 cm/s LVOT VTI:          0.151 m LVOT/AV VTI ratio: 0.68  AORTA Ao Root diam: 2.40 cm MITRAL VALVE MV Area (PHT): 4.89 cm     SHUNTS MV Decel Time: 155 msec     Systemic VTI:  0.15 m MV E velocity: 109.00 cm/s  Systemic Diam: 2.00 cm MV A velocity: 93.00 cm/s MV E/A ratio:  1.17 Carlyle Dolly MD Electronically signed by Carlyle Dolly MD Signature Date/Time: 12/11/2020/10:13:58 AM    Final     Cardiac Studies   Echo 12/11/20: 1. Global hypokineisis with more prominent hypokinesis of the  anteroseptal, anterior, anterolateral walls. . Left ventricular ejection  fraction, by estimation, is 35%. The left ventricle has moderately  decreased function. The left ventricle demonstrates  regional wall motion abnormalities (see scoring diagram/findings for  description). Left  ventricular diastolic parameters are consistent with  Grade III diastolic dysfunction (restrictive). Elevated left atrial  pressure.  2. Right ventricular systolic function is normal. The  right ventricular  size is normal.  3. Left atrial size was mildly dilated.  4. The mitral valve is normal in structure. No evidence of mitral valve  regurgitation. No evidence of mitral stenosis.  5. The aortic valve is tricuspid. Aortic valve regurgitation is not  visualized. No aortic stenosis is present.  6. The pulmonic valve was abnormal.  7. Image 20 short axis view shows linear semi mobile density across the  pulmonic valve, possible vegetation. Consider TEE.  8. The inferior vena cava is normal in size with greater than 50%  respiratory variability, suggesting right atrial pressure of 3 mmHg.   LHC 12/11/20:  Ost LM to Mid LM lesion is 100% stenosed.  Seq SVG- OM1 and OM2.  LIMA graft was visualized by angiography and is normal in caliber.  SVG graft was visualized by angiography and is normal in caliber.  Ost RCA to Dist RCA lesion is 70% stenosed.   1. Severe native vessel CAD with total occlusion of the left main and severe diffuse calcific stenosis of the RCA 2. S/P CABG with continued patency of the LIMA-LAD, sequential SVG-OM1 and OM2, and SVG-distal RCA 3. Normal LVEDP 4. Systemic hypertension  Recommend: medical therapy   Patient Profile     65 y.o. female with CAD status post CABG in 2004, hypertension, hyperlipidemia, carotid stenosis, diabetes mellitus type 2 (uncontrolled) right Pham-pop bypass 02/2017, thrombolysis and angioplasty of right Pham-pop bypass 08/2020, stage III-IV CKD admitted with DKA, syncope, hypertensive emergency, and NSTEMI.  Assessment & Plan    #NSTEMI: #Hyperlipidemia: Occurred in the setting of chest pain, syncope, and DKA.  High-sensitivity troponin has been elevated to 20,000.  She underwent left heart cath yesterday and was found to have  severe native vessel disease with total occlusion of the left main and severe calcific stenosis of the RCA.  Her LIMA to the LAD and vein graft to OM1--> OM2 and SVG to the distal RCA were patent.  Ultimately, this was likely demand ischemia in the setting of acute illness from DKA and hypertensive emergency.  LDL 43.  Triglycerides are well-controlled.  Continue atorvastatin.  Clopidogrel was stopped as an outpatient for +FOBT.  Continue aspirin.  Ideally she would be on DAPT for a year.  Upper endoscopy was normal 07/2017.  Hgb downtrending so we won't restart it at this time.  #Syncope: Recurrent episodes since 08/2020 occurring several times each week.  No arrhythmias on telemetry. Plan for 30 day monitor at discharge.   #Hypertensive emergency: Blood pressure on admission was 223/99.  Blood pressure medications were recently stopped by nephrology due to hypotension.  She was started back on carvedilol.  Plans to resume amlodipine 2.5mg  bu it hasn't been started.  We will increase carvedilol to 6.25mg .   #DKA: Anion gap is closed.  She is transition off the insulin drip.  #PAD: Recent hospitalization for ischemic right foot.  She underwent thrombolysis and angioplasty of a right fem- pop bypass.  She also has carotid stenosis.  # Acute on chronic renal failure: Creatinine is now back to baseline from a peak of 1.94.   [facility] will sign off.   Medication Recommendations:  Increase carvedilol to 6.25mg  bid Other recommendations (labs, testing, etc):   Follow up as an outpatient:  We will arrange  For questions or updates, please contact [place] Please consult [email or web address] for contact info under        Signed, Skeet Latch, MD  12/12/2020, 12:15 PM

## 2020-12-12 NOTE — Discharge Summary (Signed)
Physician Discharge Summary  [name] NOB:[id number] DOB: [id number] DOA: 12/10/2020  PCP: Nicholes Rough, PA-C  Admit date: 12/10/2020 Discharge date: 12/12/2020  Admitted From: Home Disposition:  home  Recommendations for Outpatient Follow-up:  1. Follow up with PCP in 1-2 weeks 2. Follow up with Cardiology as scheduled 3. Consider GI referral and repeat CBC within one week  Discharge Condition:Stable CODE STATUS:Full Diet recommendation: diabetic, heart healtny   Brief/Interim Summary: 65 y.o.femalewith past medical history of CAD (s/p CABG in 2004), carotid artery stenosis, HTN, HLD,uncontrolledType 2 DM, severe PAD (s/p left iliac and femoral endarterectomy in 2018, right fem-pop in 02/2017, thrombolysis and angioplasty of right fem-pop bypass in 08/2020), and Stage IV CKD admitted on 12/10/2020 with concerns about DKA as well as hypertensive urgency and chest discomfort and syncope and found to have very elevated troponin levels  Discharge Diagnoses:  Principal Problem:   CAD (coronary artery disease)/NSTEMI Active Problems:   Hyperlipidemia   Essential hypertension   PVD (peripheral vascular disease) ([place])   GERD   DKA (diabetic ketoacidosis) ([place])   Syncope, vasovagal   Leukocytosis   Prolonged QT interval   Chronic kidney disease   Gangrenous toe (HCC)   Hypoalbuminemia   Dehydration   Hyperglycemia   High anion gap metabolic acidosis   Hypertensive urgency   Hypothyroidism   CHF (congestive heart failure), NYHA class III, chronic, combined/EF 35 % with Global Hypokinesis with Grade III- diastolic Dysfunction   Protein-calorie malnutrition, severe   Demand ischemia of myocardium (HCC)   NSTEMI (non-ST elevated myocardial infarction) ([place])  1)CAD/NSTEMI/recurrent syncopal episodes--history of prior CABG in 2004, -presented with chest discomfort, recurrent syncopal episodes and elevated troponin Troponin 3,477>>11,345 >>20,778 >>19,799  -EKG normal sinus rhythm  with mild LVH, no acute ST changes -Echo on 12/11/2020 with global hypokinesis EF of 35% and grade 3 diastolic dysfunction -Cardiology consulted and pt underwent heart cath 6/3. Findings of severe native CAD s/p CABG with continued patency of grafts, recommendation for medical therapy -Per Cardiology, recommendation for ASA, holding off on plavix given slight downtrending hgb, carvedilol to 6.52m bid, resume norvasc 2.549m 2)DKA--patient met DKA criteria on admission - -resolved with IVF and insulin gtt -continued on home dosed insulin, recommend close f/u with PCP  3)AKI----acute kidney injury on CKD stage -IV   -Worsening renal function due to dehydration in the setting of DKA creatinine on admission=1.94  ,  -Improved with hydration  4) hyponatremia/hypokalemia---normalized  5)HTN--presented with hypertensive urgency with systolic blood pressure over 220, responded to interventions -coreg to 6.2586mid with resumption of norvasc 2.5mg37mr Cardiology recs, prescribed  6)PVD/right gangrenous toes----- patient with extensive PAD  -s/pleft external iliac/common femoral and profunda endarterectomy with extended profundoplasty, ligation of the left superficial femoral artery and femoral to below the knee popliteal artery bypass with a vein graft in 11/2016,and more recently right femoral to below-knee popliteal artery bypass in August 2018 for critical limb ischemiaand right thrombolysis and angioplasty of distal anastomosis right femoropopliteal bypass (February 2022) - Followed by Dr. AridFletcher Anon Dr. FielOneida Alaran outpatient.  -Patient has been off Plavix due to concerns about GI bleed, continue statin and ASA  7) Anemia--- heme positive stools -Hgb remained overall stable, holding plavix per Cardiology -Recommend close outpatient followup, consider GI referral  8) hypothyroidism--- continue levothyroxine as tolerated  9) tobacco abuse--- smoking cessation strongly  advised  Discharge Instructions   Allergies as of 12/12/2020      Reactions   Dapagliflozin  Other reaction(s): Other Syncope      Medication List    STOP taking these medications   acetaminophen-codeine 300-30 MG tablet Commonly known as: TYLENOL #3   clopidogrel 75 MG tablet Commonly known as: PLAVIX   famotidine 40 MG tablet Commonly known as: PEPCID   furosemide 40 MG tablet Commonly known as: LASIX   HYDROcodone-acetaminophen 5-325 MG tablet Commonly known as: NORCO/VICODIN   tiZANidine 4 MG tablet Commonly known as: Zanaflex     TAKE these medications   albuterol 108 (90 Base) MCG/ACT inhaler Commonly known as: VENTOLIN HFA Inhale 2 puffs into the lungs every 6 (six) hours as needed for wheezing or shortness of breath.   ALPRAZolam 1 MG tablet Commonly known as: XANAX Take 1 mg by mouth 3 (three) times daily as needed for anxiety.   amLODipine 2.5 MG tablet Commonly known as: NORVASC Take 1 tablet (2.5 mg total) by mouth daily.   aspirin EC 81 MG tablet Take 81 mg by mouth at bedtime.   atorvastatin 80 MG tablet Commonly known as: LIPITOR Take 80 mg by mouth daily.   Basaglar KwikPen 100 UNIT/ML Inject 20-60 Units into the skin 2 (two) times daily before a meal. Per sliding scale   calcitRIOL 0.25 MCG capsule Commonly known as: ROCALTROL Take 0.25 mcg by mouth every Monday, Wednesday, and Friday.   carvedilol 6.25 MG tablet Commonly known as: Coreg Take 1 tablet (6.25 mg total) by mouth 2 (two) times daily with a meal. What changed:   medication strength  how much to take  when to take this   cetirizine 10 MG tablet Commonly known as: ZYRTEC Take 10 mg by mouth daily.   fenofibrate micronized 200 MG capsule Commonly known as: LOFIBRA Take 200 mg by mouth daily.   ferrous sulfate 325 (65 FE) MG tablet Take 325 mg by mouth 3 (three) times daily.   Fish Oil 1000 MG Caps Take 1,000 mg by mouth 2 (two) times daily.    levothyroxine 112 MCG tablet Commonly known as: SYNTHROID Take 1 tablet by mouth daily.   pantoprazole 40 MG tablet Commonly known as: Protonix Take 1 tablet (40 mg total) by mouth daily.   PARoxetine 40 MG tablet Commonly known as: PAXIL Take 60 mg by mouth at bedtime.   promethazine 25 MG tablet Commonly known as: PHENERGAN Take 25 mg by mouth daily as needed for nausea or vomiting.   triamcinolone cream 0.1 % Commonly known as: KENALOG Apply 1 application topically 2 (two) times daily. What changed: Another medication with the same name was removed. Continue taking this medication, and follow the directions you see here.   Vitamin D (Ergocalciferol) 1.25 MG (50000 UNIT) Caps capsule Commonly known as: DRISDOL Take 50,000 Units by mouth 2 (two) times a week. Takes on Mondays and Fridays       Follow-up Information    Almyra Deforest, [place] Follow up on 01/05/2021.   Specialties: Cardiology, Radiology Why: Cardiology Hospital Follow-up on 01/05/2021 at 2:15 PM with Almyra Deforest, PA-C (works with Dr. Fletcher Anon) Contact information: [address] [place] [address] [phone]        Nicholes Rough, PA-C. Schedule an appointment as soon as possible for a visit in 2 week(s).   Specialty: Physician Assistant Contact information: [address] [place] [address] [phone]        Wellington Hampshire, MD .   Specialty: Cardiology Contact information: [address] [place] [address] [phone]  Allergies  Allergen Reactions  . Dapagliflozin     Other reaction(s): Other Syncope    Consultations:  Cardiology  Procedures/Studies: CT Head Wo Contrast  Result Date: 12/10/2020 CLINICAL DATA:  Syncope EXAM: CT HEAD WITHOUT CONTRAST TECHNIQUE: Contiguous axial images were obtained from the base of the skull through the vertex without intravenous contrast. COMPARISON:  03/07/2018 FINDINGS: Brain: No acute intracranial  abnormality. Specifically, no hemorrhage, hydrocephalus, mass lesion, acute infarction, or significant intracranial injury. Vascular: No hyperdense vessel or unexpected calcification. Skull: No acute calvarial abnormality. Sinuses/Orbits: No acute findings Other: None IMPRESSION: No acute intracranial abnormality. Electronically Signed   By: Rolm Baptise M.D.   On: 12/10/2020 18:30   CARDIAC CATHETERIZATION  Result Date: 12/11/2020  Ost LM to Mid LM lesion is 100% stenosed.  Seq SVG- OM1 and OM2.  LIMA graft was visualized by angiography and is normal in caliber.  SVG graft was visualized by angiography and is normal in caliber.  Ost RCA to Dist RCA lesion is 70% stenosed.  1. Severe native vessel CAD with total occlusion of the left main and severe diffuse calcific stenosis of the RCA 2. S/P CABG with continued patency of the LIMA-LAD, sequential SVG-OM1 and OM2, and SVG-distal RCA 3. Normal LVEDP 4. Systemic hypertension Recommend: medical therapy   DG Chest Port 1 View  Result Date: 12/10/2020 CLINICAL DATA:  Cough EXAM: PORTABLE CHEST 1 VIEW COMPARISON:  08/01/2017 FINDINGS: Cardiac shadow is within normal limits. Aortic calcifications are seen. Postsurgical changes are again noted. The lungs are clear. No bony abnormality is seen. IMPRESSION: No active disease. Electronically Signed   By: Inez Catalina M.D.   On: 12/10/2020 20:03   MM 3D SCREEN BREAST BILATERAL  Result Date: 12/11/2020 CLINICAL DATA:  Screening. EXAM: Bilateral screening digital craniocaudal and mediolateral oblique mammograms were attempted. The images were evaluated with computer-aided detection. COMPARISON:  Previous exam(s). ACR Breast Density Category b: There are scattered areas of fibroglandular density. FINDINGS: Two craniocaudal views of the right breast only were obtained. Following this, the patient became dizzy and was unable to complete the study. Therefore, the remainder of the exam needs to be performed. We have  attempted to contact the patient to return for these views, but at this time the patient has not returned to complete the exam. IMPRESSION: Completion of the screening exam is recommended. RECOMMENDATION: Additional mammographic views. BI-RADS CATEGORY  0: Incomplete. Need additional imaging evaluation and/or prior mammograms for comparison. Electronically Signed   By: Kristopher Oppenheim M.D.   On: 12/11/2020 08:46   ECHOCARDIOGRAM COMPLETE  Result Date: 12/11/2020    ECHOCARDIOGRAM REPORT   Patient Name:   [name] Date of Exam: 12/11/2020 Medical Rec #:  [id number]     Height:       64.0 in Accession #:    [id number]    Weight:       125.9 lb Date of Birth:  07/26/1955     BSA:          1.607 m Patient Age:    65 years      BP:           178/90 mmHg Patient Gender: F             HR:           89 bpm. Exam Location:  [facility] Procedure: 2D Echo Indications:    Syncope R55  History:        Patient has prior history of  Echocardiogram examinations, most                 recent 11/21/2016. Signs/Symptoms:Syncope; Risk Factors:Diabetes,                 Hypertension, Dyslipidemia and Current Smoker. Peripheral                 Vascular Disease.  Sonographer:    Leavy Cella RDCS (AE) Referring Phys: [id number] OLADAPO ADEFESO IMPRESSIONS  1. Global hypokineisis with more prominent hypokinesis of the anteroseptal, anterior, anterolateral walls. . Left ventricular ejection fraction, by estimation, is 35%. The left ventricle has moderately decreased function. The left ventricle demonstrates  regional wall motion abnormalities (see scoring diagram/findings for description). Left ventricular diastolic parameters are consistent with Grade III diastolic dysfunction (restrictive). Elevated left atrial pressure.  2. Right ventricular systolic function is normal. The right ventricular size is normal.  3. Left atrial size was mildly dilated.  4. The mitral valve is normal in structure. No evidence of mitral valve regurgitation. No  evidence of mitral stenosis.  5. The aortic valve is tricuspid. Aortic valve regurgitation is not visualized. No aortic stenosis is present.  6. The pulmonic valve was abnormal.  7. Image 20 short axis view shows linear semi mobile density across the pulmonic valve, possible vegetation. Consider TEE.  8. The inferior vena cava is normal in size with greater than 50% respiratory variability, suggesting right atrial pressure of 3 mmHg. FINDINGS  Left Ventricle: Global hypokineisis with more prominent hypokinesis of the anteroseptal, anterior, anterolateral walls. Left ventricular ejection fraction, by estimation, is 35%. The left ventricle has moderately decreased function. The left ventricle demonstrates regional wall motion abnormalities. The left ventricular internal cavity size was normal in size. There is no left ventricular hypertrophy. Left ventricular diastolic parameters are consistent with Grade III diastolic dysfunction (restrictive). Elevated left atrial pressure. Right Ventricle: The right ventricular size is normal. Right vetricular wall thickness was not well visualized. Right ventricular systolic function is normal. Left Atrium: Left atrial size was mildly dilated. Right Atrium: Right atrial size was normal in size. Pericardium: There is no evidence of pericardial effusion. Mitral Valve: The mitral valve is normal in structure. No evidence of mitral valve regurgitation. No evidence of mitral valve stenosis. Tricuspid Valve: The tricuspid valve is normal in structure. Tricuspid valve regurgitation is trivial. No evidence of tricuspid stenosis. Aortic Valve: The aortic valve is tricuspid. Aortic valve regurgitation is not visualized. No aortic stenosis is present. Aortic valve mean gradient measures 2.3 mmHg. Aortic valve peak gradient measures 4.2 mmHg. Aortic valve area, by VTI measures 2.14 cm. Pulmonic Valve: The pulmonic valve was abnormal. Pulmonic valve regurgitation is trivial. No evidence of  pulmonic stenosis. Aorta: The aortic root is normal in size and structure. Pulmonary Artery: Image 20 short axis view shows linear semi mobile density across the pulmonic valve, possible vegetation. Consider TEE. Venous: The inferior vena cava is normal in size with greater than 50% respiratory variability, suggesting right atrial pressure of 3 mmHg. IAS/Shunts: No atrial level shunt detected by color flow Doppler.  LEFT VENTRICLE PLAX 2D LVIDd:         5.37 cm  Diastology LVIDs:         5.13 cm  LV e' medial:    3.50 cm/s LV PW:         0.82 cm  LV E/e' medial:  31.1 LV IVS:        0.89 cm  LV e' lateral:  4.91 cm/s LVOT diam:     2.00 cm  LV E/e' lateral: 22.2 LV SV:         47 LV SV Index:   29 LVOT Area:     3.14 cm  RIGHT VENTRICLE RV S prime:     8.67 cm/s TAPSE (M-mode): 1.5 cm LEFT ATRIUM             Index       RIGHT ATRIUM          Index LA diam:        3.60 cm 2.24 cm/m  RA Area:     8.19 cm LA Vol (A2C):   72.8 ml 45.30 ml/m RA Volume:   13.40 ml 8.34 ml/m LA Vol (A4C):   21.5 ml 13.38 ml/m LA Biplane Vol: 43.0 ml 26.76 ml/m  AORTIC VALVE AV Area (Vmax):    2.41 cm AV Area (Vmean):   2.20 cm AV Area (VTI):     2.14 cm AV Vmax:           102.07 cm/s AV Vmean:          72.147 cm/s AV VTI:            0.221 m AV Peak Grad:      4.2 mmHg AV Mean Grad:      2.3 mmHg LVOT Vmax:         78.22 cm/s LVOT Vmean:        50.415 cm/s LVOT VTI:          0.151 m LVOT/AV VTI ratio: 0.68  AORTA Ao Root diam: 2.40 cm MITRAL VALVE MV Area (PHT): 4.89 cm     SHUNTS MV Decel Time: 155 msec     Systemic VTI:  0.15 m MV E velocity: 109.00 cm/s  Systemic Diam: 2.00 cm MV A velocity: 93.00 cm/s MV E/A ratio:  1.17 Carlyle Dolly MD Electronically signed by Carlyle Dolly MD Signature Date/Time: 12/11/2020/10:13:58 AM    Final      Subjective: Eager to go home  Discharge Exam: Vitals:   12/12/20 0724 12/12/20 1225  BP: (!) 145/81 (!) 147/71  Pulse: 84 84  Resp:  17  Temp:  99.1 F (37.3 C)  SpO2:  96%    Vitals:   12/12/20 0300 12/12/20 0706 12/12/20 0724 12/12/20 1225  BP: (!) 166/86 (!) 145/81 (!) 145/81 (!) 147/71  Pulse: 87 87 84 84  Resp: '18 18  17  ' Temp: 98.3 F (36.8 C) 99 F (37.2 C)  99.1 F (37.3 C)  TempSrc: Oral Oral  Oral  SpO2: 91% 98%  96%  Weight:      Height:        General: Pt is alert, awake, not in acute distress Cardiovascular: RRR, S1/S2 + Respiratory: CTA bilaterally, no wheezing, no rhonchi Abdominal: Soft, NT, ND, bowel sounds + Extremities: no edema, no cyanosis   The results of significant diagnostics from this hospitalization (including imaging, microbiology, ancillary and laboratory) are listed below for reference.     Microbiology: Recent Results (from the past 240 hour(s))  Resp Panel by RT-PCR (Flu A&B, Covid) Nasopharyngeal Swab     Status: None   Collection Time: 12/10/20  8:59 PM   Specimen: Nasopharyngeal Swab; Nasopharyngeal(NP) swabs in vial transport medium  Result Value Ref Range Status   SARS Coronavirus 2 by RT PCR NEGATIVE NEGATIVE Final    Comment: (NOTE) SARS-CoV-2 target nucleic acids are NOT DETECTED.  The SARS-CoV-2 RNA is  generally detectable in upper respiratory specimens during the acute phase of infection. The lowest concentration of SARS-CoV-2 viral copies this assay can detect is 138 copies/mL. A negative result does not preclude SARS-Cov-2 infection and should not be used as the sole basis for treatment or other patient management decisions. A negative result may occur with  improper specimen collection/handling, submission of specimen other than nasopharyngeal swab, presence of viral mutation(s) within the areas targeted by this assay, and inadequate number of viral copies(<138 copies/mL). A negative result must be combined with clinical observations, patient history, and epidemiological information. The expected result is Negative.  Fact Sheet for Patients:  EntrepreneurPulse.com.au  Fact  Sheet for Healthcare Providers:  IncredibleEmployment.be  This test is no t yet approved or cleared by the [place] FDA and  has been authorized for detection and/or diagnosis of SARS-CoV-2 by FDA under an Emergency Use Authorization (EUA). This EUA will remain  in effect (meaning this test can be used) for the duration of the COVID-19 declaration under Section 564(b)(1) of the Act, 21 U.S.C.section 360bbb-3(b)(1), unless the authorization is terminated  or revoked sooner.       Influenza A by PCR NEGATIVE NEGATIVE Final   Influenza B by PCR NEGATIVE NEGATIVE Final    Comment: (NOTE) The Xpert Xpress SARS-CoV-2/FLU/RSV plus assay is intended as an aid in the diagnosis of influenza from Nasopharyngeal swab specimens and should not be used as a sole basis for treatment. Nasal washings and aspirates are unacceptable for Xpert Xpress SARS-CoV-2/FLU/RSV testing.  Fact Sheet for Patients: EntrepreneurPulse.com.au  Fact Sheet for Healthcare Providers: IncredibleEmployment.be  This test is not yet approved or cleared by the [place] FDA and has been authorized for detection and/or diagnosis of SARS-CoV-2 by FDA under an Emergency Use Authorization (EUA). This EUA will remain in effect (meaning this test can be used) for the duration of the COVID-19 declaration under Section 564(b)(1) of the Act, 21 U.S.C. section 360bbb-3(b)(1), unless the authorization is terminated or revoked.  Performed at [facility], [address] [place], [place] [address]   MRSA PCR Screening     Status: None   Collection Time: 12/11/20  1:23 AM   Specimen: Nasopharyngeal  Result Value Ref Range Status   MRSA by PCR NEGATIVE NEGATIVE Final    Comment:        The GeneXpert MRSA Assay (FDA approved for NASAL specimens only), is one component of a comprehensive MRSA colonization surveillance program. It is not intended to diagnose  MRSA infection nor to guide or monitor treatment for MRSA infections. Performed at [facility], [address] [place], [place] [address]   MRSA PCR Screening     Status: None   Collection Time: 12/11/20 10:08 AM   Specimen: Nasal Mucosa; Nasopharyngeal  Result Value Ref Range Status   MRSA by PCR NEGATIVE NEGATIVE Final    Comment:        The GeneXpert MRSA Assay (FDA approved for NASAL specimens only), is one component of a comprehensive MRSA colonization surveillance program. It is not intended to diagnose MRSA infection nor to guide or monitor treatment for MRSA infections. Performed at [facility], [place] [address], [place], [place] [address]      Labs: BNP (last 3 results) No results for input(s): BNP in the last 8760 hours. Basic Metabolic Panel: Recent Labs  Lab 12/11/20 0026 12/11/20 0454 12/11/20 0656 12/12/20 0116 12/12/20 0628  NA 133* 131* 132* 133* 135  K 3.0* 3.1* 3.2* 3.6  3.6  CL 101 100 101 105 107  CO2 17* 22 23 21* 24  GLUCOSE 269* 161* 142* 141* 85  BUN 36* 33* 33* 23 21  CREATININE 1.54* 1.27* 1.30* 1.27* 1.30*  CALCIUM 8.2* 7.9* 8.0* 7.8* 8.1*  MG  --  1.8  --   --   --   PHOS  --  1.8*  --   --   --    Liver Function Tests: Recent Labs  Lab 12/10/20 1815 12/11/20 0454 12/12/20 0116  AST 18 39 40  ALT '13 13 14  ' ALKPHOS 60 50 56  BILITOT 2.1* 0.7 0.7  PROT 6.8 6.0* 4.8*  ALBUMIN 3.3* 2.9* 2.1*   No results for input(s): LIPASE, AMYLASE in the last 168 hours. No results for input(s): AMMONIA in the last 168 hours. CBC: Recent Labs  Lab 12/10/20 1815 12/11/20 0454 12/12/20 0116  WBC 27.6* 25.5* 18.0*  NEUTROABS 23.4*  --   --   HGB 11.7* 11.2* 10.1*  HCT 37.5 32.7* 29.3*  MCV 93.8 86.1 83.5  PLT 364 358 276   Cardiac Enzymes: No results for input(s): CKTOTAL, CKMB, CKMBINDEX, TROPONINI in the last 168 hours. BNP: Invalid input(s): POCBNP CBG: Recent Labs  Lab 12/12/20 0232 12/12/20 0447 12/12/20 0719  12/12/20 0926 12/12/20 1135  GLUCAP 133* 147* 67* 122* 107*   D-Dimer No results for input(s): DDIMER in the last 72 hours. Hgb A1c Recent Labs    12/11/20 0026  HGBA1C 12.5*   Lipid Profile Recent Labs    12/12/20 0116  CHOL 99  HDL 35*  LDLCALC 43  TRIG 103  CHOLHDL 2.8   Thyroid function studies No results for input(s): TSH, T4TOTAL, T3FREE, THYROIDAB in the last 72 hours.  Invalid input(s): FREET3 Anemia work up No results for input(s): VITAMINB12, FOLATE, FERRITIN, TIBC, IRON, RETICCTPCT in the last 72 hours. Urinalysis    Component Value Date/Time   COLORURINE YELLOW 12/10/2020 2030   APPEARANCEUR HAZY (A) 12/10/2020 2030   LABSPEC 1.018 12/10/2020 2030   PHURINE 5.0 12/10/2020 2030   GLUCOSEU >=500 (A) 12/10/2020 2030   HGBUR SMALL (A) 12/10/2020 2030   [place] NEGATIVE 12/10/2020 2030   KETONESUR 20 (A) 12/10/2020 2030   PROTEINUR 100 (A) 12/10/2020 2030   NITRITE NEGATIVE 12/10/2020 2030   LEUKOCYTESUR NEGATIVE 12/10/2020 2030   Sepsis Labs Invalid input(s): PROCALCITONIN,  WBC,  LACTICIDVEN Microbiology Recent Results (from the past 240 hour(s))  Resp Panel by RT-PCR (Flu A&B, Covid) Nasopharyngeal Swab     Status: None   Collection Time: 12/10/20  8:59 PM   Specimen: Nasopharyngeal Swab; Nasopharyngeal(NP) swabs in vial transport medium  Result Value Ref Range Status   SARS Coronavirus 2 by RT PCR NEGATIVE NEGATIVE Final    Comment: (NOTE) SARS-CoV-2 target nucleic acids are NOT DETECTED.  The SARS-CoV-2 RNA is generally detectable in upper respiratory specimens during the acute phase of infection. The lowest concentration of SARS-CoV-2 viral copies this assay can detect is 138 copies/mL. A negative result does not preclude SARS-Cov-2 infection and should not be used as the sole basis for treatment or other patient management decisions. A negative result may occur with  improper specimen collection/handling, submission of specimen  other than nasopharyngeal swab, presence of viral mutation(s) within the areas targeted by this assay, and inadequate number of viral copies(<138 copies/mL). A negative result must be combined with clinical observations, patient history, and epidemiological information. The expected result is Negative.  Fact Sheet for Patients:  EntrepreneurPulse.com.au  Fact Sheet for Healthcare Providers:  IncredibleEmployment.be  This test is no t yet approved or cleared by the [place] FDA and  has been authorized for detection and/or diagnosis of SARS-CoV-2 by FDA under an Emergency Use Authorization (EUA). This EUA will remain  in effect (meaning this test can be used) for the duration of the COVID-19 declaration under Section 564(b)(1) of the Act, 21 U.S.C.section 360bbb-3(b)(1), unless the authorization is terminated  or revoked sooner.       Influenza A by PCR NEGATIVE NEGATIVE Final   Influenza B by PCR NEGATIVE NEGATIVE Final    Comment: (NOTE) The Xpert Xpress SARS-CoV-2/FLU/RSV plus assay is intended as an aid in the diagnosis of influenza from Nasopharyngeal swab specimens and should not be used as a sole basis for treatment. Nasal washings and aspirates are unacceptable for Xpert Xpress SARS-CoV-2/FLU/RSV testing.  Fact Sheet for Patients: EntrepreneurPulse.com.au  Fact Sheet for Healthcare Providers: IncredibleEmployment.be  This test is not yet approved or cleared by the [place] FDA and has been authorized for detection and/or diagnosis of SARS-CoV-2 by FDA under an Emergency Use Authorization (EUA). This EUA will remain in effect (meaning this test can be used) for the duration of the COVID-19 declaration under Section 564(b)(1) of the Act, 21 U.S.C. section 360bbb-3(b)(1), unless the authorization is terminated or revoked.  Performed at [facility], [address], [place], [place]  [address]   MRSA PCR Screening     Status: None   Collection Time: 12/11/20  1:23 AM   Specimen: Nasopharyngeal  Result Value Ref Range Status   MRSA by PCR NEGATIVE NEGATIVE Final    Comment:        The GeneXpert MRSA Assay (FDA approved for NASAL specimens only), is one component of a comprehensive MRSA colonization surveillance program. It is not intended to diagnose MRSA infection nor to guide or monitor treatment for MRSA infections. Performed at [facility], [address] [place], [place] [address]   MRSA PCR Screening     Status: None   Collection Time: 12/11/20 10:08 AM   Specimen: Nasal Mucosa; Nasopharyngeal  Result Value Ref Range Status   MRSA by PCR NEGATIVE NEGATIVE Final    Comment:        The GeneXpert MRSA Assay (FDA approved for NASAL specimens only), is one component of a comprehensive MRSA colonization surveillance program. It is not intended to diagnose MRSA infection nor to guide or monitor treatment for MRSA infections. Performed at [facility], [place] [address] [place], [place] [address]    Time spent: 25mn  SIGNED:   SMarylu Lund MD  [facility] 12/12/2020, 3:31 PM  If 7PM-7AM, please contact night-coverage

## 2020-12-12 NOTE — Progress Notes (Signed)
HOSPITAL MEDICINE OVERNIGHT EVENT NOTE    Notified by nursing that patient's beta hydroxybutyrate has normalized and anion gap is closed since the early morning of 6/3.  We will transition patient from insulin infusion to subcutaneous insulin.  Hemoglobin A1c is over 12%.  Will place patient on Lantus 20 units subcutaneous twice daily.  We will discontinue insulin infusion 1 to 2 hours after initiation of Lantus.  Will initiate diabetic diet.  If patient continues to be hyperglycemic patient should be transitioned to a basal bolus insulin regimen.  Mercedes Emerald  MD [facility]

## 2020-12-14 ENCOUNTER — Other Ambulatory Visit: Payer: Self-pay | Admitting: Physician Assistant

## 2020-12-14 ENCOUNTER — Encounter (HOSPITAL_COMMUNITY): Payer: Self-pay | Admitting: Cardiovascular Disease

## 2020-12-16 ENCOUNTER — Emergency Department (HOSPITAL_COMMUNITY): Payer: Medicare Other

## 2020-12-16 ENCOUNTER — Other Ambulatory Visit: Payer: Self-pay

## 2020-12-16 ENCOUNTER — Inpatient Hospital Stay (HOSPITAL_COMMUNITY)
Admission: EM | Admit: 2020-12-16 | Discharge: 2021-01-08 | DRG: 871 | Disposition: E | Payer: Medicare Other | Attending: Internal Medicine | Admitting: Internal Medicine

## 2020-12-16 DIAGNOSIS — E1122 Type 2 diabetes mellitus with diabetic chronic kidney disease: Secondary | ICD-10-CM | POA: Diagnosis present

## 2020-12-16 DIAGNOSIS — I129 Hypertensive chronic kidney disease with stage 1 through stage 4 chronic kidney disease, or unspecified chronic kidney disease: Secondary | ICD-10-CM | POA: Diagnosis present

## 2020-12-16 DIAGNOSIS — N184 Chronic kidney disease, stage 4 (severe): Secondary | ICD-10-CM | POA: Diagnosis present

## 2020-12-16 DIAGNOSIS — E1152 Type 2 diabetes mellitus with diabetic peripheral angiopathy with gangrene: Secondary | ICD-10-CM | POA: Diagnosis present

## 2020-12-16 DIAGNOSIS — Z90710 Acquired absence of both cervix and uterus: Secondary | ICD-10-CM

## 2020-12-16 DIAGNOSIS — I509 Heart failure, unspecified: Secondary | ICD-10-CM | POA: Diagnosis not present

## 2020-12-16 DIAGNOSIS — J45909 Unspecified asthma, uncomplicated: Secondary | ICD-10-CM | POA: Diagnosis present

## 2020-12-16 DIAGNOSIS — N179 Acute kidney failure, unspecified: Secondary | ICD-10-CM | POA: Diagnosis present

## 2020-12-16 DIAGNOSIS — Z833 Family history of diabetes mellitus: Secondary | ICD-10-CM

## 2020-12-16 DIAGNOSIS — R571 Hypovolemic shock: Secondary | ICD-10-CM | POA: Diagnosis present

## 2020-12-16 DIAGNOSIS — Z20822 Contact with and (suspected) exposure to covid-19: Secondary | ICD-10-CM | POA: Diagnosis present

## 2020-12-16 DIAGNOSIS — F32A Depression, unspecified: Secondary | ICD-10-CM | POA: Diagnosis present

## 2020-12-16 DIAGNOSIS — I214 Non-ST elevation (NSTEMI) myocardial infarction: Secondary | ICD-10-CM | POA: Diagnosis present

## 2020-12-16 DIAGNOSIS — G9341 Metabolic encephalopathy: Secondary | ICD-10-CM | POA: Diagnosis present

## 2020-12-16 DIAGNOSIS — A419 Sepsis, unspecified organism: Principal | ICD-10-CM

## 2020-12-16 DIAGNOSIS — Z8249 Family history of ischemic heart disease and other diseases of the circulatory system: Secondary | ICD-10-CM | POA: Diagnosis not present

## 2020-12-16 DIAGNOSIS — R627 Adult failure to thrive: Secondary | ICD-10-CM | POA: Diagnosis present

## 2020-12-16 DIAGNOSIS — I2510 Atherosclerotic heart disease of native coronary artery without angina pectoris: Secondary | ICD-10-CM | POA: Diagnosis present

## 2020-12-16 DIAGNOSIS — E872 Acidosis: Secondary | ICD-10-CM | POA: Diagnosis present

## 2020-12-16 DIAGNOSIS — E46 Unspecified protein-calorie malnutrition: Secondary | ICD-10-CM | POA: Diagnosis present

## 2020-12-16 DIAGNOSIS — R5383 Other fatigue: Secondary | ICD-10-CM | POA: Diagnosis present

## 2020-12-16 DIAGNOSIS — E785 Hyperlipidemia, unspecified: Secondary | ICD-10-CM | POA: Diagnosis present

## 2020-12-16 DIAGNOSIS — Z79890 Hormone replacement therapy (postmenopausal): Secondary | ICD-10-CM

## 2020-12-16 DIAGNOSIS — K56609 Unspecified intestinal obstruction, unspecified as to partial versus complete obstruction: Secondary | ICD-10-CM

## 2020-12-16 DIAGNOSIS — I252 Old myocardial infarction: Secondary | ICD-10-CM | POA: Diagnosis not present

## 2020-12-16 DIAGNOSIS — Z66 Do not resuscitate: Secondary | ICD-10-CM | POA: Diagnosis present

## 2020-12-16 DIAGNOSIS — Z7982 Long term (current) use of aspirin: Secondary | ICD-10-CM

## 2020-12-16 DIAGNOSIS — Z951 Presence of aortocoronary bypass graft: Secondary | ICD-10-CM

## 2020-12-16 DIAGNOSIS — F419 Anxiety disorder, unspecified: Secondary | ICD-10-CM | POA: Diagnosis present

## 2020-12-16 DIAGNOSIS — I959 Hypotension, unspecified: Secondary | ICD-10-CM

## 2020-12-16 DIAGNOSIS — E039 Hypothyroidism, unspecified: Secondary | ICD-10-CM | POA: Diagnosis present

## 2020-12-16 DIAGNOSIS — D649 Anemia, unspecified: Secondary | ICD-10-CM

## 2020-12-16 DIAGNOSIS — Z794 Long term (current) use of insulin: Secondary | ICD-10-CM

## 2020-12-16 DIAGNOSIS — K219 Gastro-esophageal reflux disease without esophagitis: Secondary | ICD-10-CM | POA: Diagnosis present

## 2020-12-16 DIAGNOSIS — Z79899 Other long term (current) drug therapy: Secondary | ICD-10-CM

## 2020-12-16 DIAGNOSIS — F17210 Nicotine dependence, cigarettes, uncomplicated: Secondary | ICD-10-CM | POA: Diagnosis present

## 2020-12-16 DIAGNOSIS — Z6821 Body mass index (BMI) 21.0-21.9, adult: Secondary | ICD-10-CM

## 2020-12-16 LAB — CBC WITH DIFFERENTIAL/PLATELET
Abs Immature Granulocytes: 0.11 10*3/uL — ABNORMAL HIGH (ref 0.00–0.07)
Basophils Absolute: 0.1 10*3/uL (ref 0.0–0.1)
Basophils Relative: 1 %
Eosinophils Absolute: 0 10*3/uL (ref 0.0–0.5)
Eosinophils Relative: 0 %
HCT: 25.9 % — ABNORMAL LOW (ref 36.0–46.0)
Hemoglobin: 8.6 g/dL — ABNORMAL LOW (ref 12.0–15.0)
Immature Granulocytes: 1 %
Lymphocytes Relative: 2 %
Lymphs Abs: 0.3 10*3/uL — ABNORMAL LOW (ref 0.7–4.0)
MCH: 28.6 pg (ref 26.0–34.0)
MCHC: 33.2 g/dL (ref 30.0–36.0)
MCV: 86 fL (ref 80.0–100.0)
Monocytes Absolute: 0.8 10*3/uL (ref 0.1–1.0)
Monocytes Relative: 5 %
Neutro Abs: 12.9 10*3/uL — ABNORMAL HIGH (ref 1.7–7.7)
Neutrophils Relative %: 91 %
Platelets: 305 10*3/uL (ref 150–400)
RBC: 3.01 MIL/uL — ABNORMAL LOW (ref 3.87–5.11)
RDW: 14.9 % (ref 11.5–15.5)
Smear Review: NORMAL
WBC Morphology: INCREASED
WBC: 14.1 10*3/uL — ABNORMAL HIGH (ref 4.0–10.5)
nRBC: 0 % (ref 0.0–0.2)

## 2020-12-16 LAB — COMPREHENSIVE METABOLIC PANEL
ALT: 19 U/L (ref 0–44)
AST: 63 U/L — ABNORMAL HIGH (ref 15–41)
Albumin: 1.8 g/dL — ABNORMAL LOW (ref 3.5–5.0)
Alkaline Phosphatase: 176 U/L — ABNORMAL HIGH (ref 38–126)
Anion gap: 16 — ABNORMAL HIGH (ref 5–15)
BUN: 44 mg/dL — ABNORMAL HIGH (ref 8–23)
CO2: 16 mmol/L — ABNORMAL LOW (ref 22–32)
Calcium: 7.4 mg/dL — ABNORMAL LOW (ref 8.9–10.3)
Chloride: 99 mmol/L (ref 98–111)
Creatinine, Ser: 2.01 mg/dL — ABNORMAL HIGH (ref 0.44–1.00)
GFR, Estimated: 27 mL/min — ABNORMAL LOW (ref >60)
Glucose, Bld: 121 mg/dL — ABNORMAL HIGH (ref 70–99)
Potassium: 4.9 mmol/L (ref 3.5–5.1)
Sodium: 131 mmol/L — ABNORMAL LOW (ref 135–145)
Total Bilirubin: 1.5 mg/dL — ABNORMAL HIGH (ref 0.3–1.2)
Total Protein: 4.7 g/dL — ABNORMAL LOW (ref 6.5–8.1)

## 2020-12-16 LAB — RESP PANEL BY RT-PCR (FLU A&B, COVID) ARPGX2
Influenza A by PCR: NEGATIVE
Influenza B by PCR: NEGATIVE
SARS Coronavirus 2 by RT PCR: NEGATIVE

## 2020-12-16 LAB — I-STAT ARTERIAL BLOOD GAS, ED
Acid-base deficit: 13 mmol/L — ABNORMAL HIGH (ref 0.0–2.0)
Bicarbonate: 12.7 mmol/L — ABNORMAL LOW (ref 20.0–28.0)
Calcium, Ion: 1.03 mmol/L — ABNORMAL LOW (ref 1.15–1.40)
HCT: 23 % — ABNORMAL LOW (ref 36.0–46.0)
Hemoglobin: 7.8 g/dL — ABNORMAL LOW (ref 12.0–15.0)
O2 Saturation: 98 %
Potassium: 4.6 mmol/L (ref 3.5–5.1)
Sodium: 131 mmol/L — ABNORMAL LOW (ref 135–145)
TCO2: 14 mmol/L — ABNORMAL LOW (ref 22–32)
pCO2 arterial: 28 mmHg — ABNORMAL LOW (ref 32.0–48.0)
pH, Arterial: 7.265 — ABNORMAL LOW (ref 7.350–7.450)
pO2, Arterial: 125 mmHg — ABNORMAL HIGH (ref 83.0–108.0)

## 2020-12-16 LAB — TROPONIN I (HIGH SENSITIVITY): Troponin I (High Sensitivity): 1541 ng/L (ref <18)

## 2020-12-16 LAB — TSH: TSH: 2.293 u[IU]/mL (ref 0.350–4.500)

## 2020-12-16 LAB — CK: Total CK: 61 U/L (ref 38–234)

## 2020-12-16 LAB — CBG MONITORING, ED
Glucose-Capillary: 114 mg/dL — ABNORMAL HIGH (ref 70–99)
Glucose-Capillary: 105 mg/dL — ABNORMAL HIGH (ref 70–99)

## 2020-12-16 LAB — LACTIC ACID, PLASMA: Lactic Acid, Venous: 4.6 mmol/L (ref 0.5–1.9)

## 2020-12-16 LAB — BRAIN NATRIURETIC PEPTIDE: B Natriuretic Peptide: 940.5 pg/mL — ABNORMAL HIGH (ref 0.0–100.0)

## 2020-12-16 LAB — POC OCCULT BLOOD, ED: Fecal Occult Bld: NEGATIVE

## 2020-12-16 LAB — AMMONIA: Ammonia: 57 umol/L — ABNORMAL HIGH (ref 9–35)

## 2020-12-16 LAB — LIPASE, BLOOD: Lipase: 34 U/L (ref 11–51)

## 2020-12-16 MED ORDER — SODIUM CHLORIDE 0.9 % IV BOLUS
500.0000 mL | Freq: Once | INTRAVENOUS | Status: AC
  Administered 2020-12-16: 500 mL via INTRAVENOUS

## 2020-12-16 MED ORDER — SODIUM CHLORIDE 0.9 % IV SOLN
2.0000 g | Freq: Once | INTRAVENOUS | Status: AC
  Administered 2020-12-16: 2 g via INTRAVENOUS
  Filled 2020-12-16: qty 2

## 2020-12-16 MED ORDER — ATROPINE SULFATE 1 MG/10ML IJ SOSY
0.5000 mg | PREFILLED_SYRINGE | Freq: Once | INTRAMUSCULAR | Status: AC
  Administered 2020-12-16: 0.5 mg via INTRAVENOUS
  Filled 2020-12-16: qty 10

## 2020-12-16 MED ORDER — VANCOMYCIN HCL 1250 MG/250ML IV SOLN
1250.0000 mg | Freq: Once | INTRAVENOUS | Status: AC
  Administered 2020-12-16: 1250 mg via INTRAVENOUS
  Filled 2020-12-16: qty 250

## 2020-12-16 MED ORDER — NOREPINEPHRINE 4 MG/250ML-% IV SOLN
2.0000 ug/min | INTRAVENOUS | Status: DC
  Filled 2020-12-16: qty 250

## 2020-12-16 MED ORDER — STERILE WATER FOR INJECTION IV SOLN
INTRAVENOUS | Status: DC
  Filled 2020-12-16: qty 1000

## 2020-12-16 MED ORDER — METRONIDAZOLE 500 MG/100ML IV SOLN
500.0000 mg | Freq: Once | INTRAVENOUS | Status: AC
  Administered 2020-12-16: 500 mg via INTRAVENOUS
  Filled 2020-12-16: qty 100

## 2020-12-16 MED ORDER — VANCOMYCIN HCL 1000 MG/200ML IV SOLN: 1000.0000 mg | INTRAVENOUS | Status: DC

## 2020-12-16 MED ORDER — DOCUSATE SODIUM 100 MG PO CAPS: 100.0000 mg | ORAL_CAPSULE | Freq: Two times a day (BID) | ORAL | Status: DC | PRN

## 2020-12-16 MED ORDER — SODIUM CHLORIDE 0.9 % IV SOLN: 250.0000 mL | INTRAVENOUS | Status: DC

## 2020-12-16 MED ORDER — ATROPINE SULFATE 1 MG/ML IJ SOLN
INTRAMUSCULAR | Status: AC | PRN
  Administered 2020-12-16 (×2): 1 mg via INTRAVENOUS

## 2020-12-16 MED ORDER — EPINEPHRINE 1 MG/10ML IJ SOSY
PREFILLED_SYRINGE | INTRAMUSCULAR | Status: AC | PRN
  Administered 2020-12-16: 1 mg via INTRAVENOUS

## 2020-12-16 MED ORDER — LACTATED RINGERS IV BOLUS
1000.0000 mL | Freq: Once | INTRAVENOUS | Status: AC
  Administered 2020-12-16: 1000 mL via INTRAVENOUS

## 2020-12-16 MED ORDER — PANTOPRAZOLE SODIUM 40 MG IV SOLR: 40.0000 mg | Freq: Every day | INTRAVENOUS | Status: DC

## 2020-12-16 MED ORDER — HEPARIN SODIUM (PORCINE) 5000 UNIT/ML IJ SOLN: 5000.0000 [IU] | Freq: Three times a day (TID) | INTRAMUSCULAR | Status: DC

## 2020-12-16 MED ORDER — SODIUM CHLORIDE 0.9 % IV SOLN: 2.0000 g | INTRAVENOUS | Status: DC

## 2020-12-16 MED ORDER — POLYETHYLENE GLYCOL 3350 17 G PO PACK: 17.0000 g | PACK | Freq: Every day | ORAL | Status: DC | PRN

## 2020-12-16 MED ORDER — EPINEPHRINE HCL 5 MG/250ML IV SOLN IN NS
INTRAVENOUS | Status: AC
  Filled 2020-12-16: qty 250

## 2020-12-17 LAB — PATHOLOGIST SMEAR REVIEW

## 2020-12-18 ENCOUNTER — Ambulatory Visit: Payer: Medicare Other | Admitting: Physician Assistant

## 2020-12-21 LAB — CULTURE, BLOOD (ROUTINE X 2): Culture: NO GROWTH

## 2021-01-05 ENCOUNTER — Ambulatory Visit: Payer: Medicare Other | Admitting: Physician Assistant

## 2021-01-07 ENCOUNTER — Ambulatory Visit: Payer: Medicare Other

## 2021-01-08 NOTE — ED Triage Notes (Signed)
BIB [facility] after family called to report that pt was having weakness and abdominal pain x 3. Pt was previously d/c from [facility] this weekend, per family pt had MI. Pt b/p upon arrival to pt house not palpable, 250 cc of NS given enroute

## 2021-01-08 NOTE — ED Notes (Signed)
NRB placed at pt.

## 2021-01-08 NOTE — Progress Notes (Signed)
Pharmacy Antibiotic Note  [name] is a 65 y.o. female admitted on 12/29/20 with sepsis.  Pharmacy has been consulted for vancomycin and cefepime dosing.  Pt was previously d/c from [facility] this weekend, post MI. Presents with weakness and abdominal pain. Patient now with mild leukocytosis (wbc 14), afebrile, Scr elevated to 2.01, BP soft and HR 50s.  Plan: Cefepime 2g q 24hr Vancomycin 1250mg  x1 loading dose Then Vancomycin 1000mg  q48 hr (AUC estimated 485 w/ Scr of 2.01) Follow up clinical symptoms, cultures and vanc levels if indicated     Temp (24hrs), Avg:97.9 F (36.6 C), Min:97.9 F (36.6 C), Max:97.9 F (36.6 C)  Recent Labs  Lab 12/10/20 1815 12/11/20 0026 12/11/20 0454 12/11/20 0656 12/11/20 1203 12/11/20 1639 12/12/20 0116 12/12/20 0628 2020/12/29 1545  WBC 27.6*  --  25.5*  --   --   --  18.0*  --  14.1*  CREATININE 1.94*   < > 1.27* 1.30* 1.39* 1.36* 1.27* 1.30*  --    < > = values in this interval not displayed.    Estimated Creatinine Clearance: 36.8 mL/min (A) (by C-G formula based on SCr of 1.3 mg/dL (H)).    Allergies  Allergen Reactions  . Dapagliflozin     Other reaction(s): Other Syncope    Antimicrobials this admission: Vanc 6/8 >> Cefepime 6/8 >> Metronidazole 6/8 >>  Dose adjustments this admission: N/A  Microbiology results: 6/8 BCx: pending  Thank you for allowing pharmacy to be a part of this patient's care.  Norina Buzzard, PharmD PGY1 Pharmacy Resident 2020/12/29 5:31 PM

## 2021-01-08 NOTE — ED Notes (Signed)
Patient transported to CT with this RN 

## 2021-01-08 NOTE — ED Notes (Signed)
This RN and Westly Pam, RN at bedside.

## 2021-01-08 NOTE — Code Documentation (Signed)
Patient time of death occurred at 29-Oct-2023. [name]

## 2021-01-08 NOTE — ED Notes (Signed)
This RN at bedside. Attempting to contact attending MD.

## 2021-01-08 NOTE — ED Notes (Signed)
Patient transported to CT 

## 2021-01-08 NOTE — ED Notes (Signed)
1mg atropine given

## 2021-01-08 NOTE — Code Documentation (Addendum)
Pt noted to be bradycardiac in the 30's. Dr. Regenia Skeeter aware and at bedside.

## 2021-01-08 NOTE — ED Provider Notes (Signed)
[facility] EMERGENCY DEPARTMENT Provider Note   CSN: [id number] Arrival date & time: 01/10/21  1148     History Chief Complaint  Patient presents with  . Weakness  . Abdominal Pain    [name] is a 65 y.o. female with PMHx HTN, Diabetes, Asthma, Hypothyroidism, HLD, CAD who presents to the ED today via EMS with complaint of generalized weakness/decline since being discharged from the hospital on 06/04. Pt initially presented to [facility] for syncope and weakness and found to be in DKA. Troponin collected during admission elevated at 3477 > 11,345 and pt was ultimately transferred to [facility].   Discharge Summary 1)CAD/NSTEMI/recurrent syncopal episodes--history of prior CABG in 2004,-presented with chest discomfort,recurrent syncopal episodes and elevated troponin Troponin 3,477>>11,345 >>20,778 >>19,799  -EKG normal sinus rhythm with mild LVH, no acute ST changes -Echo on 12/11/2020 with global hypokinesis EF of 35% and grade 3 diastolic dysfunction -Cardiology consulted and pt underwent heart cath 6/3. Findings of severe native CAD s/p CABG with continued patency of grafts, recommendation for medical therapy -Per Cardiology, recommendation for ASA, holding off on plavix given slight downtrending hgb, carvedilol to 6.18m bid, resume norvasc 2.535m 2)DKA--patient met DKA criteria on admission- -resolved with IVF and insulin gtt -continued on home dosed insulin, recommend close f/u with PCP  Daughter reports that since being home she has slowly declined. She has been getting all of her medications this morning until she vomited. Daughter also mentions a fall last night; found down in her room around 2 AM with unknown downtime however daughter does not believe it could be more than 1 hour. Pt believes she hit her head. Not currently anticoagulated due to hx of GI bleeds. Pt also complaining of diffuse abdominal pain. Daughter believes she has had some diarrhea. Pt denies  chest pain.   The history is provided by the patient, medical records and a relative.       Past Medical History:  Diagnosis Date  . Adenomatous polyp of colon   . Anemia   . Arthritis   . Asthma   . Carotid artery disease (HCWade Hampton  . Carpal tunnel syndrome   . Chronic renal insufficiency    Stage 3 Kidney disease  . Coronary artery disease   . Depression with anxiety   . Diabetes mellitus    type II  . Diverticulosis of colon (without mention of hemorrhage)   . GERD (gastroesophageal reflux disease)   . Heart murmur   . History of hiatal hernia   . Hyperlipidemia   . Hypertension   . Hypothyroidism   . PVD (peripheral vascular disease) ([facility]    Patient Active Problem List   Diagnosis Date Noted  . CAD (coronary artery disease)/NSTEMI 12/11/2020  . CHF (congestive heart failure), NYHA class III, chronic, combined/EF 35 % with Global Hypokinesis with Grade III- diastolic Dysfunction 0652/77/8242. Protein-calorie malnutrition, severe 12/11/2020  . NSTEMI (non-ST elevated myocardial infarction) (HCBismarck06/09/2020  . Demand ischemia of myocardium (HCVan [place]  . DKA (diabetic ketoacidosis) (HCHolt06/08/2020  . Syncope, vasovagal 12/10/2020  . Leukocytosis 12/10/2020  . Prolonged QT interval 12/10/2020  . Chronic kidney disease 12/10/2020  . Gangrenous toe (HCCrooksville06/08/2020  . Hypoalbuminemia 12/10/2020  . Dehydration 12/10/2020  . Hyperglycemia 12/10/2020  . High anion gap metabolic acidosis 0635/36/1443. Hypertensive urgency 12/10/2020  . Hypothyroidism 12/10/2020  . Critical lower limb ischemia (HCParole02/17/2022  . Abnormal CT scan, esophagus   . Hematochezia   .  GIB (gastrointestinal bleeding) 08/01/2017  . Pressure injury of skin 11/25/2016  . PAD (peripheral artery disease) ([place]) 11/23/2016  . Major depressive disorder, recurrent episode, moderate ([place]) 04/04/2016  . Disorder resulting from impaired renal function 10/27/2009  . Bilateral carotid artery stenosis  10/08/2009  . Essential hypertension 10/02/2008  . GERD 03/11/2008  . COLONIC POLYPS 03/10/2008  . DM2 (diabetes mellitus, type 2) ([place]) 03/10/2008  . Hyperlipidemia 03/10/2008  . ANXIETY DEPRESSION 03/10/2008  . Coronary atherosclerosis 03/10/2008  . PVD (peripheral vascular disease) ([place]) 03/10/2008  . Diverticulosis of colon 03/10/2008    Past Surgical History:  Procedure Laterality Date  . ABDOMINAL AORTAGRAM N/A 12/18/2013   Procedure: ABDOMINAL [name];  Surgeon: Wellington Hampshire, MD;  Location: [facility] CATH LAB;  Service: Cardiovascular;  Laterality: N/A;  . ABDOMINAL AORTOGRAM W/LOWER EXTREMITY N/A 11/16/2016   Procedure: Abdominal Aortogram w/Lower Extremity;  Surgeon: Wellington Hampshire, MD;  Location: [place] CV LAB;  Service: Cardiovascular;  Laterality: N/A;  . ABDOMINAL AORTOGRAM W/LOWER EXTREMITY N/A 01/25/2017   Procedure: Abdominal Aortogram w/Lower Extremity;  Surgeon: Wellington Hampshire, MD;  Location: [place] CV LAB;  Service: Cardiovascular;  Laterality: N/A;  only completed Lower Extremity  . ABDOMINAL AORTOGRAM W/LOWER EXTREMITY N/A 02/28/2018   Procedure: ABDOMINAL AORTOGRAM W/LOWER EXTREMITY;  Surgeon: Wellington Hampshire, MD;  Location: [place] CV LAB;  Service: Cardiovascular;  Laterality: N/A;  . ABDOMINAL AORTOGRAM W/LOWER EXTREMITY N/A 08/27/2020   Procedure: ABDOMINAL AORTOGRAM W/LOWER EXTREMITY;  Surgeon: Marty Heck, MD;  Location: [place] CV LAB;  Service: Cardiovascular;  Laterality: N/A;  . ABDOMINAL HYSTERECTOMY    . APPENDECTOMY    . APPLICATION OF WOUND VAC Right 02/13/2017   Procedure: APPLICATION OF INCISIONAL WOUND VAC RIGHT GROIN;  Surgeon: Elam Dutch, MD;  Location: [place];  Service: Vascular;  Laterality: Right;  . CHOLECYSTECTOMY    . CORONARY ARTERY BYPASS GRAFT  2004   x4  . ENDARTERECTOMY FEMORAL Left 11/23/2016   Procedure: ENDARTERECTOMY OF LEFT EXTERNAL COMMON FEMORAL ARTERY WITH EXTENDED LEFT PROFUNDOPLASTY;  Surgeon:  Elam Dutch, MD;  Location: [place];  Service: Vascular;  Laterality: Left;  . ENDARTERECTOMY FEMORAL Right 02/13/2017   Procedure: ENDARTERECTOMY RIGHT FEMORAL ARTERY WITH PROFUNDAPLASTY;  Surgeon: Elam Dutch, MD;  Location[facility] OR;  Service: Vascular;  Laterality: Right;  . ESOPHAGOGASTRODUODENOSCOPY N/A 08/02/2017   Procedure: ESOPHAGOGASTRODUODENOSCOPY (EGD);  Surgeon: Ladene Artist, MD;  [facility] ENDOSCOPY;  Service: Endoscopy;  Laterality: N/A;  . FEMORAL-POPLITEAL BYPASS GRAFT Left 11/23/2016   Procedure: LEFT FEMORAL-BELOW KNEE POPLITEAL ARTERY BYPASS;  Surgeon: Elam Dutch, MD;  Location: [place];  Service: Vascular;  Laterality: Left;  . FEMORAL-POPLITEAL BYPASS GRAFT Right 02/13/2017   Procedure: BYPASS GRAFT RIGHT FEMORAL-BELOW KNEE POPLITEAL ARTERY;  Surgeon: Elam Dutch, MD;  Location: [place];  Service: Vascular;  Laterality: Right;  . KNEE SURGERY Right 2013   arthroscopy  . LEFT HEART CATH AND CORONARY ANGIOGRAPHY N/A 12/11/2020   Procedure: LEFT HEART CATH AND CORONARY ANGIOGRAPHY;  Surgeon: Sherren Mocha, MD;  Location: [place] CV LAB;  Service: Cardiovascular;  Laterality: N/A;  . PATCH ANGIOPLASTY Right 02/13/2017   Procedure: VEIN PATCH ANGIOPLASTY RIGHT POPLITEAL ARTERY AND HEMASHIELD PATCH ANGIOPLASTY OF RIGHT FEMORAL ARTERY;  Surgeon: Elam Dutch, MD;  Location: [place];  Service: Vascular;  Laterality: Right;  . PERIPHERAL VASCULAR BALLOON ANGIOPLASTY  02/28/2018   Procedure: PERIPHERAL VASCULAR BALLOON ANGIOPLASTY;  Surgeon: Wellington Hampshire, MD;  Location: [facility]  INVASIVE CV LAB;  Service: Cardiovascular;;  Left Fem-pop Bypass  . PERIPHERAL VASCULAR INTERVENTION Right 08/28/2020   Procedure: PERIPHERAL VASCULAR INTERVENTION;  Surgeon: Elam Dutch, MD;  Location: [place] CV LAB;  Service: Cardiovascular;  Laterality: Right;  . PERIPHERAL VASCULAR THROMBECTOMY Right 08/28/2020   Procedure: LYSIS RECHECK;  Surgeon: Elam Dutch, MD;   Location: [place] CV LAB;  Service: Cardiovascular;  Laterality: Right;     OB History   No obstetric history on file.     Family History  Problem Relation Age of Onset  . Diabetes Mother   . Breast cancer Mother   . Heart disease Father   . Colon polyps Daughter   . Breast cancer Daughter   . Colon cancer Neg Hx   . Esophageal cancer Neg Hx   . Stomach cancer Neg Hx   . Rectal cancer Neg Hx     Social History   Tobacco Use  . Smoking status: Current Some Day Smoker    Packs/day: 0.50    Years: 30.00    Pack years: 15.00    Types: Cigarettes    Last attempt to quit: 12/09/2018    Years since quitting: 2.0  . Smokeless tobacco: Never Used  Vaping Use  . Vaping Use: Never used  Substance Use Topics  . Alcohol use: No    Alcohol/week: 0.0 standard drinks  . Drug use: No    Home Medications Prior to Admission medications   Medication Sig Start Date End Date Taking? Authorizing Provider  albuterol (PROVENTIL HFA;VENTOLIN HFA) 108 (90 BASE) MCG/ACT inhaler Inhale 2 puffs into the lungs every 6 (six) hours as needed for wheezing or shortness of breath.    [provider]  ALPRAZolam Duanne Moron) 1 MG tablet Take 1 mg by mouth 3 (three) times daily as needed for anxiety. 05/11/11   [provider]  amLODipine (NORVASC) 2.5 MG tablet Take 1 tablet (2.5 mg total) by mouth daily. 12/12/20 01/11/21  Donne Hazel, MD  aspirin EC 81 MG tablet Take 81 mg by mouth at bedtime.    [provider]  atorvastatin (LIPITOR) 80 MG tablet Take 80 mg by mouth daily.     [provider]  calcitRIOL (ROCALTROL) 0.25 MCG capsule Take 0.25 mcg by mouth every Monday, Wednesday, and Friday.  07/02/17   [provider]  carvedilol (COREG) 6.25 MG tablet Take 1 tablet (6.25 mg total) by mouth 2 (two) times daily with a meal. 12/12/20 01/11/21  Donne Hazel, MD  cetirizine (ZYRTEC) 10 MG tablet Take 10 mg by mouth daily. 06/30/17   [provider]   fenofibrate micronized (LOFIBRA) 200 MG capsule Take 200 mg by mouth daily.    [provider]  ferrous sulfate 325 (65 FE) MG tablet Take 325 mg by mouth 3 (three) times daily. 05/10/15   [provider]  Insulin Glargine (BASAGLAR KWIKPEN) 100 UNIT/ML SOPN Inject 20-60 Units into the skin 2 (two) times daily before a meal. Per sliding scale 01/26/17   [provider]  levothyroxine (SYNTHROID) 112 MCG tablet Take 1 tablet by mouth daily. 10/30/20   [provider]  Omega-3 Fatty Acids (FISH OIL) 1000 MG CAPS Take 1,000 mg by mouth 2 (two) times daily.    [provider]  pantoprazole (PROTONIX) 40 MG tablet Take 1 tablet (40 mg total) by mouth daily. 12/12/20 12/12/21  Donne Hazel, MD  PARoxetine (PAXIL) 40 MG tablet Take 60 mg by mouth at  bedtime.     [provider]  promethazine (PHENERGAN) 25 MG tablet Take 25 mg by mouth daily as needed for nausea or vomiting.  08/06/16   [provider]  triamcinolone cream (KENALOG) 0.1 % Apply 1 application topically 2 (two) times daily. 11/27/20   [provider]  Vitamin D, Ergocalciferol, (DRISDOL) 50000 UNITS CAPS capsule Take 50,000 Units by mouth 2 (two) times a week. Takes on Mondays and Fridays    [provider]    Allergies    Dapagliflozin  Review of Systems   Review of Systems  Constitutional: Negative for fever.  Cardiovascular: Negative for chest pain.  Gastrointestinal: Positive for abdominal pain, diarrhea, nausea and vomiting.  Neurological: Positive for weakness (generalized).  All other systems reviewed and are negative.   Physical Exam Updated Vital Signs BP (!) 109/58 (BP Location: Left Arm)   Pulse 73   Temp 97.9 F (36.6 C) (Oral)   Resp 15   SpO2 92%   Physical Exam Vitals and nursing note reviewed.  Constitutional:      Comments: Appears somewhat jaundiced. Thin frail female.  HENT:     Head: Normocephalic and atraumatic.  Eyes:      Extraocular Movements: Extraocular movements intact.     Conjunctiva/sclera: Conjunctivae normal.     Pupils: Pupils are equal, round, and reactive to light.  Cardiovascular:     Rate and Rhythm: Normal rate and regular rhythm.     Heart sounds: Normal heart sounds.  Pulmonary:     Effort: Pulmonary effort is normal.     Breath sounds: Normal breath sounds. No wheezing, rhonchi or rales.     Comments: O2 sats 89-90% on RA. Placed on 2L Abdominal:     General: Abdomen is flat.     Palpations: Abdomen is soft.     Tenderness: There is generalized abdominal tenderness. There is guarding (voluntary). There is no rebound.  Musculoskeletal:     Cervical back: Neck supple.  Skin:    General: Skin is warm and dry.  Neurological:     Mental Status: She is alert and oriented to person, place, and time.     ED Results / Procedures / Treatments   Labs (all labs ordered are listed, but only abnormal results are displayed) Labs Reviewed  COMPREHENSIVE METABOLIC PANEL - Abnormal; Notable for the following components:      Result Value   Sodium 131 (*)    CO2 16 (*)    Glucose, Bld 121 (*)    BUN 44 (*)    Creatinine, Ser 2.01 (*)    Calcium 7.4 (*)    Total Protein 4.7 (*)    Albumin 1.8 (*)    AST 63 (*)    Alkaline Phosphatase 176 (*)    Total Bilirubin 1.5 (*)    GFR, Estimated 27 (*)    Anion gap 16 (*)    All other components within normal limits  CBC WITH DIFFERENTIAL/PLATELET - Abnormal; Notable for the following components:   WBC 14.1 (*)    RBC 3.01 (*)    Hemoglobin 8.6 (*)    HCT 25.9 (*)    Neutro Abs 12.9 (*)    Lymphs Abs 0.3 (*)    Abs Immature Granulocytes 0.11 (*)    All other components within normal limits  CBG MONITORING, ED - Abnormal; Notable for the following components:   Glucose-Capillary 114 (*)    All other components within normal limits  I-STAT ARTERIAL BLOOD  GAS, ED - Abnormal; Notable for the following components:   pH, Arterial 7.265 (*)     pCO2 arterial 28.0 (*)    pO2, Arterial 125 (*)    Bicarbonate 12.7 (*)    TCO2 14 (*)    Acid-base deficit 13.0 (*)    Sodium 131 (*)    Calcium, Ion 1.03 (*)    HCT 23.0 (*)    Hemoglobin 7.8 (*)    All other components within normal limits  CBG MONITORING, ED - Abnormal; Notable for the following components:   Glucose-Capillary 105 (*)    All other components within normal limits  TROPONIN I (HIGH SENSITIVITY) - Abnormal; Notable for the following components:   Troponin I (High Sensitivity) 1,541 (*)    All other components within normal limits  RESP PANEL BY RT-PCR (FLU A&B, COVID) ARPGX2  CULTURE, BLOOD (ROUTINE X 2)  CULTURE, BLOOD (ROUTINE X 2)  LIPASE, BLOOD  CK  BRAIN NATRIURETIC PEPTIDE  URINALYSIS, ROUTINE W REFLEX MICROSCOPIC  LACTIC ACID, PLASMA  LACTIC ACID, PLASMA  AMMONIA  TSH  PATHOLOGIST SMEAR REVIEW  POC OCCULT BLOOD, ED  TROPONIN I (HIGH SENSITIVITY)    EKG EKG Interpretation  Date/Time:  2020-12-19 12:07:52 EDT Ventricular Rate:  72 PR Interval:  162 QRS Duration: 160 QT Interval:  478 QTC Calculation: 524 R Axis:   -11 Text Interpretation: Sinus rhythm Left bundle branch block No significant change since last tracing Confirmed by Lajean Saver [id number]) on 12/19/2020 12:29:10 PM   Radiology CT ABDOMEN PELVIS WO CONTRAST  Result Date: December 19, 2020 CLINICAL DATA:  Weakness, abdominal pain EXAM: CT ABDOMEN AND PELVIS WITHOUT CONTRAST TECHNIQUE: Multidetector CT imaging of the abdomen and pelvis was performed following the standard protocol without IV contrast. COMPARISON:  08/01/2017 FINDINGS: Lower chest: And airspace disease in both lower lobes, right greater than left concerning for pneumonia. Trace effusions. Cardiomegaly. Distal aortic calcifications. Hepatobiliary: Prior colic/side No focal liver abnormality is seen. Status post cholecystectomy. No biliary dilatation. Pancreas: No focal abnormality or ductal dilatation. Spleen: No focal  abnormality.  Normal size. Adrenals/Urinary Tract: Diffuse renovascular calcifications. No adrenal abnormality. No focal renal abnormality. No stones or hydronephrosis. Urinary bladder is unremarkable. Stomach/Bowel: Small bowel loops are dilated and fluid-filled. Terminal ileum is decompressed. Transition appears to be in the right lower pelvis. No visible obstructing process. Findings compatible with distal small bowel obstruction. Fecalization of distal ileal small bowel loops just proximal to the transition. Stool and gas noted throughout the decompressed large bowel. Vascular/Lymphatic: Heavily calcified aorta, iliac vessels and branch vessels. No evidence of aneurysm or adenopathy. Reproductive: Prior hysterectomy.  No adnexal masses. Other: Small amount of free fluid in the pelvis.  No free air. Musculoskeletal: No acute bony abnormality. IMPRESSION: Dilated, fluid-filled small bowel loops with fecalization of distal small bowel and transition in the distal ileum to decompressed distal small bowel. Findings compatible with distal small bowel obstruction. Heavily calcified aorta, iliac vessels and branch vessels. Small amount of free fluid in the pelvis. Electronically Signed   By: Rolm Baptise M.D.   On: December 19, 2020 18:48   CT Head Wo Contrast  Result Date: 12-19-2020 CLINICAL DATA:  Head trauma. EXAM: CT HEAD WITHOUT CONTRAST TECHNIQUE: Contiguous axial images were obtained from the base of the skull through the vertex without intravenous contrast. COMPARISON:  December 10, 2020 FINDINGS: Brain: No evidence of acute infarction, hemorrhage, hydrocephalus, extra-axial collection or mass lesion/mass effect. Vascular: Advanced calcific atherosclerotic disease of the intra cavernous carotid  arteries. Skull: Normal. Negative for fracture or focal lesion. Sinuses/Orbits: No acute finding. Other: None. IMPRESSION: 1. No acute intracranial abnormality. 2. Advanced calcific atherosclerotic disease of the intra cavernous  carotid arteries. Electronically Signed   By: Fidela Salisbury M.D.   On: 12/30/20 13:35   DG Chest Port 1 View  Result Date: 12-30-20 CLINICAL DATA:  Rule out infection. EXAM: PORTABLE CHEST 1 VIEW COMPARISON:  December 10, 2020 FINDINGS: Postsurgical changes from CABG. Cardiomediastinal silhouette is normal. Mediastinal contours appear intact. There is no evidence of focal airspace consolidation, pleural effusion or pneumothorax. Osseous structures are without acute abnormality. Soft tissues are grossly normal. IMPRESSION: No active disease. Electronically Signed   By: Fidela Salisbury M.D.   On: 12/30/2020 12:56    Procedures .Critical Care Performed by: Eustaquio Maize, PA-C Authorized by: Eustaquio Maize, PA-C   Critical care provider statement:    Critical care time (minutes):  45   Critical care was time spent personally by me on the following activities:  Discussions with consultants, evaluation of patient's response to treatment, examination of patient, ordering and performing treatments and interventions, ordering and review of laboratory studies, ordering and review of radiographic studies, pulse oximetry, re-evaluation of patient's condition, obtaining history from patient or surrogate and review of old charts     Medications Ordered in ED Medications  ceFEPIme (MAXIPIME) 2 g in sodium chloride 0.9 % 100 mL IVPB ( Intravenous Infusion Verify December 30, 2020 1854)  vancomycin (VANCOREADY) IVPB 1250 mg/250 mL (has no administration in time range)  sodium bicarbonate 150 mEq in sterile water 1,150 mL infusion ( Intravenous New Bag/Given Dec 30, 2020 1751)  vancomycin (VANCOREADY) IVPB 1000 mg/200 mL (has no administration in time range)  ceFEPIme (MAXIPIME) 2 g in sodium chloride 0.9 % 100 mL IVPB (has no administration in time range)  sodium chloride 0.9 % bolus 500 mL (0 mLs Intravenous Stopped 2020/12/30 1454)  sodium chloride 0.9 % bolus 500 mL (0 mLs Intravenous Stopped 2020/12/30 1724)  sodium  chloride 0.9 % bolus 500 mL (0 mLs Intravenous Stopped Dec 30, 2020 1854)  metroNIDAZOLE (FLAGYL) IVPB 500 mg (0 mg Intravenous Stopped 2020/12/30 1907)  lactated ringers bolus 1,000 mL (1,000 mLs Intravenous New Bag/Given 12-30-2020 1858)    ED Course  I have reviewed the triage vital signs and the nursing notes.  Pertinent labs & imaging results that were available during my care of the patient were reviewed by me and considered in my medical decision making (see chart for details).  Clinical Course as of Dec 30, 2020 1913  Wed 12-30-2020 Glucose-Capillary(!): 114 [MV]    Clinical Course User Index [MV] Eustaquio Maize, [place]   MDM Rules/Calculators/A&P                          65 year old female who presents to the ED today with generalized weakness, diffuse abdominal pain, nausea, vomiting, diarrhea since yesterday.  Recently discharged from the hospital on 06/04 after presenting for syncope/weakness and found to be in DKA with uptrending troponins.  Had a catheterization done with patent grafts.  Medical management at that time.  Held off on Plavix due to low trending hemoglobin.  Does not appear to have required blood transfusion at while in the hospital.  On arrival to the ED today patient is afebrile, nontachycardic and nontachypneic.  Blood pressure slightly low at 109/58.  Her O2 sats are 89 to 90%, not typically on oxygen.  Denies shortness of breath.  Placed  on 2 L at this time.  Daughter also mentions fall sometime in the night with unknown downtime.  Patient believes she did hit her head.  We will plan to work-up with CT head at this time for head trauma, chest x-ray to rule out infection due to hypoxia, lab work for abdominal pain including CBC, CMP, lipase.  We will also add on troponin given history of recent hospitalization and BNP.  CK added due to unknown downtime.  Will obtain urinalysis to rule out infection as well.  Patient will likely require admission to the hospital again.   CBG  114 EKG without acute ischemic changes CT Head negative CXR Clear  Workup significantly delayed due to difficulty obtaining blood despite IV access; would not draw back.  CBC with elevated WBC count 14,100 however improved compared to previous  WBC  Date Value Ref Range Status  12-18-20 14.1 (H) 4.0 - 10.5 K/uL Final  12/12/2020 18.0 (H) 4.0 - 10.5 K/uL Final  12/11/2020 25.5 (H) 4.0 - 10.5 K/uL Final  12/10/2020 27.6 (H) 4.0 - 10.5 K/uL Final   Hgb dropped 8.6 (down from 10.1). Fecal occult negative; stool white in color on my exam  During rectal exam and reevaluation pt significantly more altered compared to previous and O2 sats decreasing; placed on NRB at this time. ABG obtained - pH 7.265 and bicarb 12.7; appears more metabolic than respiratory. Awaiting CMP.   CMP with glucose 121, sodium 131, bicarb 16. Gap of 16. Creatinine 2.01 and BUN 44. LFts elevated as well, alk phos 176. T bili 1.5. AST 63.   Lab Results  Component Value Date   CREATININE 2.01 (H) 2020-12-18   CREATININE 1.30 (H) 12/12/2020   CREATININE 1.27 (H) 12/12/2020   Bicarb infusion initiated at this time. Lactic acid ordered and septic protocol started given worsening condition. Awaiting CT A/P prior to admission.   Troponin elevated at 1,541 however improved from previous admission. Will trend.   CT: IMPRESSION:  Dilated, fluid-filled small bowel loops with fecalization of distal  small bowel and transition in the distal ileum to decompressed  distal small bowel. Findings compatible with distal small bowel  obstruction.    Heavily calcified aorta, iliac vessels and branch vessels.    Small amount of free fluid in the pelvis.   Attending physician Dr. Regenia Skeeter discussed case with critical care who will come evaluate patient. Waiting on call from general surgery given CT findings.   Dr. Zenia Resides General surgery to come see patient. Recommends NG tube at this time.   This note was prepared using  Dragon voice recognition software and may include unintentional dictation errors due to the inherent limitations of voice recognition software.  Final Clinical Impression(s) / ED Diagnoses Final diagnoses:  SBO (small bowel obstruction) (HCC)  Anemia, unspecified type  Sepsis without acute organ dysfunction, due to unspecified organism ([place])  Hypotension, unspecified hypotension type    Rx / DC Orders ED Discharge Orders    None       Eustaquio Maize, PA-C 2020/12/18 1913    Sherwood Gambler, MD Dec 18, 2020 2002

## 2021-01-08 NOTE — Code Documentation (Signed)
Dr. Verta Ellen at bedside.

## 2021-01-08 NOTE — ED Notes (Signed)
Family at bedside. 

## 2021-01-08 NOTE — ED Notes (Signed)
Notified RN of pts HR

## 2021-01-08 NOTE — Progress Notes (Signed)
Chaplain was paged for family support as patient had died.  Chaplain arrived and daughter Hoyle Sauer was present.  She was in shock and disbelief.  Chaplain offered space for daughter to share about the events leading up to the death.  Daughter wanted help in what to do next and chaplain provided patient placement card and shared decisions did not have to be made tonight.  Daughter spoke of faith being important to the patient and she believed she was at peace now.  Chaplain coordinated with charge nurse for other family to be called and come to the hospital.  Chaplain offered support for 2 grandchildren and other daughter.  Chaplain offered non anxious presence and words of comfort and support.   [place], [place].    12-19-20 [id number]  Clinical Encounter Type  Visited With Patient and family together;Health care provider  Visit Type ED;Death  Referral From Nurse  Consult/Referral To Chaplain  Spiritual Encounters  Spiritual Needs Grief support;Emotional

## 2021-01-08 NOTE — ED Provider Notes (Addendum)
Patient is quite ill.  She has become progressively more encephalopathic.  She is noted to be dropping her pressures.  Was given fluids which does seem to cause some response in her blood pressure.  Requiring some oxygen.  Blood gas indicates significant metabolic acidosis.  Unfortunately her lactate is now 12.  She also has acute kidney injury.  I think a lot of her problems are stemming from the kidney injury which is probably making her hold onto meds longer as well.  Repeat ECG as she is developing a more bradycardic rhythm shows what is probably a junctional rhythm.  I discussed with Dr. Sallyanne Kuster, who agrees with ultimately fixing her acidosis but could try atropine and see if this helps her blood pressure.  Otherwise, is likely multifactorial.  She is also found to have a small bowel obstruction which is probably leading to the kidney injury.  She is given fluids, bicarb, and broad antibiotics.  Intensivist consulted for admission.   CRITICAL CARE Performed by: Ephraim Hamburger   Total critical care time: 60 minutes  Critical care time was exclusive of separately billable procedures and treating other patients.  Critical care was necessary to treat or prevent imminent or life-threatening deterioration.  Critical care was time spent personally by me on the following activities: development of treatment plan with patient and/or surrogate as well as nursing, discussions with consultants, evaluation of patient's response to treatment, examination of patient, obtaining history from patient or surrogate, ordering and performing treatments and interventions, ordering and review of laboratory studies, ordering and review of radiographic studies, pulse oximetry and re-evaluation of patient's condition.    Sherwood Gambler, MD Jan 01, 2021 10-16-00  Unfortunately I was called to the bedside as the patient all of a sudden became quite bradycardic.  She had been made a partial code, intubation only and no CPR.   Pulses faintly present and then diminished.  She was given atropine and epi with no response.  Unfortunately she was pronounced dead at 2023-10-17. I informed ICU and will inform daughter    Sherwood Gambler, MD 01/01/2021 17-Oct-2031

## 2021-01-08 NOTE — Consult Note (Signed)
JONESHA TSUCHIYA 25-Jun-1956  [id number].    Chief Complaint/Reason for Consult: small bowel obstruction  HPI:  Ms. Schwalm is a 65 yo female with an extensive medical history including PAD, HLD, HTN, DM, CAD and recent admission for DKA and NSTEMI who presented to the ED today with generalized weakness and lethargy since her recent discharge on 6/4. She also had a syncopal episode early this morning and vomited after taking her morning meds. The emesis was nonbloody and nonbilious. The patient's daughter reports the patient has had ongoing nausea and vomiting for the last 6 months. She last had a bowel movement yesterday and it was loose. In the ED the patient reported abdominal pain, and has been hypotensive and bradycardic. Labs are significant for an AKI and metabolic acidosis, with a lactate of 4.6. WBC is 14 but this is down from 18 at discharge 4 days ago. Troponin remains elevated but continues to downtrend. A CT abdomen/pelvis was done and is concerning for a small bowel obstruction. General surgery was consulted.   The patient's prior abdominal surgeries include a hysterectomy, appendectomy, and open cholecystectomy. During her recent admission a LHC showed patency of her prior bypass grafts. Medical management was recommended with aspirin.  ROS: Review of Systems  Constitutional: Negative for fever.  Gastrointestinal: Positive for abdominal pain and vomiting.    Family History  Problem Relation Age of Onset  . Diabetes Mother   . Breast cancer Mother   . Heart disease Father   . Colon polyps Daughter   . Breast cancer Daughter   . Colon cancer Neg Hx   . Esophageal cancer Neg Hx   . Stomach cancer Neg Hx   . Rectal cancer Neg Hx     Past Medical History:  Diagnosis Date  . Adenomatous polyp of colon   . Anemia   . Arthritis   . Asthma   . Carotid artery disease ([place])   . Carpal tunnel syndrome   . Chronic renal insufficiency    Stage 3 Kidney disease  . Coronary  artery disease   . Depression with anxiety   . Diabetes mellitus    type II  . Diverticulosis of colon (without mention of hemorrhage)   . GERD (gastroesophageal reflux disease)   . Heart murmur   . History of hiatal hernia   . Hyperlipidemia   . Hypertension   . Hypothyroidism   . PVD (peripheral vascular disease) ([place])     Past Surgical History:  Procedure Laterality Date  . ABDOMINAL AORTAGRAM N/A 12/18/2013   Procedure: ABDOMINAL [name];  Surgeon: Wellington Hampshire, MD;  Location: [facility] CATH LAB;  Service: Cardiovascular;  Laterality: N/A;  . ABDOMINAL AORTOGRAM W/LOWER EXTREMITY N/A 11/16/2016   Procedure: Abdominal Aortogram w/Lower Extremity;  Surgeon: Wellington Hampshire, MD;  Location: [facility] CV LAB;  Service: Cardiovascular;  Laterality: N/A;  . ABDOMINAL AORTOGRAM W/LOWER EXTREMITY N/A 01/25/2017   Procedure: Abdominal Aortogram w/Lower Extremity;  Surgeon: Wellington Hampshire, MD;  Location: [facility];  Service: Cardiovascular;  Laterality: N/A;  only completed Lower Extremity  . ABDOMINAL AORTOGRAM W/LOWER EXTREMITY N/A 02/28/2018   Procedure: ABDOMINAL AORTOGRAM W/LOWER EXTREMITY;  Surgeon: Wellington Hampshire, MD;  Location: [facility] CV LAB;  Service: Cardiovascular;  Laterality: N/A;  . ABDOMINAL AORTOGRAM W/LOWER EXTREMITY N/A 08/27/2020   Procedure: ABDOMINAL AORTOGRAM W/LOWER EXTREMITY;  Surgeon: Marty Heck, MD;  Location: [facility];  Service: Cardiovascular;  Laterality: N/A;  .  ABDOMINAL HYSTERECTOMY    . APPENDECTOMY    . APPLICATION OF WOUND VAC Right 02/13/2017   Procedure: APPLICATION OF INCISIONAL WOUND VAC RIGHT GROIN;  Surgeon: Elam Dutch, MD;  Location: [facility];  Service: Vascular;  Laterality: Right;  . CHOLECYSTECTOMY    . CORONARY ARTERY BYPASS GRAFT  2004   x4  . ENDARTERECTOMY FEMORAL Left 11/23/2016   Procedure: ENDARTERECTOMY OF LEFT EXTERNAL COMMON FEMORAL ARTERY WITH EXTENDED LEFT PROFUNDOPLASTY;  Surgeon: Elam Dutch, MD;  Location: [facility];  Service: Vascular;  Laterality: Left;  . ENDARTERECTOMY FEMORAL Right 02/13/2017   Procedure: ENDARTERECTOMY RIGHT FEMORAL ARTERY WITH PROFUNDAPLASTY;  Surgeon: Elam Dutch, MD;  Location: [facility] OR;  Service: Vascular;  Laterality: Right;  . ESOPHAGOGASTRODUODENOSCOPY N/A 08/02/2017   Procedure: ESOPHAGOGASTRODUODENOSCOPY (EGD);  Surgeon: Ladene Artist, MD;  Location: [facility] ENDOSCOPY;  Service: Endoscopy;  Laterality: N/A;  . FEMORAL-POPLITEAL BYPASS GRAFT Left 11/23/2016   Procedure: LEFT FEMORAL-BELOW KNEE POPLITEAL ARTERY BYPASS;  Surgeon: Elam Dutch, MD;  Location: [facility];  Service: Vascular;  Laterality: Left;  . FEMORAL-POPLITEAL BYPASS GRAFT Right 02/13/2017   Procedure: BYPASS GRAFT RIGHT FEMORAL-BELOW KNEE POPLITEAL ARTERY;  Surgeon: Elam Dutch, MD;  Location: [facility];  Service: Vascular;  Laterality: Right;  . KNEE SURGERY Right 2013   arthroscopy  . LEFT HEART CATH AND CORONARY ANGIOGRAPHY N/A 12/11/2020   Procedure: LEFT HEART CATH AND CORONARY ANGIOGRAPHY;  Surgeon: Sherren Mocha, MD;  Location: [facility] CV LAB;  Service: Cardiovascular;  Laterality: N/A;  . PATCH ANGIOPLASTY Right 02/13/2017   Procedure: VEIN PATCH ANGIOPLASTY RIGHT POPLITEAL ARTERY AND HEMASHIELD PATCH ANGIOPLASTY OF RIGHT FEMORAL ARTERY;  Surgeon: Elam Dutch, MD;  Location: [facility];  Service: Vascular;  Laterality: Right;  . PERIPHERAL VASCULAR BALLOON ANGIOPLASTY  02/28/2018   Procedure: PERIPHERAL VASCULAR BALLOON ANGIOPLASTY;  Surgeon: Wellington Hampshire, MD;  Location: [facility] CV LAB;  Service: Cardiovascular;;  Left Fem-pop Bypass  . PERIPHERAL VASCULAR INTERVENTION Right 08/28/2020   Procedure: PERIPHERAL VASCULAR INTERVENTION;  Surgeon: Elam Dutch, MD;  Location: [facility] CV LAB;  Service: Cardiovascular;  Laterality: Right;  . PERIPHERAL VASCULAR THROMBECTOMY Right 08/28/2020   Procedure: LYSIS RECHECK;  Surgeon: Elam Dutch, MD;  Location: [facility] CV LAB;  Service: Cardiovascular;  Laterality: Right;    Social History:  reports that she has been smoking cigarettes. She has a 15.00 pack-year smoking history. She has never used smokeless tobacco. She reports that she does not drink alcohol and does not use drugs.  Allergies:  Allergies  Allergen Reactions  . Dapagliflozin     Other reaction(s): Other Syncope    (Not in a hospital admission)    Physical Exam: Blood pressure (!) 82/63, pulse (!) 47, temperature 97.9 F (36.6 C), temperature source Oral, resp. rate (!) 21, SpO2 93 %. General: resting in bed, appears ill Neurological: lethargic but arousable HEENT: normocephalic, atraumatic, no scleral icterus CV: bradycardic in 40s Respiratory: normal work of breathing, symmetric chest wall expansion Abdomen: soft, nondistended, mildly tender to palpation, no rebound tenderness or guarding. Well-healed RUQ surgical scar. Skin: no jaundice, no rashes or lesions   Results for orders placed or performed during the hospital encounter of 02-Jan-2021 (from the past 48 hour(s))  Resp Panel by RT-PCR (Flu A&B, Covid) Nasopharyngeal Swab     Status: None   Collection Time: 01/02/21 12:33 PM   Specimen: Nasopharyngeal Swab; Nasopharyngeal(NP) swabs in vial transport medium  Result Value Ref Range  SARS Coronavirus 2 by RT PCR NEGATIVE NEGATIVE    Comment: (NOTE) SARS-CoV-2 target nucleic acids are NOT DETECTED.  The SARS-CoV-2 RNA is generally detectable in upper respiratory specimens during the acute phase of infection. The lowest concentration of SARS-CoV-2 viral copies this assay can detect is 138 copies/mL. A negative result does not preclude SARS-Cov-2 infection and should not be used as the sole basis for treatment or other patient management decisions. A negative result may occur with  improper specimen collection/handling, submission of specimen other than nasopharyngeal swab, presence of viral mutation(s) within  the areas targeted by this assay, and inadequate number of viral copies(<138 copies/mL). A negative result must be combined with clinical observations, patient history, and epidemiological information. The expected result is Negative.  Fact Sheet for Patients:  EntrepreneurPulse.com.au  Fact Sheet for Healthcare Providers:  IncredibleEmployment.be  This test is no t yet approved or cleared by the [place] FDA and  has been authorized for detection and/or diagnosis of SARS-CoV-2 by FDA under an Emergency Use Authorization (EUA). This EUA will remain  in effect (meaning this test can be used) for the duration of the COVID-19 declaration under Section 564(b)(1) of the Act, 21 U.S.C.section 360bbb-3(b)(1), unless the authorization is terminated  or revoked sooner.       Influenza A by PCR NEGATIVE NEGATIVE   Influenza B by PCR NEGATIVE NEGATIVE    Comment: (NOTE) The Xpert Xpress SARS-CoV-2/FLU/RSV plus assay is intended as an aid in the diagnosis of influenza from Nasopharyngeal swab specimens and should not be used as a sole basis for treatment. Nasal washings and aspirates are unacceptable for Xpert Xpress SARS-CoV-2/FLU/RSV testing.  Fact Sheet for Patients: EntrepreneurPulse.com.au  Fact Sheet for Healthcare Providers: IncredibleEmployment.be  This test is not yet approved or cleared by the [place] FDA and has been authorized for detection and/or diagnosis of SARS-CoV-2 by FDA under an Emergency Use Authorization (EUA). This EUA will remain in effect (meaning this test can be used) for the duration of the COVID-19 declaration under Section 564(b)(1) of the Act, 21 U.S.C. section 360bbb-3(b)(1), unless the authorization is terminated or revoked.  Performed at [facility], [place] [address] [place], [place] [address]   POC CBG, ED     Status: Abnormal   Collection Time: 01-04-21  1:36  PM  Result Value Ref Range   Glucose-Capillary 114 (H) 70 - 99 mg/dL    Comment: Glucose reference range applies only to samples taken after fasting for at least 8 hours.  Comprehensive metabolic panel     Status: Abnormal   Collection Time: 01/04/2021  3:45 PM  Result Value Ref Range   Sodium 131 (L) 135 - 145 mmol/L   Potassium 4.9 3.5 - 5.1 mmol/L   Chloride 99 98 - 111 mmol/L   CO2 16 (L) 22 - 32 mmol/L   Glucose, Bld 121 (H) 70 - 99 mg/dL    Comment: Glucose reference range applies only to samples taken after fasting for at least 8 hours.   BUN 44 (H) 8 - 23 mg/dL   Creatinine, Ser 2.01 (H) 0.44 - 1.00 mg/dL   Calcium 7.4 (L) 8.9 - 10.3 mg/dL   Total Protein 4.7 (L) 6.5 - 8.1 g/dL   Albumin 1.8 (L) 3.5 - 5.0 g/dL   AST 63 (H) 15 - 41 U/L   ALT 19 0 - 44 U/L   Alkaline Phosphatase 176 (H) 38 - 126 U/L   Total Bilirubin 1.5 (H) 0.3 -  1.2 mg/dL   GFR, Estimated 27 (L) >60 mL/min    Comment: (NOTE) Calculated using the CKD-EPI Creatinine Equation (2021)    Anion gap 16 (H) 5 - 15    Comment: Performed at [facility], [place] [address], [place], [place] [address]  CBC with Differential     Status: Abnormal   Collection Time: 12-31-20  3:45 PM  Result Value Ref Range   WBC 14.1 (H) 4.0 - 10.5 K/uL   RBC 3.01 (L) 3.87 - 5.11 MIL/uL   Hemoglobin 8.6 (L) 12.0 - 15.0 g/dL   HCT 25.9 (L) 36.0 - 46.0 %   MCV 86.0 80.0 - 100.0 fL   MCH 28.6 26.0 - 34.0 pg   MCHC 33.2 30.0 - 36.0 g/dL   RDW 14.9 11.5 - 15.5 %   Platelets 305 150 - 400 K/uL   nRBC 0.0 0.0 - 0.2 %   Neutrophils Relative % 91 %   Neutro Abs 12.9 (H) 1.7 - 7.7 K/uL   Lymphocytes Relative 2 %   Lymphs Abs 0.3 (L) 0.7 - 4.0 K/uL   Monocytes Relative 5 %   Monocytes Absolute 0.8 0.1 - 1.0 K/uL   Eosinophils Relative 0 %   Eosinophils Absolute 0.0 0.0 - 0.5 K/uL   Basophils Relative 1 %   Basophils Absolute 0.1 0.0 - 0.1 K/uL   WBC Morphology INCREASED BANDS (>20% BANDS)     Comment: MODERATE LEFT SHIFT (>5%  METAS AND MYELOS,OCC PRO NOTED) TOXIC GRANULATION VACUOLATED NEUTROPHILS    Smear Review Normal platelet morphology    Immature Granulocytes 1 %   Abs Immature Granulocytes 0.11 (H) 0.00 - 0.07 K/uL   Burr Cells PRESENT     Comment: Performed at [facility], [address], [place], [place] [address]  Lipase, blood     Status: None   Collection Time: 31-Dec-2020  3:45 PM  Result Value Ref Range   Lipase 34 11 - 51 U/L    Comment: Performed at [facility] [address] [place], [place] [address]  Troponin I (High Sensitivity)     Status: Abnormal   Collection Time: Dec 31, 2020  3:45 PM  Result Value Ref Range   Troponin I (High Sensitivity) 1,541 (HH) <18 ng/L    Comment: CRITICAL RESULT CALLED TO, READ BACK BY AND VERIFIED WITH:  Alesia Banda, RN, 1729, 31-Dec-2020, ADEDOKUNE (NOTE) Elevated high sensitivity troponin I (hsTnI) values and significant  changes across serial measurements may suggest ACS but many other  chronic and acute conditions are known to elevate hsTnI results.  Refer to the Links section for chest pain algorithms and additional  guidance. Performed at [facility], [place] [address], [place], [place] [address]   CK     Status: None   Collection Time: 2020/12/31  3:45 PM  Result Value Ref Range   Total CK 61 38 - 234 U/L    Comment: Performed at [facility], [place] [address], [place], [place] [address]  Lactic acid, plasma     Status: Abnormal   Collection Time: 2020/12/31  5:06 PM  Result Value Ref Range   Lactic Acid, Venous 4.6 (HH) 0.5 - 1.9 mmol/L    Comment: CRITICAL RESULT CALLED TO, READ BACK BY AND VERIFIED WITH:  PATE, Darnell Level, RN, 1939, 12/31/20, ADEDOKUNE Performed at [facility], [place] [address], [place], [place] [address]   Ammonia     Status: Abnormal   Collection Time:  12/23/2020  5:06 PM  Result Value Ref Range   Ammonia 57 (H) 9 - 35 umol/L    Comment: Performed at [facility] [address], [place], [place] [address]   POC occult blood, ED     Status: None   Collection Time: 2020-12-23  5:22 PM  Result Value Ref Range   Fecal Occult Bld NEGATIVE NEGATIVE  I-Stat arterial blood gas, [facility] ED)     Status: Abnormal   Collection Time: 23-Dec-2020  5:22 PM  Result Value Ref Range   pH, Arterial 7.265 (L) 7.350 - 7.450   pCO2 arterial 28.0 (L) 32.0 - 48.0 mmHg   pO2, Arterial 125 (H) 83.0 - 108.0 mmHg   Bicarbonate 12.7 (L) 20.0 - 28.0 mmol/L   TCO2 14 (L) 22 - 32 mmol/L   O2 Saturation 98.0 %   Acid-base deficit 13.0 (H) 0.0 - 2.0 mmol/L   Sodium 131 (L) 135 - 145 mmol/L   Potassium 4.6 3.5 - 5.1 mmol/L   Calcium, Ion 1.03 (L) 1.15 - 1.40 mmol/L   HCT 23.0 (L) 36.0 - 46.0 %   Hemoglobin 7.8 (L) 12.0 - 15.0 g/dL   Sample type ARTERIAL   CBG monitoring, ED     Status: Abnormal   Collection Time: 23-Dec-2020  5:23 PM  Result Value Ref Range   Glucose-Capillary 105 (H) 70 - 99 mg/dL    Comment: Glucose reference range applies only to samples taken after fasting for at least 8 hours.   Comment 1 Notify RN    Comment 2 Document in Chart   TSH     Status: None   Collection Time: 12-23-20  6:19 PM  Result Value Ref Range   TSH 2.293 0.350 - 4.500 uIU/mL    Comment: Performed by a 3rd Generation assay with a functional sensitivity of <=0.01 uIU/mL. Performed at [facility], [place] [address], [place], [place] [address]    CT ABDOMEN PELVIS WO CONTRAST  Result Date: 12/23/2020 CLINICAL DATA:  Weakness, abdominal pain EXAM: CT ABDOMEN AND PELVIS WITHOUT CONTRAST TECHNIQUE: Multidetector CT imaging of the abdomen and pelvis was performed following the standard protocol without IV contrast. COMPARISON:  08/01/2017 FINDINGS: Lower chest: And airspace disease in both lower lobes, right greater than left concerning for pneumonia. Trace effusions. Cardiomegaly. Distal aortic calcifications. Hepatobiliary: Prior colic/side No focal liver abnormality is seen. Status post cholecystectomy. No biliary dilatation. Pancreas: No  focal abnormality or ductal dilatation. Spleen: No focal abnormality.  Normal size. Adrenals/Urinary Tract: Diffuse renovascular calcifications. No adrenal abnormality. No focal renal abnormality. No stones or hydronephrosis. Urinary bladder is unremarkable. Stomach/Bowel: Small bowel loops are dilated and fluid-filled. Terminal ileum is decompressed. Transition appears to be in the right lower pelvis. No visible obstructing process. Findings compatible with distal small bowel obstruction. Fecalization of distal ileal small bowel loops just proximal to the transition. Stool and gas noted throughout the decompressed large bowel. Vascular/Lymphatic: Heavily calcified aorta, iliac vessels and branch vessels. No evidence of aneurysm or adenopathy. Reproductive: Prior hysterectomy.  No adnexal masses. Other: Small amount of free fluid in the pelvis.  No free air. Musculoskeletal: No acute bony abnormality. IMPRESSION: Dilated, fluid-filled small bowel loops with fecalization of distal small bowel and transition in the distal ileum to decompressed distal small bowel. Findings compatible with distal small bowel obstruction. Heavily calcified aorta, iliac vessels and branch vessels. Small amount of free fluid in the pelvis. Electronically Signed   By: Rolm Baptise M.D.  On: 12/22/2020 18:48   CT Head Wo Contrast  Result Date: 12-22-20 CLINICAL DATA:  Head trauma. EXAM: CT HEAD WITHOUT CONTRAST TECHNIQUE: Contiguous axial images were obtained from the base of the skull through the vertex without intravenous contrast. COMPARISON:  December 10, 2020 FINDINGS: Brain: No evidence of acute infarction, hemorrhage, hydrocephalus, extra-axial collection or mass lesion/mass effect. Vascular: Advanced calcific atherosclerotic disease of the intra cavernous carotid arteries. Skull: Normal. Negative for fracture or focal lesion. Sinuses/Orbits: No acute finding. Other: None. IMPRESSION: 1. No acute intracranial abnormality. 2. Advanced  calcific atherosclerotic disease of the intra cavernous carotid arteries. Electronically Signed   By: Fidela Salisbury M.D.   On: 12/22/2020 13:35   DG Chest Port 1 View  Result Date: 12-22-20 CLINICAL DATA:  Rule out infection. EXAM: PORTABLE CHEST 1 VIEW COMPARISON:  December 10, 2020 FINDINGS: Postsurgical changes from CABG. Cardiomediastinal silhouette is normal. Mediastinal contours appear intact. There is no evidence of focal airspace consolidation, pleural effusion or pneumothorax. Osseous structures are without acute abnormality. Soft tissues are grossly normal. IMPRESSION: No active disease. Electronically Signed   By: Fidela Salisbury M.D.   On: 12/22/20 12:56      Assessment/Plan 65 yo female with multiple medical comorbidities including CAD with recent admission for NSTEMI and DKA. She now presents with lethargy and hypotension, with concerns for a small bowel obstruction. I personally reviewed her CT scan. Evaluation of the small bowel is somewhat limited without IV contrast, but there is some mild small bowel dilation with fecalization of the distal small bowel. There is no pneumatosis, free air, or large volume free fluid. On exam the patient is nondistended and has no peritoneal signs. The presence of fecalization suggests a chronic component, and she has only had one episode of emesis this morning.  Her small bowel dilation is not very impressive on imaging, and I am not convinced this is driving her hemodynamic instability. Without peritonitis on exam or signs of ischemia on imaging, I would not recommend surgical intervention. Any abdominal surgery would be high risk for this patient given her medical history, and I discussed this with the patient's daughter. Would recommend continued workup for other sources of sepsis. Place a 16 or 18 Fr NG tube for decompression, and convert meds to IV if possible. Surgery will continue to follow.  Michaelle Birks, MD [facility]  Surgery General, Hepatobiliary and Pancreatic Surgery 2020/12/22 8:20 PM

## 2021-01-08 NOTE — H&P (Signed)
NAME:  [name], MRN:  [id number], DOB:  [birth year], LOS: 0 ADMISSION DATE:  01-11-2021, CONSULTATION DATE:  6/8 REFERRING MD:  Dr. Regenia Skeeter, CHIEF COMPLAINT:  Weakness and abdominal pain  History of Present Illness:  H&P per chart review and daughter [name] is an 65 y.o. who presented to [facility] today with complaints of abdominal pain and weakness.  Her PMX is as below. She was recently discharged on 6/4 after an NSTEMI. Per her daughter she was not eating post discharge and tolerating minimal po intake. He mental status continued to worsen over the past few days. She was brought today to [facility] by EMS after her mental status continued to worsen.  PCCM was consulted for admission.  Pertinent  Medical History  CAD, Carotid artery stenosis, HTN, HLD, DM2, NSTEMI (12/10/2020), Severe PAD (S/P left iliac and femoral endarterectomy in 2018, right fem-pop in 02/2017, thrombolysis and angioplasty og fem-pop bypass in 08/2020), Stage IV CKD.  Significant Hospital Events: Including procedures, antibiotic start and stop dates in addition to other pertinent events   . Admit 6/2-6/4 For NSTEMI, DKA, Anemia with heme positive stools. Plavix stopped by cardiology.  . 6/8 Presented to [facility] with weakness and abdominal pain. PCCM consulted.  Interim History / Subjective:  As above  Unable to obtain subjective evaluation due to patient status  Objective   Blood pressure (!) 82/63, pulse (!) 47, temperature 97.9 F (36.6 C), temperature source Oral, resp. rate (!) 21, SpO2 93 %.        Intake/Output Summary (Last 24 hours) at 2021-01-11 1947 Last data filed at 11-Jan-2021 1907 Gross per 24 hour  Intake 1700 ml  Output --  Net 1700 ml   There were no vitals filed for this visit.  Examination: General:  Ill appearing, in bed, restless HEENT: MM dry, anicteric, atraumatic Neuro: eyes open to voice, confused, MAE, PERRL 40mm CV: S1S2, SB on monitor, no m/r/g appreciated PULM:  Clear in  the upper lobes andin the lower lobes, chest expansion symmetric,  trachea midline  GI: soft, bsx4 active/hypoactive   Extremities: hands and feet cool, dry, no pretibial edema, capillary refill greater than 3 seconds  Skin: blackened toes on rt foot, no other rashes or lesions appreciated.    Labs/imaging that I havepersonally reviewed  (right click and "Reselect all SmartList Selections" daily)  CBC BMP Troponin 12 lead- no st changes CT abdomen- dilated loops of small bowel, calcified aorta CXR- no pneumo of effusion   Resolved Hospital Problem list     Assessment & Plan:  Hypovolemic Shock- ?Septic shock component Lactic Acidosis- Lactate 4.6 S/P 1.5 L NS, 1L LR, New AKI. Per daughter has not been eating since Sunday 6/5, able to tolerate some liquids. VBG 7.26/28/125/12.7. -Give additional liter of LR -Goal MAP 65 or greater. After fluid recitation complete, start peripheral levophed. Titrate to MAP goal. -Continue broad spectrum antibiotics. Vanc/Cefepime. Follow up BC/Urine Culture. Narrow as cultures result -Trend lactate -Obtain procalcitonin -Continue bicarb GTT.  Acute Metabolic Encephalopathy Secondary to shock -intervention as above  -Continue to monitor neuro exam   Acute Kidney Injury on CKD Stage 4 Creatine 2.01, 1.3 on 6/4, Suspect prerenal. -Place foley catheter -Fluid resuscitation as discussed -Ensure renal perfusion. Goal MAP 65 or greater. -Avoid neprotoxic drugs as possible. -Strict I&O's -Follow up AM creatinine  Small Bowel Obstruction As seen on 6/8 CT scan -General Surgery consulted. Dr. Zenia Resides does not plan operative intervention overnight. -NGT for decompression.  Continue to LIS.  CAD HX NSTEMI (CABG in 2004) Troponin- 1541, lower than previous admission. Bradycardic on exam, no obvious ST changes on 12 lead, Per daughter HR runs in 60's at home. -Cardiology consulted -Holding plavix per discharge summary -Continue ASA 81 -Trend  troponin -No chest compressions or defibrillation per [place] conversation with Daughter   DM2 -Start SSI -Blood Glucose goal 140-180.  PVD/Right Gangrenous Toes Hx PAD-Followed by Dr Fletcher Anon and Dr. Oneida Alar outpatient -Fluid administration as discussed above -ASA as above. Holding plavix -Will place wound care orders for toes -Continue to monitor perfusion in LE  HTN -Hold home antihypertensives in the setting of shock  HX Anemia Hgb 10.1, HCT 29.3 -Transfuse PRBC if HBG less than 7 -Obtain AM CBC to trend H&H  Hx Hypothyroidism -Resume home synthroid in AM  Active Smoker -Smoking cessation when appropriate  GOC Conversation Discussed goals of care with daughter Thermon Leyland. Discussed recent hospital admissions and what the patient would want for her care. Hoyle Sauer stated that her mother would not want chest compressions if her heart were to stop and that she would not want electricity to be used if her heart went into an arrhythmia. She would want intubation at this point in her hospital course.  -NO CPR and Defibrillation orders entered.  Best practice (right click and "Reselect all SmartList Selections" daily)  Diet:  NPO Pain/Anxiety/Delirium protocol (if indicated): No VAP protocol (if indicated): Not indicated DVT prophylaxis: Subcutaneous Heparin GI prophylaxis: PPI Glucose control:  SSI No Central venous access:  N/A Arterial line:  N/A Foley:  Yes, and it is still needed Mobility:  bed rest  PT consulted: N/A Last date of multidisciplinary goals of care discussion [See above] Code Status:  limited Disposition: ICU  Labs   CBC: Recent Labs  Lab 12/10/20 1815 12/11/20 0454 12/12/20 0116 12/20/2020 1545 12-20-20 1722  WBC 27.6* 25.5* 18.0* 14.1*  --   NEUTROABS 23.4*  --   --  12.9*  --   HGB 11.7* 11.2* 10.1* 8.6* 7.8*  HCT 37.5 32.7* 29.3* 25.9* 23.0*  MCV 93.8 86.1 83.5 86.0  --   PLT 364 358 276 305  --     Basic Metabolic Panel: Recent Labs  Lab  12/11/20 0454 12/11/20 0656 12/11/20 1203 12/11/20 1639 12/12/20 0116 12/12/20 0628 2020/12/20 1545 2020-12-20 1722  NA 131*   < > 132* 133* 133* 135 131* 131*  K 3.1*   < > 5.5* 4.4 3.6 3.6 4.9 4.6  CL 100   < > 100 103 105 107 99  --   CO2 22   < > 22 23 21* 24 16*  --   GLUCOSE 161*   < > 125* 221* 141* 85 121*  --   BUN 33*   < > 32* 27* 23 21 44*  --   CREATININE 1.27*   < > 1.39* 1.36* 1.27* 1.30* 2.01*  --   CALCIUM 7.9*   < > 7.9* 7.9* 7.8* 8.1* 7.4*  --   MG 1.8  --   --   --   --   --   --   --   PHOS 1.8*  --   --   --   --   --   --   --    < > = values in this interval not displayed.   GFR: Estimated Creatinine Clearance: 23.8 mL/min (A) (by C-G formula based on SCr of 2.01 mg/dL (H)). Recent Labs  Lab 12/10/20  1815 12/11/20 0454 12/12/20 0116 December 18, 2020 1545 12/18/2020 1706  WBC 27.6* 25.5* 18.0* 14.1*  --   LATICACIDVEN  --   --   --   --  4.6*    Liver Function Tests: Recent Labs  Lab 12/10/20 1815 12/11/20 0454 12/12/20 0116 Dec 18, 2020 1545  AST 18 39 40 63*  ALT 13 13 14 19   ALKPHOS 60 50 56 176*  BILITOT 2.1* 0.7 0.7 1.5*  PROT 6.8 6.0* 4.8* 4.7*  ALBUMIN 3.3* 2.9* 2.1* 1.8*   Recent Labs  Lab Dec 18, 2020 1545  LIPASE 34   No results for input(s): AMMONIA in the last 168 hours.  ABG    Component Value Date/Time   PHART 7.265 (L) 18-Dec-2020 1722   PCO2ART 28.0 (L) 12-18-2020 1722   PO2ART 125 (H) 18-Dec-2020 1722   HCO3 12.7 (L) December 18, 2020 1722   TCO2 14 (L) 12/18/20 1722   ACIDBASEDEF 13.0 (H) Dec 18, 2020 1722   O2SAT 98.0 2020/12/18 1722     Coagulation Profile: Recent Labs  Lab 12/11/20 0454  INR 1.2    Cardiac Enzymes: Recent Labs  Lab December 18, 2020 1545  CKTOTAL 61    HbA1C: Hgb A1c MFr Bld  Date/Time Value Ref Range Status  12/11/2020 12:26 AM 12.5 (H) 4.8 - 5.6 % Final    Comment:    (NOTE)         Prediabetes: 5.7 - 6.4         Diabetes: >6.4         Glycemic control for adults with diabetes: <7.0   08/27/2020 04:07 AM  12.2 (H) 4.8 - 5.6 % Final    Comment:    (NOTE) Pre diabetes:          5.7%-6.4%  Diabetes:              >6.4%  Glycemic control for   <7.0% adults with diabetes     CBG: Recent Labs  Lab 12/12/20 0926 12/12/20 1135 12/12/20 1541 12/18/20 1336 12/18/2020 1723  GLUCAP 122* 107* 87 114* 105*    Review of Systems:   Unable to obtain a review of systems due to patient status  Past Medical History:  She,  has a past medical history of Adenomatous polyp of colon, Anemia, Arthritis, Asthma, Carotid artery disease ([place]), Carpal tunnel syndrome, Chronic renal insufficiency, Coronary artery disease, Depression with anxiety, Diabetes mellitus, Diverticulosis of colon (without mention of hemorrhage), GERD (gastroesophageal reflux disease), Heart murmur, History of hiatal hernia, Hyperlipidemia, Hypertension, Hypothyroidism, and PVD (peripheral vascular disease) ([place]).   Surgical History:   Past Surgical History:  Procedure Laterality Date  . ABDOMINAL AORTAGRAM N/A 12/18/2013   Procedure: ABDOMINAL [name];  Surgeon: Wellington Hampshire, MD;  Location: [place] CATH LAB;  Service: Cardiovascular;  Laterality: N/A;  . ABDOMINAL AORTOGRAM W/LOWER EXTREMITY N/A 11/16/2016   Procedure: Abdominal Aortogram w/Lower Extremity;  Surgeon: Wellington Hampshire, MD;  Location: [place] CV LAB;  Service: Cardiovascular;  Laterality: N/A;  . ABDOMINAL AORTOGRAM W/LOWER EXTREMITY N/A 01/25/2017   Procedure: Abdominal Aortogram w/Lower Extremity;  Surgeon: Wellington Hampshire, MD;  Location: [place] CV LAB;  Service: Cardiovascular;  Laterality: N/A;  only completed Lower Extremity  . ABDOMINAL AORTOGRAM W/LOWER EXTREMITY N/A 02/28/2018   Procedure: ABDOMINAL AORTOGRAM W/LOWER EXTREMITY;  Surgeon: Wellington Hampshire, MD;  Location: [place] CV LAB;  Service: Cardiovascular;  Laterality: N/A;  . ABDOMINAL AORTOGRAM W/LOWER EXTREMITY N/A 08/27/2020   Procedure: ABDOMINAL AORTOGRAM W/LOWER EXTREMITY;  Surgeon:  Marty Heck, MD;  Location: [place] CV LAB;  Service: Cardiovascular;  Laterality: N/A;  . ABDOMINAL HYSTERECTOMY    . APPENDECTOMY    . APPLICATION OF WOUND VAC Right 02/13/2017   Procedure: APPLICATION OF INCISIONAL WOUND VAC RIGHT GROIN;  Surgeon: Elam Dutch, MD;  Location: [place];  Service: Vascular;  Laterality: Right;  . CHOLECYSTECTOMY    . CORONARY ARTERY BYPASS GRAFT  2004   x4  . ENDARTERECTOMY FEMORAL Left 11/23/2016   Procedure: ENDARTERECTOMY OF LEFT EXTERNAL COMMON FEMORAL ARTERY WITH EXTENDED LEFT PROFUNDOPLASTY;  Surgeon: Elam Dutch, MD;  Location: [place];  Service: Vascular;  Laterality: Left;  . ENDARTERECTOMY FEMORAL Right 02/13/2017   Procedure: ENDARTERECTOMY RIGHT FEMORAL ARTERY WITH PROFUNDAPLASTY;  Surgeon: Elam Dutch, MD;  Location: [facility] OR;  Service: Vascular;  Laterality: Right;  . ESOPHAGOGASTRODUODENOSCOPY N/A 08/02/2017   Procedure: ESOPHAGOGASTRODUODENOSCOPY (EGD);  Surgeon: Ladene Artist, MD;  Location: [facility] ENDOSCOPY;  Service: Endoscopy;  Laterality: N/A;  . FEMORAL-POPLITEAL BYPASS GRAFT Left 11/23/2016   Procedure: LEFT FEMORAL-BELOW KNEE POPLITEAL ARTERY BYPASS;  Surgeon: Elam Dutch, MD;  Location: [place];  Service: Vascular;  Laterality: Left;  . FEMORAL-POPLITEAL BYPASS GRAFT Right 02/13/2017   Procedure: BYPASS GRAFT RIGHT FEMORAL-BELOW KNEE POPLITEAL ARTERY;  Surgeon: Elam Dutch, MD;  Location: [place];  Service: Vascular;  Laterality: Right;  . KNEE SURGERY Right 2013   arthroscopy  . LEFT HEART CATH AND CORONARY ANGIOGRAPHY N/A 12/11/2020   Procedure: LEFT HEART CATH AND CORONARY ANGIOGRAPHY;  Surgeon: Sherren Mocha, MD;  Location: [employer] CV LAB;  Service: Cardiovascular;  Laterality: N/A;  . PATCH ANGIOPLASTY Right 02/13/2017   Procedure: VEIN PATCH ANGIOPLASTY RIGHT POPLITEAL ARTERY AND HEMASHIELD PATCH ANGIOPLASTY OF RIGHT FEMORAL ARTERY;  Surgeon: Elam Dutch, MD;  Location: [place];  Service: Vascular;   Laterality: Right;  . PERIPHERAL VASCULAR BALLOON ANGIOPLASTY  02/28/2018   Procedure: PERIPHERAL VASCULAR BALLOON ANGIOPLASTY;  Surgeon: Wellington Hampshire, MD;  Location: [place] CV LAB;  Service: Cardiovascular;;  Left Fem-pop Bypass  . PERIPHERAL VASCULAR INTERVENTION Right 08/28/2020   Procedure: PERIPHERAL VASCULAR INTERVENTION;  Surgeon: Elam Dutch, MD;  Location: [place] CV LAB;  Service: Cardiovascular;  Laterality: Right;  . PERIPHERAL VASCULAR THROMBECTOMY Right 08/28/2020   Procedure: LYSIS RECHECK;  Surgeon: Elam Dutch, MD;  Location: [place] CV LAB;  Service: Cardiovascular;  Laterality: Right;     Social History:   reports that she has been smoking cigarettes. She has a 15.00 pack-year smoking history. She has never used smokeless tobacco. She reports that she does not drink alcohol and does not use drugs.   Family History:  Her family history includes Breast cancer in her daughter and mother; Colon polyps in her daughter; Diabetes in her mother; Heart disease in her father. There is no history of Colon cancer, Esophageal cancer, Stomach cancer, or Rectal cancer.   Allergies Allergies  Allergen Reactions  . Dapagliflozin     Other reaction(s): Other Syncope     Home Medications  Prior to Admission medications   Medication Sig Start Date End Date Taking? Authorizing Provider  albuterol (PROVENTIL HFA;VENTOLIN HFA) 108 (90 BASE) MCG/ACT inhaler Inhale 2 puffs into the lungs every 6 (six) hours as needed for wheezing or shortness of breath.    [provider]  ALPRAZolam Duanne Moron) 1 MG tablet Take 1 mg by mouth 3 (three) times daily as needed for anxiety. 05/11/11   [provider]  amLODipine (NORVASC) 2.5 MG tablet  Take 1 tablet (2.5 mg total) by mouth daily. 12/12/20 01/11/21  Donne Hazel, MD  aspirin EC 81 MG tablet Take 81 mg by mouth at bedtime.    [provider]  atorvastatin (LIPITOR) 80 MG tablet Take 80 mg by mouth  daily.     [provider]  calcitRIOL (ROCALTROL) 0.25 MCG capsule Take 0.25 mcg by mouth every Monday, Wednesday, and Friday.  07/02/17   [provider]  carvedilol (COREG) 6.25 MG tablet Take 1 tablet (6.25 mg total) by mouth 2 (two) times daily with a meal. 12/12/20 01/11/21  Donne Hazel, MD  cetirizine (ZYRTEC) 10 MG tablet Take 10 mg by mouth daily. 06/30/17   [provider]  fenofibrate micronized (LOFIBRA) 200 MG capsule Take 200 mg by mouth daily.    [provider]  ferrous sulfate 325 (65 FE) MG tablet Take 325 mg by mouth 3 (three) times daily. 05/10/15   [provider]  Insulin Glargine (BASAGLAR KWIKPEN) 100 UNIT/ML SOPN Inject 20-60 Units into the skin 2 (two) times daily before a meal. Per sliding scale 01/26/17   [provider]  levothyroxine (SYNTHROID) 112 MCG tablet Take 1 tablet by mouth daily. 10/30/20   [provider]  Omega-3 Fatty Acids (FISH OIL) 1000 MG CAPS Take 1,000 mg by mouth 2 (two) times daily.    [provider]  pantoprazole (PROTONIX) 40 MG tablet Take 1 tablet (40 mg total) by mouth daily. 12/12/20 12/12/21  Donne Hazel, MD  PARoxetine (PAXIL) 40 MG tablet Take 60 mg by mouth at bedtime.     [provider]  promethazine (PHENERGAN) 25 MG tablet Take 25 mg by mouth daily as needed for nausea or vomiting.  08/06/16   [provider]  triamcinolone cream (KENALOG) 0.1 % Apply 1 application topically 2 (two) times daily. 11/27/20   [provider]  Vitamin D, Ergocalciferol, (DRISDOL) 50000 UNITS CAPS capsule Take 50,000 Units by mouth 2 (two) times a week. Takes on Mondays and Fridays    [provider]     Critical care time: Coffey Rainey Rodger, Jr., MSN, APRN, AGACNP-BC [facility]  01/11/2021 , 7:47 PM  Please see [email or web address] for pager details  If no response, please call [phone] After hours, please call  Elink at [phone]

## 2021-01-08 DEATH — deceased

## 2021-01-21 ENCOUNTER — Encounter (HOSPITAL_COMMUNITY): Payer: Medicare Other

## 2021-01-21 ENCOUNTER — Other Ambulatory Visit (HOSPITAL_COMMUNITY): Payer: Medicare Other

## 2021-01-21 ENCOUNTER — Ambulatory Visit: Payer: Medicare Other

## 2022-08-25 IMAGING — DX DG ANKLE COMPLETE 3+V*R*
3 series · 3 of 3 positions shown · non-contrast
Comparison: None.

CLINICAL DATA: Recent fall.  Pain

EXAM:
RIGHT ANKLE - COMPLETE 3+ VIEW

[ankle ap]
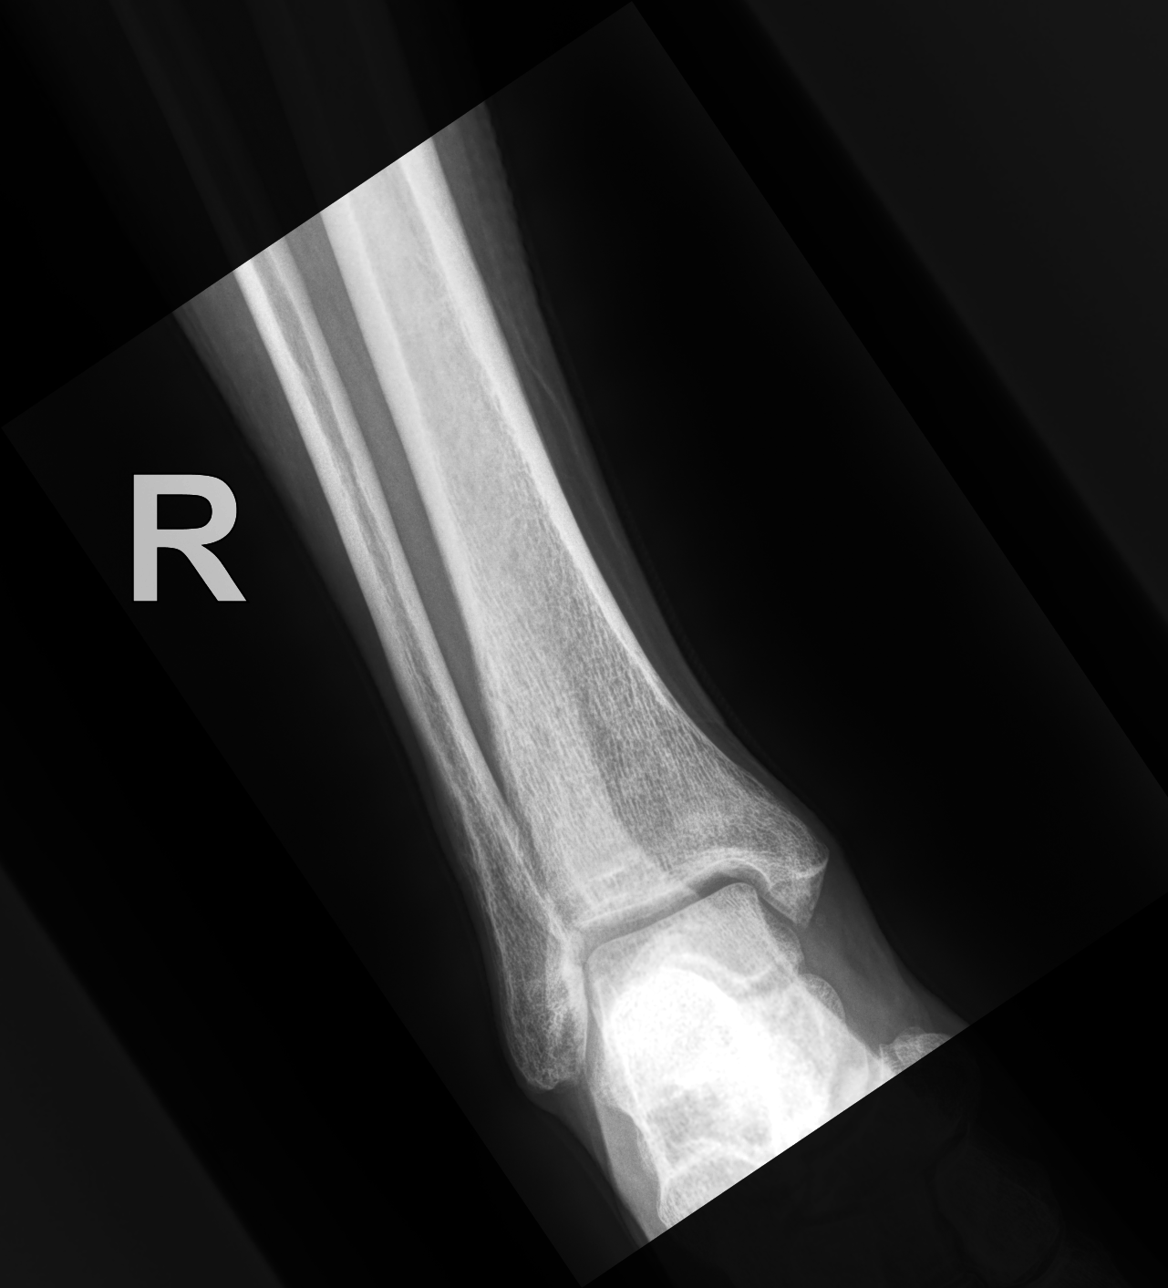

[ankle mlo]
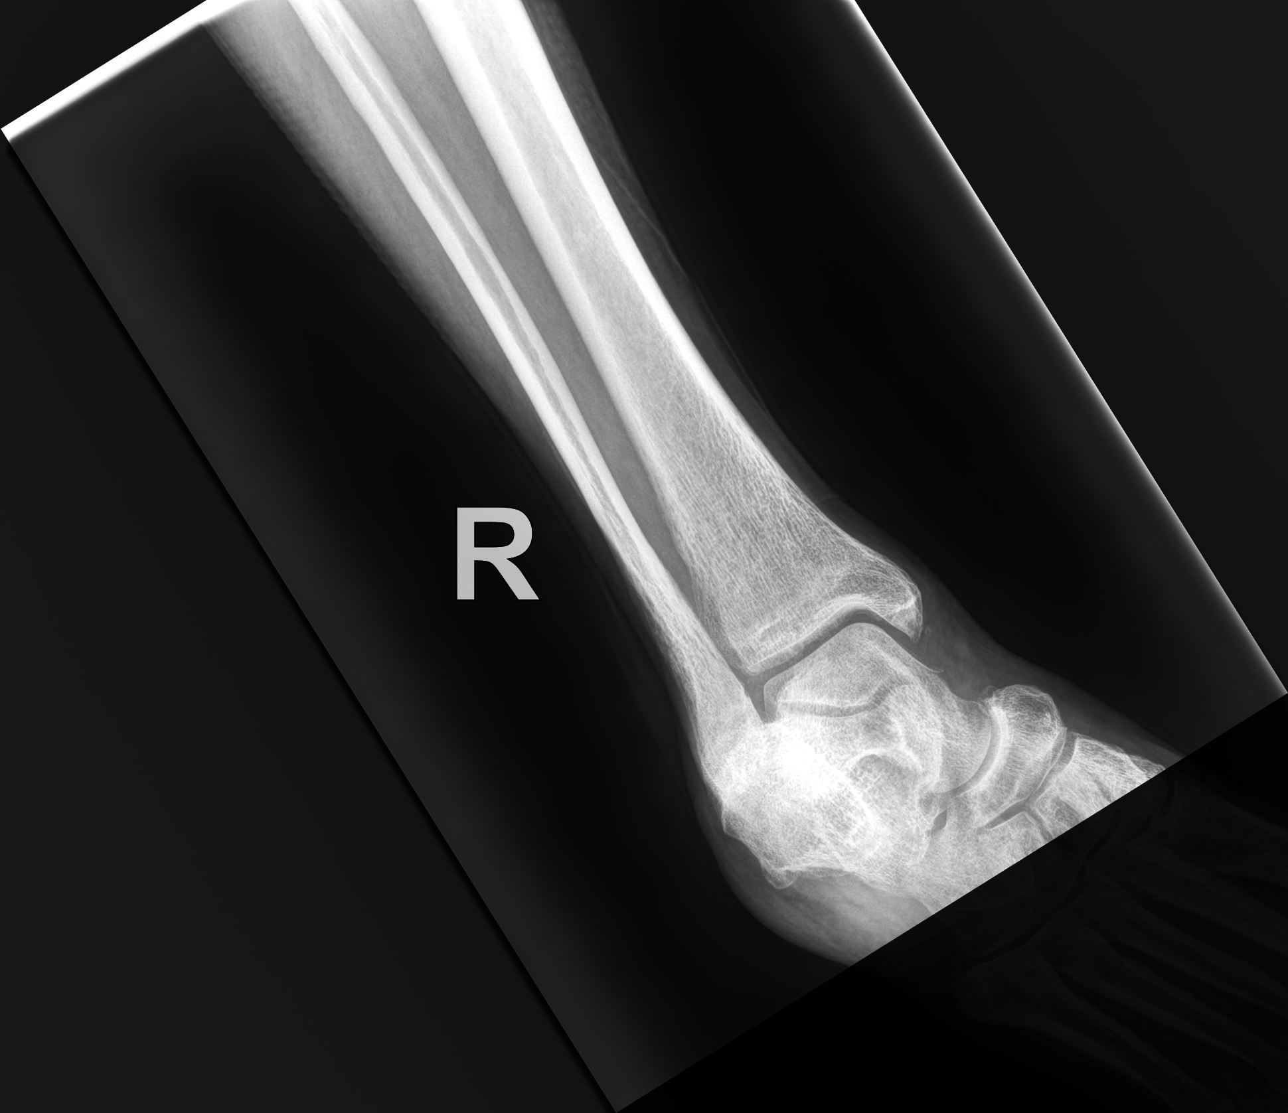

[ankle lat]
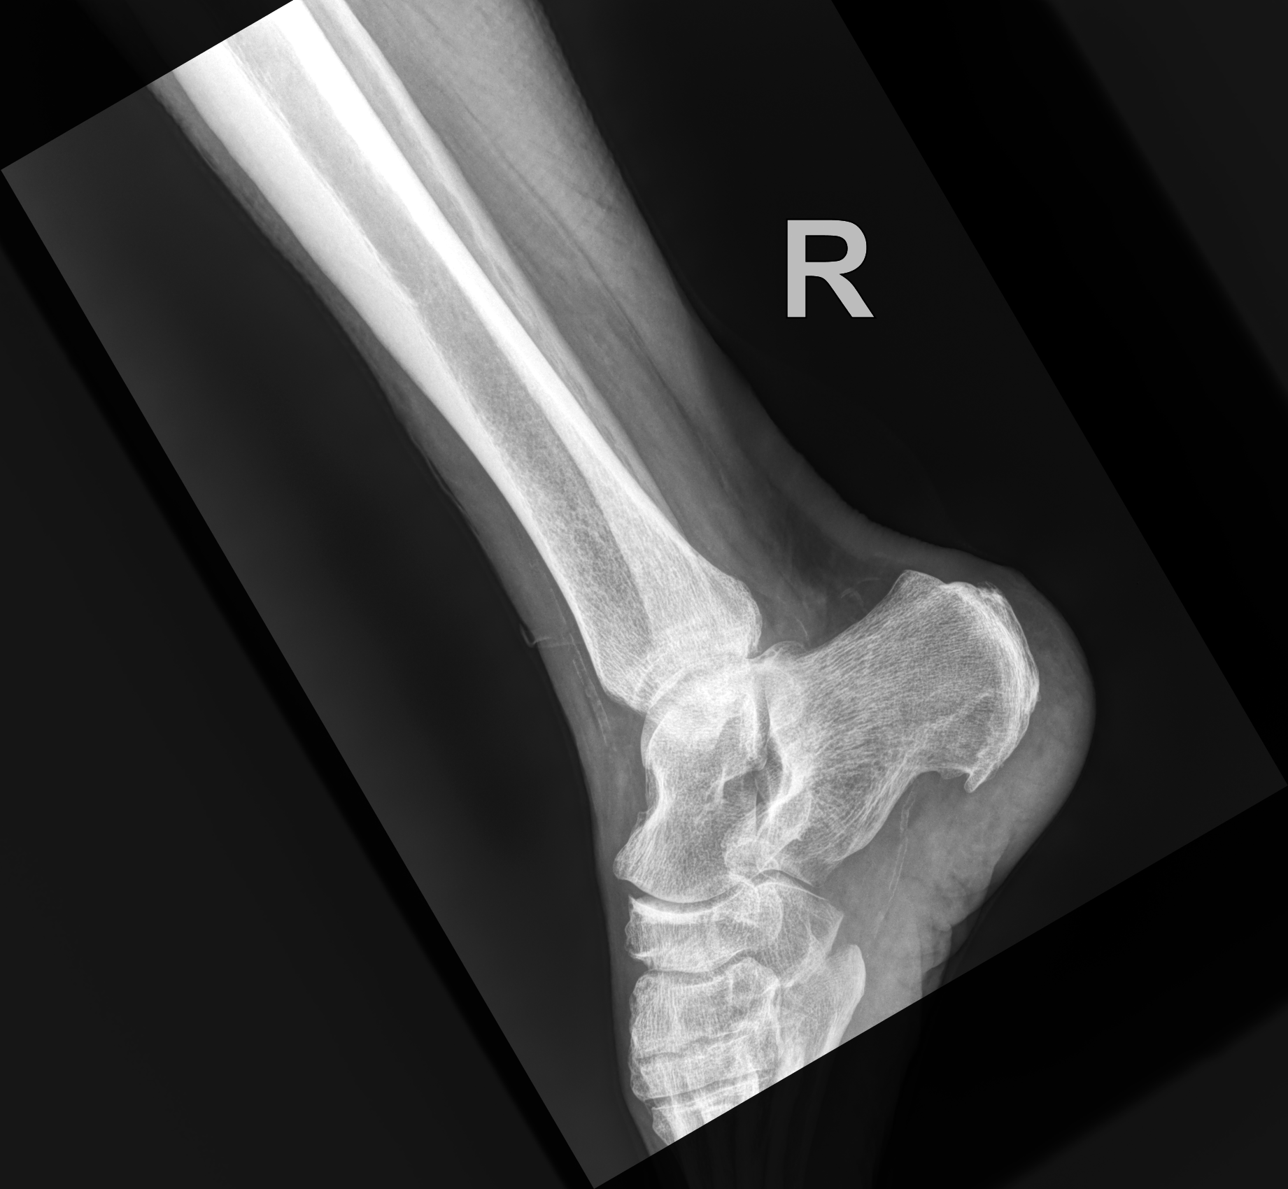

[3 of 3 positions shown; findings below may reference images not displayed]

FINDINGS: Normal alignment no fracture. Mild degenerative change in the
midfoot. Mild calcaneal spurring. Arterial calcification.
IMPRESSION: Negative for fracture.
